# Patient Record
Sex: Male | Born: 1940 | Race: Black or African American | Hispanic: No | Smoking: Never smoker
Health system: Southern US, Community
[De-identification: ages and names within clinical notes are randomized; demographics above are authoritative.]

## PROBLEM LIST (undated history)

## (undated) DIAGNOSIS — M109 Gout, unspecified: Secondary | ICD-10-CM

## (undated) DIAGNOSIS — D649 Anemia, unspecified: Secondary | ICD-10-CM

## (undated) DIAGNOSIS — E785 Hyperlipidemia, unspecified: Secondary | ICD-10-CM

## (undated) DIAGNOSIS — F329 Major depressive disorder, single episode, unspecified: Secondary | ICD-10-CM

## (undated) DIAGNOSIS — K649 Unspecified hemorrhoids: Secondary | ICD-10-CM

## (undated) DIAGNOSIS — R9439 Abnormal result of other cardiovascular function study: Secondary | ICD-10-CM

## (undated) DIAGNOSIS — J069 Acute upper respiratory infection, unspecified: Secondary | ICD-10-CM

## (undated) DIAGNOSIS — M5412 Radiculopathy, cervical region: Secondary | ICD-10-CM

## (undated) DIAGNOSIS — R7302 Impaired glucose tolerance (oral): Secondary | ICD-10-CM

## (undated) DIAGNOSIS — F528 Other sexual dysfunction not due to a substance or known physiological condition: Secondary | ICD-10-CM

## (undated) DIAGNOSIS — Z86010 Personal history of colonic polyps: Secondary | ICD-10-CM

## (undated) DIAGNOSIS — G5600 Carpal tunnel syndrome, unspecified upper limb: Secondary | ICD-10-CM

## (undated) DIAGNOSIS — E538 Deficiency of other specified B group vitamins: Secondary | ICD-10-CM

## (undated) DIAGNOSIS — E876 Hypokalemia: Secondary | ICD-10-CM

## (undated) DIAGNOSIS — K219 Gastro-esophageal reflux disease without esophagitis: Secondary | ICD-10-CM

## (undated) DIAGNOSIS — J309 Allergic rhinitis, unspecified: Secondary | ICD-10-CM

## (undated) DIAGNOSIS — R0989 Other specified symptoms and signs involving the circulatory and respiratory systems: Secondary | ICD-10-CM

## (undated) DIAGNOSIS — R0602 Shortness of breath: Secondary | ICD-10-CM

## (undated) DIAGNOSIS — R079 Chest pain, unspecified: Secondary | ICD-10-CM

## (undated) DIAGNOSIS — M545 Low back pain: Secondary | ICD-10-CM

## (undated) DIAGNOSIS — I2510 Atherosclerotic heart disease of native coronary artery without angina pectoris: Secondary | ICD-10-CM

## (undated) DIAGNOSIS — I10 Essential (primary) hypertension: Secondary | ICD-10-CM

## (undated) DIAGNOSIS — M25519 Pain in unspecified shoulder: Secondary | ICD-10-CM

## (undated) DIAGNOSIS — E739 Lactose intolerance, unspecified: Secondary | ICD-10-CM

## (undated) DIAGNOSIS — R209 Unspecified disturbances of skin sensation: Secondary | ICD-10-CM

## (undated) DIAGNOSIS — N400 Enlarged prostate without lower urinary tract symptoms: Principal | ICD-10-CM

## (undated) HISTORY — DX: Deficiency of other specified B group vitamins: E53.8

## (undated) HISTORY — DX: Atherosclerotic heart disease of native coronary artery without angina pectoris: I25.10

## (undated) HISTORY — DX: Allergic rhinitis, unspecified: J30.9

## (undated) HISTORY — DX: Hyperlipidemia, unspecified: E78.5

## (undated) HISTORY — DX: Radiculopathy, cervical region: M54.12

## (undated) HISTORY — DX: Other sexual dysfunction not due to a substance or known physiological condition: F52.8

## (undated) HISTORY — PX: CORONARY STENT PLACEMENT: SHX1402

## (undated) HISTORY — PX: COLONOSCOPY: SHX174

## (undated) HISTORY — DX: Unspecified disturbances of skin sensation: R20.9

## (undated) HISTORY — DX: Hypokalemia: E87.6

## (undated) HISTORY — DX: Unspecified hemorrhoids: K64.9

## (undated) HISTORY — DX: Personal history of colonic polyps: Z86.010

## (undated) HISTORY — DX: Lactose intolerance, unspecified: E73.9

## (undated) HISTORY — DX: Gastro-esophageal reflux disease without esophagitis: K21.9

## (undated) HISTORY — DX: Pain in unspecified shoulder: M25.519

## (undated) HISTORY — DX: Low back pain: M54.5

## (undated) HISTORY — DX: Essential (primary) hypertension: I10

## (undated) HISTORY — DX: Gout, unspecified: M10.9

## (undated) HISTORY — DX: Acute upper respiratory infection, unspecified: J06.9

## (undated) HISTORY — DX: Shortness of breath: R06.02

## (undated) HISTORY — DX: Chest pain, unspecified: R07.9

## (undated) HISTORY — DX: Benign prostatic hyperplasia without lower urinary tract symptoms: N40.0

## (undated) HISTORY — DX: Major depressive disorder, single episode, unspecified: F32.9

## (undated) HISTORY — DX: Impaired glucose tolerance (oral): R73.02

## (undated) HISTORY — DX: Carpal tunnel syndrome, unspecified upper limb: G56.00

## (undated) HISTORY — DX: Anemia, unspecified: D64.9

## (undated) HISTORY — DX: Other specified symptoms and signs involving the circulatory and respiratory systems: R09.89

## (undated) HISTORY — DX: Abnormal result of other cardiovascular function study: R94.39

---

## 2004-05-12 ENCOUNTER — Other Ambulatory Visit: Payer: Self-pay

## 2005-05-05 ENCOUNTER — Ambulatory Visit: Payer: Self-pay | Admitting: Internal Medicine

## 2005-08-26 ENCOUNTER — Ambulatory Visit: Payer: Self-pay | Admitting: Internal Medicine

## 2005-12-10 ENCOUNTER — Ambulatory Visit: Payer: Self-pay | Admitting: Internal Medicine

## 2006-03-09 ENCOUNTER — Ambulatory Visit: Payer: Self-pay | Admitting: Internal Medicine

## 2006-09-23 ENCOUNTER — Ambulatory Visit: Payer: Self-pay | Admitting: Internal Medicine

## 2006-09-23 LAB — CONVERTED CEMR LAB
ALT: 22 U/L
AST: 24 U/L
Albumin: 3.6 g/dL
Alkaline Phosphatase: 63 U/L
BUN: 12 mg/dL
Basophils Absolute: 0.1 K/uL
Basophils Relative: 0.9 %
Bilirubin Urine: NEGATIVE
CO2: 31 meq/L
Calcium: 9.7 mg/dL
Chloride: 101 meq/L
Chol/HDL Ratio, serum: 6.6
Cholesterol: 248 mg/dL
Creatinine, Ser: 1.1 mg/dL
Crystals: NEGATIVE
Eosinophil percent: 2.3 %
GFR calc non Af Amer: 72 mL/min
Glomerular Filtration Rate, Af Am: 87 mL/min/1.73m2
Glucose, Bld: 90 mg/dL
HCT: 39.3 %
HDL: 37.5 mg/dL — ABNORMAL LOW
Hemoglobin, Urine: NEGATIVE
Hemoglobin: 13 g/dL
Ketones, ur: NEGATIVE mg/dL
LDL DIRECT: 123.4 mg/dL
Lymphocytes Relative: 42.2 %
MCHC: 33.1 g/dL
MCV: 90.1 fL
Monocytes Absolute: 0.5 K/uL
Monocytes Relative: 7.9 %
Mucus, UA: NEGATIVE
Neutro Abs: 2.8 K/uL
Neutrophils Relative %: 46.7 %
Nitrite: NEGATIVE
PSA: 0.35 ng/mL
Platelets: 234 K/uL
Potassium: 3.3 meq/L — ABNORMAL LOW
RBC: 4.36 M/uL
RDW: 12.8 %
Sodium: 139 meq/L
Specific Gravity, Urine: 1.025
TSH: 1.13 u[IU]/mL
Total Bilirubin: 1 mg/dL
Total Protein, Urine: NEGATIVE mg/dL
Total Protein: 7.3 g/dL
Triglyceride fasting, serum: 334 mg/dL
Urine Glucose: NEGATIVE mg/dL
Urobilinogen, UA: 0.2
VLDL: 67 mg/dL — ABNORMAL HIGH
WBC: 6.1 10*3/microliter
pH: 6

## 2006-11-29 ENCOUNTER — Ambulatory Visit: Payer: Self-pay | Admitting: Pulmonary Disease

## 2007-06-09 ENCOUNTER — Encounter: Payer: Self-pay | Admitting: Internal Medicine

## 2007-06-09 DIAGNOSIS — K649 Unspecified hemorrhoids: Secondary | ICD-10-CM | POA: Insufficient documentation

## 2007-06-09 DIAGNOSIS — I10 Essential (primary) hypertension: Secondary | ICD-10-CM

## 2007-06-09 DIAGNOSIS — K219 Gastro-esophageal reflux disease without esophagitis: Secondary | ICD-10-CM

## 2007-06-09 HISTORY — DX: Gastro-esophageal reflux disease without esophagitis: K21.9

## 2007-06-09 HISTORY — DX: Unspecified hemorrhoids: K64.9

## 2007-06-09 HISTORY — DX: Essential (primary) hypertension: I10

## 2007-06-12 DIAGNOSIS — M545 Low back pain, unspecified: Secondary | ICD-10-CM | POA: Insufficient documentation

## 2007-06-12 DIAGNOSIS — F329 Major depressive disorder, single episode, unspecified: Secondary | ICD-10-CM | POA: Insufficient documentation

## 2007-06-12 DIAGNOSIS — F3289 Other specified depressive episodes: Secondary | ICD-10-CM

## 2007-06-12 HISTORY — DX: Major depressive disorder, single episode, unspecified: F32.9

## 2007-06-12 HISTORY — DX: Other specified depressive episodes: F32.89

## 2007-06-12 HISTORY — DX: Low back pain, unspecified: M54.50

## 2008-02-07 ENCOUNTER — Ambulatory Visit: Payer: Self-pay | Admitting: Internal Medicine

## 2008-02-07 DIAGNOSIS — E785 Hyperlipidemia, unspecified: Secondary | ICD-10-CM

## 2008-02-07 HISTORY — DX: Hyperlipidemia, unspecified: E78.5

## 2008-02-07 LAB — CONVERTED CEMR LAB
ALT: 14 units/L (ref 0–53)
AST: 19 units/L (ref 0–37)
Albumin: 3.8 g/dL (ref 3.5–5.2)
Alkaline Phosphatase: 69 units/L (ref 39–117)
BUN: 13 mg/dL (ref 6–23)
CO2: 33 meq/L — ABNORMAL HIGH (ref 19–32)
Chloride: 105 meq/L (ref 96–112)
Eosinophils Absolute: 0.1 10*3/uL (ref 0.0–0.7)
Eosinophils Relative: 2.4 % (ref 0.0–5.0)
GFR calc non Af Amer: 79 mL/min
HDL: 23.7 mg/dL — ABNORMAL LOW (ref >39.0)
Leukocytes, UA: NEGATIVE
Lymphocytes Relative: 40.1 % (ref 12.0–46.0)
MCV: 89.2 fL (ref 78.0–100.0)
Monocytes Relative: 10.2 % (ref 3.0–12.0)
Neutrophils Relative %: 47 % (ref 43.0–77.0)
Nitrite: NEGATIVE
Platelets: 223 10*3/uL (ref 150–400)
Potassium: 3.6 meq/L (ref 3.5–5.1)
RBC: 4.4 M/uL (ref 4.22–5.81)
Specific Gravity, Urine: 1.02 (ref 1.000–1.03)
Total CHOL/HDL Ratio: 10.5
Urobilinogen, UA: 0.2 (ref 0.0–1.0)
VLDL: 36 mg/dL (ref 0–40)
WBC: 6.1 10*3/uL (ref 4.5–10.5)

## 2008-04-25 ENCOUNTER — Ambulatory Visit: Payer: Self-pay | Admitting: Internal Medicine

## 2008-04-25 DIAGNOSIS — R209 Unspecified disturbances of skin sensation: Secondary | ICD-10-CM

## 2008-04-25 DIAGNOSIS — M25519 Pain in unspecified shoulder: Secondary | ICD-10-CM

## 2008-04-25 HISTORY — DX: Unspecified disturbances of skin sensation: R20.9

## 2008-04-25 HISTORY — DX: Pain in unspecified shoulder: M25.519

## 2008-05-08 ENCOUNTER — Encounter: Payer: Self-pay | Admitting: Internal Medicine

## 2008-05-20 ENCOUNTER — Encounter: Payer: Self-pay | Admitting: Internal Medicine

## 2008-05-20 DIAGNOSIS — G5600 Carpal tunnel syndrome, unspecified upper limb: Secondary | ICD-10-CM | POA: Insufficient documentation

## 2008-05-20 HISTORY — DX: Carpal tunnel syndrome, unspecified upper limb: G56.00

## 2008-08-19 ENCOUNTER — Ambulatory Visit: Payer: Self-pay | Admitting: Internal Medicine

## 2008-08-19 DIAGNOSIS — J069 Acute upper respiratory infection, unspecified: Secondary | ICD-10-CM

## 2008-08-19 HISTORY — DX: Acute upper respiratory infection, unspecified: J06.9

## 2008-11-23 ENCOUNTER — Ambulatory Visit: Payer: Self-pay | Admitting: Family Medicine

## 2008-12-04 ENCOUNTER — Telehealth (INDEPENDENT_AMBULATORY_CARE_PROVIDER_SITE_OTHER): Payer: Self-pay | Admitting: *Deleted

## 2009-02-21 ENCOUNTER — Emergency Department: Payer: Self-pay | Admitting: Emergency Medicine

## 2009-02-24 ENCOUNTER — Ambulatory Visit: Payer: Self-pay | Admitting: Internal Medicine

## 2009-02-24 DIAGNOSIS — F528 Other sexual dysfunction not due to a substance or known physiological condition: Secondary | ICD-10-CM

## 2009-02-24 HISTORY — DX: Other sexual dysfunction not due to a substance or known physiological condition: F52.8

## 2009-02-25 LAB — CONVERTED CEMR LAB
ALT: 17 units/L (ref 0–53)
AST: 21 units/L (ref 0–37)
Bilirubin, Direct: 0.1 mg/dL (ref 0.0–0.3)
Direct LDL: 162.5 mg/dL
Total Bilirubin: 0.7 mg/dL (ref 0.3–1.2)

## 2009-02-28 ENCOUNTER — Emergency Department: Payer: Self-pay | Admitting: Emergency Medicine

## 2009-08-28 ENCOUNTER — Ambulatory Visit: Payer: Self-pay | Admitting: Internal Medicine

## 2009-08-29 LAB — CONVERTED CEMR LAB
Albumin: 4 g/dL (ref 3.5–5.2)
BUN: 9 mg/dL (ref 6–23)
Basophils Relative: 1.3 % (ref 0.0–3.0)
Bilirubin, Direct: 0 mg/dL (ref 0.0–0.3)
CO2: 32 meq/L (ref 19–32)
Chloride: 101 meq/L (ref 96–112)
Cholesterol: 255 mg/dL — ABNORMAL HIGH (ref 0–200)
Creatinine, Ser: 0.9 mg/dL (ref 0.4–1.5)
Direct LDL: 175.1 mg/dL
Eosinophils Absolute: 0.2 10*3/uL (ref 0.0–0.7)
HDL: 35.3 mg/dL — ABNORMAL LOW (ref >39.00)
Ketones, ur: NEGATIVE mg/dL
Lymphs Abs: 2.7 10*3/uL (ref 0.7–4.0)
MCHC: 33.5 g/dL (ref 30.0–36.0)
MCV: 92.1 fL (ref 78.0–100.0)
Monocytes Absolute: 0.4 10*3/uL (ref 0.1–1.0)
Neutrophils Relative %: 44 % (ref 43.0–77.0)
PSA: 0.53 ng/mL (ref 0.10–4.00)
RBC: 4.07 M/uL — ABNORMAL LOW (ref 4.22–5.81)
Specific Gravity, Urine: <=1.005 (ref 1.000–1.030)
Total Protein, Urine: NEGATIVE mg/dL
Total Protein: 7.6 g/dL (ref 6.0–8.3)
Triglycerides: 192 mg/dL — ABNORMAL HIGH (ref 0.0–149.0)
Urine Glucose: NEGATIVE mg/dL
pH: 6 (ref 5.0–8.0)

## 2009-09-26 ENCOUNTER — Ambulatory Visit: Payer: Self-pay | Admitting: Internal Medicine

## 2009-09-26 DIAGNOSIS — R079 Chest pain, unspecified: Secondary | ICD-10-CM

## 2009-09-26 HISTORY — DX: Chest pain, unspecified: R07.9

## 2009-10-06 ENCOUNTER — Telehealth (INDEPENDENT_AMBULATORY_CARE_PROVIDER_SITE_OTHER): Payer: Self-pay | Admitting: *Deleted

## 2009-10-07 ENCOUNTER — Ambulatory Visit: Payer: Self-pay

## 2009-10-07 ENCOUNTER — Ambulatory Visit: Payer: Self-pay | Admitting: Cardiovascular Disease

## 2009-10-07 ENCOUNTER — Encounter (HOSPITAL_COMMUNITY): Admission: RE | Admit: 2009-10-07 | Discharge: 2009-12-15 | Payer: Self-pay | Admitting: Internal Medicine

## 2009-10-08 ENCOUNTER — Telehealth: Payer: Self-pay | Admitting: Internal Medicine

## 2009-10-08 ENCOUNTER — Encounter: Payer: Self-pay | Admitting: Internal Medicine

## 2009-10-08 DIAGNOSIS — R9439 Abnormal result of other cardiovascular function study: Secondary | ICD-10-CM

## 2009-10-08 HISTORY — DX: Abnormal result of other cardiovascular function study: R94.39

## 2009-10-13 ENCOUNTER — Encounter (INDEPENDENT_AMBULATORY_CARE_PROVIDER_SITE_OTHER): Payer: Self-pay | Admitting: *Deleted

## 2009-10-23 ENCOUNTER — Encounter: Payer: Self-pay | Admitting: Cardiology

## 2009-10-23 ENCOUNTER — Ambulatory Visit: Payer: Self-pay | Admitting: Cardiovascular Disease

## 2009-10-24 LAB — CONVERTED CEMR LAB
Basophils Absolute: 0 10*3/uL (ref 0.0–0.1)
Basophils Relative: 0.4 % (ref 0.0–3.0)
CO2: 32 meq/L (ref 19–32)
Chloride: 99 meq/L (ref 96–112)
Eosinophils Absolute: 0.1 10*3/uL (ref 0.0–0.7)
Glucose, Bld: 112 mg/dL — ABNORMAL HIGH (ref 70–99)
HCT: 36.5 % — ABNORMAL LOW (ref 39.0–52.0)
Hemoglobin: 11.8 g/dL — ABNORMAL LOW (ref 13.0–17.0)
Lymphs Abs: 2.1 10*3/uL (ref 0.7–4.0)
MCHC: 32.3 g/dL (ref 30.0–36.0)
Monocytes Relative: 7.1 % (ref 3.0–12.0)
Neutro Abs: 2.9 10*3/uL (ref 1.4–7.7)
Potassium: 3.1 meq/L — ABNORMAL LOW (ref 3.5–5.1)
RBC: 3.93 M/uL — ABNORMAL LOW (ref 4.22–5.81)
RDW: 13.4 % (ref 11.5–14.6)
Sodium: 138 meq/L (ref 135–145)

## 2009-10-30 ENCOUNTER — Ambulatory Visit: Payer: Self-pay | Admitting: Cardiovascular Disease

## 2009-10-30 ENCOUNTER — Inpatient Hospital Stay (HOSPITAL_COMMUNITY): Admission: AD | Admit: 2009-10-30 | Discharge: 2009-10-31 | Payer: Self-pay | Admitting: Cardiovascular Disease

## 2009-11-04 ENCOUNTER — Telehealth: Payer: Self-pay | Admitting: Cardiovascular Disease

## 2009-11-18 ENCOUNTER — Ambulatory Visit: Payer: Self-pay | Admitting: Cardiovascular Disease

## 2009-11-18 DIAGNOSIS — R0989 Other specified symptoms and signs involving the circulatory and respiratory systems: Secondary | ICD-10-CM | POA: Insufficient documentation

## 2009-11-18 HISTORY — DX: Other specified symptoms and signs involving the circulatory and respiratory systems: R09.89

## 2009-11-20 ENCOUNTER — Ambulatory Visit: Payer: Self-pay

## 2009-11-20 ENCOUNTER — Encounter: Payer: Self-pay | Admitting: Cardiovascular Disease

## 2009-12-11 ENCOUNTER — Telehealth: Payer: Self-pay | Admitting: Cardiovascular Disease

## 2010-01-28 ENCOUNTER — Ambulatory Visit: Payer: Self-pay | Admitting: Internal Medicine

## 2010-01-28 DIAGNOSIS — D649 Anemia, unspecified: Secondary | ICD-10-CM | POA: Insufficient documentation

## 2010-01-28 DIAGNOSIS — E739 Lactose intolerance, unspecified: Secondary | ICD-10-CM | POA: Insufficient documentation

## 2010-01-28 DIAGNOSIS — E876 Hypokalemia: Secondary | ICD-10-CM | POA: Insufficient documentation

## 2010-01-28 HISTORY — DX: Hypokalemia: E87.6

## 2010-01-28 HISTORY — DX: Anemia, unspecified: D64.9

## 2010-01-28 HISTORY — DX: Lactose intolerance, unspecified: E73.9

## 2010-02-09 ENCOUNTER — Ambulatory Visit: Payer: Self-pay | Admitting: Internal Medicine

## 2010-02-09 DIAGNOSIS — E538 Deficiency of other specified B group vitamins: Secondary | ICD-10-CM

## 2010-02-09 HISTORY — DX: Deficiency of other specified B group vitamins: E53.8

## 2010-02-09 LAB — CONVERTED CEMR LAB
Basophils Absolute: 0 10*3/uL (ref 0.0–0.1)
Basophils Relative: 0.3 % (ref 0.0–3.0)
CO2: 33 meq/L — ABNORMAL HIGH (ref 19–32)
Calcium: 9.2 mg/dL (ref 8.4–10.5)
Chloride: 106 meq/L (ref 96–112)
Eosinophils Absolute: 0.1 10*3/uL (ref 0.0–0.7)
Folate: 10.8 ng/mL
Glucose, Bld: 108 mg/dL — ABNORMAL HIGH (ref 70–99)
HDL: 29.8 mg/dL — ABNORMAL LOW (ref >39.00)
Hemoglobin: 11.6 g/dL — ABNORMAL LOW (ref 13.0–17.0)
Hgb A1c MFr Bld: 5.9 % (ref 4.6–6.5)
Iron: 61 ug/dL (ref 42–165)
Lymphocytes Relative: 39.4 % (ref 12.0–46.0)
Lymphs Abs: 1.8 10*3/uL (ref 0.7–4.0)
MCHC: 34.2 g/dL (ref 30.0–36.0)
MCV: 88.4 fL (ref 78.0–100.0)
Monocytes Absolute: 0.4 10*3/uL (ref 0.1–1.0)
Neutro Abs: 2.3 10*3/uL (ref 1.4–7.7)
RBC: 3.83 M/uL — ABNORMAL LOW (ref 4.22–5.81)
RDW: 14.1 % (ref 11.5–14.6)
Sodium: 143 meq/L (ref 135–145)
Total CHOL/HDL Ratio: 5
Vitamin B-12: 169 pg/mL — ABNORMAL LOW (ref 211–911)

## 2010-02-10 ENCOUNTER — Ambulatory Visit: Payer: Self-pay | Admitting: Internal Medicine

## 2010-02-17 ENCOUNTER — Ambulatory Visit: Payer: Self-pay | Admitting: Cardiovascular Disease

## 2010-02-17 DIAGNOSIS — I2510 Atherosclerotic heart disease of native coronary artery without angina pectoris: Secondary | ICD-10-CM

## 2010-02-17 HISTORY — DX: Atherosclerotic heart disease of native coronary artery without angina pectoris: I25.10

## 2010-02-24 ENCOUNTER — Telehealth: Payer: Self-pay | Admitting: Cardiovascular Disease

## 2010-03-13 ENCOUNTER — Ambulatory Visit: Payer: Self-pay | Admitting: Internal Medicine

## 2010-03-30 ENCOUNTER — Ambulatory Visit: Payer: Self-pay | Admitting: Internal Medicine

## 2010-03-30 DIAGNOSIS — R509 Fever, unspecified: Secondary | ICD-10-CM | POA: Insufficient documentation

## 2010-03-30 LAB — CONVERTED CEMR LAB
Bilirubin Urine: NEGATIVE
Hemoglobin, Urine: NEGATIVE
Ketones, ur: NEGATIVE mg/dL
Leukocytes, UA: NEGATIVE
Nitrite: NEGATIVE
Urobilinogen, UA: 0.2 (ref 0.0–1.0)
pH: 5.5 (ref 5.0–8.0)

## 2010-03-31 ENCOUNTER — Encounter: Payer: Self-pay | Admitting: Internal Medicine

## 2010-05-12 ENCOUNTER — Encounter (INDEPENDENT_AMBULATORY_CARE_PROVIDER_SITE_OTHER): Payer: Self-pay | Admitting: *Deleted

## 2010-05-12 ENCOUNTER — Ambulatory Visit: Payer: Self-pay | Admitting: Cardiovascular Disease

## 2010-06-19 ENCOUNTER — Ambulatory Visit: Payer: Self-pay | Admitting: Internal Medicine

## 2010-07-24 ENCOUNTER — Ambulatory Visit: Payer: Self-pay | Admitting: Internal Medicine

## 2010-07-28 ENCOUNTER — Ambulatory Visit: Payer: Self-pay | Admitting: Internal Medicine

## 2010-07-28 DIAGNOSIS — R0602 Shortness of breath: Secondary | ICD-10-CM

## 2010-07-28 HISTORY — DX: Shortness of breath: R06.02

## 2010-07-28 LAB — CONVERTED CEMR LAB
ALT: 16 units/L (ref 0–53)
AST: 22 units/L (ref 0–37)
Albumin: 3.7 g/dL (ref 3.5–5.2)
BUN: 13 mg/dL (ref 6–23)
Chloride: 109 meq/L (ref 96–112)
Cholesterol: 184 mg/dL (ref 0–200)
Eosinophils Relative: 1.7 % (ref 0.0–5.0)
GFR calc non Af Amer: 91.17 mL/min (ref >60)
Glucose, Bld: 85 mg/dL (ref 70–99)
HCT: 35.4 % — ABNORMAL LOW (ref 39.0–52.0)
Hemoglobin: 12.1 g/dL — ABNORMAL LOW (ref 13.0–17.0)
Lymphs Abs: 2.4 10*3/uL (ref 0.7–4.0)
MCV: 90.6 fL (ref 78.0–100.0)
Monocytes Absolute: 0.4 10*3/uL (ref 0.1–1.0)
Monocytes Relative: 7.8 % (ref 3.0–12.0)
Neutro Abs: 2.5 10*3/uL (ref 1.4–7.7)
Nitrite: NEGATIVE
Potassium: 3.9 meq/L (ref 3.5–5.1)
RDW: 14.2 % (ref 11.5–14.6)
Sodium: 145 meq/L (ref 135–145)
Specific Gravity, Urine: 1.025 (ref 1.000–1.030)
Total Protein, Urine: NEGATIVE mg/dL
Triglycerides: 251 mg/dL — ABNORMAL HIGH (ref 0.0–149.0)
WBC: 5.4 10*3/uL (ref 4.5–10.5)
pH: 6 (ref 5.0–8.0)

## 2010-07-31 ENCOUNTER — Ambulatory Visit: Payer: Self-pay | Admitting: Internal Medicine

## 2010-08-03 ENCOUNTER — Encounter: Payer: Self-pay | Admitting: Internal Medicine

## 2010-11-08 LAB — CONVERTED CEMR LAB
BUN: 12 mg/dL (ref 6–23)
Bilirubin Urine: NEGATIVE
Calcium: 9.4 mg/dL (ref 8.4–10.5)
Cholesterol: 237 mg/dL (ref 0–200)
Creatinine, Ser: 1 mg/dL (ref 0.4–1.5)
GFR calc Af Amer: 96 mL/min
GFR calc non Af Amer: 79 mL/min
Ketones, ur: NEGATIVE mg/dL
Leukocytes, UA: NEGATIVE
Specific Gravity, Urine: >=1.03 (ref 1.000–1.03)
Total CHOL/HDL Ratio: 8.8
Triglycerides: 224 mg/dL (ref 0–149)
Urobilinogen, UA: 0.2 (ref 0.0–1.0)
VLDL: 45 mg/dL — ABNORMAL HIGH (ref 0–40)

## 2010-11-12 NOTE — Assessment & Plan Note (Signed)
Summary: eph/ gd   CC:  sob.  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  He has been compliant with his meds and not having any SSCP.  He has good LV function and no significant disease in his left system.  His cath went well and both the diagnostic and intervention were done from the right radial approach.  He needs some dental work with teeth extraction and I told him it would have to wait atleast 3 months as I dont want his Plavx stopped  Current Problems (verified): 1)  Carotid Bruit  (ICD-785.9) 2)  Hyperlipidemia  (ICD-272.4) 3)  Hypertension  (ICD-401.9) 4)  Myocardial Perfusion Scan, With Stress Test, Abnormal  (ICD-794.39) 5)  Chest Pain  (ICD-786.50) 6)  Erectile Dysfunction  (ICD-302.72) 7)  Hepatotoxicity, Drug-induced, Risk of  (ICD-V58.69) 8)  Uri  (ICD-465.9) 9)  Carpal Tunnel Syndrome, Left  (ICD-354.0) 10)  Paresthesia  (ICD-782.0) 11)  Shoulder Pain, Left  (ICD-719.41) 12)  Preventive Health Care  (ICD-V70.0) 13)  Depression  (ICD-311) 14)  Low Back Pain  (ICD-724.2) 15)  Hemorrhoids  (ICD-455.6) 16)  Gerd  (ICD-530.81)  Current Medications (verified): 1)  Aspirin 325 Mg  Tabs (Aspirin) .Marland Kitchen.. 1 Tab By Mouth Once Daily 2)  Klor-Con 10 10 Meq Cr-Tabs (Potassium Chloride) .Marland Kitchen.. 1 By Mouth Once Daily 3)  Simvastatin 40 Mg Tabs (Simvastatin) .Marland Kitchen.. 1 By Mouth Once Daily 4)  Plavix 75 Mg Tabs (Clopidogrel Bisulfate) .... Take One Tablet By Mouth Daily 5)  Pantoprazole Sodium 40 Mg Tbec (Pantoprazole Sodium) .Marland Kitchen.. 1 Tab By Mouth Once Daily 6)  Carvedilol 12.5 Mg Tabs (Carvedilol) .... Take One Tablet By Mouth Twice A Day  Allergies (verified): 1)  ! Voltaren  Past History:  Past Medical History: Last updated: 10/20/2009 Current Problems:  HYPERLIPIDEMIA (ICD-272.4) HYPERTENSION (ICD-401.9) MYOCARDIAL PERFUSION SCAN, WITH STRESS TEST, ABNORMAL (ICD-794.39) CHEST PAIN (ICD-786.50) ERECTILE  DYSFUNCTION (ICD-302.72) HEPATOTOXICITY, DRUG-INDUCED, RISK OF (ICD-V58.69) URI (ICD-465.9) CARPAL TUNNEL SYNDROME, LEFT (ICD-354.0) PARESTHESIA (ICD-782.0) SHOULDER PAIN, LEFT (ICD-719.41) PREVENTIVE HEALTH CARE (ICD-V70.0) DEPRESSION (ICD-311) LOW BACK PAIN (ICD-724.2) HEMORRHOIDS (ICD-455.6) GERD (ICD-530.81) E.D.   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 All other systems reviewed and negative  Vital Signs:  Patient profile:   70 year old male Height:      68 inches Weight:      186 pounds BMI:     28.38 Pulse rate:   52 / minute Resp:     14 per minute BP sitting:   147 / 84  (left arm)  Vitals Entered By: Kem Parkinson (November 18, 2009 3:22 PM)  Physical Exam  General:  Affect appropriate Healthy:  appears stated age HEENT: normal Neck supple with no adenopathy JVP normal right  bruits no thyromegaly Lungs clear with no wheezing and good diaphragmatic motion Heart:  S1/S2 no murmur,rub, gallop or click PMI normal Abdomen: benighn, BS positve, no tenderness, no AAA no bruit.  No HSM or HJR Distal pulses intact with no bruits No edema Neuro non-focal  Skin warm and dry    Impression & Recommendations:  Problem # 1:  CAROTID BRUIT (ICD-785.9) Duplex to R.O significant disease Orders: Carotid Duplex (Carotid Duplex)  Problem # 2:  HYPERLIPIDEMIA (ICD-272.4) Target LDL 70 or less Re check labs in 3 months His updated medication list for this problem includes:     Simvastatin 40 Mg Tabs (Simvastatin) .Marland Kitchen... 1 by mouth once daily  CHOL: 255 (08/28/2009)   LDL: DEL (08/19/2008)   HDL: 35.30 (08/28/2009)   TG: 192.0 (08/28/2009)  Problem # 3:  HYPERTENSION (ICD-401.9) Well controlled continue current meds The following medications were removed from the medication list:    Amlodipine Besylate 10 Mg Tabs (Amlodipine besylate) .Marland Kitchen... 1po once daily    Lisinopril-hydrochlorothiazide 20-12.5 Mg Tabs (Lisinopril-hydrochlorothiazide) .Marland Kitchen... 1 tab by mouth two times a day His updated medication list for this problem includes:    Aspirin 325 Mg Tabs (Aspirin) .Marland Kitchen... 1 tab by mouth once daily    Carvedilol 12.5 Mg Tabs (Carvedilol) .Marland Kitchen... Take one tablet by mouth twice a day  Problem # 4:  CHEST PAIN (ICD-786.50) Recent stent to the mid RCA for 99//5 lesion.  Plavix until atleast 10/31/2010 The following medications were removed from the medication list:    Amlodipine Besylate 10 Mg Tabs (Amlodipine besylate) .Marland Kitchen... 1po once daily    Lisinopril-hydrochlorothiazide 20-12.5 Mg Tabs (Lisinopril-hydrochlorothiazide) .Marland Kitchen... 1 tab by mouth two times a day His updated medication list for this problem includes:    Aspirin 325 Mg Tabs (Aspirin) .Marland Kitchen... 1 tab by mouth once daily    Plavix 75 Mg Tabs (Clopidogrel bisulfate) .Marland Kitchen... Take one tablet by mouth daily    Carvedilol 12.5 Mg Tabs (Carvedilol) .Marland Kitchen... Take one tablet by mouth twice a day  Patient Instructions: 1)  Your physician recommends that you schedule a follow-up appointment in: 3 MONTHS 2)  Your physician has requested that you have a carotid duplex. This test is an ultrasound of the carotid arteries in your neck. It looks at blood flow through these arteries that supply the brain with blood. Allow one hour for this exam. There are no restrictions or special instructions.

## 2010-11-12 NOTE — Progress Notes (Signed)
Summary: Medical Clearance  Phone Note From Other Clinic   Caller: Appointment Secretary [phone] Fax Summary of Call: Neysa Bonito at [facility] called requesting medical clearance for pt to have 3 teeth extracted. Initial call taken by: Margaret Pyle, CMA,  Feb 24, 2010 11:01 AM  Follow-up for Phone Call        I defer to dr Eden Emms who addressd this at the last visit with dr Eden Emms Follow-up by: Corwin Levins MD,  Feb 24, 2010 12:54 PM  Additional Follow-up for Phone Call Additional follow up Details #1::        dr Fabio Bering last office note faxed to above number Deliah Goody, RN  Feb 24, 2010 4:34 PM

## 2010-11-12 NOTE — Assessment & Plan Note (Signed)
Summary: np3/abn stress test   CC:  pt complains of right arm pain and also when he dosnt eat anything he gets a burning sensation in chest.  History of Present Illness: Travis Carey is seen today at the request of Dr Jonny Ruiz He has been having SSCP with exertion for about a year.  He has a distant history of cath more than 15 years ago and he said it was ok.  His pain sounds anginal with pain radiating to the arm and resolving with rest.  He has a myovue study which I read and reviewed with him.  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  Given his symptoms and positive myovue it is reasonable to proceed with cath.  The risks including hand ischemia, bleeding, stroke and need for emergency surgery were discussed and he is willing to proceed.  Current Problems (verified): 1)  Hyperlipidemia  (ICD-272.4) 2)  Hypertension  (ICD-401.9) 3)  Myocardial Perfusion Scan, With Stress Test, Abnormal  (ICD-794.39) 4)  Chest Pain  (ICD-786.50) 5)  Erectile Dysfunction  (ICD-302.72) 6)  Hepatotoxicity, Drug-induced, Risk of  (ICD-V58.69) 7)  Uri  (ICD-465.9) 8)  Carpal Tunnel Syndrome, Left  (ICD-354.0) 9)  Paresthesia  (ICD-782.0) 10)  Shoulder Pain, Left  (ICD-719.41) 11)  Preventive Health Care  (ICD-V70.0) 12)  Depression  (ICD-311) 13)  Low Back Pain  (ICD-724.2) 14)  Hemorrhoids  (ICD-455.6) 15)  Gerd  (ICD-530.81)  Current Medications (verified): 1)  Amlodipine Besylate 10 Mg Tabs (Amlodipine Besylate) .Marland Kitchen.. 1po Once Daily 2)  Lisinopril-Hydrochlorothiazide 20-12.5 Mg Tabs (Lisinopril-Hydrochlorothiazide) .Marland Kitchen.. 1 Tab By Mouth Two Times A Day 3)  Adult Aspirin Low Strength 81 Mg  Tbdp (Aspirin) .Marland Kitchen.. 1po Qd 4)  Diclofenac Sodium 75 Mg Tbec (Diclofenac Sodium) .Marland Kitchen.. 1 By Mouth Two Times A Day Prn 5)  Omeprazole 20 Mg Cpdr (Omeprazole) .Marland Kitchen.. 1 By Mouth Once Daily 6)  Klor-Con 10 10 Meq Cr-Tabs (Potassium Chloride) .Marland Kitchen.. 1 By Mouth Once Daily 7)  Simvastatin 40 Mg Tabs (Simvastatin)  .Marland Kitchen.. 1 By Mouth Once Daily 8)  Cephalexin 500 Mg Caps (Cephalexin) .Marland Kitchen.. 1po Three Times A Day  Allergies (verified): 1)  ! Voltaren  Past History:  Past Medical History: Last updated: 10/20/2009 Current Problems:  HYPERLIPIDEMIA (ICD-272.4) HYPERTENSION (ICD-401.9) MYOCARDIAL PERFUSION SCAN, WITH STRESS TEST, ABNORMAL (ICD-794.39) CHEST PAIN (ICD-786.50) ERECTILE DYSFUNCTION (ICD-302.72) HEPATOTOXICITY, DRUG-INDUCED, RISK OF (ICD-V58.69) URI (ICD-465.9) CARPAL TUNNEL SYNDROME, LEFT (ICD-354.0) PARESTHESIA (ICD-782.0) SHOULDER PAIN, LEFT (ICD-719.41) PREVENTIVE HEALTH CARE (ICD-V70.0) DEPRESSION (ICD-311) LOW BACK PAIN (ICD-724.2) HEMORRHOIDS (ICD-455.6) GERD (ICD-530.81) E.D.   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   Vital Signs:  Patient profile:   70 year old male Height:      68 inches Weight:      186 pounds BMI:     28.38 Pulse rate:   80 / minute Resp:     14 per minute BP sitting:   141 / 82  (left arm)  Vitals Entered By: Kem Parkinson (October 23, 2009 3:18 PM)  Physical Exam  General:  Affect appropriate Healthy:  appears stated age  HEENT: normal Neck supple with no adenopathy JVP normal no bruits no thyromegaly Lungs clear with no wheezing and good diaphragmatic motion Heart:  S1/S2 no murmur,rub, gallop or click PMI normal Abdomen: benighn, BS positve, no tenderness, no AAA no bruit.  No HSM or HJR Distal pulses intact with no bruits No edema Neuro  non-focal Skin warm and dry    Impression & Recommendations:  Problem # 1:  CHEST PAIN (ICD-786.50) Positive myovue concerning for angina  cath His updated medication list for this problem includes:    Amlodipine Besylate 10 Mg Tabs (Amlodipine besylate) .Marland Kitchen... 1po once daily    Lisinopril-hydrochlorothiazide 20-12.5 Mg Tabs (Lisinopril-hydrochlorothiazide) .Marland Kitchen... 1 tab by mouth two times a day    Adult Aspirin Low Strength 81 Mg Tbdp (Aspirin) .Marland Kitchen... 1po qd  Orders: TLB-BMP (Basic Metabolic Panel-BMET) (80048-METABOL) TLB-CBC Platelet - w/Differential (85025-CBCD) TLB-PTT (85730-PTTL) TLB-PT (Protime) (85610-PTP)  Problem # 2:  HYPERLIPIDEMIA (ICD-272.4) Continue statin inlight of probable CAD His updated medication list for this problem includes:    Simvastatin 40 Mg Tabs (Simvastatin) .Marland Kitchen... 1 by mouth once daily  Orders: TLB-BMP (Basic Metabolic Panel-BMET) (80048-METABOL) TLB-CBC Platelet - w/Differential (85025-CBCD) TLB-PTT (85730-PTTL) TLB-PT (Protime) (85610-PTP)  CHOL: 255 (08/28/2009)   LDL: DEL (08/19/2008)   HDL: 35.30 (08/28/2009)   TG: 192.0 (08/28/2009)  Problem # 3:  HYPERTENSION (ICD-401.9) WEll controlled continue low sodium diet His updated medication list for this problem includes:    Amlodipine Besylate 10 Mg Tabs (Amlodipine besylate) .Marland Kitchen... 1po once daily    Lisinopril-hydrochlorothiazide 20-12.5 Mg Tabs (Lisinopril-hydrochlorothiazide) .Marland Kitchen... 1 tab by mouth two times a day    Adult Aspirin Low Strength 81 Mg Tbdp (Aspirin) .Marland Kitchen... 1po qd  Orders: TLB-BMP (Basic Metabolic Panel-BMET) (80048-METABOL) TLB-CBC Platelet - w/Differential (85025-CBCD) TLB-PTT (85730-PTTL) TLB-PT (Protime) (85610-PTP)  Patient Instructions: 1)  Your physician recommends that you schedule a follow-up appointment after cardiac cath 10/30/2009 with Dr Eden Emms 2)  Your physician recommends that you continue on your current medications as directed. Please refer to the Current  Medication list given to you today. 3)  Your physician has requested that you have a cardiac catheterization.  Cardiac catheterization is used to diagnose and/or treat various heart conditions. Doctors may recommend this procedure for a number of different reasons. The most common reason is to evaluate chest pain. Chest pain can be a symptom of coronary artery disease (CAD), and cardiac catheterization can show whether plaque is narrowing or blocking your heart's arteries. This procedure is also used to evaluate the valves, as well as measure the blood flow and oxygen levels in different parts of your heart.  For further information please visit [email or web address]  Please follow instruction sheet, as given.

## 2010-11-12 NOTE — Miscellaneous (Signed)
  Clinical Lists Changes  Observations: Added new observation of PI CARDIO: Your physician recommends that you schedule a follow-up appointment in: YEAR WITH DR [facility] Your physician recommends that you continue on your current medications as directed. Please refer to the Current Medication list given to you today. (05/12/2010 16:37)      Patient Instructions: 1)  Your physician recommends that you schedule a follow-up appointment in: YEAR WITH DR Eden Emms 2)  Your physician recommends that you continue on your current medications as directed. Please refer to the Current Medication list given to you today.

## 2010-11-12 NOTE — Letter (Signed)
Summary: [facility] Consult Scheduled Letter  [facility]  [address] [place], [place] [address]   Phone: [phone]  Fax: [phone]      10/13/2009 MRN: [id number]  Travis Carey [address] [place], [place]  [address]    Dear Mr. Walle,      We have scheduled an appointment for you.  At the recommendation of Dr.Jones, we have scheduled you a consult with Nishan on January 13,2011 at 3:15 pm. Their phone number is [phone].If this appointment day and time is not convenient for you, please feel free to call the office of the doctor you are being referred to at the number listed above and reschedule the appointment.  Selena Batten- [address]rd Floor  [address] [place], [place] [address]  Thank you,  Patient Care Coordinator [facility]

## 2010-11-12 NOTE — Assessment & Plan Note (Signed)
Summary: b12/john/cd  Nurse Visit   Allergies: 1)  ! 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

## 2010-11-12 NOTE — Miscellaneous (Signed)
  Clinical Lists Changes  Problems: Added new problem of VITAMIN B12 DEFICIENCY (ICD-266.2) Medications: Added new medication of CYANOCOBALAMIN 1000 MCG/ML SOLN (CYANOCOBALAMIN) 1 cc IM q month

## 2010-11-12 NOTE — Progress Notes (Signed)
Summary: b/p high  Phone Note Call from Patient Call back at Home Phone [phone]   Caller: Patient Reason for Call: Talk to Nurse Details for Reason: Per pt calling. c/o b/p issue this am 193/103. x 3 - 4 days. meds was changed.  Initial call taken by: Lorne Skeens,  December 11, 2009 9:17 AM  Follow-up for Phone Call        spoke with pt, he states he is consistantly getting bp readings 193/103. he wanted to know about restarting amlodipine because it worked in the past. will discuss with dr Verdis Prime, RN  December 11, 2009 10:50 AM  discussed with dr Eden Emms, pt to restart lisinopril hctz 20/12.5mg  once daily. he will track his bp and let us know of any problems Deliah Goody, RN  December 11, 2009 12:50 PM     New/Updated Medications: LISINOPRIL-HYDROCHLOROTHIAZIDE 20-12.5 MG TABS (LISINOPRIL-HYDROCHLOROTHIAZIDE) ONE TABLET BY MOUTH ONCE DAILY Prescriptions: LISINOPRIL-HYDROCHLOROTHIAZIDE 20-12.5 MG TABS (LISINOPRIL-HYDROCHLOROTHIAZIDE) ONE TABLET BY MOUTH ONCE DAILY  #30 x 12   Entered by:   Deliah Goody, RN   Authorized by:   Colon Branch, MD, [facility]   Signed by:   Deliah Goody, RN on 12/11/2009   Method used:   Electronically to        [facility] [address] (retail)       [address]       [place], [place]  [address]       Ph: [phone]       Fax: [phone]   RxID:   [id number]

## 2010-11-12 NOTE — Assessment & Plan Note (Signed)
Summary: PAIN/ B-12 /NWS   Vital Signs:  Patient profile:   70 year old male Height:      68 inches Weight:      187.13 pounds BMI:     28.56 O2 Sat:      96 % on Room air Temp:     98.6 degrees F oral Pulse rate:   53 / minute BP sitting:   144 / 70  (left arm) Cuff size:   regular  Vitals Entered ByZella Ball Ewing (March 30, 2010 1:46 PM)  O2 Flow:  Room air CC: Back pain, B-12 shot, BP medication/RE   Primary Care Provider:  Dr. Jonny Ruiz  CC:  Back pain, B-12 shot, and BP medication/RE.  History of Present Illness: here with c/o 2 -3 days mild to mod right lower back and right side pain for , worse at night to lie down, overall improved in the past 2 days except for worsening this am with "feeling hot".  Not sure of fever.  Has been more constipated lately,  has nocturia x 1, but drinks some fluids before bed, and denies freq, dysuria, hematuria, but ? has some mild increasd urgency.  Occasionally urine appears more "gold" to urinate.  No hx of renal stone.  Pain to the back and right side worse to take deep breaths and lean backwards while sitting, as well as turning at the waist. No chills, recent wt loss, night sweats.    also mentions he has dental apt wed - plans to stop the plavix for until then.   Pt denies CP, sob, doe, wheezing, orthopnea, pnd, worsening LE edema, palps, dizziness or syncope Pt denies other new neuro symptoms such as headache, facial or extremity weakness   Problems Prior to Update: 1)  Fever Unspecified  (ICD-780.60) 2)  Cad  (ICD-414.00) 3)  Vitamin B12 Deficiency  (ICD-266.2) 4)  Glucose Intolerance  (ICD-271.3) 5)  Hypokalemia  (ICD-276.8) 6)  Anemia-nos  (ICD-285.9) 7)  Carotid Bruit  (ICD-785.9) 8)  Hyperlipidemia  (ICD-272.4) 9)  Hypertension  (ICD-401.9) 10)  Myocardial Perfusion Scan, With Stress Test, Abnormal  (ICD-794.39) 11)  Chest Pain  (ICD-786.50) 12)  Erectile Dysfunction  (ICD-302.72) 13)  Hepatotoxicity, Drug-induced, Risk of   (ICD-V58.69) 14)  Uri  (ICD-465.9) 15)  Carpal Tunnel Syndrome, Left  (ICD-354.0) 16)  Paresthesia  (ICD-782.0) 17)  Shoulder Pain, Left  (ICD-719.41) 18)  Preventive Health Care  (ICD-V70.0) 19)  Depression  (ICD-311) 20)  Low Back Pain  (ICD-724.2) 21)  Hemorrhoids  (ICD-455.6) 22)  Gerd  (ICD-530.81)  Medications Prior to Update: 1)  Aspirin 325 Mg  Tabs (Aspirin) .Marland Kitchen.. 1 Tab By Mouth Once Daily 2)  Klor-Con 10 10 Meq Cr-Tabs (Potassium Chloride) .Marland Kitchen.. 1 By Mouth Once Daily 3)  Simvastatin 40 Mg Tabs (Simvastatin) .Marland Kitchen.. 1 By Mouth Once Daily 4)  Plavix 75 Mg Tabs (Clopidogrel Bisulfate) .... Take One Tablet By Mouth Daily 5)  Pantoprazole Sodium 40 Mg Tbec (Pantoprazole Sodium) .Marland Kitchen.. 1 Tab By Mouth Once Daily 6)  Carvedilol 12.5 Mg Tabs (Carvedilol) .... Take One Tablet By Mouth Twice A Day 7)  Lisinopril-Hydrochlorothiazide 20-12.5 Mg Tabs (Lisinopril-Hydrochlorothiazide) .... 2 Tablet By Mouth Once Daily 8)  Cyanocobalamin 1000 Mcg/ml Soln (Cyanocobalamin) .Marland Kitchen.. 1 Cc Im Q Month 9)  Amlodipine Besylate 10 Mg Tabs (Amlodipine Besylate) .... Take One Tablet By Mouth Daily  Current Medications (verified): 1)  Aspirin 325 Mg  Tabs (Aspirin) .Marland Kitchen.. 1 Tab By Mouth Once Daily 2)  Klor-Con 10  10 Meq Cr-Tabs (Potassium Chloride) .Marland Kitchen.. 1 By Mouth Once Daily 3)  Simvastatin 40 Mg Tabs (Simvastatin) .Marland Kitchen.. 1 By Mouth Once Daily 4)  Plavix 75 Mg Tabs (Clopidogrel Bisulfate) .... Take One Tablet By Mouth Daily 5)  Pantoprazole Sodium 40 Mg Tbec (Pantoprazole Sodium) .Marland Kitchen.. 1 Tab By Mouth Once Daily 6)  Carvedilol 12.5 Mg Tabs (Carvedilol) .... Take One Tablet By Mouth Twice A Day 7)  Lisinopril-Hydrochlorothiazide 20-12.5 Mg Tabs (Lisinopril-Hydrochlorothiazide) .... 2 Tablet By Mouth Once Daily 8)  Cyanocobalamin 1000 Mcg/ml Soln (Cyanocobalamin) .Marland Kitchen.. 1 Cc Im Q Month 9)  Amlodipine Besylate 10 Mg Tabs (Amlodipine Besylate) .... Take One Tablet By Mouth Daily 10)  Tramadol Hcl 50 Mg Tabs (Tramadol Hcl)  .Marland Kitchen.. 1 By Mouth Q 6 Hrs As Needed Pain 11)  Flexeril 5 Mg Tabs (Cyclobenzaprine Hcl) .Marland Kitchen.. 1po Three Times A Day As Needed  Allergies (verified): 1)  ! Voltaren  Past History:  Past Medical History: Last updated: 01/28/2010 Current Problems:  HYPERLIPIDEMIA (ICD-272.4) HYPERTENSION (ICD-401.9) MYOCARDIAL PERFUSION SCAN, WITH STRESS TEST, ABNORMAL (ICD-794.39) CHEST PAIN (ICD-786.50) ERECTILE DYSFUNCTION (ICD-302.72) HEPATOTOXICITY, DRUG-INDUCED, RISK OF (ICD-V58.69) URI (ICD-465.9) CARPAL TUNNEL SYNDROME, LEFT (ICD-354.0) PARESTHESIA (ICD-782.0) SHOULDER PAIN, LEFT (ICD-719.41) PREVENTIVE HEALTH CARE (ICD-V70.0) DEPRESSION (ICD-311) LOW BACK PAIN (ICD-724.2) HEMORRHOIDS (ICD-455.6) GERD (ICD-530.81) E.D.  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   Head:  normocephalic and atraumatic.   Eyes:  vision grossly intact, pupils equal, and pupils round.   Ears:  R ear normal and L ear normal.   Nose:  no external deformity and no nasal discharge.   Mouth:  no gingival abnormalities and pharynx pink and moist.   Neck:  supple and no masses.   Lungs:  normal respiratory effort and normal breath sounds.   Heart:  normal rate and regular rhythm.   Msk:  no spine tender, has mod right lumbar paravertebral tender and spasm, no flank tender Extremities:  no edema, no erythema  Neurologic:  cranial nerves II-XII intact, strength normal in all lower extremities, and sensation intact to light touch.   Skin:  color normal and no rashes.   Psych:  not depressed appearing and slightly anxious.     Impression & Recommendations:  Problem # 1:  LOW BACK PAIN  (ICD-724.2)  His updated medication list for this problem includes:    Aspirin 325 Mg Tabs (Aspirin) .Marland Kitchen... 1 tab by mouth once daily    Tramadol Hcl 50 Mg Tabs (Tramadol hcl) .Marland Kitchen... 1 by mouth q 6 hrs as needed pain    Flexeril 5 Mg Tabs (Cyclobenzaprine hcl) .Marland Kitchen... 1po three times a day as needed c/w MSK strain most likley, possibly with underlying DJD and /or DDD; treat as above, f/u any worsening signs or symptoms   Problem # 2:  FEVER UNSPECIFIED (ICD-780.60) subjective - for urine studies as well  Orders: T-Culture, Urine ([id number]) TLB-Udip w/ Micro (81001-URINE)  Problem # 3:  HYPERTENSION (ICD-401.9)  His updated medication list for this problem includes:    Carvedilol 12.5 Mg Tabs (Carvedilol) .Marland Kitchen... Take one tablet by mouth twice a day    Lisinopril-hydrochlorothiazide 20-12.5 Mg Tabs (Lisinopril-hydrochlorothiazide) .Marland Kitchen... 2 tablet by mouth once daily  Amlodipine Besylate 10 Mg Tabs (Amlodipine besylate) .Marland Kitchen... Take one tablet by mouth daily  BP today: 144/70 Prior BP: 195/89 (02/17/2010)  Labs Reviewed: K+: 3.6 (02/09/2010) Creat: : 1.1 (02/09/2010)   Chol: 158 (02/09/2010)   HDL: 29.80 (02/09/2010)   LDL: 100 (02/09/2010)   TG: 142.0 (02/09/2010) improved, stable overall by hx and exam, ok to continue meds/tx as is   Complete Medication List: 1)  Aspirin 325 Mg Tabs (Aspirin) .Marland Kitchen.. 1 tab by mouth once daily 2)  Klor-con 10 10 Meq Cr-tabs (Potassium chloride) .Marland Kitchen.. 1 by mouth once daily 3)  Simvastatin 40 Mg Tabs (Simvastatin) .Marland Kitchen.. 1 by mouth once daily 4)  Plavix 75 Mg Tabs (Clopidogrel bisulfate) .... Take one tablet by mouth daily 5)  Pantoprazole Sodium 40 Mg Tbec (Pantoprazole sodium) .Marland Kitchen.. 1 tab by mouth once daily 6)  Carvedilol 12.5 Mg Tabs (Carvedilol) .... Take one tablet by mouth twice a day 7)  Lisinopril-hydrochlorothiazide 20-12.5 Mg Tabs (Lisinopril-hydrochlorothiazide) .... 2 tablet by mouth once daily 8)  Cyanocobalamin 1000 Mcg/ml Soln (Cyanocobalamin)  .Marland Kitchen.. 1 cc im q month 9)  Amlodipine Besylate 10 Mg Tabs (Amlodipine besylate) .... Take one tablet by mouth daily 10)  Tramadol Hcl 50 Mg Tabs (Tramadol hcl) .Marland Kitchen.. 1 by mouth q 6 hrs as needed pain 11)  Flexeril 5 Mg Tabs (Cyclobenzaprine hcl) .Marland Kitchen.. 1po three times a day as needed  Other Orders: Vit B12 1000 mcg ([id number]) Admin of Therapeutic Inj  intramuscular or subcutaneous (16109)  Patient Instructions: 1)  Please take all new medications as prescribed 2)  Continue all previous medications as before this visit  3)  Please go to the Lab in the basement for your blood and/or urine tests today  4)  Please schedule a follow-up appointment in 4 months with CPX labs Prescriptions: FLEXERIL 5 MG TABS (CYCLOBENZAPRINE HCL) 1po three times a day as needed  #60 x 1   Entered and Authorized by:   Corwin Levins MD   Signed by:   Corwin Levins MD on 03/30/2010   Method used:   Print then Give to Patient   RxID:   [id number] TRAMADOL HCL 50 MG TABS (TRAMADOL HCL) 1 by mouth q 6 hrs as needed pain  #60 x 1   Entered and Authorized by:   Corwin Levins MD   Signed by:   Corwin Levins MD on 03/30/2010   Method used:   Print then Give to Patient   RxID:   [id number]    Medication Administration  Injection # 1:    Medication: Vit B12 1000 mcg    Diagnosis: VITAMIN B12 DEFICIENCY (ICD-266.2)    Route: IM    Site: L deltoid    Exp Date: 01/2012    Lot #: [id number]    Mfr: [employer]    Given by: Zella Ball Ewing (March 30, 2010 1:47 PM)  Orders Added: 1)  Vit B12 1000 mcg [J3420] 2)  Admin of Therapeutic Inj  intramuscular or subcutaneous [96372] 3)  T-Culture, Urine [[id number]] 4)  TLB-Udip w/ Micro [81001-URINE] 5)  Est. Patient Level IV [53664]

## 2010-11-12 NOTE — Assessment & Plan Note (Signed)
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 Jonny Ruiz  CC:  check up.  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  He has been compliant with his meds and not having any SSCP.  He has good LV function and no significant disease in his left system.  His cath went well and both the diagnostic and intervention were done from the right radial approach. He has had his dental work done off Plavix with no problems.  Needs a refill on nitro in January.    Current Problems (verified): 1)  Fever Unspecified  (ICD-780.60) 2)  Cad  (ICD-414.00) 3)  Vitamin B12 Deficiency  (ICD-266.2) 4)  Glucose Intolerance  (ICD-271.3) 5)  Hypokalemia  (ICD-276.8) 6)  Anemia-nos  (ICD-285.9) 7)  Carotid Bruit  (ICD-785.9) 8)  Hyperlipidemia  (ICD-272.4) 9)  Hypertension  (ICD-401.9) 10)  Myocardial Perfusion Scan, With Stress Test, Abnormal  (ICD-794.39) 11)  Chest Pain  (ICD-786.50) 12)  Erectile Dysfunction  (ICD-302.72) 13)  Hepatotoxicity, Drug-induced, Risk of  (ICD-V58.69) 14)  Uri  (ICD-465.9) 15)  Carpal Tunnel Syndrome, Left  (ICD-354.0) 16)  Paresthesia  (ICD-782.0) 17)  Shoulder Pain, Left  (ICD-719.41) 18)  Preventive Health Care  (ICD-V70.0) 19)  Depression  (ICD-311) 20)  Low Back Pain  (ICD-724.2) 21)  Hemorrhoids  (ICD-455.6) 22)  Gerd  (ICD-530.81)  Current Medications (verified): 1)  Aspirin 325 Mg  Tabs (Aspirin) .Marland Kitchen.. 1 Tab By Mouth Once Daily 2)  Klor-Con 10 10 Meq Cr-Tabs (Potassium Chloride) .Marland Kitchen.. 1 By Mouth Once Daily 3)  Simvastatin 40 Mg Tabs (Simvastatin) .Marland Kitchen.. 1 By Mouth Once Daily 4)  Plavix 75 Mg Tabs (Clopidogrel Bisulfate) .... Take One Tablet By Mouth Daily 5)  Pantoprazole Sodium 40 Mg Tbec (Pantoprazole Sodium) .Marland Kitchen.. 1 Tab By Mouth Once Daily 6)  Carvedilol 12.5 Mg Tabs (Carvedilol) .... Take One Tablet By Mouth Twice A Day 7)  Lisinopril-Hydrochlorothiazide 20-12.5 Mg Tabs  (Lisinopril-Hydrochlorothiazide) .... As Needed 8)  Cyanocobalamin 1000 Mcg/ml Soln (Cyanocobalamin) .Marland Kitchen.. 1 Cc Im Q Month 9)  Amlodipine Besylate 10 Mg Tabs (Amlodipine Besylate) .... Take One Tablet By Mouth Daily 10)  Tramadol Hcl 50 Mg Tabs (Tramadol Hcl) .Marland Kitchen.. 1 By Mouth Q 6 Hrs As Needed Pain 11)  Flexeril 5 Mg Tabs (Cyclobenzaprine Hcl) .Marland Kitchen.. 1po Three Times A Day As Needed  Allergies (verified): 1)  ! Voltaren  Past History:  Past Medical History: Last updated: 01/28/2010 Current Problems:  HYPERLIPIDEMIA (ICD-272.4) HYPERTENSION (ICD-401.9) MYOCARDIAL PERFUSION SCAN, WITH STRESS TEST, ABNORMAL (ICD-794.39) CHEST PAIN (ICD-786.50) ERECTILE DYSFUNCTION (ICD-302.72) HEPATOTOXICITY, DRUG-INDUCED, RISK OF (ICD-V58.69) URI (ICD-465.9) CARPAL TUNNEL SYNDROME, LEFT (ICD-354.0) PARESTHESIA (ICD-782.0) SHOULDER PAIN, LEFT (ICD-719.41) PREVENTIVE HEALTH CARE (ICD-V70.0) DEPRESSION (ICD-311) LOW BACK PAIN (ICD-724.2) HEMORRHOIDS (ICD-455.6) GERD (ICD-530.81) E.D.  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   Vital Signs:  Patient profile:   70 year old male Height:      68 inches Weight:      186 pounds BMI:     28.38 Pulse rate:  68 / minute Resp:     14 per minute BP sitting:   136 / 86  (left arm)  Vitals Entered By: Kem Parkinson (May 12, 2010 4:20 PM)  Physical Exam  General:  Affect appropriate Healthy:  appears stated age HEENT: normal Neck supple with no  adenopathy JVP normal no bruits no thyromegaly Lungs clear with no wheezing and good diaphragmatic motion Heart:  S1/S2 no murmur,rub, gallop or click PMI normal Abdomen: benighn, BS positve, no tenderness, no AAA no bruit.  No HSM or HJR Distal pulses intact with no bruits No edema Neuro non-focal Skin warm and dry    Impression & Recommendations:  Problem # 1:  CAD (ICD-414.00) 1VD with stent RCA 1/11  no angina Continue ASA/Plavix and BB His updated medication list for this problem includes:    Aspirin 325 Mg Tabs (Aspirin) .Marland Kitchen... 1 tab by mouth once daily    Plavix 75 Mg Tabs (Clopidogrel bisulfate) .Marland Kitchen... Take one tablet by mouth daily    Carvedilol 12.5 Mg Tabs (Carvedilol) .Marland Kitchen... Take one tablet by mouth twice a day    Lisinopril-hydrochlorothiazide 20-12.5 Mg Tabs (Lisinopril-hydrochlorothiazide) .Marland Kitchen... As needed    Amlodipine Besylate 10 Mg Tabs (Amlodipine besylate) .Marland Kitchen... Take one tablet by mouth daily  Problem # 2:  HYPERTENSION (ICD-401.9) Well controlled continue low sodium diet His updated medication list for this problem includes:    Aspirin 325 Mg Tabs (Aspirin) .Marland Kitchen... 1 tab by mouth once daily    Carvedilol 12.5 Mg Tabs (Carvedilol) .Marland Kitchen... Take one tablet by mouth twice a day    Lisinopril-hydrochlorothiazide 20-12.5 Mg Tabs (Lisinopril-hydrochlorothiazide) .Marland Kitchen... As needed    Amlodipine Besylate 10 Mg Tabs (Amlodipine besylate) .Marland Kitchen... Take one tablet by mouth daily  Problem # 3:  HYPERLIPIDEMIA (ICD-272.4) At goal with no side effects His updated medication list for this problem includes:    Simvastatin 40 Mg Tabs (Simvastatin) .Marland Kitchen... 1 by mouth once daily  CHOL: 158 (02/09/2010)   LDL: 100 (02/09/2010)   HDL: 29.80 (02/09/2010)   TG: 142.0 (02/09/2010)

## 2010-11-12 NOTE — Assessment & Plan Note (Signed)
Summary: 6 mos f/u // CD  RS'D PT WANTED TO COME SOONER/NWS   Vital Signs:  Patient profile:   70 year old male Height:      68 inches Weight:      180.13 pounds BMI:     27.49 O2 Sat:      96 % on Room air Temp:     97 degrees F oral Pulse rate:   52 / minute BP sitting:   182 / 90  (left arm) Cuff size:   regular  Vitals Entered ByZella Ball Ewing (January 28, 2010 9:21 AM)  O2 Flow:  Room air CC: 6 Month Followup/RE   CC:  6 Month Followup/RE.  History of Present Illness: overall doing well;  Pt denies CP, sob, doe, wheezing, orthopnea, pnd, worsening LE edema, palps, dizziness or syncope  Pt denies new neuro symptoms such as headache, facial or extremity weakness   Pt denies polydipsia, polyuria, or low sugar symptoms such as shakiness improved with eating.  Overall good compliance with meds, trying to follow low salt and chol diet, wt stable, little excercise however . BP has been  elevated at home  (take twice per day) - often to the 160 to 180 range.    Problems Prior to Update: 1)  Carotid Bruit  (ICD-785.9) 2)  Hyperlipidemia  (ICD-272.4) 3)  Hypertension  (ICD-401.9) 4)  Myocardial Perfusion Scan, With Stress Test, Abnormal  (ICD-794.39) 5)  Chest Pain  (ICD-786.50) 6)  Erectile Dysfunction  (ICD-302.72) 7)  Hepatotoxicity, Drug-induced, Risk of  (ICD-V58.69) 8)  Uri  (ICD-465.9) 9)  Carpal Tunnel Syndrome, Left  (ICD-354.0) 10)  Paresthesia  (ICD-782.0) 11)  Shoulder Pain, Left  (ICD-719.41) 12)  Preventive Health Care  (ICD-V70.0) 13)  Depression  (ICD-311) 14)  Low Back Pain  (ICD-724.2) 15)  Hemorrhoids  (ICD-455.6) 16)  Gerd  (ICD-530.81)  Medications Prior to Update: 1)  Aspirin 325 Mg  Tabs (Aspirin) .Marland Kitchen.. 1 Tab By Mouth Once Daily 2)  Klor-Con 10 10 Meq Cr-Tabs (Potassium Chloride) .Marland Kitchen.. 1 By Mouth Once Daily 3)  Simvastatin 40 Mg Tabs (Simvastatin) .Marland Kitchen.. 1 By Mouth Once Daily 4)  Plavix 75 Mg Tabs (Clopidogrel Bisulfate) .... Take One Tablet By Mouth  Daily 5)  Pantoprazole Sodium 40 Mg Tbec (Pantoprazole Sodium) .Marland Kitchen.. 1 Tab By Mouth Once Daily 6)  Carvedilol 12.5 Mg Tabs (Carvedilol) .... Take One Tablet By Mouth Twice A Day 7)  Lisinopril-Hydrochlorothiazide 20-12.5 Mg Tabs (Lisinopril-Hydrochlorothiazide) .... One Tablet By Mouth Once Daily  Current Medications (verified): 1)  Aspirin 325 Mg  Tabs (Aspirin) .Marland Kitchen.. 1 Tab By Mouth Once Daily 2)  Klor-Con 10 10 Meq Cr-Tabs (Potassium Chloride) .Marland Kitchen.. 1 By Mouth Once Daily 3)  Simvastatin 40 Mg Tabs (Simvastatin) .Marland Kitchen.. 1 By Mouth Once Daily 4)  Plavix 75 Mg Tabs (Clopidogrel Bisulfate) .... Take One Tablet By Mouth Daily 5)  Pantoprazole Sodium 40 Mg Tbec (Pantoprazole Sodium) .Marland Kitchen.. 1 Tab By Mouth Once Daily 6)  Carvedilol 12.5 Mg Tabs (Carvedilol) .... Take One Tablet By Mouth Twice A Day 7)  Lisinopril-Hydrochlorothiazide 20-12.5 Mg Tabs (Lisinopril-Hydrochlorothiazide) .... 2 Tablet By Mouth Once Daily  Allergies (verified): 1)  ! 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  Anemia-NOS  Review of Systems       all otherwise negative per pt -    Physical Exam  General:  alert and well-developed.   Head:  normocephalic and atraumatic.   Eyes:  vision  grossly intact, pupils equal, and pupils round.   Ears:  R ear normal and L ear normal.   Nose:  no external deformity and no nasal discharge.   Mouth:  no gingival abnormalities and pharynx pink and moist.   Neck:  supple and no masses.   Lungs:  normal respiratory effort and normal breath sounds.   Heart:  normal rate and regular rhythm.   Abdomen:  soft and non-tender.  , no bruits Extremities:  no edema, no erythema    Impression & Recommendations:  Problem # 1:  HYPERTENSION (ICD-401.9)  His updated medication list for this problem includes:    Carvedilol 12.5 Mg Tabs (Carvedilol) .Marland Kitchen... Take one tablet by mouth twice a day    Lisinopril-hydrochlorothiazide 20-12.5 Mg Tabs (Lisinopril-hydrochlorothiazide) .Marland Kitchen... 2 tablet by mouth once daily to increase the lis/hct to 2 per day (one copay) - max dosing; cont to monitor BP at home as he does;  to f/u labs in 10 days;  ans incidently has f/u appt with Dr Eden Emms may 2011;  overall f/u here 6 mo  Problem # 2:  HYPOKALEMIA (ICD-276.8) to re-check with next labs; asympt it seems without significant fatigue, cramps  Problem # 3:  ANEMIA-NOS (ICD-285.9) very mild jan 2011 - ? clinical significance  - for f/u with next labs  Problem # 4:  GLUCOSE INTOLERANCE (ICD-271.3) very mild jan 2011 - ?  clincal significance - for a1c f/u with next labs  Complete Medication List: 1)  Aspirin 325 Mg Tabs (Aspirin) .Marland Kitchen.. 1 tab by mouth once daily 2)  Klor-con 10 10 Meq Cr-tabs (Potassium chloride) .Marland Kitchen.. 1 by mouth once daily 3)  Simvastatin 40 Mg Tabs (Simvastatin) .Marland Kitchen.. 1 by mouth once daily 4)  Plavix 75 Mg Tabs (Clopidogrel bisulfate) .... Take one tablet by mouth daily 5)  Pantoprazole Sodium 40 Mg Tbec (Pantoprazole sodium) .Marland Kitchen.. 1 tab by mouth once daily 6)  Carvedilol 12.5 Mg Tabs (Carvedilol) .... Take one tablet by mouth twice a day 7)  Lisinopril-hydrochlorothiazide 20-12.5 Mg Tabs (Lisinopril-hydrochlorothiazide) .... 2 tablet by mouth once  daily  Patient Instructions: 1)  increase the Lisinopril-HCT 20/12.5 - 2 per day 2)  please return on Friday Apr 29 for LAB only: 3)  BMP prior to visit, ICD-9: 401.1 4)  CBC w/ Diff prior to visit, ICD-9: 285.9 5)  HbgA1C prior to visit, ICD-9: 790.2 6)  iron panel  285.9 7)  b12/folate 295.9 8)  Lipid Panel prior to visit, ICD-9: 272.0 9)  please keep your appt with Dr Marjorie Smolder as planned 10)  Please schedule a follow-up appointment in 6 months (or sooner if needed) with CPX labs Prescriptions: LISINOPRIL-HYDROCHLOROTHIAZIDE 20-12.5 MG TABS (LISINOPRIL-HYDROCHLOROTHIAZIDE) 2 TABLET BY MOUTH ONCE DAILY  #180 x 3   Entered and Authorized by:   Corwin Levins MD   Signed by:   Corwin Levins MD on 01/28/2010   Method used:   Print then Give to Patient   RxID:   [id number]

## 2010-11-12 NOTE — Assessment & Plan Note (Signed)
Summary: 3 month rov.sl   Primary Provider:  Dr. Jonny Ruiz  CC:  sob.  History of Present Illness: Travis Carey is seen todya for F/U of CAD.  He had a stent to the RCA in 1/11.  He still needs dental work which can probably be done in June off Plavix.  He is on good BP meds.  His carotid duplex for bruit showed no significant stenosis.  He is not having anhy SSCP, palpitaitons, dsypnea or syncope.  he has been compliant with this meds  Current Problems (verified): 1)  Cad  (ICD-414.00) 2)  Vitamin B12 Deficiency  (ICD-266.2) 3)  Glucose Intolerance  (ICD-271.3) 4)  Hypokalemia  (ICD-276.8) 5)  Anemia-nos  (ICD-285.9) 6)  Carotid Bruit  (ICD-785.9) 7)  Hyperlipidemia  (ICD-272.4) 8)  Hypertension  (ICD-401.9) 9)  Myocardial Perfusion Scan, With Stress Test, Abnormal  (ICD-794.39) 10)  Chest Pain  (ICD-786.50) 11)  Erectile Dysfunction  (ICD-302.72) 12)  Hepatotoxicity, Drug-induced, Risk of  (ICD-V58.69) 13)  Uri  (ICD-465.9) 14)  Carpal Tunnel Syndrome, Left  (ICD-354.0) 15)  Paresthesia  (ICD-782.0) 16)  Shoulder Pain, Left  (ICD-719.41) 17)  Preventive Health Care  (ICD-V70.0) 18)  Depression  (ICD-311) 19)  Low Back Pain  (ICD-724.2) 20)  Hemorrhoids  (ICD-455.6) 21)  Gerd  (ICD-530.81)  Current Medications (verified): 1)  Aspirin 325 Mg  Tabs (Aspirin) .Marland Kitchen.. 1 Tab By Mouth Once Daily 2)  Klor-Con 10 10 Meq Cr-Tabs (Potassium Chloride) .Marland Kitchen.. 1 By Mouth Once Daily 3)  Simvastatin 40 Mg Tabs (Simvastatin) .Marland Kitchen.. 1 By Mouth Once Daily 4)  Plavix 75 Mg Tabs (Clopidogrel Bisulfate) .... Take One Tablet By Mouth Daily 5)  Pantoprazole Sodium 40 Mg Tbec (Pantoprazole Sodium) .Marland Kitchen.. 1 Tab By Mouth Once Daily 6)  Carvedilol 12.5 Mg Tabs (Carvedilol) .... Take One Tablet By Mouth Twice A Day 7)  Lisinopril-Hydrochlorothiazide 20-12.5 Mg Tabs (Lisinopril-Hydrochlorothiazide) .... 2 Tablet By Mouth Once Daily 8)  Cyanocobalamin 1000 Mcg/ml Soln (Cyanocobalamin) .Marland Kitchen.. 1 Cc Im Q Month 9)  Amlodipine  Besylate 10 Mg Tabs (Amlodipine Besylate) .... Take One Tablet By Mouth Daily  Allergies (verified): 1)  ! Voltaren  Past History:  Past Medical History: Last updated: 01/28/2010 Current Problems:  HYPERLIPIDEMIA (ICD-272.4) HYPERTENSION (ICD-401.9) MYOCARDIAL PERFUSION SCAN, WITH STRESS TEST, ABNORMAL (ICD-794.39) CHEST PAIN (ICD-786.50) ERECTILE DYSFUNCTION (ICD-302.72) HEPATOTOXICITY, DRUG-INDUCED, RISK OF (ICD-V58.69) URI (ICD-465.9) CARPAL TUNNEL SYNDROME, LEFT (ICD-354.0) PARESTHESIA (ICD-782.0) SHOULDER PAIN, LEFT (ICD-719.41) PREVENTIVE HEALTH CARE (ICD-V70.0) DEPRESSION (ICD-311) LOW BACK PAIN (ICD-724.2) HEMORRHOIDS (ICD-455.6) GERD (ICD-530.81) E.D.  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   Vital Signs:  Patient profile:   70 year old male Height:      68 inches Weight:      182 pounds BMI:     27.77 Pulse rate:   63 / minute Resp:     14 per minute BP sitting:   195 / 89  (left arm)  Vitals Entered By: Kem Parkinson (Feb 17, 2010 1:36 PM)  Physical Exam  General:  Affect appropriate Healthy:  appears stated age HEENT: normal Neck supple with  no adenopathy JVP normal no bruits no thyromegaly Lungs clear with no wheezing and good diaphragmatic motion Heart:  S1/S2 no murmur,rub, gallop or click PMI normal Abdomen: benighn, BS positve, no tenderness, no AAA no bruit.  No HSM or HJR Distal pulses intact with no bruits No edema Neuro  non-focal Skin warm and dry    Impression & Recommendations:  Problem # 1:  HYPERLIPIDEMIA (ICD-272.4) At target with no side effects His updated medication list for this problem includes:    Simvastatin 40 Mg Tabs (Simvastatin) .Marland Kitchen... 1 by mouth once daily  CHOL: 158 (02/09/2010)   LDL: 100 (02/09/2010)   HDL: 29.80 (02/09/2010)   TG: 142.0 (02/09/2010)  Problem # 2:  HYPERTENSION (ICD-401.9) Add norvasc.  Continue to monitor at home.  F/U me in 3 months His updated medication list for this problem includes:    Aspirin 325 Mg Tabs (Aspirin) .Marland Kitchen... 1 tab by mouth once daily    Carvedilol 12.5 Mg Tabs (Carvedilol) .Marland Kitchen... Take one tablet by mouth twice a day    Lisinopril-hydrochlorothiazide 20-12.5 Mg Tabs (Lisinopril-hydrochlorothiazide) .Marland Kitchen... 2 tablet by mouth once daily    Amlodipine Besylate 10 Mg Tabs (Amlodipine besylate) .Marland Kitchen... Take one tablet by mouth daily  His updated medication list for this problem includes:    Aspirin 325 Mg Tabs (Aspirin) .Marland Kitchen... 1 tab by mouth once daily    Carvedilol 12.5 Mg Tabs (Carvedilol) .Marland Kitchen... Take one tablet by mouth twice a day    Lisinopril-hydrochlorothiazide 20-12.5 Mg Tabs (Lisinopril-hydrochlorothiazide) .Marland Kitchen... 2 tablet by mouth once daily    Amlodipine Besylate 10 Mg Tabs (Amlodipine besylate) .Marland Kitchen... Take one tablet by mouth daily  Problem # 3:  CAROTID BRUIT (ICD-785.9) No significant disease on duplex.  Not heard on todays exam  Problem # 4:  CAD (ICD-414.00) Stent to RCA 1/11.  Continue Plavix.  Ok to have dental work and hold Plavix in June His updated medication list for this problem includes:    Aspirin 325 Mg Tabs (Aspirin) .Marland Kitchen... 1 tab by mouth once daily    Plavix 75 Mg Tabs (Clopidogrel bisulfate) .Marland Kitchen... Take one tablet by mouth daily    Carvedilol 12.5 Mg Tabs (Carvedilol) .Marland Kitchen... Take one tablet by mouth twice a day    Lisinopril-hydrochlorothiazide 20-12.5 Mg Tabs (Lisinopril-hydrochlorothiazide) .Marland Kitchen... 2 tablet by mouth once daily     Amlodipine Besylate 10 Mg Tabs (Amlodipine besylate) .Marland Kitchen... Take one tablet by mouth daily  Patient Instructions: 1)  Your physician recommends that you schedule a follow-up appointment in: 3 MONTHS 2)  Your physician has recommended you make the following change in your medication: START AMLODIPINE 10MG  ONCE DAILY Prescriptions: AMLODIPINE BESYLATE 10 MG TABS (AMLODIPINE BESYLATE) Take one tablet by mouth daily  #30 x 12   Entered by:   Deliah Goody, RN   Authorized by:   Colon Branch, MD, [facility]   Signed by:   Deliah Goody, RN on 02/17/2010   Method used:   Electronically to        [facility] [address] (retail)       [address]       [place], [place]  [address]       Ph: [phone]       Fax: [phone]   RxID:   [id number]

## 2010-11-12 NOTE — Progress Notes (Signed)
Summary: Dentist?  Phone Note From Other Clinic   Caller: [facility] [phone] Jan Summary of Call: Dentist office called stating that pt is going to be scheduled tohave 3 remaining teeth extracted. office is requesting advisement from MD if pt should stop Plavix and Aspirin before procedure and if so, how long. please advise. Initial call taken by: Margaret Pyle, CMA,  November 04, 2009 11:27 AM  Follow-up for Phone Call        I defer this question to dr Mahagony Grieb  my sense is that he should not stop the meds due to the recent coronary procedure Follow-up by: Corwin Levins MD,  November 04, 2009 12:52 PM  Additional Follow-up for Phone Call Additional follow up Details #1::        Dr Jonny Ruiz is right.Sammuel Bailiff just had stent placed and cannot stop ASA or plavix at this time Additional Follow-up by: Norva Karvonen, MD,  November 04, 2009 5:36 PM     Appended Document: Dentist? [facility] - Irva Loser @ dentist office infomed. she states she will have pt contact [facility] directly to get an estimate as to when he may be able to stop medications.

## 2010-11-12 NOTE — Cardiovascular Report (Signed)
Summary: Pre Cath/Percutaneous Orders  Pre Cath/Percutaneous Orders   Imported By: Roderic Ovens 11/11/2009 16:20:05  _____________________________________________________________________  External Attachment:    Type:   Image     Comment:   External Document

## 2010-11-12 NOTE — Miscellaneous (Signed)
Summary: Orders Update pft charges  Clinical Lists Changes  Orders: Added new Service order of Carbon Monoxide diffusing w/capacity (94720) - Signed Added new Service order of Lung Volumes (94240) - Signed Added new Service order of Spirometry (Pre & Post) (94060) - Signed 

## 2010-11-12 NOTE — Assessment & Plan Note (Signed)
Summary: b12---jwj--per robin sched--stc  Nurse Visit  CC: Pt came for B-12 inj, given Right deltoid./kb   Allergies: 1)  ! Voltaren  Medication Administration  Injection # 1:    Medication: Vit B12 1000 mcg    Diagnosis: VITAMIN B12 DEFICIENCY (ICD-266.2)    Route: IM    Site: R deltoid    Exp Date: 10/12/2011    Lot #: [id number]    Mfr: [employer] Regent    Patient tolerated injection without complications    Given by: Lucious Groves (Feb 10, 2010 3:20 PM)  Orders Added: 1)  Admin of Therapeutic Inj  intramuscular or subcutaneous [96372] 2)  Vit B12 1000 mcg [J3420]

## 2010-11-12 NOTE — Assessment & Plan Note (Signed)
Summary: 4 mos f/u #/cd   Vital Signs:  Patient profile:   70 year old male Height:      68 inches Weight:      191.38 pounds BMI:     29.20 O2 Sat:      96 % on Room air Temp:     98.6 degrees F oral Pulse rate:   57 / minute BP sitting:   160 / 80  (left arm) Cuff size:   regular  Vitals Entered By: Zella Ball Ewing CMA Duncan Dull) (July 28, 2010 1:31 PM)  O2 Flow:  Room air  CC: 4 month followup/RE   Primary Care Provider:  Dr. Jonny Ruiz  CC:  4 month followup/RE.  History of Present Illness: here for wellness and f/u  - BP up and down, the lis-hct prn works but often too strong when he takes it resulting in too low BP < 100 and dizziness, weakness;    saw card recently - stable overall;  Pt denies CP, wheezing, orthopnea, pnd, worsening LE edema, palps, dizziness or syncope  Pt denies new neuro symptoms such as headache, facial or extremity weakness  No fever, wt loss, night sweats, loss of appetite or other constitutional symptoms .  Pt denies polydipsia, polyuria.  Overall good compliance with meds, trying to follow low chol  diet, wt stable, little excercise however .  Not taking the PPI with worsening reflux in the AM, withotu dysphagia, n/v, abd pain, bowel change or blood.  Due for b12 today. Has occasional constipation for which he takes stool softner.Has mild sob/doe but no other assoc symptoms as above.  No cough., wheezing, and sob is not funcitonally limiting.    Preventive Screening-Counseling & Management      Drug Use:  no.    Problems Prior to Update: 1)  Dyspnea  (ICD-786.05) 2)  Carpal Tunnel Syndrome, Bilateral  (ICD-354.0) 3)  Fever Unspecified  (ICD-780.60) 4)  Cad  (ICD-414.00) 5)  Vitamin B12 Deficiency  (ICD-266.2) 6)  Glucose Intolerance  (ICD-271.3) 7)  Hypokalemia  (ICD-276.8) 8)  Anemia-nos  (ICD-285.9) 9)  Carotid Bruit  (ICD-785.9) 10)  Hyperlipidemia  (ICD-272.4) 11)  Hypertension  (ICD-401.9) 12)  Myocardial Perfusion Scan, With Stress Test,  Abnormal  (ICD-794.39) 13)  Chest Pain  (ICD-786.50) 14)  Erectile Dysfunction  (ICD-302.72) 15)  Hepatotoxicity, Drug-induced, Risk of  (ICD-V58.69) 16)  Uri  (ICD-465.9) 17)  Carpal Tunnel Syndrome, Left  (ICD-354.0) 18)  Paresthesia  (ICD-782.0) 19)  Shoulder Pain, Left  (ICD-719.41) 20)  Preventive Health Care  (ICD-V70.0) 21)  Depression  (ICD-311) 22)  Low Back Pain  (ICD-724.2) 23)  Hemorrhoids  (ICD-455.6) 24)  Gerd  (ICD-530.81)  Medications Prior to Update: 1)  Aspirin 325 Mg  Tabs (Aspirin) .Marland Kitchen.. 1 Tab By Mouth Once Daily 2)  Klor-Con 10 10 Meq Cr-Tabs (Potassium Chloride) .Marland Kitchen.. 1 By Mouth Once Daily 3)  Simvastatin 40 Mg Tabs (Simvastatin) .Marland Kitchen.. 1 By Mouth Once Daily 4)  Plavix 75 Mg Tabs (Clopidogrel Bisulfate) .... Take One Tablet By Mouth Daily 5)  Pantoprazole Sodium 40 Mg Tbec (Pantoprazole Sodium) .Marland Kitchen.. 1 Tab By Mouth Once Daily 6)  Carvedilol 12.5 Mg Tabs (Carvedilol) .... Take One Tablet By Mouth Twice A Day 7)  Lisinopril-Hydrochlorothiazide 20-12.5 Mg Tabs (Lisinopril-Hydrochlorothiazide) .... As Needed 8)  Cyanocobalamin 1000 Mcg/ml Soln (Cyanocobalamin) .Marland Kitchen.. 1 Cc Im Q Month 9)  Amlodipine Besylate 10 Mg Tabs (Amlodipine Besylate) .... Take One Tablet By Mouth Daily 10)  Tramadol Hcl 50 Mg Tabs (  Tramadol Hcl) .Marland Kitchen.. 1 By Mouth Q 6 Hrs As Needed Pain 11)  Flexeril 5 Mg Tabs (Cyclobenzaprine Hcl) .Marland Kitchen.. 1po Three Times A Day As Needed  Current Medications (verified): 1)  Aspirin 325 Mg  Tabs (Aspirin) .Marland Kitchen.. 1 Tab By Mouth Once Daily 2)  Klor-Con 10 10 Meq Cr-Tabs (Potassium Chloride) .Marland Kitchen.. 1 By Mouth Once Daily 3)  Simvastatin 40 Mg Tabs (Simvastatin) .Marland Kitchen.. 1 By Mouth Once Daily 4)  Plavix 75 Mg Tabs (Clopidogrel Bisulfate) .... Take One Tablet By Mouth Daily 5)  Pantoprazole Sodium 40 Mg Tbec (Pantoprazole Sodium) .Marland Kitchen.. 1 Tab By Mouth Once Daily 6)  Carvedilol 12.5 Mg Tabs (Carvedilol) .... Take One Tablet By Mouth Twice A Day 7)  Lisinopril-Hydrochlorothiazide 10-12.5 Mg  Tabs (Lisinopril-Hydrochlorothiazide) .Marland Kitchen.. 1po Once Daily 8)  Cyanocobalamin 1000 Mcg/ml Soln (Cyanocobalamin) .Marland Kitchen.. 1 Cc Im Q Month 9)  Amlodipine Besylate 10 Mg Tabs (Amlodipine Besylate) .... Take One Tablet By Mouth Daily 10)  Flexeril 5 Mg Tabs (Cyclobenzaprine Hcl) .Marland Kitchen.. 1po Three Times A Day As Needed  Allergies (verified): 1)  ! Voltaren  Past History:  Past Medical History: Last updated: 01/28/2010 Current Problems:  HYPERLIPIDEMIA (ICD-272.4) HYPERTENSION (ICD-401.9) MYOCARDIAL PERFUSION SCAN, WITH STRESS TEST, ABNORMAL (ICD-794.39) CHEST PAIN (ICD-786.50) ERECTILE DYSFUNCTION (ICD-302.72) HEPATOTOXICITY, DRUG-INDUCED, RISK OF (ICD-V58.69) URI (ICD-465.9) CARPAL TUNNEL SYNDROME, LEFT (ICD-354.0) PARESTHESIA (ICD-782.0) SHOULDER PAIN, LEFT (ICD-719.41) PREVENTIVE HEALTH CARE (ICD-V70.0) DEPRESSION (ICD-311) LOW BACK PAIN (ICD-724.2) HEMORRHOIDS (ICD-455.6) GERD (ICD-530.81) E.D.  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         all  otherwise negative per pt -  except for bilat hand numbness when wakes up in the am, and occasional constipation with certain diet, better with OTC meds  Physical Exam  General:  alert and overweight-appearing.   Head:  normocephalic and atraumatic.   Eyes:  vision grossly intact, pupils equal, and pupils round.   Ears:  R ear normal and L ear normal.   Nose:  no external deformity and no nasal discharge.   Mouth:  no gingival abnormalities and pharynx pink and moist.   Neck:  supple and no masses.   Lungs:  normal respiratory effort and normal breath sounds.   Heart:  normal rate and regular rhythm.   Abdomen:  soft and non-tender.  , no bruits Msk:  no joint tenderness and no joint swelling.   Extremities:  no edema, no erythema  Neurologic:  cranial nerves II-XII intact, strength normal in all lower extremities, and sensation intact to light touch.   Skin:  color normal and no rashes.   Psych:  not depressed appearing and slightly anxious.     Impression & Recommendations:  Problem # 1:  Preventive Health Care (ICD-V70.0) Overall doing well, age appropriate education and counseling  updated, referral for preventive services and immunizations addressed, dietary counseling and smoking status adressed , most recent labs reviewed with pt, ecg  declined  Orders: TLB-BMP (Basic Metabolic Panel-BMET) (80048-METABOL) TLB-CBC Platelet - w/Differential (85025-CBCD) TLB-Hepatic/Liver Function Pnl (80076-HEPATIC) TLB-Lipid Panel (80061-LIPID) TLB-PSA (Prostate Specific Antigen) (84153-PSA) TLB-TSH (Thyroid Stimulating Hormone) (84443-TSH) TLB-Udip ONLY (81003-UDIP)  Problem # 2:  HYPERTENSION (ICD-401.9)  His updated medication list for this problem includes:    Carvedilol 12.5 Mg Tabs (Carvedilol) .Marland Kitchen... Take one tablet by mouth twice a day    Lisinopril-hydrochlorothiazide 10-12.5 Mg Tabs (Lisinopril-hydrochlorothiazide) .Marland Kitchen... 1po once daily    Amlodipine Besylate 10 Mg Tabs (Amlodipine  besylate) .Marland Kitchen... Take one tablet by mouth daily meds adjusted as above - oi/w stable overall by hx and exam, ok to continue other meds/tx as is for better overall control and aovid low BP with the higher strength lis-hct  BP today: 160/80 Prior BP: 136/86 (05/12/2010)  Labs Reviewed: K+: 3.6 (02/09/2010) Creat: : 1.1 (02/09/2010)   Chol: 158 (02/09/2010)   HDL: 29.80 (02/09/2010)   LDL: 100 (02/09/2010)   TG: 142.0 (02/09/2010)  Problem # 3:  HYPERLIPIDEMIA (ICD-272.4)  His updated medication list for this problem includes:    Simvastatin 40 Mg Tabs (Simvastatin) .Marland Kitchen... 1 by mouth once daily  Labs Reviewed: SGOT: 23 (08/28/2009)   SGPT: 20 (08/28/2009)   HDL:29.80 (02/09/2010), 35.30 (08/28/2009)  LDL:100 (02/09/2010), DEL (08/19/2008)  Chol:158 (02/09/2010), 255 (08/28/2009)  Trig:142.0 (02/09/2010), 192.0 (08/28/2009) stable overall by hx and exam, ok to continue meds/tx as is   Problem # 4:  CARPAL TUNNEL SYNDROME, BILATERAL (ICD-354.0)  mild, presumed by hx   for wrist splint at night  Orders: Ankle / Wrist Splint (A4570)  Problem # 5:  DYSPNEA (ICD-786.05) ? etiology - no obvious by exam, for cxr, PFT's Orders: Misc. Referral (Misc. Ref) T-2 View CXR, Same Day (71020.5TC)  Problem # 6:  GERD (ICD-530.81)  His updated medication list for this problem includes:    Pantoprazole Sodium 40 Mg Tbec (Pantoprazole sodium) .Marland Kitchen... 1 tab by mouth once daily treat as above, f/u any worsening signs or symptoms   Complete Medication List: 1)  Aspirin 325 Mg Tabs (Aspirin) .Marland Kitchen.. 1 tab by mouth once daily 2)  Klor-con 10 10 Meq Cr-tabs (Potassium chloride) .Marland Kitchen.. 1 by mouth once daily 3)  Simvastatin 40 Mg Tabs (Simvastatin) .Marland Kitchen.. 1 by mouth once daily 4)  Plavix 75 Mg Tabs (Clopidogrel bisulfate) .... Take one tablet by mouth daily 5)  Pantoprazole Sodium 40 Mg Tbec (Pantoprazole sodium) .Marland Kitchen.. 1 tab by mouth once daily 6)  Carvedilol 12.5 Mg Tabs (Carvedilol) .... Take one tablet by mouth  twice a day 7)  Lisinopril-hydrochlorothiazide 10-12.5 Mg Tabs (Lisinopril-hydrochlorothiazide) .Marland Kitchen.. 1po once daily 8)  Cyanocobalamin 1000 Mcg/ml Soln (Cyanocobalamin) .Marland Kitchen.. 1 cc im q month 9)  Amlodipine Besylate 10 Mg Tabs (Amlodipine besylate) .... Take one tablet by mouth daily 10)  Flexeril 5 Mg Tabs (Cyclobenzaprine hcl) .Marland Kitchen.. 1po three times a day as needed  Other Orders: Flu Vaccine 82yrs + MEDICARE PATIENTS (Z6109) Administration Flu vaccine - MCR (G0008) Vit B12 1000 mcg (J3420) Admin of Therapeutic Inj  intramuscular or subcutaneous (96372) TLB-A1C / Hgb A1C (Glycohemoglobin) (83036-A1C)  Patient Instructions: 1)  you had the flu shot today 2)  you are given the wrist splints to wear at night only 3)  you had the b12 shot today 4)  re-start the pantoprazole (acid reflux medication) 5)  Continue all previous medications as before this visit,  except stop the lisinopril-HCT 20/12.5mg  6)  take the lisinopril -HCT 10/12.5 mg instead 7)  Please go to Radiology in the basement level for your X-Ray today  8)  You will be contacted about the referral(s) to: Lung testing 9)  Please schedule a follow-up appointment in 6 months, or sooner if needed Prescriptions: AMLODIPINE BESYLATE 10 MG TABS (AMLODIPINE BESYLATE) Take one tablet by mouth daily  #90 x 3   Entered and Authorized by:   Corwin Levins MD   Signed by:   Corwin Levins MD on 07/28/2010   Method used:   Print then Give to Patient   RxID:   [id number] CARVEDILOL 12.5 MG TABS (CARVEDILOL) Take one tablet by mouth twice a day  #180 x 3   Entered and Authorized by:   Corwin Levins MD   Signed by:   Corwin Levins MD on 07/28/2010   Method used:   Print then Give to Patient   RxID:   [id number] PANTOPRAZOLE SODIUM 40 MG TBEC (PANTOPRAZOLE SODIUM) 1 tab by mouth once daily  #90 x 3   Entered and Authorized by:   Corwin Levins MD   Signed by:   Corwin Levins MD on 07/28/2010   Method used:   Print then Give to Patient    RxID:   [id number] PLAVIX 75 MG TABS (CLOPIDOGREL BISULFATE) Take one tablet by mouth daily  #90 x 3   Entered and Authorized by:   Corwin Levins MD   Signed by:   Corwin Levins MD on 07/28/2010   Method used:   Print then Give to Patient   RxID:   [id number] SIMVASTATIN 40 MG TABS (SIMVASTATIN) 1 by mouth once daily  #90 x 3   Entered and Authorized by:   Corwin Levins MD   Signed by:   Corwin Levins MD on 07/28/2010   Method used:   Print then Give to Patient   RxID:   [id number] KLOR-CON 10 10 MEQ CR-TABS (POTASSIUM CHLORIDE) 1 by mouth once daily  #90 x 3   Entered and Authorized by:   Corwin Levins MD   Signed by:   Corwin Levins MD on 07/28/2010   Method used:   Print then Give to Patient   RxID:   [id number] LISINOPRIL-HYDROCHLOROTHIAZIDE 10-12.5 MG TABS (LISINOPRIL-HYDROCHLOROTHIAZIDE) 1po once daily  #90 x 3   Entered and Authorized by:   Corwin Levins MD   Signed by:   Corwin Levins MD on 07/28/2010   Method used:   Print then Give to Patient   RxID:   [id number]    Medication Administration  Injection # 1:    Medication: Vit B12 1000 mcg    Diagnosis: VITAMIN B12 DEFICIENCY (ICD-266.2)    Route: IM    Site: L deltoid    Exp Date: 04/2012    Lot #: [id number]    Mfr: [employer] Regent    Patient tolerated injection without complications    Given by: Zella Ball Ewing CMA Duncan Dull) (July 28, 2010 1:33 PM)  Orders Added: 1)  Flu Vaccine 35yrs + MEDICARE PATIENTS [Q2039] 2)  Administration Flu vaccine - MCR [G0008] 3)  Vit B12 1000 mcg [J3420] 4)  Admin of Therapeutic Inj  intramuscular or subcutaneous [96372] 5)  Misc. Referral [Misc. Ref] 6)  Ankle / Wrist Splint [A4570] 7)  TLB-A1C / Hgb A1C (Glycohemoglobin) [83036-A1C] 8)  TLB-BMP (Basic Metabolic Panel-BMET) [80048-METABOL] 9)  TLB-CBC Platelet -  w/Differential [85025-CBCD] 10)  TLB-Hepatic/Liver Function Pnl [80076-HEPATIC] 11)  TLB-Lipid Panel [80061-LIPID] 12)  TLB-PSA (Prostate Specific Antigen)  [84153-PSA] 13)  TLB-TSH (Thyroid Stimulating Hormone) [84443-TSH] 14)  TLB-Udip ONLY [81003-UDIP] 15)  T-2 View CXR, Same Day [71020.5TC] 16)  Est. Patient 65& > [95284]  Flu Vaccine Consent Questions     Do you have a history of severe allergic reactions to this vaccine? no    Any prior history of allergic reactions to egg and/or gelatin? no    Do you have a sensitivity to the preservative Thimersol? no    Do you have a past history of Guillan-Barre Syndrome? no    Do you currently have an acute febrile illness? no    Have you ever had a severe reaction to latex? no    Vaccine information given and explained to patient? yes    Are you currently pregnant? no    Lot Number:[id number]   Exp Date:04/10/2011   Site Given Right Deltoid IM       [email or web address]

## 2010-11-12 NOTE — Letter (Signed)
Summary: Cardiac Catheterization Instructions- Main Lab  [employer], Main Office  [address]   [place], [place] [address]   Phone: [phone]  Fax: [phone]     10/23/2009 MRN: [id number]  PRIMUS GRITTON [address] [place], [place]  [address]  Dear Mr. Axon,   You are scheduled for Cardiac Catheterization on 10/30/2009 with Dr. Eden Emms.            .  Please arrive at the [facility] at  9:30     a.m. on the day of your procedure.  1. DIET     __X__ Nothing to eat or drink after midnight except your medications with a sip of water.  2. MAKE SURE YOU TAKE YOUR ASPIRIN.  3. _____ DO NOT TAKE these medications before your procedure:              ____ YOU MAY TAKE ALL of your remaining medications with a small amount of water.      ____ START NEW medications:   4. Plan for one night stay - bring personal belongings (i.e. toothpaste, toothbrush, etc.)  5. Bring a current list of your medications and current insurance cards.  6. Must have a responsible person to drive you home.   7. Someone must be with yu for the first 24 hours after you arrive home.  9. Please wear clothes that are easy to get on and off and wear slip-on shoes.  *Special note: Every effort is made to have your procedure done on time.  Occasionally there are emergencies that present themselves at the hospital that may cause delays.  Please be patient if a delay does occur.  If you have any questions after you get home, please call the office at the number listed above.  Charolotte Capuchin, RN

## 2010-12-22 ENCOUNTER — Telehealth (INDEPENDENT_AMBULATORY_CARE_PROVIDER_SITE_OTHER): Payer: Self-pay | Admitting: *Deleted

## 2010-12-28 LAB — CBC
HCT: 33.7 % — ABNORMAL LOW (ref 39.0–52.0)
Platelets: 206 10*3/uL (ref 150–400)
WBC: 7.2 10*3/uL (ref 4.0–10.5)

## 2010-12-28 LAB — BASIC METABOLIC PANEL
BUN: 12 mg/dL (ref 6–23)
CO2: 29 mEq/L (ref 19–32)
CO2: 30 mEq/L (ref 19–32)
Calcium: 8.9 mg/dL (ref 8.4–10.5)
Chloride: 101 mEq/L (ref 96–112)
Creatinine, Ser: 0.9 mg/dL (ref 0.4–1.5)
GFR calc Af Amer: >60 mL/min (ref >60)
Glucose, Bld: 106 mg/dL — ABNORMAL HIGH (ref 70–99)
Glucose, Bld: 128 mg/dL — ABNORMAL HIGH (ref 70–99)
Potassium: 3.3 mEq/L — ABNORMAL LOW (ref 3.5–5.1)
Sodium: 136 mEq/L (ref 135–145)

## 2010-12-29 NOTE — Progress Notes (Signed)
  Phone Note Other Incoming   Request: Send information Summary of Call: Request for records received from [facility]. Request forwarded to [facility].  5 yrs hx

## 2011-01-23 ENCOUNTER — Encounter: Payer: Self-pay | Admitting: Internal Medicine

## 2011-01-23 DIAGNOSIS — R7302 Impaired glucose tolerance (oral): Secondary | ICD-10-CM

## 2011-01-23 DIAGNOSIS — Z0000 Encounter for general adult medical examination without abnormal findings: Secondary | ICD-10-CM | POA: Insufficient documentation

## 2011-01-23 HISTORY — DX: Impaired glucose tolerance (oral): R73.02

## 2011-01-26 ENCOUNTER — Other Ambulatory Visit (INDEPENDENT_AMBULATORY_CARE_PROVIDER_SITE_OTHER): Payer: No Typology Code available for payment source

## 2011-01-26 ENCOUNTER — Encounter: Payer: Self-pay | Admitting: Internal Medicine

## 2011-01-26 ENCOUNTER — Ambulatory Visit (INDEPENDENT_AMBULATORY_CARE_PROVIDER_SITE_OTHER): Payer: No Typology Code available for payment source | Admitting: Internal Medicine

## 2011-01-26 VITALS — BP 150/70 | HR 62 | Temp 98.5°F | Ht 68.0 in | Wt 191.4 lb

## 2011-01-26 DIAGNOSIS — R202 Paresthesia of skin: Secondary | ICD-10-CM | POA: Insufficient documentation

## 2011-01-26 DIAGNOSIS — J069 Acute upper respiratory infection, unspecified: Secondary | ICD-10-CM

## 2011-01-26 DIAGNOSIS — J449 Chronic obstructive pulmonary disease, unspecified: Secondary | ICD-10-CM | POA: Insufficient documentation

## 2011-01-26 DIAGNOSIS — E538 Deficiency of other specified B group vitamins: Secondary | ICD-10-CM

## 2011-01-26 DIAGNOSIS — R7302 Impaired glucose tolerance (oral): Secondary | ICD-10-CM

## 2011-01-26 DIAGNOSIS — J329 Chronic sinusitis, unspecified: Secondary | ICD-10-CM | POA: Insufficient documentation

## 2011-01-26 DIAGNOSIS — D649 Anemia, unspecified: Secondary | ICD-10-CM

## 2011-01-26 DIAGNOSIS — R7309 Other abnormal glucose: Secondary | ICD-10-CM

## 2011-01-26 DIAGNOSIS — I10 Essential (primary) hypertension: Secondary | ICD-10-CM

## 2011-01-26 DIAGNOSIS — Z0000 Encounter for general adult medical examination without abnormal findings: Secondary | ICD-10-CM

## 2011-01-26 DIAGNOSIS — M25562 Pain in left knee: Secondary | ICD-10-CM | POA: Insufficient documentation

## 2011-01-26 DIAGNOSIS — R209 Unspecified disturbances of skin sensation: Secondary | ICD-10-CM

## 2011-01-26 DIAGNOSIS — I2510 Atherosclerotic heart disease of native coronary artery without angina pectoris: Secondary | ICD-10-CM

## 2011-01-26 DIAGNOSIS — E785 Hyperlipidemia, unspecified: Secondary | ICD-10-CM

## 2011-01-26 DIAGNOSIS — M25569 Pain in unspecified knee: Secondary | ICD-10-CM

## 2011-01-26 LAB — CBC WITH DIFFERENTIAL/PLATELET
Basophils Absolute: 0 10*3/uL (ref 0.0–0.1)
Basophils Relative: 0.5 % (ref 0.0–3.0)
Eosinophils Absolute: 0.1 10*3/uL (ref 0.0–0.7)
Eosinophils Relative: 1.5 % (ref 0.0–5.0)
HCT: 37.7 % — ABNORMAL LOW (ref 39.0–52.0)
Hemoglobin: 12.9 g/dL — ABNORMAL LOW (ref 13.0–17.0)
Lymphocytes Relative: 45.5 % (ref 12.0–46.0)
Lymphs Abs: 2.5 10*3/uL (ref 0.7–4.0)
MCHC: 34.2 g/dL (ref 30.0–36.0)
MCV: 88.5 fl (ref 78.0–100.0)
Monocytes Absolute: 0.4 10*3/uL (ref 0.1–1.0)
Monocytes Relative: 7.9 % (ref 3.0–12.0)
Neutro Abs: 2.4 10*3/uL (ref 1.4–7.7)
Neutrophils Relative %: 44.6 % (ref 43.0–77.0)
Platelets: 216 10*3/uL (ref 150.0–400.0)
RBC: 4.26 Mil/uL (ref 4.22–5.81)
RDW: 15.2 % — ABNORMAL HIGH (ref 11.5–14.6)
WBC: 5.5 10*3/uL (ref 4.5–10.5)

## 2011-01-26 LAB — HEPATIC FUNCTION PANEL
ALT: 16 U/L (ref 0–53)
AST: 23 U/L (ref 0–37)
Albumin: 3.9 g/dL (ref 3.5–5.2)
Alkaline Phosphatase: 74 U/L (ref 39–117)
Bilirubin, Direct: 0 mg/dL (ref 0.0–0.3)
Total Bilirubin: 0.3 mg/dL (ref 0.3–1.2)
Total Protein: 7.5 g/dL (ref 6.0–8.3)

## 2011-01-26 LAB — BASIC METABOLIC PANEL
BUN: 17 mg/dL (ref 6–23)
CO2: 33 mEq/L — ABNORMAL HIGH (ref 19–32)
Calcium: 9.9 mg/dL (ref 8.4–10.5)
Chloride: 103 mEq/L (ref 96–112)
Creatinine, Ser: 1.1 mg/dL (ref 0.4–1.5)
GFR: 86.24 mL/min (ref >60.00)
Glucose, Bld: 86 mg/dL (ref 70–99)
Potassium: 3.8 mEq/L (ref 3.5–5.1)
Sodium: 143 mEq/L (ref 135–145)

## 2011-01-26 MED ORDER — CYANOCOBALAMIN 1000 MCG/ML IJ SOLN
1000.0000 ug | Freq: Once | INTRAMUSCULAR | Status: AC
  Administered 2011-01-26: 1000 ug via INTRAMUSCULAR

## 2011-01-26 MED ORDER — AZITHROMYCIN 250 MG PO TABS: ORAL_TABLET | ORAL | Status: AC

## 2011-01-26 MED ORDER — TIOTROPIUM BROMIDE MONOHYDRATE 18 MCG IN CAPS: 18.0000 ug | ORAL_CAPSULE | Freq: Every day | RESPIRATORY_TRACT | Status: DC

## 2011-01-26 NOTE — Assessment & Plan Note (Signed)
stable overall by hx and exam, most recent lab reviewed with pt, and pt to continue medical treatment as before  BP Readings from Last 3 Encounters:  01/26/11 150/70  07/28/10 160/80  05/12/10 136/86

## 2011-01-26 NOTE — Assessment & Plan Note (Signed)
stable overall by hx and exam, most recent lab reviewed with pt, and pt to continue medical treatment as before, s/p stent, still on plavix > 1 yr, to f/u Dr Eden Emms august 2012 as he is due

## 2011-01-26 NOTE — Assessment & Plan Note (Signed)
Chronic recurrent at night, c/w prob positional ulnar irritation vs CTS, EMG/NCS neg approx one yr ago, ok to follow

## 2011-01-26 NOTE — Assessment & Plan Note (Signed)
stable overall by hx and exam, most recent lab reviewed with pt, and pt to continue medical treatment as before   Lab Results  Component Value Date   LDLCALC 100* 02/09/2010

## 2011-01-26 NOTE — Patient Instructions (Addendum)
Take all new medications as prescribed - the spiriva inhaler daily, and the antibiotic Continue all other medications as before Remember you have a cardiology followup appt due in August Please go to LAB in the Basement for the blood and/or urine tests to be done today Please call the number on the [facility] Card (the PhoneTree System) for results of testing in 2-3 days Please return in 6 mo with Lab testing done 3-5 days before

## 2011-01-26 NOTE — Progress Notes (Signed)
Subjective:    Patient ID: Travis Carey, male    DOB: 17-Jun-1941, 70 y.o.   MRN: [id number]  HPI  Here to f/u;  States BP in the afternoons often about 120 sbp but tends feel weak, lightheaded during that time, feels better with higher pressures; checks BP at home BID;  Only happened  1-2 times so far in the psat 2 wks.  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  CXR  Neg oct 2011.  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  Also has numbness persistent to the hands at night only that is positional left more than right, without neck pain, radicular pain, weakness, or rash.  Has some bilat hand stiffness as well, off and on during the day. No overt bleeding or bruising.  Pt denies chest pain, increased sob or doe, wheezing, orthopnea, PND, increased LE swelling, palpitations, dizziness or syncope, but has baseline dyspnea with exertion no change.  Pt denies new neurological symptoms such as new headache, or facial or extremity weakness or numbness except for the arms above.   Past Medical History  Diagnosis Date  . VITAMIN B12 DEFICIENCY 02/09/2010  . GLUCOSE INTOLERANCE 01/28/2010  . HYPERLIPIDEMIA 02/07/2008  . HYPOKALEMIA 01/28/2010  . ANEMIA-NOS 01/28/2010  . ERECTILE DYSFUNCTION 02/24/2009  . DEPRESSION 06/12/2007  . Carpal tunnel syndrome 05/20/2008  . HYPERTENSION 06/09/2007  . CAD 02/17/2010  . HEMORRHOIDS 06/09/2007  . URI 08/19/2008  . GERD 06/09/2007  . SHOULDER PAIN, LEFT 04/25/2008  . LOW BACK PAIN 06/12/2007  . PARESTHESIA 04/25/2008  . CAROTID BRUIT 11/18/2009  . DYSPNEA 07/28/2010  . CHEST PAIN 09/26/2009  . MYOCARDIAL PERFUSION SCAN, WITH STRESS TEST, ABNORMAL 10/08/2009  . Impaired glucose tolerance 01/23/2011   No past surgical history  on file.  reports that he has never smoked. He does not have any smokeless tobacco history on file. He reports that he does not drink alcohol or use illicit drugs. family history includes Alcohol abuse in his brother and sister; Coronary artery disease in his other; Heart attack in his other; Hypertension in his other; and Stroke in his other. Allergies  Allergen Reactions  . Diclofenac Sodium     REACTION: maks pt drowsey   Current Outpatient Prescriptions on File Prior to Visit  Medication Sig Dispense Refill  . amLODipine (NORVASC) 10 MG tablet Take 10 mg by mouth daily.        Marland Kitchen aspirin 325 MG tablet Take 325 mg by mouth daily.        . carvedilol (COREG) 12.5 MG tablet Take 12.5 mg by mouth 2 (two) times daily.        . clopidogrel (PLAVIX) 75 MG tablet Take 75 mg by mouth daily.        . cyanocobalamin (,VITAMIN B-12,) 1000 MCG/ML injection Inject 1,000 mcg into the muscle. 1 cc IM every month       . lisinopril-hydrochlorothiazide (PRINZIDE,ZESTORETIC) 10-12.5 MG per tablet Take 1 tablet by mouth daily.        . pantoprazole (PROTONIX) 40 MG tablet Take 40 mg by mouth daily.        . potassium chloride (KLOR-CON 10) 10 MEQ CR tablet Take 10 mEq by mouth daily.        Marland Kitchen  simvastatin (ZOCOR) 40 MG tablet Take 40 mg by mouth daily.         Review of Systems Review of Systems  Constitutional: Negative for diaphoresis and unexpected weight change.  HENT: Negative for drooling and tinnitus.   Eyes: Negative for photophobia and visual disturbance.  Respiratory: Negative for choking and stridor.   Gastrointestinal: Negative for vomiting and blood in stool.  Genitourinary: Negative for hematuria and decreased urine volume.  Musculoskeletal: Negative for gait problem.  Skin: Negative for color change and wound.  Neurological: Negative for tremors and numbness.  Psychiatric/Behavioral: Negative for decreased concentration. The patient is not hyperactive.       Objective:   Physical  Exam BP 150/70  Pulse 62  Temp(Src) 98.5 F (36.9 C) (Oral)  Ht 5\' 8"  (1.727 m)  Wt 191 lb 6 oz (86.807 kg)  BMI 29.10 kg/m2  SpO2 96% Physical Exam  VS noted, mild ill  Constitutional: Pt appears well-developed and well-nourished.  HENT: Head: Normocephalic.  Right Ear: External ear normal.  Left Ear: External ear normal.  Eyes: Conjunctivae and EOM are normal. Pupils are equal, round, and reactive to light.  Neck: Normal range of motion. Neck supple.  Cardiovascular: Normal rate and regular rhythm.   Pulmonary/Chest: Effort normal and breath sounds decreased bilat  Abd:  Soft, NT, non-distended, + BS Neurological: Pt is alert. No cranial nerve deficit.  Skin: Skin is warm. No erythema.  Psychiatric: Pt behavior is normal. Thought content normal.  Left knee with mild crepitus, small effusion, with FROM, NT       Assessment & Plan:

## 2011-01-26 NOTE — Assessment & Plan Note (Signed)
With effusion, liekly DJD flare, ok to follow as symtpoms overall improving, to ortho if pt decides later

## 2011-01-26 NOTE — Progress Notes (Signed)
Quick Note:  Voice message left on PhoneTree system - lab is negative, normal or otherwise stable, pt to continue same tx ______ 

## 2011-01-26 NOTE — Assessment & Plan Note (Signed)
asympt - to check a1c today,  to f/u any worsening symptoms or concerns  

## 2011-01-27 ENCOUNTER — Encounter: Payer: Self-pay | Admitting: Internal Medicine

## 2011-01-27 DIAGNOSIS — J069 Acute upper respiratory infection, unspecified: Secondary | ICD-10-CM | POA: Insufficient documentation

## 2011-01-27 NOTE — Assessment & Plan Note (Signed)
Mild, fro zpack x 1;  to f/u any worsening symptoms or concerns

## 2011-01-27 NOTE — Assessment & Plan Note (Signed)
Mild symptoms overall by hx and exam, most recent lab reviewed with pt, and pt to continue medical treatment as before except add spiriva trial

## 2011-02-26 NOTE — Assessment & Plan Note (Signed)
[facility]                             PULMONARY OFFICE NOTE   NAME:Travis Carey, Carey                         MRN:          [id number]  DATE:11/29/2006                            DOB:          Jun 15, 1941    HISTORY OF PRESENT ILLNESS:  The patient is 70 year old African American  patient of Dr. Melvyn Novas who presents for an acute office visit. The patient  complains of a 3-day history of right jaw pain, right ear pain. The  patient denies any fever, chest pain, shortness of breath. The patient  does complain that he has had some tooth pain as well and has been  evaluated by a dentist and needs to have a couple of teeth removed. He  is planning to make an appointment in the next few weeks.   PAST MEDICAL HISTORY:  Reviewed.   CURRENT MEDICATIONS:  Reviewed.   PHYSICAL EXAMINATION:  The patient is black male in no acute distress.  He is afebrile with stable vital signs. O2 saturation is 96% on room  air.  HEENT: Nasal mucosa is pink and moist. Conjunctivae were not injected.  Tympanic membranes are normal. EACs are clear. Posterior pharynx is  clear. The patient has multiple dental carries and several missing  teeth. The patient only has five bottom teeth and three top teeth and  several of them are broken. Along the right lower molar is noted to be  loose with noted cavity.  NECK: Supple without adenopathy. No JVD.  LUNGS: Lung sounds are clear.  CARDIAC: Regular rate.  ABDOMEN: Soft and nontender.  EXTREMITIES: Warm without edema.   IMPRESSION/PLAN:  Right dental pain felt secondary to probable multiple  dental cavities, possible underlying infection. The patient was started  on amoxicillin 500 mg twice daily. He is to use some over-the-counter  topical products for temporary pain relief. The patient was given  Darvocet as needed for pain. The patient is to contact his dentist  immediately and set up for an evaluation for possible removal. The  patient assures me that he will set this up immediately.     Rubye Oaks, NP  Electronically Signed      Lonzo Cloud. Kriste Basque, MD  Electronically Signed   TP/MedQ  DD: 11/29/2006  DT: 11/29/2006  Job #: [id number]

## 2011-07-09 ENCOUNTER — Ambulatory Visit (INDEPENDENT_AMBULATORY_CARE_PROVIDER_SITE_OTHER): Payer: No Typology Code available for payment source | Admitting: Internal Medicine

## 2011-07-09 ENCOUNTER — Encounter: Payer: Self-pay | Admitting: Internal Medicine

## 2011-07-09 VITALS — BP 142/70 | HR 74 | Temp 99.6°F | Ht 68.0 in | Wt 198.0 lb

## 2011-07-09 DIAGNOSIS — J449 Chronic obstructive pulmonary disease, unspecified: Secondary | ICD-10-CM

## 2011-07-09 DIAGNOSIS — E538 Deficiency of other specified B group vitamins: Secondary | ICD-10-CM

## 2011-07-09 DIAGNOSIS — J019 Acute sinusitis, unspecified: Secondary | ICD-10-CM | POA: Insufficient documentation

## 2011-07-09 DIAGNOSIS — I10 Essential (primary) hypertension: Secondary | ICD-10-CM

## 2011-07-09 DIAGNOSIS — I2510 Atherosclerotic heart disease of native coronary artery without angina pectoris: Secondary | ICD-10-CM

## 2011-07-09 MED ORDER — POTASSIUM CHLORIDE 10 MEQ PO TBCR: 10.0000 meq | EXTENDED_RELEASE_TABLET | Freq: Every day | ORAL | Status: DC

## 2011-07-09 MED ORDER — TIOTROPIUM BROMIDE MONOHYDRATE 18 MCG IN CAPS: 18.0000 ug | ORAL_CAPSULE | Freq: Every day | RESPIRATORY_TRACT | Status: DC

## 2011-07-09 MED ORDER — LEVOFLOXACIN 250 MG PO TABS: 250.0000 mg | ORAL_TABLET | Freq: Every day | ORAL | Status: AC

## 2011-07-09 MED ORDER — CLOPIDOGREL BISULFATE 75 MG PO TABS: 75.0000 mg | ORAL_TABLET | Freq: Every day | ORAL | Status: DC

## 2011-07-09 MED ORDER — AMLODIPINE BESYLATE 5 MG PO TABS: 5.0000 mg | ORAL_TABLET | Freq: Every day | ORAL | Status: DC

## 2011-07-09 MED ORDER — CARVEDILOL 12.5 MG PO TABS: 12.5000 mg | ORAL_TABLET | Freq: Two times a day (BID) | ORAL | Status: DC

## 2011-07-09 MED ORDER — HYDROCODONE-HOMATROPINE 5-1.5 MG/5ML PO SYRP: 5.0000 mL | ORAL_SOLUTION | Freq: Four times a day (QID) | ORAL | Status: AC | PRN

## 2011-07-09 MED ORDER — CYANOCOBALAMIN 1000 MCG/ML IJ SOLN
1000.0000 ug | Freq: Once | INTRAMUSCULAR | Status: AC
  Administered 2011-07-09: 1000 ug via INTRAMUSCULAR

## 2011-07-09 MED ORDER — PANTOPRAZOLE SODIUM 40 MG PO TBEC: 40.0000 mg | DELAYED_RELEASE_TABLET | Freq: Every day | ORAL | Status: DC

## 2011-07-09 NOTE — Assessment & Plan Note (Addendum)
O/w stable, except for cough - for cough med prn ,  to f/u any worsening symptoms or concerns, ok to re-start the spiriva

## 2011-07-09 NOTE — Patient Instructions (Addendum)
Take all new medications as prescribed - the antibiotic, cough med OK to decrease the amlodipine to 5 mg per day Please take all other meds every day - including the the plavix and the zestoretic Continue all other medications as before

## 2011-07-09 NOTE — Assessment & Plan Note (Signed)
overcontrolled or intolerant of lower sbp about 120 is seems;  For now to reduce the norvasc to 5 qd, and encouraged to take the zestoretic daily, refills done today

## 2011-07-09 NOTE — Assessment & Plan Note (Signed)
For routine replacement today 

## 2011-07-09 NOTE — Progress Notes (Signed)
Subjective:    Patient ID: Travis Carey, male    DOB: [birth year], 70 y.o.   MRN: [id number]  HPI   Here with 3 days acute onset fever, facial pain, pressure, general weakness and malaise, and greenish d/c, with slight ST, with mild nonprod cough and Pt denies chest pain, increased sob or doe, wheezing, orthopnea, PND, increased LE swelling, palpitations, or syncope.  Pt denies new neurological symptoms such as new headache, or facial or extremity weakness or numbness  Does Have recurrent dizziness when takes norvasc and zestoretic, so only taking the zestoretic every few days, BP tends to run lower at home, with sbp < 120 most often.  Has not been taking plavix recently, not clear why, nor taking the zocor or spiriva, as he was hoping to get by with less cost.  Was unaware plavix is generic now.   Past Medical History  Diagnosis Date  . VITAMIN B12 DEFICIENCY 02/09/2010  . GLUCOSE INTOLERANCE 01/28/2010  . HYPERLIPIDEMIA 02/07/2008  . HYPOKALEMIA 01/28/2010  . ANEMIA-NOS 01/28/2010  . ERECTILE DYSFUNCTION 02/24/2009  . DEPRESSION 06/12/2007  . Carpal tunnel syndrome 05/20/2008  . HYPERTENSION 06/09/2007  . CAD 02/17/2010  . HEMORRHOIDS 06/09/2007  . URI 08/19/2008  . GERD 06/09/2007  . SHOULDER PAIN, LEFT 04/25/2008  . LOW BACK PAIN 06/12/2007  . PARESTHESIA 04/25/2008  . CAROTID BRUIT 11/18/2009  . DYSPNEA 07/28/2010  . CHEST PAIN 09/26/2009  . MYOCARDIAL PERFUSION SCAN, WITH STRESS TEST, ABNORMAL 10/08/2009  . Impaired glucose tolerance 01/23/2011   No past surgical history on file.  reports that he has never smoked. He does not have any smokeless tobacco history on file. He reports that he does not drink alcohol or use illicit drugs. family history includes Alcohol abuse in his brother and sister; Coronary artery disease in his other; Heart attack in his other; Hypertension in his other; and Stroke in his other. Allergies  Allergen Reactions  . Diclofenac Sodium     REACTION: maks pt drowsey    Current Outpatient Prescriptions on File Prior to Visit  Medication Sig Dispense Refill  . aspirin 325 MG tablet Take 325 mg by mouth daily.        . carvedilol (COREG) 12.5 MG tablet Take 12.5 mg by mouth 2 (two) times daily.        . cyanocobalamin (,VITAMIN B-12,) 1000 MCG/ML injection Inject 1,000 mcg into the muscle. 1 cc IM every month       . lisinopril-hydrochlorothiazide (PRINZIDE,ZESTORETIC) 10-12.5 MG per tablet Take 1 tablet by mouth daily.        . pantoprazole (PROTONIX) 40 MG tablet Take 40 mg by mouth daily.        . potassium chloride (KLOR-CON 10) 10 MEQ CR tablet Take 10 mEq by mouth daily.        . clopidogrel (PLAVIX) 75 MG tablet Take 75 mg by mouth daily.        . simvastatin (ZOCOR) 40 MG tablet Take 40 mg by mouth daily.        Marland Kitchen tiotropium (SPIRIVA HANDIHALER) 18 MCG inhalation capsule Place 1 capsule (18 mcg total) into inhaler and inhale daily.  30 capsule  12   No current facility-administered medications on file prior to visit.   Review of Systems Review of Systems  Constitutional: Negative for diaphoresis and unexpected weight change.  HENT: Negative for drooling and tinnitus.   Eyes: Negative for photophobia and visual disturbance.  Respiratory: Negative for choking and stridor.  Gastrointestinal: Negative for vomiting and blood in stool.  Genitourinary: Negative for hematuria and decreased urine volume.     Objective:   Physical Exam BP 142/70  Pulse 74  Temp(Src) 99.6 F (37.6 C) (Oral)  Ht 5\' 8"  (1.727 m)  Wt 198 lb (89.812 kg)  BMI 30.11 kg/m2  SpO2 95% Physical Exam  VS noted, mild ill  Constitutional: Pt appears well-developed and well-nourished.  HENT: Head: Normocephalic.  Right Ear: External ear normal.  Left Ear: External ear normal.  Bilat tm's mild erythema.  Sinus tender bilat.  Pharynx mild erythema Eyes: Conjunctivae and EOM are normal. Pupils are equal, round, and reactive to light.  Neck: Normal range of motion. Neck  supple.  Cardiovascular: Normal rate and regular rhythm.   Pulmonary/Chest: Effort normal and breath sounds normal.  Neurological: Pt is alert. No cranial nerve deficit.  Skin: Skin is warm. No erythema. No bruising or bleeding Psychiatric: Pt behavior is normal. Thought content normal.         Assessment & Plan:

## 2011-07-09 NOTE — Assessment & Plan Note (Signed)
Mild to mod, for antibx course,  to f/u any worsening symptoms or concerns 

## 2011-07-09 NOTE — Assessment & Plan Note (Signed)
stable overall by hx and exam,  and pt to continue medical treatment as before - encouraged to cont the plavix s/p stent though has been > 1 yr as tolerated well, clincially stable and plavix less expensive now

## 2011-07-27 ENCOUNTER — Ambulatory Visit: Payer: No Typology Code available for payment source | Admitting: Internal Medicine

## 2011-07-27 ENCOUNTER — Ambulatory Visit (INDEPENDENT_AMBULATORY_CARE_PROVIDER_SITE_OTHER): Payer: No Typology Code available for payment source | Admitting: Internal Medicine

## 2011-07-27 ENCOUNTER — Other Ambulatory Visit: Payer: Self-pay | Admitting: Internal Medicine

## 2011-07-27 ENCOUNTER — Encounter: Payer: Self-pay | Admitting: Internal Medicine

## 2011-07-27 ENCOUNTER — Other Ambulatory Visit (INDEPENDENT_AMBULATORY_CARE_PROVIDER_SITE_OTHER): Payer: No Typology Code available for payment source

## 2011-07-27 VITALS — BP 152/78 | HR 66 | Temp 97.6°F | Wt 199.1 lb

## 2011-07-27 DIAGNOSIS — R7309 Other abnormal glucose: Secondary | ICD-10-CM

## 2011-07-27 DIAGNOSIS — Z0000 Encounter for general adult medical examination without abnormal findings: Secondary | ICD-10-CM

## 2011-07-27 DIAGNOSIS — R5383 Other fatigue: Secondary | ICD-10-CM

## 2011-07-27 DIAGNOSIS — Z23 Encounter for immunization: Secondary | ICD-10-CM

## 2011-07-27 DIAGNOSIS — R7302 Impaired glucose tolerance (oral): Secondary | ICD-10-CM

## 2011-07-27 DIAGNOSIS — R5381 Other malaise: Secondary | ICD-10-CM

## 2011-07-27 DIAGNOSIS — J019 Acute sinusitis, unspecified: Secondary | ICD-10-CM

## 2011-07-27 DIAGNOSIS — I10 Essential (primary) hypertension: Secondary | ICD-10-CM

## 2011-07-27 LAB — LIPID PANEL
Cholesterol: 244 mg/dL — ABNORMAL HIGH (ref 0–200)
Triglycerides: 308 mg/dL — ABNORMAL HIGH (ref 0.0–149.0)

## 2011-07-27 LAB — CBC WITH DIFFERENTIAL/PLATELET
Basophils Absolute: 0.1 10*3/uL (ref 0.0–0.1)
Hemoglobin: 12.9 g/dL — ABNORMAL LOW (ref 13.0–17.0)
Lymphocytes Relative: 44.8 % (ref 12.0–46.0)
Monocytes Relative: 8.1 % (ref 3.0–12.0)
Neutro Abs: 2.3 10*3/uL (ref 1.4–7.7)
Neutrophils Relative %: 42.5 % — ABNORMAL LOW (ref 43.0–77.0)
RBC: 4.23 Mil/uL (ref 4.22–5.81)
RDW: 14.3 % (ref 11.5–14.6)

## 2011-07-27 LAB — BASIC METABOLIC PANEL
CO2: 31 mEq/L (ref 19–32)
Calcium: 9.6 mg/dL (ref 8.4–10.5)
Creatinine, Ser: 1.1 mg/dL (ref 0.4–1.5)
Glucose, Bld: 93 mg/dL (ref 70–99)

## 2011-07-27 LAB — URINALYSIS, ROUTINE W REFLEX MICROSCOPIC
Hgb urine dipstick: NEGATIVE
Nitrite: NEGATIVE
Specific Gravity, Urine: 1.02 (ref 1.000–1.030)
Urine Glucose: NEGATIVE
Urobilinogen, UA: 0.2 (ref 0.0–1.0)

## 2011-07-27 LAB — HEMOGLOBIN A1C: Hgb A1c MFr Bld: 6.1 % (ref 4.6–6.5)

## 2011-07-27 MED ORDER — AMLODIPINE BESYLATE 5 MG PO TABS: 5.0000 mg | ORAL_TABLET | Freq: Every day | ORAL | Status: DC

## 2011-07-27 NOTE — Assessment & Plan Note (Signed)
Resolved, no further tx needded at this time

## 2011-07-27 NOTE — Assessment & Plan Note (Signed)
Mild uncontrolled, but not taking the norvasc for some reasosn, ok to re-start

## 2011-07-27 NOTE — Assessment & Plan Note (Addendum)
Etiology unclear, Exam otherwise benign, to check labs as documented, follow with expectant management; I suspect is related to post sinus infection and should improve in 1-2 wks

## 2011-07-27 NOTE — Patient Instructions (Addendum)
You had the flu shot today Your sinus infection appears resolved today Please re-start the norvasc today (was sent to your pharmacy - [facility]) Continue all other medications as before; please have the pharmacy call if you need further refills Please go to LAB in the Basement for the blood and/or urine tests to be done today Please call the phone number [phone] (the PhoneTree System) for results of testing in 2-3 days;  When calling, simply dial the number, and when prompted enter the MRN number above (the Medical Record Number) and the # key, then the message should start. Please call with the information we discussed about having the Colonscopy followup done at the [facility] in [place] Please return in 6 mo with Lab testing done 3-5 days before

## 2011-07-27 NOTE — Assessment & Plan Note (Addendum)
Overall doing well, age appropriate education and counseling updated, referrals for preventative services and immunizations addressed, dietary and smoking counseling addressed, most recent labs and ECG reviewed.  I have personally reviewed and have noted: 1) the patient's medical and social history 2) The pt's use of alcohol, tobacco, and illicit drugs 3) The patient's current medications and supplements 4) Functional ability including ADL's, fall risk, home safety risk, hearing and visual impairment 5) Diet and physical activities 6) Evidence for depression or mood disorder 7) The patient's height, weight, and BMI have been recorded in the chart I have made referrals, and provided counseling and education based on review of the above For colonoscopy, but would like done in [place], will get me name of GI to refer to there as his wife wants him to see the same; also for flu shot today, and routine labs

## 2011-07-27 NOTE — Progress Notes (Signed)
Subjective:    Patient ID: Travis Carey, male    DOB: 07-10-1941, 70 y.o.   MRN: [id number]  HPI  Here for wellness and f/u;  Overall doing ok;  Pt denies CP, worsening SOB, DOE, wheezing, orthopnea, PND, worsening LE edema, palpitations, dizziness or syncope.  Pt denies neurological change such as new Headache, facial or extremity weakness.  Pt denies polydipsia, polyuria, or low sugar symptoms. Pt states overall good compliance with treatment and medications, good tolerability, and trying to follow lower cholesterol diet.  Pt denies worsening depressive symptoms, suicidal ideation or panic. No fever, wt loss, night sweats, loss of appetite, or other constitutional symptoms.  Pt states good ability with ADL's, low fall risk, home safety reviewed and adequate, no significant changes in hearing or vision, and occasionally active with exercise.  Also - Here with c/o "being lazy" in the AM, more difficult just get OOB, seems onset since the recent infection.  No other acute complaints.  Sinus symptoms have resolved. Mentions has not been taking the norvasc - ran out it seems.  Past Medical History  Diagnosis Date  . VITAMIN B12 DEFICIENCY 02/09/2010  . GLUCOSE INTOLERANCE 01/28/2010  . HYPERLIPIDEMIA 02/07/2008  . HYPOKALEMIA 01/28/2010  . ANEMIA-NOS 01/28/2010  . ERECTILE DYSFUNCTION 02/24/2009  . DEPRESSION 06/12/2007  . Carpal tunnel syndrome 05/20/2008  . HYPERTENSION 06/09/2007  . CAD 02/17/2010  . HEMORRHOIDS 06/09/2007  . URI 08/19/2008  . GERD 06/09/2007  . SHOULDER PAIN, LEFT 04/25/2008  . LOW BACK PAIN 06/12/2007  . PARESTHESIA 04/25/2008  . CAROTID BRUIT 11/18/2009  . DYSPNEA 07/28/2010  . CHEST PAIN 09/26/2009  . MYOCARDIAL PERFUSION SCAN, WITH STRESS TEST, ABNORMAL 10/08/2009  . Impaired glucose tolerance 01/23/2011   No past surgical history on file.  reports that he has never smoked. He does not have any smokeless tobacco history on file. He reports that he does not drink alcohol or use illicit  drugs. family history includes Alcohol abuse in his brother and sister; Coronary artery disease in his other; Heart attack in his other; Hypertension in his other; and Stroke in his other. Allergies  Allergen Reactions  . Diclofenac Sodium     REACTION: maks pt drowsey   Current Outpatient Prescriptions on File Prior to Visit  Medication Sig Dispense Refill  . aspirin 325 MG tablet Take 325 mg by mouth daily.        . carvedilol (COREG) 12.5 MG tablet Take 1 tablet (12.5 mg total) by mouth 2 (two) times daily.  180 tablet  3  . clopidogrel (PLAVIX) 75 MG tablet Take 1 tablet (75 mg total) by mouth daily.  90 tablet  3  . cyanocobalamin (,VITAMIN B-12,) 1000 MCG/ML injection Inject 1,000 mcg into the muscle. 1 cc IM every month       . lisinopril-hydrochlorothiazide (PRINZIDE,ZESTORETIC) 10-12.5 MG per tablet Take 1 tablet by mouth daily.        . pantoprazole (PROTONIX) 40 MG tablet Take 1 tablet (40 mg total) by mouth daily.  90 tablet  3  . potassium chloride (KLOR-CON 10) 10 MEQ CR tablet Take 1 tablet (10 mEq total) by mouth daily.  90 tablet  3  . tiotropium (SPIRIVA HANDIHALER) 18 MCG inhalation capsule Place 1 capsule (18 mcg total) into inhaler and inhale daily.  30 capsule  12   oReview of Systems Review of Systems  Constitutional: Negative for diaphoresis, activity change, appetite change and unexpected weight change.  HENT: Negative for hearing loss, ear pain,  facial swelling, mouth sores and neck stiffness.   Eyes: Negative for pain, redness and visual disturbance.  Respiratory: Negative for shortness of breath and wheezing.   Cardiovascular: Negative for chest pain and palpitations.  Gastrointestinal: Negative for diarrhea, blood in stool, abdominal distention and rectal pain.  Genitourinary: Negative for hematuria, flank pain and decreased urine volume.  Musculoskeletal: Negative for myalgias and joint swelling.  Skin: Negative for color change and wound.  Neurological:  Negative for syncope and numbness.  Hematological: Negative for adenopathy.  Psychiatric/Behavioral: Negative for hallucinations, self-injury, decreased concentration and agitation.       Objective:   Physical Exam BP 152/78  Pulse 66  Temp(Src) 97.6 F (36.4 C) (Oral)  Wt 199 lb 1.9 oz (90.32 kg)  SpO2 96% Physical Exam  VS noted, not ill appearing but fatigued Constitutional: Pt is oriented to person, place, and time. Appears well-developed and well-nourished.  HENT:  Head: Normocephalic and atraumatic.  Right Ear: External ear normal.  Left Ear: External ear normal.  Nose: Nose normal.  Mouth/Throat: Oropharynx is clear and moist.  Eyes: Conjunctivae and EOM are normal. Pupils are equal, round, and reactive to light.  Neck: Normal range of motion. Neck supple. No JVD present. No tracheal deviation present.  Cardiovascular: Normal rate, regular rhythm, normal heart sounds and intact distal pulses.   Pulmonary/Chest: Effort normal and breath sounds normal.  Abdominal: Soft. Bowel sounds are normal. There is no tenderness.  Musculoskeletal: Normal range of motion. Exhibits no edema.  Lymphadenopathy:  Has no cervical adenopathy.  Neurological: Pt is alert and oriented to person, place, and time. Pt has normal reflexes. No cranial nerve deficit.  Skin: Skin is warm and dry. No rash noted.  Psychiatric:  Has  normal mood and affect. Behavior is normal.     Assessment & Plan:

## 2011-07-27 NOTE — Assessment & Plan Note (Signed)
asympt - for lab today,  to f/u any worsening symptoms or concerns

## 2011-07-28 ENCOUNTER — Telehealth: Payer: Self-pay | Admitting: *Deleted

## 2011-07-28 DIAGNOSIS — K219 Gastro-esophageal reflux disease without esophagitis: Secondary | ICD-10-CM

## 2011-07-28 DIAGNOSIS — I2510 Atherosclerotic heart disease of native coronary artery without angina pectoris: Secondary | ICD-10-CM

## 2011-07-28 LAB — HEPATIC FUNCTION PANEL
ALT: 16 U/L (ref 0–53)
Albumin: 3.8 g/dL (ref 3.5–5.2)
Alkaline Phosphatase: 90 U/L (ref 39–117)
Total Protein: 7.8 g/dL (ref 6.0–8.3)

## 2011-07-28 NOTE — Telephone Encounter (Signed)
Pt's wife requesting callback regarding colonoscopy information-left message to callback office

## 2011-07-28 NOTE — Telephone Encounter (Signed)
Pt wants colonoscopy scheduled in [place] with Dr. Mechele Collin or Dr. Ruthann Cancer (?) preferably on a Monday morning and also wants referral for pt to see cardiologist in [place] to F/U with heart blockage that he had last year (okay to leave message on # listed above)

## 2011-07-29 NOTE — Telephone Encounter (Signed)
Does the pt know the name of the cardiologist or which group he is in?

## 2011-07-29 NOTE — Telephone Encounter (Signed)
Done per emr 

## 2011-07-29 NOTE — Telephone Encounter (Signed)
Pt will request his wife to call back with the name of cardiologist.

## 2011-07-29 NOTE — Telephone Encounter (Signed)
Pt does not know the name of the cardiologist-she wants him to be seen with a cardiologist in [place]-it looks like he previously saw Dr. Otelia Sergeant in the system?

## 2011-07-30 NOTE — Telephone Encounter (Signed)
Pt's spouse advise

## 2011-08-10 ENCOUNTER — Encounter: Payer: Self-pay | Admitting: Cardiovascular Disease

## 2011-08-12 ENCOUNTER — Encounter: Payer: Self-pay | Admitting: Cardiovascular Disease

## 2011-08-12 ENCOUNTER — Ambulatory Visit (INDEPENDENT_AMBULATORY_CARE_PROVIDER_SITE_OTHER): Payer: No Typology Code available for payment source | Admitting: Cardiovascular Disease

## 2011-08-12 DIAGNOSIS — I2510 Atherosclerotic heart disease of native coronary artery without angina pectoris: Secondary | ICD-10-CM

## 2011-08-12 DIAGNOSIS — E785 Hyperlipidemia, unspecified: Secondary | ICD-10-CM

## 2011-08-12 DIAGNOSIS — I10 Essential (primary) hypertension: Secondary | ICD-10-CM

## 2011-08-12 DIAGNOSIS — G5600 Carpal tunnel syndrome, unspecified upper limb: Secondary | ICD-10-CM

## 2011-08-12 MED ORDER — ASPIRIN 81 MG PO TABS: 81.0000 mg | ORAL_TABLET | Freq: Every day | ORAL | Status: DC

## 2011-08-12 MED ORDER — LISINOPRIL-HYDROCHLOROTHIAZIDE 10-12.5 MG PO TABS: 2.0000 | ORAL_TABLET | Freq: Every day | ORAL | Status: DC

## 2011-08-12 MED ORDER — SIMVASTATIN 40 MG PO TABS: 40.0000 mg | ORAL_TABLET | Freq: Every day | ORAL | Status: DC

## 2011-08-12 NOTE — Assessment & Plan Note (Signed)
We have renewed his simvastatin. Recent cholesterol 2 weeks ago was elevated well above his goal. We have stressed to him the importance of staying on his simvastatin 40 mg daily.

## 2011-08-12 NOTE — Progress Notes (Signed)
Patient ID: Travis Carey, male    DOB: Jul 19, 1941, 70 y.o.   MRN: [id number]  HPI Comments: Travis Carey is a 70 year old gentleman with coronary artery disease, stent to the RCA, hyperlipidemia who presents for routine followup.  H/o unstable angina with stenting of the RCA on 10/31/2009.  He has been compliant with his meds and not having any SSCP.  He has good LV function and no significant disease in his left system.    He does report having some shortness of breath that comes on after he eats, periodically at rest particularly in the morning. He denies any shortness of breath with exertion or activity. Otherwise his energy is good.  He does have significant discomfort in his wrists worse on the left than the right, often at nighttime. It is positional. 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 aspirin 81 MG tablet Take 1 tablet (81 mg total) by mouth daily.  90 tablet  4  . carvedilol (COREG) 12.5 MG tablet Take 1 tablet (12.5 mg total) by mouth 2 (two) times daily.  180 tablet  3  . clopidogrel (PLAVIX) 75 MG tablet Take 1 tablet (75 mg total) by mouth daily.  90 tablet  3  . cyanocobalamin (,VITAMIN B-12,) 1000 MCG/ML injection Inject 1,000 mcg into the muscle. 1 cc IM every month       . lisinopril-hydrochlorothiazide (PRINZIDE,ZESTORETIC) 10-12.5 MG per tablet Take 2 tablets by mouth daily.  60 tablet  11  . pantoprazole (PROTONIX) 40 MG tablet Take 1 tablet (40 mg total) by mouth daily.  90 tablet  3  . potassium chloride (KLOR-CON 10) 10 MEQ CR tablet Take 1 tablet (10 mEq total) by mouth daily.  90 tablet  3  . simvastatin (ZOCOR) 40 MG tablet Take 1 tablet (40 mg total) by mouth at bedtime.  90 tablet  4  . tiotropium  (SPIRIVA HANDIHALER) 18 MCG inhalation capsule Place 1 capsule (18 mcg total) into inhaler and inhale daily.  30 capsule  12    Review of Systems  Constitutional: Negative.   HENT: Negative.   Eyes: Negative.   Respiratory: Positive for shortness of breath.   Cardiovascular: Negative.   Gastrointestinal: Negative.   Musculoskeletal: Positive for arthralgias.       Wrist pain  Skin: Negative.   Neurological: Negative.   Hematological: Negative.   Psychiatric/Behavioral: Negative.   All other systems reviewed and are negative.    BP 150/78  Pulse 66  Ht 5\' 9"  (1.753 m)  Wt 210 lb 1.9 oz (95.31 kg)  BMI 31.03 kg/m2  Physical Exam  Nursing note and vitals reviewed. Constitutional: He is oriented to person, place, and time. He appears well-developed and well-nourished.  HENT:  Head: Normocephalic.  Nose: Nose normal.  Mouth/Throat: Oropharynx is clear and moist.  Eyes: Conjunctivae are normal. Pupils are equal, round, and reactive to light.  Neck: Normal range of motion. Neck supple. No JVD present.  Cardiovascular: Normal rate, regular rhythm, S1 normal, S2 normal, normal heart sounds and intact distal pulses.  Exam reveals no gallop and no friction rub.   No murmur heard. Pulmonary/Chest: Effort normal and breath sounds normal. No respiratory distress. He has no wheezes. He has no  rales. He exhibits no tenderness.  Abdominal: Soft. Bowel sounds are normal. He exhibits no distension. There is no tenderness.  Musculoskeletal: Normal range of motion. He exhibits no edema and no tenderness.  Lymphadenopathy:    He has no cervical adenopathy.  Neurological: He is alert and oriented to person, place, and time. Coordination normal.  Skin: Skin is warm and dry. No rash noted. No erythema.  Psychiatric: He has a normal mood and affect. His behavior is normal. Judgment and thought content normal.           Assessment and Plan

## 2011-08-12 NOTE — Progress Notes (Signed)
Addended by: Erin Hearing on: 08/12/2011 03:22 PM   Modules accepted: Orders

## 2011-08-12 NOTE — Assessment & Plan Note (Signed)
Currently with no symptoms of angina. No further workup at this time. Continue current medication regimen. 

## 2011-08-12 NOTE — Assessment & Plan Note (Signed)
He does comment on having bilateral wrist discomfort consistent with carpal tunnel. I have asked him to discuss this with Dr. Jonny Ruiz. He may benefit from wrist splints at nighttime.

## 2011-08-12 NOTE — Patient Instructions (Addendum)
You are doing well.  Please restart simvastatin one a day Please increase the lisinopril HCT to twice a day  Please call us if you have new issues that need to be addressed before your next appt.  The office will contact you for a follow up Appt. In 6 months   The number for the primary care in [place] on [address] is :  [phone] Dr. Dan Humphreys and Dr. Darrick Huntsman

## 2011-08-12 NOTE — Assessment & Plan Note (Signed)
Blood pressure is elevated and he reports systolic pressures typically in the 150 range. He does report dizziness it is systolic pressure goes to be 120s. We have suggested he increase his lisinopril HCT 10/12.5 mg daily to b.i.d..

## 2011-08-13 ENCOUNTER — Encounter: Payer: Self-pay | Admitting: Cardiovascular Disease

## 2011-09-29 ENCOUNTER — Other Ambulatory Visit: Payer: Self-pay | Admitting: Internal Medicine

## 2011-10-01 ENCOUNTER — Ambulatory Visit: Payer: No Typology Code available for payment source | Admitting: Internal Medicine

## 2011-11-04 ENCOUNTER — Encounter: Payer: Self-pay | Admitting: Internal Medicine

## 2011-11-04 ENCOUNTER — Ambulatory Visit (INDEPENDENT_AMBULATORY_CARE_PROVIDER_SITE_OTHER): Payer: No Typology Code available for payment source | Admitting: Internal Medicine

## 2011-11-04 DIAGNOSIS — E785 Hyperlipidemia, unspecified: Secondary | ICD-10-CM

## 2011-11-04 DIAGNOSIS — M791 Myalgia, unspecified site: Secondary | ICD-10-CM

## 2011-11-04 DIAGNOSIS — E538 Deficiency of other specified B group vitamins: Secondary | ICD-10-CM

## 2011-11-04 DIAGNOSIS — IMO0001 Reserved for inherently not codable concepts without codable children: Secondary | ICD-10-CM

## 2011-11-04 DIAGNOSIS — IMO0002 Reserved for concepts with insufficient information to code with codable children: Secondary | ICD-10-CM

## 2011-11-04 DIAGNOSIS — M792 Neuralgia and neuritis, unspecified: Secondary | ICD-10-CM

## 2011-11-04 DIAGNOSIS — I10 Essential (primary) hypertension: Secondary | ICD-10-CM

## 2011-11-04 MED ORDER — CYANOCOBALAMIN 1000 MCG/ML IJ SOLN
1000.0000 ug | Freq: Once | INTRAMUSCULAR | Status: AC
  Administered 2011-11-04: 1000 ug via INTRAMUSCULAR

## 2011-11-04 NOTE — Patient Instructions (Signed)
Please stop SIMVASTATIN until labwork is completed.  We will call with results.  Follow up 1 month.

## 2011-11-05 ENCOUNTER — Encounter: Payer: Self-pay | Admitting: Internal Medicine

## 2011-11-05 DIAGNOSIS — M791 Myalgia, unspecified site: Secondary | ICD-10-CM | POA: Insufficient documentation

## 2011-11-05 DIAGNOSIS — E538 Deficiency of other specified B group vitamins: Secondary | ICD-10-CM | POA: Insufficient documentation

## 2011-11-05 DIAGNOSIS — M792 Neuralgia and neuritis, unspecified: Secondary | ICD-10-CM | POA: Insufficient documentation

## 2011-11-05 DIAGNOSIS — E785 Hyperlipidemia, unspecified: Secondary | ICD-10-CM | POA: Insufficient documentation

## 2011-11-05 LAB — LIPID PANEL
Cholesterol: 199 mg/dL (ref 0–200)
Total CHOL/HDL Ratio: 7
Triglycerides: 461 mg/dL — ABNORMAL HIGH (ref 0.0–149.0)
VLDL: 92.2 mg/dL — ABNORMAL HIGH (ref 0.0–40.0)

## 2011-11-05 LAB — LDL CHOLESTEROL, DIRECT: Direct LDL: 83.8 mg/dL

## 2011-11-05 LAB — CK: Total CK: 178 U/L (ref 7–232)

## 2011-11-05 LAB — COMPREHENSIVE METABOLIC PANEL
Albumin: 3.9 g/dL (ref 3.5–5.2)
Alkaline Phosphatase: 78 U/L (ref 39–117)
BUN: 13 mg/dL (ref 6–23)
Glucose, Bld: 87 mg/dL (ref 70–99)
Total Bilirubin: 0.6 mg/dL (ref 0.3–1.2)

## 2011-11-05 NOTE — Assessment & Plan Note (Signed)
Blood pressure is still slightly elevated above goal. Patient reports full compliance with his medications. He recently increased his dose of lisinopril hydrochlorothiazide to twice daily. We'll check renal function with labs today. Will have him followup in one month. Given that we will be stopping his simvastatin and changing to Crestor, we'll consider increasing his dose of amlodipine to 10 mg daily to help with blood pressure control.

## 2011-11-05 NOTE — Assessment & Plan Note (Signed)
Goal LDL less than 70. Patient is currently taking simvastatin. I am concerned about myalgia, especially given that he is also taking amlodipine.. Simvastatin. We'll check CMP, lipid profile, and total CK today. If labs are normal, will give him a trial of Crestor. Followup in one month.

## 2011-11-05 NOTE — Assessment & Plan Note (Signed)
Patient with diffuse myalgia. Concerned about potential side effect of simvastatin especially given that he is taking amlodipine. Will check CMP and CK with labs today. We'll have him stop simvastatin. After labs are back, if normal, we'll plan to transition him to Crestor. Followup one month.

## 2011-11-05 NOTE — Assessment & Plan Note (Signed)
Patient with chronic B12 deficiency. He has missed several monthly injections. Will give B12 shot today. Plan for monthly B12 shots.

## 2011-11-05 NOTE — Assessment & Plan Note (Signed)
Patient with left arm pain which occurs at night when lying flat. His description of symptoms seem most consistent with radicular pain. We discussed potentially getting plain x-rays of his cervical spine for initial evaluation. Given other ongoing issues, will hold off until next visit order these studies. He will followup in one month.

## 2011-11-05 NOTE — Progress Notes (Signed)
Subjective:    Patient ID: Travis Carey, male    DOB: [birth year], 71 y.o.   MRN: [id number]  HPI 71 year old male with history of hypertension, hyperlipidemia, coronary artery disease presents to establish care. He was previously seen by Dr. Melvyn Novas in her [facility] office but wanted to move closer to home. He reports that generally he has been doing well. However, he is concerned today about diffuse muscle pain. He has noticed this over the last several weeks. He reports that the pain is located in his thighs and upper arms. He denies any known injuries. Of note, he is taking simvastatin and amlodipine. He has been on these medications for several years. He denies any new medications. He denies any fever or chills. He denies any weakness. He denies any numbness.  He is also concerned today about a chronic history of left arm and hand pain. He notes this at night. He reports that when he lays flat in the bed he has pain extending down his left arm and into his hand. He occasionally has some numbness in his left hand. If he hangs his arm over the side of the bed the pain is relieved. 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 This has not been evaluated in the past.  In regards to his chronic hypertension, he reports good control of his blood pressure. He did not bring a record of blood pressures today. He reports full compliance with this medication. He denies any recent chest pain, palpitations, or headache.  Outpatient Encounter Prescriptions as of 11/04/2011  Medication Sig Dispense Refill  . amLODipine (NORVASC) 5 MG tablet Take 5 mg by mouth daily.      Marland Kitchen aspirin 81 MG tablet Take 1 tablet (81 mg total) by mouth daily.  90 tablet  4  . carvedilol (COREG) 12.5 MG tablet Take 1 tablet (12.5 mg total) by mouth 2 (two) times daily.  180 tablet  3  . clopidogrel (PLAVIX) 75 MG tablet Take 1 tablet (75 mg total) by mouth daily.  90 tablet  3   . cyanocobalamin (,VITAMIN B-12,) 1000 MCG/ML injection Inject 1,000 mcg into the muscle. 1 cc IM every month       . lisinopril-hydrochlorothiazide (PRINZIDE,ZESTORETIC) 10-12.5 MG per tablet Take 2 tablets by mouth daily.  60 tablet  11  . pantoprazole (PROTONIX) 40 MG tablet Take 1 tablet (40 mg total) by mouth daily.  90 tablet  3  . potassium chloride (KLOR-CON 10) 10 MEQ CR tablet Take 1 tablet (10 mEq total) by mouth daily.  90 tablet  3  . tiotropium (SPIRIVA HANDIHALER) 18 MCG inhalation capsule Place 1 capsule (18 mcg total) into inhaler and inhale daily.  30 capsule  12  . DISCONTD: simvastatin (ZOCOR) 40 MG tablet Take 1 tablet (40 mg total) by mouth at bedtime.  90 tablet  4   Facility-Administered Encounter Medications as of 11/04/2011  Medication Dose Route Frequency Provider Last Rate Last Dose  . cyanocobalamin ((VITAMIN B-12)) injection 1,000 mcg  1,000 mcg Intramuscular Once Shelia Media, MD   1,000 mcg at 11/04/11 1440    Review of Systems  Constitutional: Negative for fever, chills, activity change, appetite change, fatigue and unexpected weight change.  Eyes: Negative for visual disturbance.  Respiratory: Negative for cough and shortness of breath.   Cardiovascular: Negative for chest pain, palpitations and leg swelling.  Gastrointestinal: Negative for abdominal pain and abdominal  distention.  Genitourinary: Negative for dysuria, urgency and difficulty urinating.  Musculoskeletal: Positive for myalgias. Negative for arthralgias and gait problem.  Skin: Negative for color change and rash.  Hematological: Negative for adenopathy.  Psychiatric/Behavioral: Negative for sleep disturbance and dysphoric mood. The patient is not nervous/anxious.    BP 152/80  Pulse 72  Temp(Src) 98.1 F (36.7 C) (Oral)  Ht 5\' 8"  (1.727 m)  Wt 197 lb (89.359 kg)  BMI 29.95 kg/m2     Objective:   Physical Exam  Constitutional: He is oriented to person, place, and time. He appears  well-developed and well-nourished. No distress.  HENT:  Head: Normocephalic and atraumatic.  Right Ear: External ear normal.  Left Ear: External ear normal.  Nose: Nose normal.  Mouth/Throat: Oropharynx is clear and moist. No oropharyngeal exudate.  Eyes: Conjunctivae and EOM are normal. Pupils are equal, round, and reactive to light. Right eye exhibits no discharge. Left eye exhibits no discharge. No scleral icterus.  Neck: Normal range of motion. Neck supple. No tracheal deviation present. No thyromegaly present.  Cardiovascular: Normal rate, regular rhythm and normal heart sounds.  Exam reveals no gallop and no friction rub.   No murmur heard. Pulmonary/Chest: Effort normal and breath sounds normal. No respiratory distress. He has no wheezes. He has no rales. He exhibits no tenderness.  Musculoskeletal: Normal range of motion. He exhibits no edema.  Lymphadenopathy:    He has no cervical adenopathy.  Neurological: He is alert and oriented to person, place, and time. No cranial nerve deficit. Coordination normal.  Skin: Skin is warm and dry. No rash noted. He is not diaphoretic. No erythema. No pallor.  Psychiatric: He has a normal mood and affect. His behavior is normal. Judgment and thought content normal.          Assessment & Plan:

## 2011-11-10 ENCOUNTER — Other Ambulatory Visit: Payer: Self-pay | Admitting: *Deleted

## 2011-11-10 MED ORDER — ROSUVASTATIN CALCIUM 5 MG PO TABS: 5.0000 mg | ORAL_TABLET | Freq: Every day | ORAL | Status: DC

## 2011-11-10 NOTE — Telephone Encounter (Signed)
Rx sent to pharmacy. Patient notified. 

## 2011-11-22 ENCOUNTER — Ambulatory Visit: Payer: No Typology Code available for payment source | Admitting: Internal Medicine

## 2011-11-22 ENCOUNTER — Telehealth: Payer: Self-pay | Admitting: *Deleted

## 2011-11-22 NOTE — Telephone Encounter (Signed)
Agree with plan. If I have an opening in the morning, we can see him, ie cancellation.

## 2011-11-22 NOTE — Telephone Encounter (Signed)
Triage Record Num: [id number] Operator: Revonda Humphrey Patient Name: Travis Carey Call Date & Time: 11/22/2011 3:31:40PM Patient Phone: [phone] PCP: Ronna Polio Patient Gender: Male PCP Fax : [phone] Patient DOB: 11/13/1940 Practice Name: [facility] Reason for Call: Caller: Carolyn/Spouse; PCP: Ronna Polio; CB#: [phone]; ; ; Call regarding Head Cold, Can Antibiotic Be Called in To [facility]; Patient calling about runny nose w/brown mucus, cough occassional, headache sinus around eyes nose starting Sat 11/20/2011. Runny nose dripping if bending over. Thinks maybe fever low grade last night but no thermometer to check temp. Up some times duing the night "spitting and carrying on", sneezing, Drainage down back of throat. Coughing and sneezing out brownish mucus. Some burning in chest - concern about possible bronchitis in the future if not given something for it now.... usually given Antibiotic and "wants something called in and call back ASAP" -- "has been trying to get this all day" WANTING PRESCRIPTION CALLED IN. Uses [facility] [address] [phone]. Can reach caller, patient at [phone]. Protocol(s) Used: Upper Respiratory Infection (URI) Recommended Outcome per Protocol: See Provider within 24 hours Reason for Outcome: Productive cough with colored sputum (other than clear or white sputum) Care Advice: ~ 11/22/2011 4:21:33PM Page 1 of 1 [id number]

## 2011-11-22 NOTE — Telephone Encounter (Signed)
Reason for Call: Has spoken to a nurse and does not want to speak to another. Wants antibiotic called for head congestion. Told wife that nurse would send info that was gathered previously.

## 2011-11-22 NOTE — Telephone Encounter (Signed)
I called the patient - His wife answered and refused to allow me to speak with pt directly. She was extremely rude and disrespectful. She stated that the patient had been "calling all day". After asking multiple times she asked the patient who said his first call was around 12 today. I explained that to call in an antibiotic the patient would need to see the MD and I could offer an apt. She said "he didn't ask for an apt" and then said our office told him the next open apt was not for 2 weeks. She continued being very rude and complaining that I was the 4 th nurse they had talked to. I tried to again offer an apt, she said she was "tired of talking, not sure if they had made the right choice by switching" and hung up on me.    I called back and tried to speak with the patient. I explained to the wife that Travis Carey did not put her on the HIPPA form, he only put his daughter's name so I was unable to speak w/her regarding his health. She put Travis Luca on the phone. I explained to the patient that we would need to see him for an apt to determine the need for antibiotics. He was calm and understanding and I scheduled apt w/Dr Darrick Huntsman tomorrow AM in open apt.

## 2011-11-23 ENCOUNTER — Ambulatory Visit (INDEPENDENT_AMBULATORY_CARE_PROVIDER_SITE_OTHER): Payer: No Typology Code available for payment source | Admitting: Internal Medicine

## 2011-11-23 ENCOUNTER — Encounter: Payer: Self-pay | Admitting: Internal Medicine

## 2011-11-23 VITALS — BP 160/78 | HR 94 | Temp 98.2°F | Wt 202.0 lb

## 2011-11-23 DIAGNOSIS — H6690 Otitis media, unspecified, unspecified ear: Secondary | ICD-10-CM

## 2011-11-23 DIAGNOSIS — I10 Essential (primary) hypertension: Secondary | ICD-10-CM

## 2011-11-23 DIAGNOSIS — J329 Chronic sinusitis, unspecified: Secondary | ICD-10-CM

## 2011-11-23 DIAGNOSIS — J069 Acute upper respiratory infection, unspecified: Secondary | ICD-10-CM

## 2011-11-23 MED ORDER — PREDNISONE (PAK) 10 MG PO TABS: ORAL_TABLET | ORAL | Status: AC

## 2011-11-23 MED ORDER — BENZONATATE 200 MG PO CAPS: 200.0000 mg | ORAL_CAPSULE | Freq: Three times a day (TID) | ORAL | Status: AC | PRN

## 2011-11-23 MED ORDER — AMOXICILLIN-POT CLAVULANATE 875-125 MG PO TABS: 1.0000 | ORAL_TABLET | Freq: Two times a day (BID) | ORAL | Status: AC

## 2011-11-23 NOTE — Patient Instructions (Signed)
You havre an early ear infection.  Take the augmentin twice daily with food for 7 days.  Take the prednisone taper :  6 pills all at once on Day 1,  5 pills the next day,  4 the next,  And so on  Use Afrin nasal spray (OTC) twice daily for 5 days,  Then stop  The cough pill can be taken every 8 hours for cough.

## 2011-11-23 NOTE — Progress Notes (Signed)
Subjective:    Patient ID: Travis Carey, male    DOB: 12/04/1940, 71 y.o.   MRN: [id number]  HPI 71 yr old male presents with symptoms of URI which started 4 days ago he used several doses of Robitussin TE but stopped because his blood pressure was elevated at 179/80 he reports cough productive of yellow-green sputum and and a burning in his chest. He has noted some wheezing; he has a history of of bronchitis.   he denies any hospital or nursing home contacts..  heReceived the influenza vaccine this season he does not have a thermometer but denies any feeling of high fevers or intense myalgias.  Past Medical History  Diagnosis Date  . VITAMIN B12 DEFICIENCY 02/09/2010  . GLUCOSE INTOLERANCE 01/28/2010  . HYPERLIPIDEMIA 02/07/2008  . HYPOKALEMIA 01/28/2010  . ANEMIA-NOS 01/28/2010  . ERECTILE DYSFUNCTION 02/24/2009  . DEPRESSION 06/12/2007  . Carpal tunnel syndrome 05/20/2008  . HYPERTENSION 06/09/2007  . CAD 02/17/2010  . HEMORRHOIDS 06/09/2007  . URI 08/19/2008  . GERD 06/09/2007  . SHOULDER PAIN, LEFT 04/25/2008  . LOW BACK PAIN 06/12/2007  . PARESTHESIA 04/25/2008  . CAROTID BRUIT 11/18/2009  . DYSPNEA 07/28/2010  . CHEST PAIN 09/26/2009  . MYOCARDIAL PERFUSION SCAN, WITH STRESS TEST, ABNORMAL 10/08/2009  . Impaired glucose tolerance 01/23/2011   Current Outpatient Prescriptions on File Prior to Visit  Medication Sig Dispense Refill  . amLODipine (NORVASC) 5 MG tablet Take 5 mg by mouth daily.      Marland Kitchen aspirin 81 MG tablet Take 1 tablet (81 mg total) by mouth daily.  90 tablet  4  . carvedilol (COREG) 12.5 MG tablet Take 1 tablet (12.5 mg total) by mouth 2 (two) times daily.  180 tablet  3  . clopidogrel (PLAVIX) 75 MG tablet Take 1 tablet (75 mg total) by mouth daily.  90 tablet  3  . cyanocobalamin (,VITAMIN B-12,) 1000 MCG/ML injection Inject 1,000 mcg into the muscle. 1 cc IM every month       . lisinopril-hydrochlorothiazide (PRINZIDE,ZESTORETIC) 10-12.5 MG per tablet Take 2 tablets by mouth  daily.  60 tablet  11  . pantoprazole (PROTONIX) 40 MG tablet Take 1 tablet (40 mg total) by mouth daily.  90 tablet  3  . potassium chloride (KLOR-CON 10) 10 MEQ CR tablet Take 1 tablet (10 mEq total) by mouth daily.  90 tablet  3  . rosuvastatin (CRESTOR) 5 MG tablet Take 1 tablet (5 mg total) by mouth daily.  30 tablet  2  . tiotropium (SPIRIVA HANDIHALER) 18 MCG inhalation capsule Place 1 capsule (18 mcg total) into inhaler and inhale daily.  30 capsule  12   Review of Systems  Constitutional: Positive for fever, chills and fatigue.  HENT: Positive for congestion, rhinorrhea, sneezing, postnasal drip and sinus pressure.   Eyes: Positive for redness.  Respiratory: Positive for cough and wheezing.   Cardiovascular: Negative.   Gastrointestinal: Negative.   Genitourinary: Negative.   Musculoskeletal: Negative.   Skin: Negative.   Neurological: Negative.   Hematological: Negative.   Psychiatric/Behavioral: Negative.    BP 160/78  Pulse 94  Temp(Src) 98.2 F (36.8 C) (Oral)  Wt 202 lb (91.627 kg)  SpO2 96%      Objective:   Physical Exam  Constitutional: He is oriented to person, place, and time.  HENT:  Head: Normocephalic and atraumatic.  Right Ear: Ear canal normal. Tympanic membrane is injected. A middle ear effusion is present.  Left Ear: Tympanic membrane and ear  canal normal.  Mouth/Throat: Oropharynx is clear and moist.  Eyes: Conjunctivae and EOM are normal.  Neck: Normal range of motion. Neck supple. No JVD present. No thyromegaly present.  Cardiovascular: Normal rate, regular rhythm and normal heart sounds.   Pulmonary/Chest: Effort normal. He has wheezes. He has no rales.  Abdominal: Soft. Bowel sounds are normal. He exhibits no mass. There is no tenderness. There is no rebound.  Musculoskeletal: Normal range of motion. He exhibits no edema.  Neurological: He is alert and oriented to person, place, and time.  Skin: Skin is warm and dry.  Psychiatric: He has a  normal mood and affect.      Assessment & Plan:  Sinusitis with cough and otitis on Right:  Occasional wheezing.  Steroids, abx, albuterol.

## 2011-11-24 DIAGNOSIS — J069 Acute upper respiratory infection, unspecified: Secondary | ICD-10-CM | POA: Insufficient documentation

## 2011-11-24 NOTE — Assessment & Plan Note (Signed)
Not controlled currently due to acute infection.  He will return in two weeks for bp check.

## 2011-11-24 NOTE — Assessment & Plan Note (Signed)
With sinusitis, will treat with empiric antibiotics steroids since he is hypertensive on decongestants and albuterol MDI.

## 2011-12-08 ENCOUNTER — Ambulatory Visit (INDEPENDENT_AMBULATORY_CARE_PROVIDER_SITE_OTHER): Payer: No Typology Code available for payment source | Admitting: Internal Medicine

## 2011-12-08 ENCOUNTER — Encounter: Payer: Self-pay | Admitting: Internal Medicine

## 2011-12-08 ENCOUNTER — Ambulatory Visit (INDEPENDENT_AMBULATORY_CARE_PROVIDER_SITE_OTHER)
Admission: RE | Admit: 2011-12-08 | Discharge: 2011-12-08 | Disposition: A | Discharge status: Home / Self Care | Payer: No Typology Code available for payment source | Source: Ambulatory Visit | Attending: Internal Medicine | Admitting: Internal Medicine

## 2011-12-08 DIAGNOSIS — R05 Cough: Secondary | ICD-10-CM

## 2011-12-08 DIAGNOSIS — R202 Paresthesia of skin: Secondary | ICD-10-CM | POA: Insufficient documentation

## 2011-12-08 DIAGNOSIS — M791 Myalgia, unspecified site: Secondary | ICD-10-CM

## 2011-12-08 DIAGNOSIS — I10 Essential (primary) hypertension: Secondary | ICD-10-CM

## 2011-12-08 DIAGNOSIS — R059 Cough, unspecified: Secondary | ICD-10-CM

## 2011-12-08 DIAGNOSIS — R209 Unspecified disturbances of skin sensation: Secondary | ICD-10-CM

## 2011-12-08 DIAGNOSIS — K219 Gastro-esophageal reflux disease without esophagitis: Secondary | ICD-10-CM

## 2011-12-08 DIAGNOSIS — IMO0001 Reserved for inherently not codable concepts without codable children: Secondary | ICD-10-CM

## 2011-12-08 MED ORDER — DEXLANSOPRAZOLE 60 MG PO CPDR: 60.0000 mg | DELAYED_RELEASE_CAPSULE | Freq: Every day | ORAL | Status: DC

## 2011-12-08 MED ORDER — AMLODIPINE BESYLATE 5 MG PO TABS: 5.0000 mg | ORAL_TABLET | Freq: Every day | ORAL | Status: DC

## 2011-12-08 NOTE — Assessment & Plan Note (Signed)
Symptoms of myalgia have resolved with stopping simvastatin. Patient will continue on Crestor. He will call or return to clinic if symptoms recur.

## 2011-12-08 NOTE — Assessment & Plan Note (Signed)
Patient with long-standing numbness extending down both arms. Symptoms worsened with raising arms above his head. Suspect radiculopathy. Will get plain x-ray of cervical spine today. Will likely need to proceed with MRI for further evaluation.

## 2011-12-08 NOTE — Assessment & Plan Note (Signed)
Patient reports some persistent symptoms of GERD with use of Protonix. He occasionally misses a dose, which may be contributing. Will try changing to Dexilant. If no improvement, consider referral for EGD.

## 2011-12-08 NOTE — Assessment & Plan Note (Signed)
Blood pressure is elevated today but patient reports she has not been taking his amlodipine. Will refill his amlodipine today. Followup in one month.

## 2011-12-08 NOTE — Progress Notes (Signed)
Subjective:    Patient ID: Travis Carey, male    DOB: 09/24/1941, 71 y.o.   MRN: [id number]  HPI 71 year old male with history of hyperlipidemia, hypertension presents for followup. He was recently seen in our office with otitis media. He reports that his symptoms have improved after antibiotic treatment, however he does have some residual nonproductive cough. He denies shortness of breath. He denies chest pain. He denies fever or chills.  In regards to his hypertension, he reports that he has not been taking his amlodipine because he ran out of this medication. He denies any chest pain, palpitations, or headache.  In regards to his hyperlipidemia, he reports that his myalgia resolved with changing from simvastatin to Crestor.  He continues to be concerned about bilateral arm numbness. This is described as numbness which radiates down both arms and is worsened with lifting his arms. He notes that he has had EMG testing in the past which she reports was normal. He has never had imaging of his cervical spine. He reports some weakness in his grip. He occasionally has pain radiating down his arms.  Outpatient Encounter Prescriptions as of 12/08/2011  Medication Sig Dispense Refill  . aspirin 81 MG tablet Take 1 tablet (81 mg total) by mouth daily.  90 tablet  4  . carvedilol (COREG) 12.5 MG tablet Take 1 tablet (12.5 mg total) by mouth 2 (two) times daily.  180 tablet  3  . clopidogrel (PLAVIX) 75 MG tablet Take 1 tablet (75 mg total) by mouth daily.  90 tablet  3  . cyanocobalamin (,VITAMIN B-12,) 1000 MCG/ML injection Inject 1,000 mcg into the muscle. 1 cc IM every month       . lisinopril-hydrochlorothiazide (PRINZIDE,ZESTORETIC) 10-12.5 MG per tablet Take 1 tablet by mouth 2 (two) times daily.      . potassium chloride (KLOR-CON 10) 10 MEQ CR tablet Take 1 tablet (10 mEq total) by mouth daily.  90 tablet  3  . rosuvastatin (CRESTOR) 5 MG tablet Take 1 tablet (5 mg total) by mouth daily.  30  tablet  2  . tiotropium (SPIRIVA HANDIHALER) 18 MCG inhalation capsule Place 1 capsule (18 mcg total) into inhaler and inhale daily.  30 capsule  12  . DISCONTD: lisinopril-hydrochlorothiazide (PRINZIDE,ZESTORETIC) 10-12.5 MG per tablet Take 2 tablets by mouth daily.  60 tablet  11  . DISCONTD: pantoprazole (PROTONIX) 40 MG tablet Take 1 tablet (40 mg total) by mouth daily.  90 tablet  3  . amLODipine (NORVASC) 5 MG tablet Take 1 tablet (5 mg total) by mouth daily.  30 tablet  3  . dexlansoprazole (DEXILANT) 60 MG capsule Take 1 capsule (60 mg total) by mouth daily.  30 capsule  3  . DISCONTD: amLODipine (NORVASC) 5 MG tablet Take 5 mg by mouth daily.        Review of Systems  Constitutional: Negative for fever, chills, activity change, appetite change, fatigue and unexpected weight change.  HENT: Negative for neck pain and neck stiffness.   Eyes: Negative for visual disturbance.  Respiratory: Positive for cough. Negative for shortness of breath.   Cardiovascular: Negative for chest pain, palpitations and leg swelling.  Gastrointestinal: Negative for abdominal pain and abdominal distention.  Genitourinary: Negative for dysuria, urgency and difficulty urinating.  Musculoskeletal: Positive for myalgias and arthralgias. Negative for gait problem.  Skin: Negative for color change and rash.  Neurological: Positive for numbness.  Hematological: Negative for adenopathy.  Psychiatric/Behavioral: Negative for sleep disturbance and dysphoric mood.  The patient is not nervous/anxious.    BP 152/72  Pulse 67  Temp(Src) 98.1 F (36.7 C) (Oral)  Ht 5\' 8"  (1.727 m)  Wt 197 lb (89.359 kg)  BMI 29.95 kg/m2  SpO2 96%     Objective:   Physical Exam  Constitutional: He is oriented to person, place, and time. He appears well-developed and well-nourished. No distress.  HENT:  Head: Normocephalic and atraumatic.  Right Ear: External ear normal.  Left Ear: External ear normal.  Nose: Nose normal.    Mouth/Throat: Oropharynx is clear and moist. No oropharyngeal exudate.  Eyes: Conjunctivae and EOM are normal. Pupils are equal, round, and reactive to light. Right eye exhibits no discharge. Left eye exhibits no discharge. No scleral icterus.  Neck: Normal range of motion. Neck supple. No tracheal deviation present. No thyromegaly present.  Cardiovascular: Normal rate, regular rhythm and normal heart sounds.  Exam reveals no gallop and no friction rub.   No murmur heard. Pulmonary/Chest: Effort normal and breath sounds normal. No respiratory distress. He has no wheezes. He has no rales. He exhibits no tenderness.  Musculoskeletal: Normal range of motion. He exhibits no edema.  Lymphadenopathy:    He has no cervical adenopathy.  Neurological: He is alert and oriented to person, place, and time. No cranial nerve deficit. Coordination normal.  Skin: Skin is warm and dry. No rash noted. He is not diaphoretic. No erythema. No pallor.  Psychiatric: He has a normal mood and affect. His behavior is normal. Judgment and thought content normal.          Assessment & Plan:  and

## 2011-12-08 NOTE — Patient Instructions (Signed)
Take Dexilant 60mg  daily in place of Protonix. Please call to let us know if any improvement in reflux.  Xrays today. We will call you with results.  Follow up 1 month.

## 2011-12-08 NOTE — Assessment & Plan Note (Signed)
Patient reports residual cough after his recent episode of otitis media. Exam is normal today. Suspect this is related to ongoing postnasal drip from viral infection. However, will get chest x-ray today.

## 2011-12-10 ENCOUNTER — Telehealth: Payer: Self-pay | Admitting: *Deleted

## 2011-12-10 DIAGNOSIS — M47812 Spondylosis without myelopathy or radiculopathy, cervical region: Secondary | ICD-10-CM

## 2011-12-10 DIAGNOSIS — R202 Paresthesia of skin: Secondary | ICD-10-CM

## 2011-12-10 NOTE — Telephone Encounter (Signed)
Message copied by Vernie Murders on Fri Dec 10, 2011  5:05 PM ------      Message from: Ronna Polio A      Created: Wed Dec 08, 2011  3:56 PM       Xray showed degenerative changes, given symptoms, needs to have MRI cervical spine for further evaluation.

## 2011-12-10 NOTE — Telephone Encounter (Signed)
Patient informed, Referral entered  

## 2011-12-24 ENCOUNTER — Telehealth: Payer: Self-pay | Admitting: Internal Medicine

## 2011-12-24 NOTE — Telephone Encounter (Signed)
Office Message [address] [place], [place] [address] p. [phone] f. [phone] To: [facility] Fax: [phone] From: [facility] Date/ Time: 12/23/2011 5:22 PM Taken By: Eduardo Osier, CSR Caller: Eber Jones Facility: not collected Patient: Travis Carey, Travis Carey DOB: 1941/08/02 Phone: [phone] Reason for Call: Pt's wife called said pt is scheduled for a MRI in the morning at 9:45. Someone left a msg for him regarding this appt around 4:15 today and wife said that is too late of a notice and he cannot make that appt. She asked that someone call back to reschedule this and to please give a 24 hr notice of appt so he can have time to make arrangements. Regarding Appointment: Yes Appt Date: Appt Time: Unknown Provider: Reason: Details: Outcome: confidential

## 2011-12-24 NOTE — Telephone Encounter (Signed)
I have called [facility] and rescheduled the appt.  I spoke with Talbert Forest at [facility] 12/29/11 at 11:45 arrive at 11:00 at the hospital.  Patient is aware of the appointment.

## 2011-12-29 ENCOUNTER — Ambulatory Visit: Payer: Self-pay | Admitting: Internal Medicine

## 2011-12-30 ENCOUNTER — Telehealth: Payer: Self-pay | Admitting: Internal Medicine

## 2011-12-30 DIAGNOSIS — M47812 Spondylosis without myelopathy or radiculopathy, cervical region: Secondary | ICD-10-CM

## 2011-12-30 NOTE — Telephone Encounter (Signed)
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 I would like to set him up with neurosurgery.

## 2011-12-31 NOTE — Telephone Encounter (Signed)
Left message asking patient to call the office.  

## 2011-12-31 NOTE — Telephone Encounter (Signed)
Patient informed, referral entered 

## 2012-01-03 ENCOUNTER — Telehealth: Payer: Self-pay | Admitting: Internal Medicine

## 2012-01-03 NOTE — Telephone Encounter (Signed)
I have faxed a request to Travis Carey at [facility] to get a copy of MRI.

## 2012-01-04 ENCOUNTER — Encounter: Payer: Self-pay | Admitting: Internal Medicine

## 2012-01-04 ENCOUNTER — Telehealth: Payer: Self-pay | Admitting: Internal Medicine

## 2012-01-04 DIAGNOSIS — M4802 Spinal stenosis, cervical region: Secondary | ICD-10-CM

## 2012-01-04 NOTE — Telephone Encounter (Signed)
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 I would like to set up with neurosurgery for evaluation.

## 2012-01-04 NOTE — Telephone Encounter (Signed)
Called to notify patient, a male answered the phone and said that he is not available and that he has an appt with Dr. Dan Humphreys tomorrow. She says we obviously do not keep up with what is going on in our office and hung up. I tried calling back  X 2 but got no answer. I did not realized patient had an appt for tomorrow when I called. Patient can discuss at visit tomorrow.

## 2012-01-05 ENCOUNTER — Ambulatory Visit (INDEPENDENT_AMBULATORY_CARE_PROVIDER_SITE_OTHER): Payer: No Typology Code available for payment source | Admitting: Internal Medicine

## 2012-01-05 ENCOUNTER — Encounter: Payer: Self-pay | Admitting: Internal Medicine

## 2012-01-05 VITALS — BP 160/82 | HR 96 | Temp 98.0°F | Ht 68.0 in | Wt 199.0 lb

## 2012-01-05 DIAGNOSIS — I10 Essential (primary) hypertension: Secondary | ICD-10-CM

## 2012-01-05 DIAGNOSIS — IMO0002 Reserved for concepts with insufficient information to code with codable children: Secondary | ICD-10-CM

## 2012-01-05 DIAGNOSIS — R209 Unspecified disturbances of skin sensation: Secondary | ICD-10-CM

## 2012-01-05 DIAGNOSIS — M5410 Radiculopathy, site unspecified: Secondary | ICD-10-CM

## 2012-01-05 DIAGNOSIS — E785 Hyperlipidemia, unspecified: Secondary | ICD-10-CM

## 2012-01-05 DIAGNOSIS — K219 Gastro-esophageal reflux disease without esophagitis: Secondary | ICD-10-CM

## 2012-01-05 DIAGNOSIS — R202 Paresthesia of skin: Secondary | ICD-10-CM

## 2012-01-05 NOTE — Assessment & Plan Note (Signed)
Will recheck his cholesterol today. Goal LDL less than 70.

## 2012-01-05 NOTE — Telephone Encounter (Signed)
This has been sent to them already per Marj.

## 2012-01-05 NOTE — Assessment & Plan Note (Signed)
No improvement with the use of Dexilant. Will try using Nexium. If persisting, will set up with GI for endoscopy.

## 2012-01-05 NOTE — Progress Notes (Signed)
Subjective:    Patient ID: Travis Carey, male    DOB: 1940-12-16, 71 y.o.   MRN: [id number]  HPI   71 year old male with history of hypertension, HL, CAD, pernicious anemia, and paresthesias of his left arm presents for followup. He recently underwent MRI of his cervical spine which showed degenerative joint disease and compression of the thecal sac at C5-6 and C6-7. He reports that the paresthesias are unchanged. As noted in the past, he occasionally notes weakness of his left hand. We discussed that he will need referral to neurosurgery for evaluation.  In regards to his history of GERD, he reports no improvement with the use of Dexilant. He reports better improvement in his symptoms of epigastric pain with the use of omeprazole. He denies any nausea, vomiting, change in his stools.  Outpatient Encounter Prescriptions as of 01/05/2012  Medication Sig Dispense Refill  . amLODipine (NORVASC) 5 MG tablet Take 1 tablet (5 mg total) by mouth daily.  30 tablet  3  . aspirin 81 MG tablet Take 1 tablet (81 mg total) by mouth daily.  90 tablet  4  . carvedilol (COREG) 12.5 MG tablet Take 1 tablet (12.5 mg total) by mouth 2 (two) times daily.  180 tablet  3  . clopidogrel (PLAVIX) 75 MG tablet Take 1 tablet (75 mg total) by mouth daily.  90 tablet  3  . cyanocobalamin (,VITAMIN B-12,) 1000 MCG/ML injection Inject 1,000 mcg into the muscle. 1 cc IM every month       . dexlansoprazole (DEXILANT) 60 MG capsule Take 1 capsule (60 mg total) by mouth daily.  30 capsule  3  . lisinopril-hydrochlorothiazide (PRINZIDE,ZESTORETIC) 10-12.5 MG per tablet Take 1 tablet by mouth 2 (two) times daily.      . potassium chloride (KLOR-CON 10) 10 MEQ CR tablet Take 1 tablet (10 mEq total) by mouth daily.  90 tablet  3  . rosuvastatin (CRESTOR) 5 MG tablet Take 1 tablet (5 mg total) by mouth daily.  30 tablet  2  . tiotropium (SPIRIVA HANDIHALER) 18 MCG inhalation capsule Place 1 capsule (18 mcg total) into inhaler and  inhale daily.  30 capsule  12    Review of Systems  Constitutional: Negative for fever, chills, activity change, appetite change, fatigue and unexpected weight change.  Eyes: Negative for visual disturbance.  Respiratory: Negative for cough and shortness of breath.   Cardiovascular: Negative for chest pain, palpitations and leg swelling.  Gastrointestinal: Positive for abdominal pain. Negative for abdominal distention.  Genitourinary: Negative for dysuria, urgency and difficulty urinating.  Musculoskeletal: Positive for myalgias. Negative for arthralgias and gait problem.  Skin: Negative for color change and rash.  Neurological: Positive for weakness.  Hematological: Negative for adenopathy.  Psychiatric/Behavioral: Negative for sleep disturbance and dysphoric mood. The patient is not nervous/anxious.        Objective:   Physical Exam  Constitutional: He is oriented to person, place, and time. He appears well-developed and well-nourished. No distress.  HENT:  Head: Normocephalic and atraumatic.  Right Ear: External ear normal.  Left Ear: External ear normal.  Nose: Nose normal.  Mouth/Throat: Oropharynx is clear and moist. No oropharyngeal exudate.  Eyes: Conjunctivae and EOM are normal. Pupils are equal, round, and reactive to light. Right eye exhibits no discharge. Left eye exhibits no discharge. No scleral icterus.  Neck: Normal range of motion. Neck supple. No tracheal deviation present. No thyromegaly present.  Cardiovascular: Normal rate, regular rhythm and normal heart sounds.  Exam  reveals no gallop and no friction rub.   No murmur heard. Pulmonary/Chest: Effort normal and breath sounds normal. No respiratory distress. He has no wheezes. He has no rales. He exhibits no tenderness.  Musculoskeletal: Normal range of motion. He exhibits no edema.  Lymphadenopathy:    He has no cervical adenopathy.  Neurological: He is alert and oriented to person, place, and time. No cranial  nerve deficit. Coordination normal.  Skin: Skin is warm and dry. No rash noted. He is not diaphoretic. No erythema. No pallor.  Psychiatric: He has a normal mood and affect. His behavior is normal. Judgment and thought content normal.          Assessment & Plan:

## 2012-01-05 NOTE — Assessment & Plan Note (Signed)
Blood pressure continues to be elevated. Will have him increase dose of amlodipine to 10 mg daily.

## 2012-01-05 NOTE — Assessment & Plan Note (Signed)
MRI cervical spine showed DJD with thecal sac compression at C5-6 and C6-7.  Recommended NSU evaluation. Will set this up for him.

## 2012-01-06 ENCOUNTER — Telehealth: Payer: Self-pay | Admitting: *Deleted

## 2012-01-06 LAB — COMPREHENSIVE METABOLIC PANEL
AST: 23 U/L (ref 0–37)
Alkaline Phosphatase: 71 U/L (ref 39–117)
BUN: 13 mg/dL (ref 6–23)
Creatinine, Ser: 1.1 mg/dL (ref 0.4–1.5)
Glucose, Bld: 78 mg/dL (ref 70–99)

## 2012-01-06 LAB — LIPID PANEL
Cholesterol: 157 mg/dL (ref 0–200)
HDL: 37 mg/dL — ABNORMAL LOW (ref >39.00)
Total CHOL/HDL Ratio: 4
Triglycerides: 210 mg/dL — ABNORMAL HIGH (ref 0.0–149.0)

## 2012-01-06 LAB — LDL CHOLESTEROL, DIRECT: Direct LDL: 91.8 mg/dL

## 2012-01-06 MED ORDER — AMLODIPINE BESYLATE 10 MG PO TABS: 10.0000 mg | ORAL_TABLET | Freq: Every day | ORAL | Status: DC

## 2012-01-06 NOTE — Telephone Encounter (Signed)
Message copied by Vernie Murders on Thu Jan 06, 2012  8:15 AM ------      Message from: Ronna Polio A      Created: Wed Jan 05, 2012  7:53 PM       Forgot to tell pt to increase Amlodipine to 10mg  daily.  BP was high.  Also, needs follow up 1 month to recheck BP.

## 2012-01-06 NOTE — Telephone Encounter (Signed)
Patient informed, new RX sent in

## 2012-02-08 ENCOUNTER — Telehealth: Payer: Self-pay | Admitting: *Deleted

## 2012-02-08 NOTE — Telephone Encounter (Signed)
Pt inquiring about B12 injections, established care 01.24.13 [OV note states Pt has missed several months & was given injection], was suppose to f/u in [1] month. Informed patient of this and scheduled nurse visit for injection Tues, 05.01.13 at 11:00am/SLS

## 2012-02-09 ENCOUNTER — Ambulatory Visit (INDEPENDENT_AMBULATORY_CARE_PROVIDER_SITE_OTHER): Payer: No Typology Code available for payment source | Admitting: *Deleted

## 2012-02-09 DIAGNOSIS — E538 Deficiency of other specified B group vitamins: Secondary | ICD-10-CM

## 2012-02-09 MED ORDER — CYANOCOBALAMIN 1000 MCG/ML IJ SOLN
1000.0000 ug | Freq: Once | INTRAMUSCULAR | Status: AC
  Administered 2012-02-09: 1000 ug via INTRAMUSCULAR

## 2012-03-02 ENCOUNTER — Telehealth: Payer: Self-pay | Admitting: Internal Medicine

## 2012-03-02 NOTE — Telephone Encounter (Signed)
Message copied by Etheleen Sia on Thu Mar 02, 2012 12:27 PM ------      Message from: Etheleen Sia      Created: Thu Mar 02, 2012 12:25 PM      Regarding: FW: SWITCH FROM DR Dan Humphreys       Pt is aware and made an appt for July 8.      ----- Message -----         From: Corwin Levins, MD         Sent: 03/02/2012  11:58 AM           To: Etheleen Sia      Subject: RE: SWITCH FROM DR Marjean Donna with me      ----- Message -----         From: Etheleen Sia         Sent: 03/01/2012   2:19 PM           To: Corwin Levins, MD      Subject: [facility] FROM DR Cathlean [place] has been seeing Dr. Dan Humphreys.  He wants to switch back to Dr. Jonny Ruiz.

## 2012-03-19 ENCOUNTER — Other Ambulatory Visit: Payer: Self-pay | Admitting: Internal Medicine

## 2012-04-10 ENCOUNTER — Ambulatory Visit: Payer: No Typology Code available for payment source | Admitting: Internal Medicine

## 2012-04-17 ENCOUNTER — Other Ambulatory Visit (INDEPENDENT_AMBULATORY_CARE_PROVIDER_SITE_OTHER): Payer: No Typology Code available for payment source

## 2012-04-17 ENCOUNTER — Encounter: Payer: Self-pay | Admitting: Internal Medicine

## 2012-04-17 ENCOUNTER — Ambulatory Visit (INDEPENDENT_AMBULATORY_CARE_PROVIDER_SITE_OTHER): Payer: No Typology Code available for payment source | Admitting: Internal Medicine

## 2012-04-17 VITALS — BP 132/70 | HR 64 | Temp 97.2°F | Ht 68.0 in | Wt 202.5 lb

## 2012-04-17 DIAGNOSIS — E538 Deficiency of other specified B group vitamins: Secondary | ICD-10-CM

## 2012-04-17 DIAGNOSIS — R7302 Impaired glucose tolerance (oral): Secondary | ICD-10-CM

## 2012-04-17 DIAGNOSIS — Z0000 Encounter for general adult medical examination without abnormal findings: Secondary | ICD-10-CM

## 2012-04-17 DIAGNOSIS — M25569 Pain in unspecified knee: Secondary | ICD-10-CM

## 2012-04-17 DIAGNOSIS — I10 Essential (primary) hypertension: Secondary | ICD-10-CM

## 2012-04-17 DIAGNOSIS — R7309 Other abnormal glucose: Secondary | ICD-10-CM

## 2012-04-17 DIAGNOSIS — M5090 Cervical disc disorder, unspecified, unspecified cervical region: Secondary | ICD-10-CM

## 2012-04-17 DIAGNOSIS — M25562 Pain in left knee: Secondary | ICD-10-CM

## 2012-04-17 DIAGNOSIS — M5412 Radiculopathy, cervical region: Secondary | ICD-10-CM

## 2012-04-17 HISTORY — DX: Radiculopathy, cervical region: M54.12

## 2012-04-17 LAB — CBC WITH DIFFERENTIAL/PLATELET
Basophils Relative: 0.7 % (ref 0.0–3.0)
HCT: 38.6 % — ABNORMAL LOW (ref 39.0–52.0)
Hemoglobin: 13 g/dL (ref 13.0–17.0)
Lymphocytes Relative: 39.8 % (ref 12.0–46.0)
MCHC: 33.6 g/dL (ref 30.0–36.0)
Monocytes Relative: 9.5 % (ref 3.0–12.0)
Neutro Abs: 2.4 10*3/uL (ref 1.4–7.7)
RBC: 4.28 Mil/uL (ref 4.22–5.81)

## 2012-04-17 LAB — BASIC METABOLIC PANEL
CO2: 30 mEq/L (ref 19–32)
Calcium: 9.4 mg/dL (ref 8.4–10.5)
Potassium: 3.3 mEq/L — ABNORMAL LOW (ref 3.5–5.1)
Sodium: 140 mEq/L (ref 135–145)

## 2012-04-17 LAB — LDL CHOLESTEROL, DIRECT: Direct LDL: 83.9 mg/dL

## 2012-04-17 LAB — HEPATIC FUNCTION PANEL
AST: 23 U/L (ref 0–37)
Albumin: 3.8 g/dL (ref 3.5–5.2)

## 2012-04-17 LAB — LIPID PANEL
Total CHOL/HDL Ratio: 5
VLDL: 57.8 mg/dL — ABNORMAL HIGH (ref 0.0–40.0)

## 2012-04-17 MED ORDER — CYANOCOBALAMIN 1000 MCG/ML IJ SOLN
1000.0000 ug | Freq: Once | INTRAMUSCULAR | Status: AC
  Administered 2012-04-17: 1000 ug via INTRAMUSCULAR

## 2012-04-17 NOTE — Patient Instructions (Addendum)
You had the B12 shot today You can hold on further monthly B12 shots for now; we can re-check your B12 level in 6 months at your next visit We will call for your MRI result from earlier this year You will be contacted regarding the referral for: Orthopedic for your left knee, and Neurosurgury for your neck/arm pain Continue all other medications as before , as I dont think the type of pain you have in the AM with your knee and legs are due to the cholesterol medication I suspect we may need to increase your potassium and cholesterol medication, but we will need your lab results first Please go to LAB in the Basement for the blood and/or urine tests to be done today You will be contacted by phone if any changes need to be made immediately.  Otherwise, you will receive a letter about your results with an explanation. Please return in 6 mo with Lab testing done 3-5 days before

## 2012-04-18 ENCOUNTER — Encounter: Payer: Self-pay | Admitting: Internal Medicine

## 2012-04-18 NOTE — Assessment & Plan Note (Signed)
stable overall by hx and exam, most recent data reviewed with pt, and pt to continue medical treatment as before Lab Results  Component Value Date   HGBA1C 6.3 04/17/2012

## 2012-04-18 NOTE — Progress Notes (Signed)
Subjective:    Patient ID: Travis Carey, male    DOB: [birth year], 71 y.o.   MRN: [id number]  HPI  Here to f/u; overall doing ok,  Pt denies chest pain, increased sob or doe, wheezing, orthopnea, PND, increased LE swelling, palpitations, dizziness or syncope.  Pt denies new neurological symptoms such as new headache, or facial or extremity weakness or numbness   Pt denies polydipsia, polyuria, or low sugar symptoms such as weakness or confusion improved with po intake.  Pt states overall good compliance with meds, trying to follow lower cholesterol, diabetic diet, wt overall stable but little exercise however.  Does have worsening persistetn mild to mod bilat left > right neck pain with numbness and pain, but no UE weakness, and no bowel or bladder change, fever, wt loss,  worsening LE pain/numbness/weakness, gait change or falls.  Also has worsening 3-4 months left pain, intermittent sweling, tendency to giveaway but no falls, fever, trauma; nothing makes better or worse.   Pt denies fever, wt loss, night sweats, loss of appetite, or other constitutional symptoms Denies worsening depressive symptoms, suicidal ideation, or panic. Past Medical History  Diagnosis Date  . VITAMIN B12 DEFICIENCY 02/09/2010  . GLUCOSE INTOLERANCE 01/28/2010  . HYPERLIPIDEMIA 02/07/2008  . HYPOKALEMIA 01/28/2010  . ANEMIA-NOS 01/28/2010  . ERECTILE DYSFUNCTION 02/24/2009  . DEPRESSION 06/12/2007  . Carpal tunnel syndrome 05/20/2008  . HYPERTENSION 06/09/2007  . CAD 02/17/2010  . HEMORRHOIDS 06/09/2007  . URI 08/19/2008  . GERD 06/09/2007  . SHOULDER PAIN, LEFT 04/25/2008  . LOW BACK PAIN 06/12/2007  . PARESTHESIA 04/25/2008  . CAROTID BRUIT 11/18/2009  . DYSPNEA 07/28/2010  . CHEST PAIN 09/26/2009  . MYOCARDIAL PERFUSION SCAN, WITH STRESS TEST, ABNORMAL 10/08/2009  . Impaired glucose tolerance 01/23/2011  . Cervical radiculitis 04/17/2012   No past surgical history on file.  reports that he has never smoked. He does not have any  smokeless tobacco history on file. He reports that he does not drink alcohol or use illicit drugs. family history includes Alcohol abuse in his brother and sister; Coronary artery disease in his other; Heart attack in his other; Heart disease in his father and mother; Hyperlipidemia in his father and mother; Hypertension in his father, mother, and other; and Stroke in his other. Allergies  Allergen Reactions  . Diclofenac Sodium     REACTION: maks pt drowsey   Current Outpatient Prescriptions on File Prior to Visit  Medication Sig Dispense Refill  . amLODipine (NORVASC) 10 MG tablet Take 1 tablet (10 mg total) by mouth daily.  90 tablet  1  . aspirin 81 MG tablet Take 1 tablet (81 mg total) by mouth daily.  90 tablet  4  . carvedilol (COREG) 12.5 MG tablet Take 1 tablet (12.5 mg total) by mouth 2 (two) times daily.  180 tablet  3  . clopidogrel (PLAVIX) 75 MG tablet Take 1 tablet (75 mg total) by mouth daily.  90 tablet  3  . CRESTOR 5 MG tablet TAKE ONE TABLET BY MOUTH EVERY DAY  30 each  6  . cyanocobalamin (,VITAMIN B-12,) 1000 MCG/ML injection Inject 1,000 mcg into the muscle. 1 cc IM every month       . lisinopril-hydrochlorothiazide (PRINZIDE,ZESTORETIC) 10-12.5 MG per tablet Take 1 tablet by mouth 2 (two) times daily.      . potassium chloride (KLOR-CON 10) 10 MEQ CR tablet Take 1 tablet (10 mEq total) by mouth daily.  90 tablet  3  . tiotropium (SPIRIVA  HANDIHALER) 18 MCG inhalation capsule Place 1 capsule (18 mcg total) into inhaler and inhale daily.  30 capsule  12  . dexlansoprazole (DEXILANT) 60 MG capsule Take 1 capsule (60 mg total) by mouth daily.  30 capsule  3   Review of Systems Review of Systems  Constitutional: Negative for diaphoresis and unexpected weight change.  HENT: Negative for tinnitus.   Eyes: Negative for photophobia and visual disturbance.  Respiratory: Negative for choking and stridor.   Gastrointestinal: Negative for vomiting and blood in stool.    Genitourinary: Negative for hematuria and decreased urine volume.  Musculoskeletal: Negative for gait problem. except for the above Skin: Negative for color change and wound.  Neurological: Negative for tremors Psychiatric/Behavioral: Negative for decreased concentration    Objective:   Physical Exam BP 132/70  Pulse 64  Temp 97.2 F (36.2 C) (Oral)  Ht 5\' 8"  (1.727 m)  Wt 202 lb 8 oz (91.853 kg)  BMI 30.79 kg/m2  SpO2 96% Physical Exam  VS noted Constitutional: Pt appears well-developed and well-nourished.  HENT: Head: Normocephalic.  Right Ear: External ear normal.  Left Ear: External ear normal.  Eyes: Conjunctivae and EOM are normal. Pupils are equal, round, and reactive to light.  Neck: Normal range of motion. Neck supple.  Cardiovascular: Normal rate and regular rhythm.   Pulmonary/Chest: Effort normal and breath sounds normal.  Left knee with bony degen change, with large crepitus, NT, no effusion, and mild decreased ROM Neurological: Pt is alert. No cranial nerve deficit. motor/dtr/sens to UE intact Skin: Skin is warm. No erythema. No rash Psychiatric: Pt behavior is normal. Thought content normal.     Assessment & Plan:

## 2012-04-18 NOTE — Assessment & Plan Note (Signed)
stable overall by hx and exam, most recent data reviewed with pt, and pt to continue medical treatment as before BP Readings from Last 3 Encounters:  04/17/12 132/70  01/05/12 160/82  12/08/11 152/72

## 2012-04-18 NOTE — Assessment & Plan Note (Signed)
Mild to mod, for ortho referral, declines pain med,  to f/u any worsening symptoms or concerns

## 2012-04-18 NOTE — Assessment & Plan Note (Signed)
Mild to mod, for neurosurg referral, cont current pain control,  to f/u any worsening symptoms or concerns

## 2012-07-22 ENCOUNTER — Other Ambulatory Visit: Payer: Self-pay | Admitting: Internal Medicine

## 2012-08-24 ENCOUNTER — Ambulatory Visit (INDEPENDENT_AMBULATORY_CARE_PROVIDER_SITE_OTHER): Payer: No Typology Code available for payment source | Admitting: Cardiovascular Disease

## 2012-08-24 ENCOUNTER — Encounter: Payer: Self-pay | Admitting: Cardiovascular Disease

## 2012-08-24 VITALS — BP 140/80 | HR 69 | Ht 68.0 in | Wt 206.5 lb

## 2012-08-24 DIAGNOSIS — R000 Tachycardia, unspecified: Secondary | ICD-10-CM

## 2012-08-24 DIAGNOSIS — R209 Unspecified disturbances of skin sensation: Secondary | ICD-10-CM

## 2012-08-24 DIAGNOSIS — I10 Essential (primary) hypertension: Secondary | ICD-10-CM

## 2012-08-24 DIAGNOSIS — E785 Hyperlipidemia, unspecified: Secondary | ICD-10-CM

## 2012-08-24 DIAGNOSIS — I2510 Atherosclerotic heart disease of native coronary artery without angina pectoris: Secondary | ICD-10-CM

## 2012-08-24 DIAGNOSIS — R202 Paresthesia of skin: Secondary | ICD-10-CM

## 2012-08-24 MED ORDER — ROSUVASTATIN CALCIUM 10 MG PO TABS: 10.0000 mg | ORAL_TABLET | Freq: Every day | ORAL | Status: DC

## 2012-08-24 NOTE — Patient Instructions (Addendum)
You are doing well Increase the crestor to 10 mg daily  The phone number for [facility] is   Try naproxen/aleve  for wrist pain  Please call us if you have new issues that need to be addressed before your next appt.  Your physician wants you to follow-up in: 12 months.  You will receive a reminder letter in the mail two months in advance. If you don't receive a letter, please call our office to schedule the follow-up appointment.

## 2012-08-24 NOTE — Assessment & Plan Note (Addendum)
Significant wrist pain right worse than the left. Uncertain if this is carpal tunnel syndrome. We have given him the phone number for [facility]. perhaps they could help with his symptoms. This was his biggest concern last year. Also biggest concern one year later.

## 2012-08-24 NOTE — Progress Notes (Signed)
Patient ID: Travis Carey, male    DOB: [birth year], 71 y.o.   MRN: [id number]  HPI Comments: Travis Carey is a 71 year old gentleman with coronary artery disease, stent to the RCA, hyperlipidemia who presents for routine followup.  H/o unstable angina with stenting of the RCA on 10/31/2009.    He denies any shortness of breath with exertion or activity. Otherwise his energy is good. No significant chest pain.   He does have significant discomfort in his wrists  often at nighttime. It is positional. 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 No prior workup for carpal tunnel.   EKG shows normal sinus rhythm with rate 69 beats per minute with right bundle branch block, no significant ST or T wave changes   Outpatient Encounter Prescriptions as of 08/24/2012  Medication Sig Dispense Refill  . amLODipine (NORVASC) 5 MG tablet Take 5 mg by mouth daily.      Marland Kitchen aspirin 81 MG tablet Take 1 tablet (81 mg total) by mouth daily.  90 tablet  4  . carvedilol (COREG) 12.5 MG tablet Take 1 tablet (12.5 mg total) by mouth 2 (two) times daily.  180 tablet  3  . clopidogrel (PLAVIX) 75 MG tablet Take 1 tablet (75 mg total) by mouth daily.  90 tablet  3  . dexlansoprazole (DEXILANT) 60 MG capsule Take 60 mg by mouth as needed.      Marland Kitchen lisinopril-hydrochlorothiazide (PRINZIDE,ZESTORETIC) 10-12.5 MG per tablet Take 1 tablet by mouth 2 (two) times daily.      . potassium chloride (K-DUR) 10 MEQ tablet TAKE ONE TABLET BY MOUTH EVERY DAY  90 tablet  2  . rosuvastatin (CRESTOR) 10 MG tablet Take 1 tablet (10 mg total) by mouth daily.  30 tablet  11  . tiotropium (SPIRIVA) 18 MCG inhalation capsule Place 18 mcg into inhaler and inhale as needed.        Review of Systems  Constitutional: Negative.   HENT: Negative.   Eyes: Negative.   Cardiovascular: Negative.   Gastrointestinal: Negative.   Musculoskeletal: Positive for arthralgias.       Wrist pain  Skin: Negative.   Neurological:  Negative.   Hematological: Negative.   Psychiatric/Behavioral: Negative.   All other systems reviewed and are negative.    BP 140/80  Pulse 69  Ht 5\' 8"  (1.727 m)  Wt 206 lb 8 oz (93.668 kg)  BMI 31.40 kg/m2  Physical Exam  Nursing note and vitals reviewed. Constitutional: He is oriented to person, place, and time. He appears well-developed and well-nourished.  HENT:  Head: Normocephalic.  Nose: Nose normal.  Mouth/Throat: Oropharynx is clear and moist.  Eyes: Conjunctivae normal are normal. Pupils are equal, round, and reactive to light.  Neck: Normal range of motion. Neck supple. No JVD present.  Cardiovascular: Normal rate, regular rhythm, S1 normal, S2 normal, normal heart sounds and intact distal pulses.  Exam reveals no gallop and no friction rub.   No murmur heard. Pulmonary/Chest: Effort normal and breath sounds normal. No respiratory distress. He has no wheezes. He has no rales. He exhibits no tenderness.  Abdominal: Soft. Bowel sounds are normal. He exhibits no distension. There is no tenderness.  Musculoskeletal: Normal range of motion. He exhibits no edema and no tenderness.  Lymphadenopathy:    He has no cervical adenopathy.  Neurological: He is alert and oriented to person, place, and time. Coordination normal.  Skin: Skin is warm and dry. No rash  noted. No erythema.  Psychiatric: He has a normal mood and affect. His behavior is normal. Judgment and thought content normal.           Assessment and Plan        6

## 2012-08-24 NOTE — Assessment & Plan Note (Signed)
We recommended that he increase his Crestor back to 10 mg daily. Goal total cholesterol less than 150.

## 2012-08-24 NOTE — Assessment & Plan Note (Signed)
Blood pressure is adequate on today's visit. No changes made to the medications. He reports blood pressure is even better at home.

## 2012-08-24 NOTE — Assessment & Plan Note (Signed)
Currently with no symptoms of angina. No further workup at this time. Continue current medication regimen. 

## 2012-08-26 ENCOUNTER — Other Ambulatory Visit: Payer: Self-pay | Admitting: Internal Medicine

## 2012-10-24 ENCOUNTER — Encounter: Payer: Self-pay | Admitting: Internal Medicine

## 2012-10-24 ENCOUNTER — Other Ambulatory Visit (INDEPENDENT_AMBULATORY_CARE_PROVIDER_SITE_OTHER): Payer: Medicare Other

## 2012-10-24 ENCOUNTER — Ambulatory Visit (INDEPENDENT_AMBULATORY_CARE_PROVIDER_SITE_OTHER): Payer: Medicare Other | Admitting: Internal Medicine

## 2012-10-24 VITALS — BP 140/62 | HR 67 | Temp 98.1°F | Ht 68.0 in | Wt 206.5 lb

## 2012-10-24 DIAGNOSIS — Z13220 Encounter for screening for lipoid disorders: Secondary | ICD-10-CM

## 2012-10-24 DIAGNOSIS — Z0000 Encounter for general adult medical examination without abnormal findings: Secondary | ICD-10-CM

## 2012-10-24 DIAGNOSIS — R7309 Other abnormal glucose: Secondary | ICD-10-CM

## 2012-10-24 DIAGNOSIS — E785 Hyperlipidemia, unspecified: Secondary | ICD-10-CM

## 2012-10-24 DIAGNOSIS — E538 Deficiency of other specified B group vitamins: Secondary | ICD-10-CM

## 2012-10-24 DIAGNOSIS — R7302 Impaired glucose tolerance (oral): Secondary | ICD-10-CM

## 2012-10-24 DIAGNOSIS — K219 Gastro-esophageal reflux disease without esophagitis: Secondary | ICD-10-CM

## 2012-10-24 LAB — URINALYSIS, ROUTINE W REFLEX MICROSCOPIC
Bilirubin Urine: NEGATIVE
Ketones, ur: NEGATIVE
Specific Gravity, Urine: >=1.03 (ref 1.000–1.030)
Urine Glucose: NEGATIVE
Urobilinogen, UA: 1 (ref 0.0–1.0)

## 2012-10-24 LAB — HEPATIC FUNCTION PANEL
AST: 24 U/L (ref 0–37)
Alkaline Phosphatase: 80 U/L (ref 39–117)
Bilirubin, Direct: 0.1 mg/dL (ref 0.0–0.3)
Total Bilirubin: 0.6 mg/dL (ref 0.3–1.2)

## 2012-10-24 LAB — BASIC METABOLIC PANEL
Calcium: 9.5 mg/dL (ref 8.4–10.5)
GFR: 72.59 mL/min (ref >60.00)
Potassium: 3.4 mEq/L — ABNORMAL LOW (ref 3.5–5.1)
Sodium: 142 mEq/L (ref 135–145)

## 2012-10-24 LAB — LIPID PANEL
HDL: 30.5 mg/dL — ABNORMAL LOW (ref >39.00)
Total CHOL/HDL Ratio: 5
Triglycerides: 319 mg/dL — ABNORMAL HIGH (ref 0.0–149.0)

## 2012-10-24 LAB — CBC WITH DIFFERENTIAL/PLATELET
Basophils Absolute: 0 10*3/uL (ref 0.0–0.1)
Eosinophils Absolute: 0.1 10*3/uL (ref 0.0–0.7)
HCT: 37.7 % — ABNORMAL LOW (ref 39.0–52.0)
Hemoglobin: 13 g/dL (ref 13.0–17.0)
Lymphs Abs: 2.5 10*3/uL (ref 0.7–4.0)
MCHC: 34.5 g/dL (ref 30.0–36.0)
MCV: 88.3 fl (ref 78.0–100.0)
Monocytes Absolute: 0.6 10*3/uL (ref 0.1–1.0)
Monocytes Relative: 10.5 % (ref 3.0–12.0)
Neutro Abs: 2.4 10*3/uL (ref 1.4–7.7)
RDW: 14.2 % (ref 11.5–14.6)

## 2012-10-24 LAB — HEMOGLOBIN A1C: Hgb A1c MFr Bld: 6.4 % (ref 4.6–6.5)

## 2012-10-24 LAB — VITAMIN B12: Vitamin B-12: 318 pg/mL (ref 211–911)

## 2012-10-24 MED ORDER — PANTOPRAZOLE SODIUM 40 MG PO TBEC: 40.0000 mg | DELAYED_RELEASE_TABLET | Freq: Every day | ORAL | Status: DC

## 2012-10-24 NOTE — Patient Instructions (Addendum)
Please take all new medication as prescribed - the generic protonix 40 mg per day Please continue all other medications as before, and refills have been done if requested. Please have the pharmacy call with any other refills you may need Please go to the LAB in the Basement (turn left off the elevator) for the tests to be done today You will be contacted by phone if any changes need to be made immediately.  Otherwise, you will receive a letter about your results with an explanation Please remember to sign up for My Chart if you have not done so, as this will be important to you in the future with finding out test results, communicating by private email, and scheduling acute appointments online when needed. You will be contacted regarding the referral for: colonoscopy Please keep your appointments with your specialists as you have planned - cardiology Please return in 6 months, or sooner if needed

## 2012-10-24 NOTE — Progress Notes (Signed)
Subjective:    Patient ID: Travis Carey, male    DOB: August 03, 1941, 72 y.o.   MRN: [id number]  HPI  Here for wellness and f/u;  Overall doing ok;  Pt denies CP, worsening SOB, DOE, wheezing, orthopnea, PND, worsening LE edema, palpitations, dizziness or syncope.  Pt denies neurological change such as new headache, facial or extremity weakness.  Pt denies polydipsia, polyuria, or low sugar symptoms. Pt states overall good compliance with treatment and medications, good tolerability, and has been trying to follow lower cholesterol diet.  Pt denies worsening depressive symptoms, suicidal ideation or panic. No fever, night sweats, wt loss, loss of appetite, or other constitutional symptoms.  Pt states good ability with ADL's, has low fall risk, home safety reviewed and adequate, no other significant changes in hearing or vision, and only occasionally active with exercise.  Has seen NS for neck pain, rec'd surgury but not planed yet/pt deferring for now. On increased statin to 10 mg now per cardiology. Has had worsening mild reflux, but no abd pain, dysphagia, n/v, bowel change or blood. Past Medical History  Diagnosis Date  . VITAMIN B12 DEFICIENCY 02/09/2010  . GLUCOSE INTOLERANCE 01/28/2010  . HYPERLIPIDEMIA 02/07/2008  . HYPOKALEMIA 01/28/2010  . ANEMIA-NOS 01/28/2010  . ERECTILE DYSFUNCTION 02/24/2009  . DEPRESSION 06/12/2007  . Carpal tunnel syndrome 05/20/2008  . HYPERTENSION 06/09/2007  . CAD 02/17/2010  . HEMORRHOIDS 06/09/2007  . URI 08/19/2008  . GERD 06/09/2007  . SHOULDER PAIN, LEFT 04/25/2008  . LOW BACK PAIN 06/12/2007  . PARESTHESIA 04/25/2008  . CAROTID BRUIT 11/18/2009  . DYSPNEA 07/28/2010  . CHEST PAIN 09/26/2009  . MYOCARDIAL PERFUSION SCAN, WITH STRESS TEST, ABNORMAL 10/08/2009  . Impaired glucose tolerance 01/23/2011  . Cervical radiculitis 04/17/2012   No past surgical history on file.  reports that he has never smoked. He does not have any smokeless tobacco history on file. He reports that  he does not drink alcohol or use illicit drugs. family history includes Alcohol abuse in his brother and sister; Coronary artery disease in his other; Heart attack in his other; Heart disease in his father and mother; Hyperlipidemia in his father and mother; Hypertension in his father, mother, and other; and Stroke in his other. Allergies  Allergen Reactions  . Diclofenac Sodium     REACTION: maks pt drowsey   Current Outpatient Prescriptions on File Prior to Visit  Medication Sig Dispense Refill  . amLODipine (NORVASC) 5 MG tablet Take 5 mg by mouth daily.      Marland Kitchen aspirin 81 MG tablet Take 1 tablet (81 mg total) by mouth daily.  90 tablet  4  . carvedilol (COREG) 12.5 MG tablet TAKE ONE TABLET BY MOUTH TWICE DAILY  180 tablet  1  . clopidogrel (PLAVIX) 75 MG tablet TAKE ONE TABLET BY MOUTH EVERY DAY  90 tablet  1  . lisinopril-hydrochlorothiazide (PRINZIDE,ZESTORETIC) 10-12.5 MG per tablet Take 1 tablet by mouth 2 (two) times daily.      . potassium chloride (K-DUR) 10 MEQ tablet TAKE ONE TABLET BY MOUTH EVERY DAY  90 tablet  2  . rosuvastatin (CRESTOR) 10 MG tablet Take 1 tablet (10 mg total) by mouth daily.  30 tablet  11  . dexlansoprazole (DEXILANT) 60 MG capsule Take 60 mg by mouth as needed.      . pantoprazole (PROTONIX) 40 MG tablet Take 1 tablet (40 mg total) by mouth daily.  90 tablet  3  . tiotropium (SPIRIVA) 18 MCG inhalation capsule Place  18 mcg into inhaler and inhale as needed.        Review of Systems Constitutional: Negative for diaphoresis, activity change, appetite change or unexpected weight change.  HENT: Negative for hearing loss, ear pain, facial swelling, mouth sores and neck stiffness.   Eyes: Negative for pain, redness and visual disturbance.  Respiratory: Negative for shortness of breath and wheezing.   Cardiovascular: Negative for chest pain and palpitations.  Gastrointestinal: Negative for diarrhea, blood in stool, abdominal distention or other  pain Genitourinary: Negative for hematuria, flank pain or change in urine volume.  Musculoskeletal: Negative for myalgias and joint swelling.  Skin: Negative for color change and wound.  Neurological: Negative for syncope and numbness. other than noted Hematological: Negative for adenopathy.  Psychiatric/Behavioral: Negative for hallucinations, self-injury, decreased concentration and agitation.      Objective:   Physical Exam BP 140/62  Pulse 67  Temp 98.1 F (36.7 C) (Oral)  Ht 5\' 8"  (1.727 m)  Wt 206 lb 8 oz (93.668 kg)  BMI 31.40 kg/m2  SpO2 95% VS noted,  Constitutional: Pt is oriented to person, place, and time. Appears well-developed and well-nourished.  Head: Normocephalic and atraumatic.  Right Ear: External ear normal.  Left Ear: External ear normal.  Nose: Nose normal.  Mouth/Throat: Oropharynx is clear and moist.  Eyes: Conjunctivae and EOM are normal. Pupils are equal, round, and reactive to light.  Neck: Normal range of motion. Neck supple. No JVD present. No tracheal deviation present.  Cardiovascular: Normal rate, regular rhythm, normal heart sounds and intact distal pulses.   Pulmonary/Chest: Effort normal and breath sounds normal.  Abdominal: Soft. Bowel sounds are normal. There is no tenderness. No HSM  Musculoskeletal: Normal range of motion. Exhibits no edema.  Lymphadenopathy:  Has no cervical adenopathy.  Neurological: Pt is alert and oriented to person, place, and time. Pt has normal reflexes. No cranial nerve deficit.  Skin: Skin is warm and dry. No rash noted.  Psychiatric:  Has  normal mood and affect. Behavior is normal.     Assessment & Plan:

## 2012-10-25 ENCOUNTER — Other Ambulatory Visit: Payer: Self-pay | Admitting: Internal Medicine

## 2012-10-25 ENCOUNTER — Encounter: Payer: Self-pay | Admitting: Internal Medicine

## 2012-10-25 MED ORDER — CEPHALEXIN 500 MG PO CAPS: 500.0000 mg | ORAL_CAPSULE | Freq: Four times a day (QID) | ORAL | Status: DC

## 2012-10-25 NOTE — Assessment & Plan Note (Signed)
For protonix asd 

## 2012-10-25 NOTE — Assessment & Plan Note (Signed)

## 2012-10-31 ENCOUNTER — Encounter: Payer: Self-pay | Admitting: Internal Medicine

## 2012-11-28 ENCOUNTER — Telehealth: Payer: Self-pay

## 2012-11-28 ENCOUNTER — Ambulatory Visit (INDEPENDENT_AMBULATORY_CARE_PROVIDER_SITE_OTHER): Payer: Medicare Other | Admitting: Internal Medicine

## 2012-11-28 ENCOUNTER — Encounter: Payer: Self-pay | Admitting: Internal Medicine

## 2012-11-28 VITALS — BP 154/80 | HR 68 | Ht 67.72 in | Wt 209.4 lb

## 2012-11-28 DIAGNOSIS — Z9861 Coronary angioplasty status: Secondary | ICD-10-CM

## 2012-11-28 DIAGNOSIS — I2510 Atherosclerotic heart disease of native coronary artery without angina pectoris: Secondary | ICD-10-CM

## 2012-11-28 DIAGNOSIS — Z1211 Encounter for screening for malignant neoplasm of colon: Secondary | ICD-10-CM

## 2012-11-28 DIAGNOSIS — Z01818 Encounter for other preprocedural examination: Secondary | ICD-10-CM

## 2012-11-28 MED ORDER — NA SULFATE-K SULFATE-MG SULF 17.5-3.13-1.6 GM/177ML PO SOLN: ORAL | Status: DC

## 2012-11-28 NOTE — Telephone Encounter (Signed)
[facility] [address] [place] [address]    RE: Travis Carey DOB: March 24, 1941 MRN: [id number]   Dear Dr. Julien Nordmann,    We have scheduled the above patient for an endoscopic procedure. Our records show that he is on anticoagulation therapy.   Please advise as to how long the patient may come off his therapy of Plavix prior to the colonoscopy procedure, which is scheduled for April 9th 2014 at 11:00am.  Please fax back/ or route the completed form to Ranelle Auker [place], CMA at [phone].   Sincerely,  [place], Clayborne Dana   Please respond by April 1st 2014 and route back to Jeymi Hepp [place], CMA (AAMA)

## 2012-11-28 NOTE — Progress Notes (Signed)
  Subjective:    Patient ID: Travis Carey, male    DOB: 02/23/1941, 72 y.o.   MRN: [id number]  HPI The patient presents for evaluation prior to screening colonoscopy. He has coronary artery disease and RCA stents and is on clopidogrel and aspirin. He is not having any active angina though he does get some dyspnea on exertion. He last saw his cardiologist in November and was stable. He has never had a screening colonoscopy. He has no active GI symptoms.  Medications, allergies, past medical history, past surgical history, family history and social history are reviewed and updated in the EMR.   Review of Systems As above. He also has mild pedal edema, fatigue, back pain.    Objective:   Physical Exam General:  NAD Eyes:   anicteric Lungs:  clear Heart:  S1S2 no rubs, murmurs or gallops Abdomen:  soft and nontender, BS+ Ext:   no edema  Data Reviewed:  November 2013 cardiology note    Assessment & Plan:   1. Special screening for malignant neoplasms, colon   2. Preoperative examination, unspecified   3. CAD S/P percutaneous coronary intervention (stent) on clopidogrel    1. We'll schedule screening colonoscopy. He would like to do this in April. I've explained the need to hold clopidogrel and the increased risk of potential cardiac complication. He will remain on aspirin. The risks and benefits as well as alternatives of endoscopic procedure(s) have been discussed and reviewed. All questions answered. The patient agrees to proceed.

## 2012-11-28 NOTE — Patient Instructions (Addendum)
You have been scheduled for a colonoscopy with propofol. Please follow written instructions given to you at your visit today. Please use the suprep kit given to you today. If you use inhalers (even only as needed) or a CPAP machine, please bring them with you on the day of your procedure.  You will be contaced by our office prior to your procedure for directions on holding your Plavix.  If you do not hear from our office 1 week prior to your scheduled procedure, please call [phone] to discuss.  You may stay on your Asprin for the colonoscopy.   Thank you for choosing me and [facility].  Iva Boop, M.D., [facility]

## 2012-11-29 NOTE — Telephone Encounter (Signed)
Okay to hold Plavix for 5 days prior to procedure 

## 2012-11-30 NOTE — Telephone Encounter (Signed)
Spoke to patient and informed him to hold Plavix for 5 days prior to his colonoscopy 01/17/13, he verbalized understanding.

## 2012-11-30 NOTE — Telephone Encounter (Signed)
See below

## 2012-12-05 ENCOUNTER — Other Ambulatory Visit: Payer: Self-pay | Admitting: Internal Medicine

## 2013-01-12 ENCOUNTER — Other Ambulatory Visit: Payer: Self-pay | Admitting: Cardiovascular Disease

## 2013-01-15 ENCOUNTER — Other Ambulatory Visit: Payer: Self-pay | Admitting: Internal Medicine

## 2013-01-15 ENCOUNTER — Other Ambulatory Visit: Payer: Self-pay | Admitting: *Deleted

## 2013-01-15 MED ORDER — AMLODIPINE BESYLATE 5 MG PO TABS: 5.0000 mg | ORAL_TABLET | Freq: Every day | ORAL | Status: DC

## 2013-01-17 ENCOUNTER — Encounter: Payer: Medicare Other | Admitting: Internal Medicine

## 2013-01-31 ENCOUNTER — Ambulatory Visit (INDEPENDENT_AMBULATORY_CARE_PROVIDER_SITE_OTHER)
Admission: RE | Admit: 2013-01-31 | Discharge: 2013-01-31 | Disposition: A | Discharge status: Home / Self Care | Payer: Medicare Other | Source: Ambulatory Visit | Attending: Internal Medicine | Admitting: Internal Medicine

## 2013-01-31 ENCOUNTER — Ambulatory Visit (INDEPENDENT_AMBULATORY_CARE_PROVIDER_SITE_OTHER): Payer: Medicare Other | Admitting: Internal Medicine

## 2013-01-31 ENCOUNTER — Encounter: Payer: Self-pay | Admitting: Internal Medicine

## 2013-01-31 VITALS — BP 118/70 | HR 64 | Temp 97.6°F | Ht 68.0 in | Wt 208.4 lb

## 2013-01-31 DIAGNOSIS — R059 Cough, unspecified: Secondary | ICD-10-CM | POA: Insufficient documentation

## 2013-01-31 DIAGNOSIS — H6980 Other specified disorders of Eustachian tube, unspecified ear: Secondary | ICD-10-CM | POA: Insufficient documentation

## 2013-01-31 DIAGNOSIS — Z23 Encounter for immunization: Secondary | ICD-10-CM

## 2013-01-31 DIAGNOSIS — R079 Chest pain, unspecified: Secondary | ICD-10-CM

## 2013-01-31 DIAGNOSIS — R05 Cough: Secondary | ICD-10-CM | POA: Insufficient documentation

## 2013-01-31 DIAGNOSIS — H6982 Other specified disorders of Eustachian tube, left ear: Secondary | ICD-10-CM

## 2013-01-31 DIAGNOSIS — J309 Allergic rhinitis, unspecified: Secondary | ICD-10-CM

## 2013-01-31 DIAGNOSIS — K219 Gastro-esophageal reflux disease without esophagitis: Secondary | ICD-10-CM

## 2013-01-31 HISTORY — DX: Allergic rhinitis, unspecified: J30.9

## 2013-01-31 MED ORDER — FLUTICASONE PROPIONATE 50 MCG/ACT NA SUSP: 2.0000 | Freq: Every day | NASAL | Status: DC

## 2013-01-31 MED ORDER — DEXLANSOPRAZOLE 60 MG PO CPDR: 60.0000 mg | DELAYED_RELEASE_CAPSULE | Freq: Every day | ORAL | Status: DC

## 2013-01-31 MED ORDER — FEXOFENADINE HCL 180 MG PO TABS: 180.0000 mg | ORAL_TABLET | Freq: Every day | ORAL | Status: DC

## 2013-01-31 MED ORDER — METHYLPREDNISOLONE ACETATE 80 MG/ML IJ SUSP
80.0000 mg | Freq: Once | INTRAMUSCULAR | Status: AC
  Administered 2013-01-31: 80 mg via INTRAMUSCULAR

## 2013-01-31 NOTE — Patient Instructions (Addendum)
You had the steroid shot today Your EKG was OK today OK to stop the pantoprazole (generic protonix) Please take all new medication as prescribed - the dexilant 60 mg per day Please take all new medication as prescribed - the generic allegra and flonase for allergies You can also take Mucinex (or it's generic off brand) for congestion and ear fullness, and tylenol as needed for pain. Please go to the XRAY Department in the Basement (go straight as you get off the elevator) for the x-ray testing You will be contacted by phone if any changes need to be made immediately.  Otherwise, you will receive a letter about your results with an explanation Please remember to sign up for My Chart if you have not done so, as this will be important to you in the future with finding out test results, communicating by private email, and scheduling acute appointments online when needed.

## 2013-01-31 NOTE — Progress Notes (Signed)
Subjective:    Patient ID: Travis Carey, male    DOB: 03/26/1941, 72 y.o.   MRN: [id number]  HPI  Here to f/u, Does have 1-2 wks ongoing nasal allergy symptoms with clearish congestion, itch and sneezing, without fever, pain, ST, swelling or wheezing, but has post nasal gtt markedly, hard to sleep at night due to coughing.  Also with multiple types of CP - one is more typical reflux with SSCP with radiation to neck, burning, mild, intermittent, worse to lie down, not as well controlled on current PPI, nonexertional, nonpleuritic.  Has other CP intermittent mild to right lateral chest since has been coughing more at night, without radiation or assoc symptoms or diaphoresis, n/v sob, palps, dizziness (except for some off balance he thinks related to left ear fullness and popping/crackling since onset of allergy symtpoms last wk).  Pt denies  increased sob or doe except to first get up in the AM and walk to end of driveway for the paper but none the rest of the day, and no wheezing, orthopnea, PND, increased LE swelling, syncope.  Pt denies fever, wt loss, night sweats, loss of appetite, or other constitutional symptoms.  Never smoked Past Medical History  Diagnosis Date  . VITAMIN B12 DEFICIENCY 02/09/2010  . GLUCOSE INTOLERANCE 01/28/2010  . HYPERLIPIDEMIA 02/07/2008  . HYPOKALEMIA 01/28/2010  . ANEMIA-NOS 01/28/2010  . ERECTILE DYSFUNCTION 02/24/2009  . DEPRESSION 06/12/2007  . Carpal tunnel syndrome 05/20/2008  . HYPERTENSION 06/09/2007  . CAD 02/17/2010  . HEMORRHOIDS 06/09/2007  . URI 08/19/2008  . GERD 06/09/2007  . SHOULDER PAIN, LEFT 04/25/2008  . LOW BACK PAIN 06/12/2007  . PARESTHESIA 04/25/2008  . CAROTID BRUIT 11/18/2009  . DYSPNEA 07/28/2010  . CHEST PAIN 09/26/2009  . MYOCARDIAL PERFUSION SCAN, WITH STRESS TEST, ABNORMAL 10/08/2009  . Impaired glucose tolerance 01/23/2011  . Cervical radiculitis 04/17/2012   Past Surgical History  Procedure Laterality Date  . Coronary stent placement      from  right radial    reports that he has never smoked. He has never used smokeless tobacco. He reports that he does not drink alcohol or use illicit drugs. family history includes Alcohol abuse in his brother and sister; Coronary artery disease in his other; Heart attack in his other; Heart disease in his father and mother; Hyperlipidemia in his father and mother; Hypertension in his father, mother, and other; and Stroke in his other. Allergies  Allergen Reactions  . Diclofenac Sodium     REACTION: maks pt drowsey   Current Outpatient Prescriptions on File Prior to Visit  Medication Sig Dispense Refill  . amLODipine (NORVASC) 5 MG tablet Take 5 mg by mouth daily.      Marland Kitchen amLODipine (NORVASC) 5 MG tablet Take 1 tablet (5 mg total) by mouth daily.  30 tablet  3  . aspirin 81 MG tablet Take 1 tablet (81 mg total) by mouth daily.  90 tablet  4  . carvedilol (COREG) 12.5 MG tablet TAKE ONE TABLET BY MOUTH TWICE DAILY  180 tablet  1  . clopidogrel (PLAVIX) 75 MG tablet TAKE ONE TABLET BY MOUTH EVERY DAY  90 tablet  1  . lisinopril-hydrochlorothiazide (PRINZIDE,ZESTORETIC) 10-12.5 MG per tablet Take 1 tablet by mouth 2 (two) times daily.      Marland Kitchen lisinopril-hydrochlorothiazide (PRINZIDE,ZESTORETIC) 10-12.5 MG per tablet TAKE TWO TABLETS BY MOUTH EVERY DAY  60 tablet  7  . Na Sulfate-K Sulfate-Mg Sulf (SUPREP BOWEL PREP) SOLN Use as directed  2 Bottle  0  . pantoprazole (PROTONIX) 40 MG tablet Take 1 tablet (40 mg total) by mouth daily.  90 tablet  3  . potassium chloride (K-DUR) 10 MEQ tablet TAKE ONE TABLET BY MOUTH EVERY DAY  90 tablet  2  . rosuvastatin (CRESTOR) 10 MG tablet Take 1 tablet (10 mg total) by mouth daily.  30 tablet  11  . tiotropium (SPIRIVA) 18 MCG inhalation capsule Place 18 mcg into inhaler and inhale as needed.       No current facility-administered medications on file prior to visit.   Review of Systems  Constitutional: Negative for unexpected weight change, or unusual diaphoresis   HENT: Negative for tinnitus.   Eyes: Negative for photophobia and visual disturbance.  Respiratory: Negative for choking and stridor.   Gastrointestinal: Negative for vomiting and blood in stool.  Genitourinary: Negative for hematuria and decreased urine volume.  Musculoskeletal: Negative for acute joint swelling Skin: Negative for color change and wound.  Neurological: Negative for tremors and numbness other than noted  Psychiatric/Behavioral: Negative for decreased concentration or  hyperactivity.       Objective:   Physical Exam BP 118/70  Pulse 64  Temp(Src) 97.6 F (36.4 C) (Oral)  Ht 5\' 8"  (1.727 m)  Wt 208 lb 6 oz (94.518 kg)  BMI 31.69 kg/m2  SpO2 95% VS noted,  Constitutional: Pt appears well-developed and well-nourished.  HENT: Head: NCAT.  Right Ear: External ear normal.  Left Ear: External ear normal.  Bilat tm's with mild erythema.  Max sinus areas minimal tender.  Pharynx with mild erythema, no exudate, no neck LA Eyes: Conjunctivae and EOM are normal. Pupils are equal, round, and reactive to light.  Neck: Normal range of motion. Neck supple.  Cardiovascular: Normal rate and regular rhythm.   Pulmonary/Chest: Effort normal and breath sounds normal.  Abd:  Soft, NT, non-distended, + BS Neurological: Pt is alert. Not confused , motor/sens/dtr intact, tandem not tested Skin: Skin is warm. No erythema. Chest wall not tender to palpation today Psychiatric: Pt behavior is normal. Thought content normal.     Assessment & Plan:

## 2013-01-31 NOTE — Assessment & Plan Note (Signed)
Ok for change to [employer] as this worked bette for him in the past

## 2013-01-31 NOTE — Addendum Note (Signed)
Addended by: Scharlene Gloss B on: 01/31/2013 11:17 AM   Modules accepted: Orders

## 2013-01-31 NOTE — Assessment & Plan Note (Signed)
Ok for mucinex otc prn 

## 2013-01-31 NOTE — Assessment & Plan Note (Signed)
Possible right lateral MSK pain related to cough vs other, as well as reflux, for cxr today, ECG reviewed as per emr

## 2013-01-31 NOTE — Assessment & Plan Note (Signed)
Suspect related to post nasal gtt, for cxr today as above

## 2013-01-31 NOTE — Assessment & Plan Note (Signed)
With seasonal flare, for depomedrol IM, and generic allegra/flonase asd,  to f/u any worsening symptoms or concerns

## 2013-02-16 ENCOUNTER — Encounter: Payer: Medicare Other | Admitting: Internal Medicine

## 2013-03-01 ENCOUNTER — Other Ambulatory Visit: Payer: Self-pay | Admitting: Internal Medicine

## 2013-04-25 ENCOUNTER — Ambulatory Visit (INDEPENDENT_AMBULATORY_CARE_PROVIDER_SITE_OTHER): Payer: Medicare Other

## 2013-04-25 ENCOUNTER — Ambulatory Visit (INDEPENDENT_AMBULATORY_CARE_PROVIDER_SITE_OTHER): Payer: Medicare Other | Admitting: Internal Medicine

## 2013-04-25 ENCOUNTER — Encounter: Payer: Self-pay | Admitting: Internal Medicine

## 2013-04-25 VITALS — BP 142/80 | HR 69 | Temp 98.4°F | Wt 205.2 lb

## 2013-04-25 DIAGNOSIS — N400 Enlarged prostate without lower urinary tract symptoms: Secondary | ICD-10-CM

## 2013-04-25 DIAGNOSIS — J309 Allergic rhinitis, unspecified: Secondary | ICD-10-CM

## 2013-04-25 DIAGNOSIS — M25562 Pain in left knee: Secondary | ICD-10-CM

## 2013-04-25 DIAGNOSIS — H6983 Other specified disorders of Eustachian tube, bilateral: Secondary | ICD-10-CM

## 2013-04-25 DIAGNOSIS — M25561 Pain in right knee: Secondary | ICD-10-CM | POA: Insufficient documentation

## 2013-04-25 DIAGNOSIS — H6980 Other specified disorders of Eustachian tube, unspecified ear: Secondary | ICD-10-CM

## 2013-04-25 DIAGNOSIS — R7989 Other specified abnormal findings of blood chemistry: Secondary | ICD-10-CM

## 2013-04-25 DIAGNOSIS — M25569 Pain in unspecified knee: Secondary | ICD-10-CM

## 2013-04-25 DIAGNOSIS — E785 Hyperlipidemia, unspecified: Secondary | ICD-10-CM

## 2013-04-25 HISTORY — DX: Benign prostatic hyperplasia without lower urinary tract symptoms: N40.0

## 2013-04-25 LAB — BASIC METABOLIC PANEL
BUN: 10 mg/dL (ref 6–23)
Chloride: 104 mEq/L (ref 96–112)
GFR: 102.91 mL/min (ref >60.00)
Glucose, Bld: 92 mg/dL (ref 70–99)
Potassium: 3.4 mEq/L — ABNORMAL LOW (ref 3.5–5.1)
Sodium: 141 mEq/L (ref 135–145)

## 2013-04-25 LAB — HEPATIC FUNCTION PANEL
ALT: 21 U/L (ref 0–53)
AST: 21 U/L (ref 0–37)
Alkaline Phosphatase: 71 U/L (ref 39–117)
Bilirubin, Direct: 0.1 mg/dL (ref 0.0–0.3)
Total Bilirubin: 0.7 mg/dL (ref 0.3–1.2)

## 2013-04-25 LAB — LIPID PANEL
Total CHOL/HDL Ratio: 5
Triglycerides: 262 mg/dL — ABNORMAL HIGH (ref 0.0–149.0)

## 2013-04-25 MED ORDER — FEXOFENADINE HCL 180 MG PO TABS: 180.0000 mg | ORAL_TABLET | Freq: Every day | ORAL | Status: DC

## 2013-04-25 MED ORDER — MELOXICAM 7.5 MG PO TABS: ORAL_TABLET | ORAL | Status: DC

## 2013-04-25 MED ORDER — FLUTICASONE PROPIONATE 50 MCG/ACT NA SUSP: 2.0000 | Freq: Every day | NASAL | Status: DC

## 2013-04-25 MED ORDER — TAMSULOSIN HCL 0.4 MG PO CAPS: 0.4000 mg | ORAL_CAPSULE | Freq: Every day | ORAL | Status: DC

## 2013-04-25 NOTE — Progress Notes (Signed)
Subjective:    Patient ID: Travis Carey, male    DOB: [birth year], 72 y.o.   MRN: [id number]  HPI  Here to f/u, has increased urinary slower flow and post void dribbling, also recent UTI jan 2014.  Denies urinary symptoms such as dysuria, frequency, urgency, flank pain, hematuria or n/v, fever, chills.  Does have several wks ongoing nasal allergy symptoms with clearish congestion, itch and sneezing, without fever, pain, ST, cough, swelling or wheezing, also has some right ear fullness and popping.  Also c/o bilat knee arthritic pains intermittent mild worse to longer walking, asks for nsaid prn.  Also due for colonoscopy  - had to cancel previous due to lack of family support to help.   Pt denies fever, wt loss, night sweats, loss of appetite, or other constitutional symptoms Past Medical History  Diagnosis Date  . VITAMIN B12 DEFICIENCY 02/09/2010  . GLUCOSE INTOLERANCE 01/28/2010  . HYPERLIPIDEMIA 02/07/2008  . HYPOKALEMIA 01/28/2010  . ANEMIA-NOS 01/28/2010  . ERECTILE DYSFUNCTION 02/24/2009  . DEPRESSION 06/12/2007  . Carpal tunnel syndrome 05/20/2008  . HYPERTENSION 06/09/2007  . CAD 02/17/2010  . HEMORRHOIDS 06/09/2007  . URI 08/19/2008  . GERD 06/09/2007  . SHOULDER PAIN, LEFT 04/25/2008  . LOW BACK PAIN 06/12/2007  . PARESTHESIA 04/25/2008  . CAROTID BRUIT 11/18/2009  . DYSPNEA 07/28/2010  . CHEST PAIN 09/26/2009  . MYOCARDIAL PERFUSION SCAN, WITH STRESS TEST, ABNORMAL 10/08/2009  . Impaired glucose tolerance 01/23/2011  . Cervical radiculitis 04/17/2012  . Allergic rhinitis, cause unspecified 01/31/2013  . BPH (benign prostatic hypertrophy) 04/25/2013   Past Surgical History  Procedure Laterality Date  . Coronary stent placement      from right radial    reports that he has never smoked. He has never used smokeless tobacco. He reports that he does not drink alcohol or use illicit drugs. family history includes Alcohol abuse in his brother and sister; Coronary artery disease in his other; Heart  attack in his other; Heart disease in his father and mother; Hyperlipidemia in his father and mother; Hypertension in his father, mother, and other; and Stroke in his other. Allergies  Allergen Reactions  . Diclofenac Sodium     REACTION: maks pt drowsey   Current Outpatient Prescriptions on File Prior to Visit  Medication Sig Dispense Refill  . amLODipine (NORVASC) 5 MG tablet Take 1 tablet (5 mg total) by mouth daily.  30 tablet  3  . aspirin 81 MG tablet Take 1 tablet (81 mg total) by mouth daily.  90 tablet  4  . carvedilol (COREG) 12.5 MG tablet TAKE ONE TABLET BY MOUTH TWICE DAILY  180 tablet  3  . clopidogrel (PLAVIX) 75 MG tablet TAKE ONE TABLET BY MOUTH EVERY DAY  90 tablet  3  . dexlansoprazole (DEXILANT) 60 MG capsule Take 1 capsule (60 mg total) by mouth daily.  90 capsule  3  . lisinopril-hydrochlorothiazide (PRINZIDE,ZESTORETIC) 10-12.5 MG per tablet Take 1 tablet by mouth 2 (two) times daily.      . Na Sulfate-K Sulfate-Mg Sulf (SUPREP BOWEL PREP) SOLN Use as directed  2 Bottle  0  . potassium chloride (K-DUR) 10 MEQ tablet TAKE ONE TABLET BY MOUTH EVERY DAY  90 tablet  2  . rosuvastatin (CRESTOR) 10 MG tablet Take 1 tablet (10 mg total) by mouth daily.  30 tablet  11  . tiotropium (SPIRIVA) 18 MCG inhalation capsule Place 18 mcg into inhaler and inhale as needed.       No  current facility-administered medications on file prior to visit.   Review of Systems  Constitutional: Negative for unexpected weight change, or unusual diaphoresis  HENT: Negative for tinnitus.   Eyes: Negative for photophobia and visual disturbance.  Respiratory: Negative for choking and stridor.   Gastrointestinal: Negative for vomiting and blood in stool.  Genitourinary: Negative for hematuria and decreased urine volume.  Musculoskeletal: Negative for acute joint swelling Skin: Negative for color change and wound.  Neurological: Negative for tremors and numbness other than noted   Psychiatric/Behavioral: Negative for decreased concentration or  hyperactivity.       Objective:   Physical Exam BP 142/80  Pulse 69  Temp(Src) 98.4 F (36.9 C) (Oral)  Wt 205 lb 3.2 oz (93.078 kg)  BMI 31.21 kg/m2  SpO2 95% VS noted,  Constitutional: Pt appears well-developed and well-nourished.  HENT: Head: NCAT.  Right Ear: External ear normal.  Left Ear: External ear normal.  Bilat tm's with mild erythema.  Max sinus areas non tender.  Pharynx with mild erythema, no exudate Eyes: Conjunctivae and EOM are normal. Pupils are equal, round, and reactive to light.  Neck: Normal range of motion. Neck supple.  Cardiovascular: Normal rate and regular rhythm.   Pulmonary/Chest: Effort normal and breath sounds normal.  Abd:  Soft, NT, non-distended, + BS, no flank tender Bilat knees with mild crepitus, NT, no effusion, FROM Neurological: Pt is alert. Not confused  Skin: Skin is warm. No erythema.  Psychiatric: Pt behavior is normal. Thought content normal.     Assessment & Plan:

## 2013-04-25 NOTE — Assessment & Plan Note (Signed)
For mucinex otc prn 

## 2013-04-25 NOTE — Assessment & Plan Note (Signed)
Symptomatically worsening, for flomax trial, consider urology

## 2013-04-25 NOTE — Patient Instructions (Addendum)
Please take all new medication as prescribed - the flomax (generic) for the prostate, flonase for the allergies, meloxicam for the leg pains Please continue all other medications as before, and refills have been done if requested. Please have the pharmacy call with any other refills you may need. Your right ear was irrigated of wax today Please call to re-schedule your colonoscopy Please go to the LAB in the Basement (turn left off the elevator) for the tests to be done today You will be contacted by phone if any changes need to be made immediately.  Otherwise, you will receive a letter about your results with an explanation, but please check with MyChart first.  Please remember to sign up for My Chart if you have not done so, as this will be important to you in the future with finding out test results, communicating by private email, and scheduling acute appointments online when needed.  Please return in 6 months, or sooner if needed, with Lab testing done 3-5 days before

## 2013-04-25 NOTE — Assessment & Plan Note (Signed)
Ok for allegra/flonase asd,  to f/u any worsening symptoms or concerns  

## 2013-04-25 NOTE — Assessment & Plan Note (Signed)
Ok for mobic prn,  to f/u any worsening symptoms or concerns 

## 2013-04-26 ENCOUNTER — Other Ambulatory Visit: Payer: Self-pay | Admitting: Internal Medicine

## 2013-04-26 ENCOUNTER — Encounter: Payer: Self-pay | Admitting: Internal Medicine

## 2013-04-26 LAB — LDL CHOLESTEROL, DIRECT: Direct LDL: 76 mg/dL

## 2013-04-26 MED ORDER — POTASSIUM CHLORIDE ER 10 MEQ PO TBCR: EXTENDED_RELEASE_TABLET | ORAL | Status: DC

## 2013-06-04 ENCOUNTER — Other Ambulatory Visit: Payer: Self-pay | Admitting: Adult Health

## 2013-06-04 ENCOUNTER — Telehealth: Payer: Self-pay

## 2013-06-04 DIAGNOSIS — Z0000 Encounter for general adult medical examination without abnormal findings: Secondary | ICD-10-CM

## 2013-06-04 NOTE — Telephone Encounter (Signed)
I dont know how to do this, but I will forward to our PCC's to see if can help

## 2013-06-04 NOTE — Telephone Encounter (Signed)
The patients wife called to request to schedule a colonoscopy for the patient at [facility]. The first of September.  Call back number [phone]

## 2013-06-05 NOTE — Telephone Encounter (Signed)
Patient informed and will call back with the name of GI in [place].

## 2013-06-05 NOTE — Telephone Encounter (Signed)
Robin to let pt know, we dont normally arrange colonoscopy appointments for the procedure itself;    Does he mean he would need a referral to a GI who works at [employer]?

## 2013-06-05 NOTE — Telephone Encounter (Signed)
There is no referral for this pt

## 2013-06-07 NOTE — Telephone Encounter (Signed)
Referral done per emr 

## 2013-06-07 NOTE — Telephone Encounter (Signed)
The patients wife is requesting his referral to be with Dr. Archie Endo at [facility] for his colonoscopy

## 2013-06-07 NOTE — Addendum Note (Signed)
Addended by: Corwin Levins on: 06/07/2013 12:37 PM   Modules accepted: Orders

## 2013-06-19 ENCOUNTER — Other Ambulatory Visit: Payer: Self-pay | Admitting: Adult Health

## 2013-06-19 ENCOUNTER — Other Ambulatory Visit: Payer: Self-pay

## 2013-06-19 MED ORDER — AMLODIPINE BESYLATE 5 MG PO TABS: 5.0000 mg | ORAL_TABLET | Freq: Every day | ORAL | Status: DC

## 2013-06-27 ENCOUNTER — Telehealth: Payer: Self-pay

## 2013-06-27 NOTE — Telephone Encounter (Signed)
Faxed Clearance for Colonoscopy (PCP signed off ok for Procedure) to [facility] at [phone]. Put form in scan basket for patients chart.

## 2013-07-12 ENCOUNTER — Telehealth: Payer: Self-pay | Admitting: *Deleted

## 2013-07-12 NOTE — Telephone Encounter (Signed)
Yes, this is true, and I agree.  Pt should be of plavix from today until the colonoscopy.  He should re-start the day after the colonoscopy providing he is not advised otherwise per Gastroenterology

## 2013-07-12 NOTE — Telephone Encounter (Signed)
Pt called states he is to have a Colonoscopy on Tuesday, 10.7.14.  Was advised by surgeon to d/c Plavix 5 days prior to procedure.

## 2013-07-12 NOTE — Telephone Encounter (Signed)
Spoke with pt, advised of MDs message 

## 2013-07-17 ENCOUNTER — Ambulatory Visit: Payer: Self-pay | Admitting: Gastroenterology

## 2013-07-18 LAB — PATHOLOGY REPORT

## 2013-08-02 ENCOUNTER — Telehealth: Payer: Self-pay

## 2013-08-02 NOTE — Telephone Encounter (Signed)
Informed the patient of MD instructions. 

## 2013-08-02 NOTE — Telephone Encounter (Signed)
The patient has a colonoscopy 3 weeks ago and still has not heard his results.

## 2013-08-02 NOTE — Telephone Encounter (Signed)
Please refer pt to approp person on the third floor/GI

## 2013-08-28 ENCOUNTER — Other Ambulatory Visit: Payer: Self-pay | Admitting: *Deleted

## 2013-08-28 ENCOUNTER — Other Ambulatory Visit: Payer: Self-pay | Admitting: Cardiovascular Disease

## 2013-08-28 MED ORDER — ROSUVASTATIN CALCIUM 10 MG PO TABS: 10.0000 mg | ORAL_TABLET | Freq: Every day | ORAL | Status: DC

## 2013-08-28 NOTE — Telephone Encounter (Signed)
Requested Prescriptions   Signed Prescriptions Disp Refills  . rosuvastatin (CRESTOR) 10 MG tablet 30 tablet 1    Sig: Take 1 tablet (10 mg total) by mouth daily.    Authorizing Provider: Antonieta Iba    Ordering User: Kendrick Fries

## 2013-09-12 ENCOUNTER — Encounter: Payer: Self-pay | Admitting: Cardiovascular Disease

## 2013-09-12 ENCOUNTER — Ambulatory Visit (INDEPENDENT_AMBULATORY_CARE_PROVIDER_SITE_OTHER): Payer: Medicare Other | Admitting: Cardiovascular Disease

## 2013-09-12 VITALS — BP 160/80 | HR 59 | Ht 68.0 in | Wt 206.8 lb

## 2013-09-12 DIAGNOSIS — I10 Essential (primary) hypertension: Secondary | ICD-10-CM

## 2013-09-12 DIAGNOSIS — I2510 Atherosclerotic heart disease of native coronary artery without angina pectoris: Secondary | ICD-10-CM

## 2013-09-12 DIAGNOSIS — E785 Hyperlipidemia, unspecified: Secondary | ICD-10-CM

## 2013-09-12 DIAGNOSIS — R079 Chest pain, unspecified: Secondary | ICD-10-CM

## 2013-09-12 DIAGNOSIS — E669 Obesity, unspecified: Secondary | ICD-10-CM

## 2013-09-12 MED ORDER — AMLODIPINE BESYLATE 10 MG PO TABS: 10.0000 mg | ORAL_TABLET | Freq: Every day | ORAL | Status: DC

## 2013-09-12 NOTE — Progress Notes (Signed)
Patient ID: Travis Carey, male    DOB: 1940-12-13, 72 y.o.   MRN: [id number]  HPI Comments: Travis Carey is a 72 year old gentleman with coronary artery disease, stent to the RCA, hyperlipidemia who presents for routine followup.  H/o unstable angina with stenting of the RCA on 10/31/2009.   In followup today, he denies any significant chest pain. He does have shortness of breath which she reports is a chronic issue. He does not do any regular exercise. He works as a Curator. Blood pressure has been running high at home, typically 147 systolic.  He does have significant discomfort in his wrists  often at nighttime. It is positional. 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    EKG shows normal sinus rhythm with rate 59 beats per minute with right bundle branch block, no significant ST or T wave changes, left anterior fascicular block  Outpatient Encounter Prescriptions as of 09/12/2013  Medication Sig  . amLODipine (NORVASC) 5 MG tablet Take 1 tablet (5 mg total) by mouth daily.  Marland Kitchen aspirin 81 MG tablet Take 1 tablet (81 mg total) by mouth daily.  . carvedilol (COREG) 12.5 MG tablet TAKE ONE TABLET BY MOUTH TWICE DAILY  . clopidogrel (PLAVIX) 75 MG tablet TAKE ONE TABLET BY MOUTH EVERY DAY  . dexlansoprazole (DEXILANT) 60 MG capsule Take 1 capsule (60 mg total) by mouth daily.  . fexofenadine (ALLEGRA) 180 MG tablet Take 1 tablet (180 mg total) by mouth daily. As needed  . fluticasone (FLONASE) 50 MCG/ACT nasal spray Place 2 sprays into the nose daily.  Marland Kitchen lisinopril-hydrochlorothiazide (PRINZIDE,ZESTORETIC) 10-12.5 MG per tablet Take 1 tablet by mouth 2 (two) times daily.  . meloxicam (MOBIC) 7.5 MG tablet 1 -2 tabs by mouth per day as needed for pain  . Na Sulfate-K Sulfate-Mg Sulf (SUPREP BOWEL PREP) SOLN Use as directed  . potassium chloride (K-DUR) 10 MEQ tablet TAKE 2 TABLET BY MOUTH EVERY DAY  . rosuvastatin (CRESTOR) 10 MG tablet Take 1 tablet (10 mg total) by  mouth daily.  . tamsulosin (FLOMAX) 0.4 MG CAPS Take 1 capsule (0.4 mg total) by mouth daily.  Marland Kitchen tiotropium (SPIRIVA) 18 MCG inhalation capsule Place 18 mcg into inhaler and inhale as needed.  . [DISCONTINUED] CRESTOR 10 MG tablet TAKE ONE TABLET BY MOUTH EVERY DAY     Review of Systems  Constitutional: Negative.   HENT: Negative.   Eyes: Negative.   Respiratory: Negative.   Cardiovascular: Negative.   Gastrointestinal: Negative.   Endocrine: Negative.   Musculoskeletal: Positive for arthralgias.       Wrist pain  Skin: Negative.   Allergic/Immunologic: Negative.   Neurological: Negative.   Hematological: Negative.   Psychiatric/Behavioral: Negative.   All other systems reviewed and are negative.    BP 160/80  Pulse 59  Ht 5\' 8"  (1.727 m)  Wt 206 lb 12 oz (93.781 kg)  BMI 31.44 kg/m2  Physical Exam  Nursing note and vitals reviewed. Constitutional: He is oriented to person, place, and time. He appears well-developed and well-nourished.  HENT:  Head: Normocephalic.  Nose: Nose normal.  Mouth/Throat: Oropharynx is clear and moist.  Eyes: Conjunctivae are normal. Pupils are equal, round, and reactive to light.  Neck: Normal range of motion. Neck supple. No JVD present.  Cardiovascular: Normal rate, regular rhythm, S1 normal, S2 normal, normal heart sounds and intact distal pulses.  Exam reveals no gallop and no friction rub.   No murmur heard.  Pulmonary/Chest: Effort normal and breath sounds normal. No respiratory distress. He has no wheezes. He has no rales. He exhibits no tenderness.  Abdominal: Soft. Bowel sounds are normal. He exhibits no distension. There is no tenderness.  Musculoskeletal: Normal range of motion. He exhibits no edema and no tenderness.  Lymphadenopathy:    He has no cervical adenopathy.  Neurological: He is alert and oriented to person, place, and time. Coordination normal.  Skin: Skin is warm and dry. No rash noted. No erythema.  Psychiatric: He  has a normal mood and affect. His behavior is normal. Judgment and thought content normal.      Assessment and Plan

## 2013-09-12 NOTE — Assessment & Plan Note (Signed)
We have suggested he increase his amlodipine to 10 mg daily

## 2013-09-12 NOTE — Patient Instructions (Addendum)
You are doing well. Take pepcid or zantac (generic) 2 pills twice a day as needed for heartburn, acid  Please increase the amlodipine up to 10 mg a day  Please call us if you have new issues that need to be addressed before your next appt.  Your physician wants you to follow-up in: 6 months.  You will receive a reminder letter in the mail two months in advance. If you don't receive a letter, please call our office to schedule the follow-up appointment.

## 2013-09-12 NOTE — Assessment & Plan Note (Signed)
We have encouraged continued exercise, careful diet management in an effort to lose weight. 

## 2013-09-12 NOTE — Assessment & Plan Note (Signed)
Currently with no symptoms of angina. No further workup at this time. Continue current medication regimen. 

## 2013-09-12 NOTE — Assessment & Plan Note (Signed)
Cholesterol is at goal on the current lipid regimen. No changes to the medications were made.  

## 2013-11-01 ENCOUNTER — Ambulatory Visit (INDEPENDENT_AMBULATORY_CARE_PROVIDER_SITE_OTHER): Payer: Medicare HMO | Admitting: Internal Medicine

## 2013-11-01 ENCOUNTER — Encounter: Payer: Self-pay | Admitting: Internal Medicine

## 2013-11-01 ENCOUNTER — Ambulatory Visit (INDEPENDENT_AMBULATORY_CARE_PROVIDER_SITE_OTHER)
Admission: RE | Admit: 2013-11-01 | Discharge: 2013-11-01 | Disposition: A | Discharge status: Home / Self Care | Payer: Medicare HMO | Source: Ambulatory Visit | Attending: Internal Medicine | Admitting: Internal Medicine

## 2013-11-01 ENCOUNTER — Ambulatory Visit (INDEPENDENT_AMBULATORY_CARE_PROVIDER_SITE_OTHER): Payer: Medicare HMO

## 2013-11-01 VITALS — BP 142/70 | HR 61 | Temp 99.1°F | Ht 68.0 in | Wt 204.0 lb

## 2013-11-01 DIAGNOSIS — M25539 Pain in unspecified wrist: Secondary | ICD-10-CM

## 2013-11-01 DIAGNOSIS — M25532 Pain in left wrist: Secondary | ICD-10-CM

## 2013-11-01 DIAGNOSIS — I509 Heart failure, unspecified: Secondary | ICD-10-CM

## 2013-11-01 DIAGNOSIS — R7302 Impaired glucose tolerance (oral): Secondary | ICD-10-CM

## 2013-11-01 DIAGNOSIS — Z23 Encounter for immunization: Secondary | ICD-10-CM

## 2013-11-01 DIAGNOSIS — M25531 Pain in right wrist: Secondary | ICD-10-CM

## 2013-11-01 DIAGNOSIS — R7309 Other abnormal glucose: Secondary | ICD-10-CM

## 2013-11-01 DIAGNOSIS — Z0000 Encounter for general adult medical examination without abnormal findings: Secondary | ICD-10-CM

## 2013-11-01 DIAGNOSIS — Z0001 Encounter for general adult medical examination with abnormal findings: Secondary | ICD-10-CM | POA: Insufficient documentation

## 2013-11-01 DIAGNOSIS — R351 Nocturia: Secondary | ICD-10-CM

## 2013-11-01 LAB — CBC WITH DIFFERENTIAL/PLATELET
Basophils Absolute: 0 10*3/uL (ref 0.0–0.1)
Basophils Relative: 0.3 % (ref 0.0–3.0)
EOS ABS: 0.1 10*3/uL (ref 0.0–0.7)
Eosinophils Relative: 2.5 % (ref 0.0–5.0)
HEMATOCRIT: 38 % — AB (ref 39.0–52.0)
HEMOGLOBIN: 13 g/dL (ref 13.0–17.0)
LYMPHS ABS: 2.6 10*3/uL (ref 0.7–4.0)
Lymphocytes Relative: 44.4 % (ref 12.0–46.0)
MCHC: 34.3 g/dL (ref 30.0–36.0)
MCV: 87.8 fl (ref 78.0–100.0)
Monocytes Absolute: 0.5 10*3/uL (ref 0.1–1.0)
Monocytes Relative: 7.7 % (ref 3.0–12.0)
NEUTROS ABS: 2.6 10*3/uL (ref 1.4–7.7)
Neutrophils Relative %: 45.1 % (ref 43.0–77.0)
Platelets: 221 10*3/uL (ref 150.0–400.0)
RBC: 4.33 Mil/uL (ref 4.22–5.81)
RDW: 14.1 % (ref 11.5–14.6)
WBC: 5.9 10*3/uL (ref 4.5–10.5)

## 2013-11-01 LAB — URINALYSIS, ROUTINE W REFLEX MICROSCOPIC
Bilirubin Urine: NEGATIVE
HGB URINE DIPSTICK: NEGATIVE
KETONES UR: NEGATIVE
Leukocytes, UA: NEGATIVE
Nitrite: NEGATIVE
Specific Gravity, Urine: 1.025 (ref 1.000–1.030)
Total Protein, Urine: NEGATIVE
URINE GLUCOSE: NEGATIVE
Urobilinogen, UA: 0.2 (ref 0.0–1.0)
pH: 6 (ref 5.0–8.0)

## 2013-11-01 LAB — PSA: PSA: 0.71 ng/mL (ref 0.10–4.00)

## 2013-11-01 LAB — TSH: TSH: 1.64 u[IU]/mL (ref 0.35–5.50)

## 2013-11-01 LAB — HEMOGLOBIN A1C: Hgb A1c MFr Bld: 6.3 % (ref 4.6–6.5)

## 2013-11-01 MED ORDER — COLCHICINE 0.6 MG PO TABS: 0.6000 mg | ORAL_TABLET | Freq: Every day | ORAL | Status: DC

## 2013-11-01 MED ORDER — METHYLPREDNISOLONE ACETATE 80 MG/ML IJ SUSP
80.0000 mg | Freq: Once | INTRAMUSCULAR | Status: AC
  Administered 2013-11-01: 80 mg via INTRAMUSCULAR

## 2013-11-01 MED ORDER — PREDNISONE 10 MG PO TABS: ORAL_TABLET | ORAL | Status: DC

## 2013-11-01 NOTE — Assessment & Plan Note (Signed)

## 2013-11-01 NOTE — Patient Instructions (Addendum)
You had the steroid shot today You had the new prevnar pneumonia shot today Please take all new medication as prescribed  - the prednisone (for short term), and the colchicine (for long term) for probable gout Please continue all other medications as before, and refills have been done if requested. Please have the pharmacy call with any other refills you may need. Please continue your efforts at being more active, low cholesterol diet, and weight control. You are otherwise up to date with prevention measures today. Please keep your appointments with your specialists as you may have planned  Please go to the XRAY Department in the Basement (go straight as you get off the elevator) for the x-ray testing - wrist xrays to see about gout vs pseudogout  Please go to the LAB in the Basement (turn left off the elevator) for the tests to be done today You will be contacted by phone if any changes need to be made immediately.  Otherwise, you will receive a letter about your results with an explanation, but please check with MyChart first.  Please remember to sign up for My Chart if you have not done so, as this will be important to you in the future with finding out test results, communicating by private email, and scheduling acute appointments online when needed.  You will be contacted regarding the referral for: cardiology for may 2015 with Dr Rockey Situ, as well as the new Urology referral  Please return in 6 months, or sooner if needed

## 2013-11-01 NOTE — Assessment & Plan Note (Signed)
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   

## 2013-11-01 NOTE — Progress Notes (Signed)
Subjective:    Patient ID: Travis Carey, male    DOB: [birth year], 73 y.o.   MRN: [id number]  HPI  Here for wellness and f/u;  Overall doing ok;  Pt denies CP, worsening SOB, DOE, wheezing, orthopnea, PND, worsening LE edema, palpitations, dizziness or syncope.  Pt denies neurological change such as new headache, facial or extremity weakness.  Pt denies polydipsia, polyuria, or low sugar symptoms. Pt states overall good compliance with treatment and medications, good tolerability, and has been trying to follow lower cholesterol diet.  Pt denies worsening depressive symptoms, suicidal ideation or panic. No fever, night sweats, wt loss, loss of appetite, or other constitutional symptoms.  Pt states good ability with ADL's, has low fall risk, home safety reviewed and adequate, no other significant changes in hearing or vision, and only occasionally active with exercise.  Needs repeat referral to card due to insurance. Has nocturia worsening now 3 times nightly despite taking the flomax.  Due for psa, does not see urology. Also most pressing to him is 1 wk onset mod to severe pain to left > right wrists, worse at night, hard to keep under covers b/c makes it worse, no truama, no fever, no hx of gout or similar.  Wonders if this is part of his recurring carpal tunnel. Due for prevnar Past Medical History  Diagnosis Date  . VITAMIN B12 DEFICIENCY 02/09/2010  . GLUCOSE INTOLERANCE 01/28/2010  . HYPERLIPIDEMIA 02/07/2008  . HYPOKALEMIA 01/28/2010  . ANEMIA-NOS 01/28/2010  . ERECTILE DYSFUNCTION 02/24/2009  . DEPRESSION 06/12/2007  . Carpal tunnel syndrome 05/20/2008  . HYPERTENSION 06/09/2007  . CAD 02/17/2010  . HEMORRHOIDS 06/09/2007  . URI 08/19/2008  . GERD 06/09/2007  . SHOULDER PAIN, LEFT 04/25/2008  . LOW BACK PAIN 06/12/2007  . PARESTHESIA 04/25/2008  . CAROTID BRUIT 11/18/2009  . DYSPNEA 07/28/2010  . CHEST PAIN 09/26/2009  . MYOCARDIAL PERFUSION SCAN, WITH STRESS TEST, ABNORMAL 10/08/2009  . Impaired  glucose tolerance 01/23/2011  . Cervical radiculitis 04/17/2012  . Allergic rhinitis, cause unspecified 01/31/2013  . BPH (benign prostatic hypertrophy) 04/25/2013  . GERD (gastroesophageal reflux disease)    Past Surgical History  Procedure Laterality Date  . Coronary stent placement      from right radial  . Colonoscopy      reports that he has never smoked. He has never used smokeless tobacco. He reports that he does not drink alcohol or use illicit drugs. family history includes Alcohol abuse in his brother and sister; Coronary artery disease in his other; Heart attack in his other; Heart disease in his father and mother; Hyperlipidemia in his father and mother; Hypertension in his father, mother, and other; Stroke in his other. Allergies  Allergen Reactions  . Diclofenac Sodium     REACTION: maks pt drowsey   Current Outpatient Prescriptions on File Prior to Visit  Medication Sig Dispense Refill  . amLODipine (NORVASC) 10 MG tablet Take 1 tablet (10 mg total) by mouth daily.  30 tablet  11  . aspirin 81 MG tablet Take 1 tablet (81 mg total) by mouth daily.  90 tablet  4  . carvedilol (COREG) 12.5 MG tablet TAKE ONE TABLET BY MOUTH TWICE DAILY  180 tablet  3  . clopidogrel (PLAVIX) 75 MG tablet TAKE ONE TABLET BY MOUTH EVERY DAY  90 tablet  3  . dexlansoprazole (DEXILANT) 60 MG capsule Take 1 capsule (60 mg total) by mouth daily.  90 capsule  3  . fexofenadine (ALLEGRA) 180  MG tablet Take 1 tablet (180 mg total) by mouth daily. As needed  90 tablet  3  . fluticasone (FLONASE) 50 MCG/ACT nasal spray Place 2 sprays into the nose daily.  16 g  2  . lisinopril-hydrochlorothiazide (PRINZIDE,ZESTORETIC) 10-12.5 MG per tablet Take 1 tablet by mouth 2 (two) times daily.      . meloxicam (MOBIC) 7.5 MG tablet 1 -2 tabs by mouth per day as needed for pain  60 tablet  3  . Na Sulfate-K Sulfate-Mg Sulf (SUPREP BOWEL PREP) SOLN Use as directed  2 Bottle  0  . potassium chloride (K-DUR) 10 MEQ tablet  TAKE 2 TABLET BY MOUTH EVERY DAY  180 tablet  2  . rosuvastatin (CRESTOR) 10 MG tablet Take 1 tablet (10 mg total) by mouth daily.  30 tablet  1  . tamsulosin (FLOMAX) 0.4 MG CAPS Take 1 capsule (0.4 mg total) by mouth daily.  90 capsule  3  . tiotropium (SPIRIVA) 18 MCG inhalation capsule Place 18 mcg into inhaler and inhale as needed.       No current facility-administered medications on file prior to visit.    Review of Systems Constitutional: Negative for diaphoresis, activity change, appetite change or unexpected weight change.  HENT: Negative for hearing loss, ear pain, facial swelling, mouth sores and neck stiffness.   Eyes: Negative for pain, redness and visual disturbance.  Respiratory: Negative for shortness of breath and wheezing.   Cardiovascular: Negative for chest pain and palpitations.  Gastrointestinal: Negative for diarrhea, blood in stool, abdominal distention or other pain Genitourinary: Negative for hematuria, flank pain or change in urine volume.  Musculoskeletal: Negative for myalgias and joint swelling.  Skin: Negative for color change and wound.  Neurological: Negative for syncope and numbness. other than noted Hematological: Negative for adenopathy.  Psychiatric/Behavioral: Negative for hallucinations, self-injury, decreased concentration and agitation.      Objective:   Physical Exam BP 142/70  Pulse 61  Temp(Src) 99.1 F (37.3 C) (Oral)  Ht 5\' 8"  (1.727 m)  Wt 204 lb (92.534 kg)  BMI 31.03 kg/m2  SpO2 96% VS noted, not ill appaering Constitutional: Pt is oriented to person, place, and time. Appears well-developed and well-nourished.  Head: Normocephalic and atraumatic.  Right Ear: External ear normal.  Left Ear: External ear normal.  Nose: Nose normal.  Mouth/Throat: Oropharynx is clear and moist.  Eyes: Conjunctivae and EOM are normal. Pupils are equal, round, and reactive to light.  Neck: Normal range of motion. Neck supple. No JVD present. No  tracheal deviation present.  Cardiovascular: Normal rate, regular rhythm, normal heart sounds and intact distal pulses.   Pulmonary/Chest: Effort normal and breath sounds normal.  Abdominal: Soft. Bowel sounds are normal. There is no tenderness. No HSM  Musculoskeletal: Normal range of motion. Exhibits no edema.  Lymphadenopathy:  Has no cervical adenopathy.  Neurological: Pt is alert and oriented to person, place, and time. Pt has normal reflexes. No cranial nerve deficit.  Skin: Skin is warm and dry. No rash noted. no LE edema Left > right wrist with mod warmth, swelling, decreased ROM Psychiatric:  Has  normal mood and affect. Behavior is normal.     Assessment & Plan:

## 2013-11-01 NOTE — Assessment & Plan Note (Signed)
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

## 2013-11-01 NOTE — Progress Notes (Signed)
Pre-visit discussion using our clinic review tool. No additional management support is needed unless otherwise documented below in the visit note.  

## 2013-11-01 NOTE — Assessment & Plan Note (Signed)
stable overall by history and exam, recent data reviewed with pt, and pt to continue medical treatment as before,  to f/u any worsening symptoms or concerns Lab Results  Component Value Date   HGBA1C 6.4 10/24/2012

## 2013-11-01 NOTE — Assessment & Plan Note (Signed)
On flomax, will need urology referral,cont all tx

## 2013-11-02 ENCOUNTER — Encounter: Payer: Self-pay | Admitting: Internal Medicine

## 2013-11-02 LAB — BASIC METABOLIC PANEL
BUN: 15 mg/dL (ref 6–23)
CO2: 27 meq/L (ref 19–32)
Calcium: 9.4 mg/dL (ref 8.4–10.5)
Chloride: 107 mEq/L (ref 96–112)
Creatinine, Ser: 1 mg/dL (ref 0.4–1.5)
GFR: 91.33 mL/min (ref >60.00)
GLUCOSE: 86 mg/dL (ref 70–99)
POTASSIUM: 3.5 meq/L (ref 3.5–5.1)
Sodium: 142 mEq/L (ref 135–145)

## 2013-11-02 LAB — LIPID PANEL
CHOLESTEROL: 153 mg/dL (ref 0–200)
HDL: 34.5 mg/dL — ABNORMAL LOW (ref >39.00)
Total CHOL/HDL Ratio: 4
Triglycerides: 219 mg/dL — ABNORMAL HIGH (ref 0.0–149.0)
VLDL: 43.8 mg/dL — ABNORMAL HIGH (ref 0.0–40.0)

## 2013-11-02 LAB — URIC ACID: URIC ACID, SERUM: 5.5 mg/dL (ref 4.0–7.8)

## 2013-11-02 LAB — LDL CHOLESTEROL, DIRECT: Direct LDL: 89.5 mg/dL

## 2013-11-02 LAB — HEPATIC FUNCTION PANEL
ALBUMIN: 4 g/dL (ref 3.5–5.2)
ALT: 20 U/L (ref 0–53)
AST: 23 U/L (ref 0–37)
Alkaline Phosphatase: 82 U/L (ref 39–117)
Bilirubin, Direct: 0.1 mg/dL (ref 0.0–0.3)
Total Bilirubin: 0.4 mg/dL (ref 0.3–1.2)
Total Protein: 7.6 g/dL (ref 6.0–8.3)

## 2013-12-04 ENCOUNTER — Other Ambulatory Visit: Payer: Self-pay | Admitting: Cardiovascular Disease

## 2014-01-06 ENCOUNTER — Other Ambulatory Visit: Payer: Self-pay | Admitting: Cardiovascular Disease

## 2014-01-28 ENCOUNTER — Other Ambulatory Visit: Payer: Self-pay | Admitting: Internal Medicine

## 2014-01-29 NOTE — Telephone Encounter (Signed)
Refill done.  

## 2014-02-05 ENCOUNTER — Encounter: Payer: Self-pay | Admitting: Internal Medicine

## 2014-02-05 ENCOUNTER — Ambulatory Visit (INDEPENDENT_AMBULATORY_CARE_PROVIDER_SITE_OTHER): Payer: Commercial Managed Care - HMO | Admitting: Internal Medicine

## 2014-02-05 VITALS — BP 152/82 | HR 64 | Temp 97.6°F | Wt 206.0 lb

## 2014-02-05 DIAGNOSIS — J069 Acute upper respiratory infection, unspecified: Secondary | ICD-10-CM

## 2014-02-05 DIAGNOSIS — J310 Chronic rhinitis: Secondary | ICD-10-CM

## 2014-02-05 DIAGNOSIS — M5030 Other cervical disc degeneration, unspecified cervical region: Secondary | ICD-10-CM

## 2014-02-05 MED ORDER — AMOXICILLIN 500 MG PO CAPS: 500.0000 mg | ORAL_CAPSULE | Freq: Three times a day (TID) | ORAL | Status: DC

## 2014-02-05 MED ORDER — PREDNISONE 10 MG PO TABS: 10.0000 mg | ORAL_TABLET | Freq: Two times a day (BID) | ORAL | Status: DC

## 2014-02-05 NOTE — Progress Notes (Signed)
   Subjective:    Patient ID: Travis Carey, male    DOB: 13-Mar-1941, 73 y.o.   MRN: [id number]  HPI  He describes ear pain since Saturday, no ear discharge. He had some relief with Vicks vabor rub behind the ear.  He experienced fever and sweats last week, along with several occipital headaches. His nasal secretions have been green, although now he states they are clearing up.  No cough or chest sputum.   His problem list reveals he has eustachian tube dysfunction.   He also asks about a cortisone shot for bulging disc in cervical spine. He had relief from cortisone shot several months ago and is leaving for vacation soon.   Review of Systems Denies dyspnea, wheezing, otorrhea, sore throat, cough, chest sputum, recent dental work near affected area.     Objective:   Physical Exam General appearance:good health ;well nourished; no acute distress or increased work of breathing is present.  No  lymphadenopathy about the head, neck, or axilla noted.   Eyes: No conjunctival inflammation or lid edema is present. There is no scleral icterus.  Ears:  External ear exam shows no significant lesions or deformities.  Otoscopic examination reveals clear canals, dull tympanic membranes are intact bilaterally without bulging, retraction, inflammation or discharge. No hemaotalgia.   Nose:  External nasal examination shows R nasal polyp; no inflammation. Nasal mucosa are pink and moist without lesions or exudates. No septal dislocation or deviation.No obstruction to airflow.   Oral exam: upper partial; lips and gums are healthy appearing.There is no oropharyngeal erythema or exudate noted.   Neck:  No deformities, thyromegaly, masses, or tenderness noted.   Supple with full range of motion without pain.   Heart:  Normal rate and regular rhythm. S1 and S2 normal without gallop, murmur, click, rub or other extra sounds.   Lungs:Chest clear to auscultation; no wheezes, rhonchi,rales ,or rubs present.No  increased work of breathing.    Extremities:  No cyanosis, edema, or clubbing  noted   Skin: Warm & dry w/o jaundice or tenting.     Assessment & Plan:  #1 sinusitis - Amox (watchful waiting > 72 hrs) + nasal hygiene program  #2 cervical disc disease - cortisone shot today?

## 2014-02-05 NOTE — Patient Instructions (Signed)

## 2014-02-05 NOTE — Progress Notes (Signed)
Pre visit review using our clinic review tool, if applicable. No additional management support is needed unless otherwise documented below in the visit note. 

## 2014-02-05 NOTE — Progress Notes (Signed)
   Subjective:    Patient ID: Travis Carey, male    DOB: Jul 08, 1941, 73 y.o.   MRN: [id number]  HPI  He describes ear pain since Saturday, no ear discharge. He had some relief with Vicks vabor rub behind the ear.  He experienced fever and sweats last week, along with several occipital headaches. His nasal secretions have been green, although now he states they are clearing up.  No cough or chest sputum.  His problem list reveals he has eustachian tube dysfunction.  He also asks about a cortisone shot for bulging disc in cervical spine. He had relief from cortisone shot several months ago and is leaving for vacation soon.    Review of Systems Denies dyspnea, wheezing, otorrhea, sore throat, cough, chest sputum, recent dental work near affected area.      Objective:   Physical Exam General appearance:good health ;well nourished; no acute distress or increased work of breathing is present. No lymphadenopathy about the head, neck, or axilla noted.  Eyes: No conjunctival inflammation or lid edema is present. There is no scleral icterus.  Ears: External ear exam shows no significant lesions or deformities. Otoscopic examination reveals clear canals, dull tympanic membranes are intact bilaterally without bulging, retraction, inflammation or discharge. No hemotypanum Nose: External nasal examination shows R nasal polyp; no inflammation. Nasal mucosa are pink and moist without lesions or exudates. No septal dislocation or deviation.No obstruction to airflow.  Oral exam: dentures; lips and gums are healthy appearing.There is no oropharyngeal erythema or exudate noted.  Neck: No deformities, thyromegaly, masses, or tenderness noted. Supple with full range of motion without pain.  Heart: Normal rate and regular rhythm. S1 and S2 normal without gallop, murmur, click, rub or other extra sounds.  Lungs:Chest clear to auscultation; no wheezes, rhonchi,rales ,or rubs present.No increased work of breathing.    Extremities: No cyanosis, edema, or clubbing noted  Skin: Warm & dry w/o jaundice or tenting.       Assessment & Plan:  #1 rhinitis , doubt sinusitis - Amox (watchful waiting > 72 hrs) + nasal hygiene program  #2 cervical disc disease - burst oral steroid for this & rhinitis  #3 Eustachian tube dysfunction

## 2014-02-25 ENCOUNTER — Other Ambulatory Visit: Payer: Self-pay | Admitting: Internal Medicine

## 2014-03-12 ENCOUNTER — Ambulatory Visit (INDEPENDENT_AMBULATORY_CARE_PROVIDER_SITE_OTHER): Payer: Medicare HMO | Admitting: Cardiovascular Disease

## 2014-03-12 ENCOUNTER — Encounter: Payer: Self-pay | Admitting: Cardiovascular Disease

## 2014-03-12 VITALS — BP 140/78 | HR 66 | Ht 68.0 in | Wt 207.0 lb

## 2014-03-12 DIAGNOSIS — I10 Essential (primary) hypertension: Secondary | ICD-10-CM

## 2014-03-12 DIAGNOSIS — R0602 Shortness of breath: Secondary | ICD-10-CM

## 2014-03-12 DIAGNOSIS — E785 Hyperlipidemia, unspecified: Secondary | ICD-10-CM

## 2014-03-12 DIAGNOSIS — I2510 Atherosclerotic heart disease of native coronary artery without angina pectoris: Secondary | ICD-10-CM

## 2014-03-12 NOTE — Progress Notes (Signed)
Patient ID: Travis Carey, male    DOB: 29-Jun-1941, 73 y.o.   MRN: [id number]  HPI Comments: Travis Carey is a 73 year old gentleman with coronary artery disease, stent to the RCA, hyperlipidemia who presents for routine followup. H/o unstable angina with stenting of the RCA on 10/31/2009.    In followup today, he denies any significant chest pain.  shortness of breath in the morning which she reports is a chronic issue. He does not do any regular exercise. He works as a Dealer. He continues to have hand swelling, possible carpal tunnel issues.  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  Has several dogs at home   Prior lab work showing total cholesterol 153, LDL 89 which is higher from previous lab work Hemoglobin A1c 6.3  EKG shows normal sinus  rhythm with rate 66 beats per minute with right bundle branch block, no significant ST or T wave changes, left anterior fascicular block  Outpatient Encounter Prescriptions as of 03/12/2014  Medication Sig  . amLODipine (NORVASC) 10 MG tablet Take 1 tablet (10 mg total) by mouth daily.  Marland Kitchen aspirin 81 MG tablet Take 1 tablet (81 mg total) by mouth daily.  . carvedilol (COREG) 12.5 MG tablet TAKE ONE TABLET BY MOUTH TWICE DAILY  . clopidogrel (PLAVIX) 75 MG tablet TAKE ONE TABLET BY MOUTH EVERY DAY  . colchicine 0.6 MG tablet Take 1 tablet (0.6 mg total) by mouth daily.  . CRESTOR 10 MG tablet TAKE ONE TABLET BY MOUTH ONCE DAILY  . dexlansoprazole (DEXILANT) 60 MG capsule Take 1 capsule (60 mg total) by mouth daily.  . fexofenadine (ALLEGRA) 180 MG tablet Take 1 tablet (180 mg total) by mouth daily. As needed  . fluticasone (FLONASE) 50 MCG/ACT nasal spray Place 2 sprays into the nose daily.  Marland Kitchen lisinopril-hydrochlorothiazide (PRINZIDE,ZESTORETIC) 10-12.5 MG per tablet Take 1 tablet by mouth 2 (two) times daily.  . meloxicam (MOBIC) 7.5 MG tablet TAKE ONE TO TWO TABLETS BY MOUTH ONCE DAILY AS NEEDED FOR PAIN  . Na Sulfate-K  Sulfate-Mg Sulf (SUPREP BOWEL PREP) SOLN Use as directed  . potassium chloride (K-DUR) 10 MEQ tablet TAKE 2 TABLET BY MOUTH EVERY DAY  . predniSONE (DELTASONE) 10 MG tablet Take 1 tablet (10 mg total) by mouth 2 (two) times daily with a meal.  . tamsulosin (FLOMAX) 0.4 MG CAPS Take 1 capsule (0.4 mg total) by mouth daily.  Marland Kitchen tiotropium (SPIRIVA) 18 MCG inhalation capsule Place 18 mcg into inhaler and inhale as needed.    Review of Systems  Constitutional: Negative.   HENT: Negative.   Eyes: Negative.   Respiratory: Negative.   Cardiovascular: Negative.   Gastrointestinal: Negative.   Endocrine: Negative.   Musculoskeletal: Positive for arthralgias.       Wrist pain  Skin: Negative.   Allergic/Immunologic: Negative.   Neurological: Negative.   Hematological: Negative.   Psychiatric/Behavioral: Negative.   All other systems reviewed and are negative.   BP 140/78  Pulse 66  Ht 5\' 8"  (1.727 m)  Wt 207 lb (93.895 kg)  BMI 31.48 kg/m2  Physical Exam  Nursing note and vitals reviewed. Constitutional: He is oriented to person, place, and time. He appears well-developed and well-nourished.  HENT:  Head: Normocephalic.  Nose: Nose normal.  Mouth/Throat: Oropharynx is clear and moist.  Eyes: Conjunctivae are normal. Pupils are equal, round, and reactive to light.  Neck: Normal range of motion. Neck supple. No JVD present.  Cardiovascular:  Normal rate, regular rhythm, S1 normal, S2 normal, normal heart sounds and intact distal pulses.  Exam reveals no gallop and no friction rub.   No murmur heard. Pulmonary/Chest: Effort normal and breath sounds normal. No respiratory distress. He has no wheezes. He has no rales. He exhibits no tenderness.  Abdominal: Soft. Bowel sounds are normal. He exhibits no distension. There is no tenderness.  Musculoskeletal: Normal range of motion. He exhibits no edema and no tenderness.  Lymphadenopathy:    He has no cervical adenopathy.  Neurological: He  is alert and oriented to person, place, and time. Coordination normal.  Skin: Skin is warm and dry. No rash noted. No erythema.  Psychiatric: He has a normal mood and affect. His behavior is normal. Judgment and thought content normal.      Assessment and Plan

## 2014-03-12 NOTE — Assessment & Plan Note (Signed)
Currently with no symptoms of angina. No further workup at this time. Continue current medication regimen. 

## 2014-03-12 NOTE — Patient Instructions (Signed)
You are doing well. No medication changes were made.  Please call us if you have new issues that need to be addressed before your next appt.  Your physician wants you to follow-up in: 12 months.  You will receive a reminder letter in the mail two months in advance. If you don't receive a letter, please call our office to schedule the follow-up appointment. 

## 2014-03-12 NOTE — Assessment & Plan Note (Signed)
Cholesterol is above goal. Recommended walking and weight loss, continue Crestor

## 2014-03-12 NOTE — Assessment & Plan Note (Signed)
Blood pressure is well controlled on today's visit. No changes made to the medications. 

## 2014-03-24 ENCOUNTER — Other Ambulatory Visit: Payer: Self-pay | Admitting: Internal Medicine

## 2014-04-04 ENCOUNTER — Encounter: Payer: Self-pay | Admitting: Internal Medicine

## 2014-04-04 ENCOUNTER — Ambulatory Visit (INDEPENDENT_AMBULATORY_CARE_PROVIDER_SITE_OTHER): Payer: Commercial Managed Care - HMO | Admitting: Internal Medicine

## 2014-04-04 VITALS — BP 132/80 | HR 78 | Temp 98.7°F | Wt 207.4 lb

## 2014-04-04 DIAGNOSIS — M545 Low back pain, unspecified: Secondary | ICD-10-CM

## 2014-04-04 DIAGNOSIS — R7302 Impaired glucose tolerance (oral): Secondary | ICD-10-CM

## 2014-04-04 DIAGNOSIS — I10 Essential (primary) hypertension: Secondary | ICD-10-CM

## 2014-04-04 DIAGNOSIS — R7309 Other abnormal glucose: Secondary | ICD-10-CM

## 2014-04-04 MED ORDER — TIZANIDINE HCL 4 MG PO TABS: 4.0000 mg | ORAL_TABLET | Freq: Four times a day (QID) | ORAL | Status: DC | PRN

## 2014-04-04 MED ORDER — PREDNISONE 10 MG PO TABS: ORAL_TABLET | ORAL | Status: DC

## 2014-04-04 MED ORDER — KETOROLAC TROMETHAMINE 30 MG/ML IJ SOLN
30.0000 mg | Freq: Once | INTRAMUSCULAR | Status: AC
  Administered 2014-04-04: 30 mg via INTRAMUSCULAR

## 2014-04-04 MED ORDER — HYDROCODONE-ACETAMINOPHEN 5-325 MG PO TABS: 1.0000 | ORAL_TABLET | Freq: Four times a day (QID) | ORAL | Status: DC | PRN

## 2014-04-04 NOTE — Progress Notes (Signed)
Subjective:    Patient ID: Travis Carey, male    DOB: [birth year], 73 y.o.   MRN: [id number]  HPI  Here to f/u  - Pt c/o 1 wk onset mod to severe mid and bilat LBP with radiation to both legs below the knees, but no bowel or bladder change, fever, wt loss,  worsening LE numbness/weakness, gait change or falls.  Worse to sit too long or walk.  Ibuprofen helps somewhat. Started after spent some time leaning into a car to work on it.  Similar pain as previous that resolved spontaneously, this time more severe.  No prior MRI, surgury, recent films or fall. Pt denies chest pain, increased sob or doe, wheezing, orthopnea, PND, increased LE swelling, palpitations, dizziness or syncope.   Pt denies polydipsia, polyuria,   Past Medical History  Diagnosis Date  . VITAMIN B12 DEFICIENCY 02/09/2010  . GLUCOSE INTOLERANCE 01/28/2010  . HYPERLIPIDEMIA 02/07/2008  . HYPOKALEMIA 01/28/2010  . ANEMIA-NOS 01/28/2010  . ERECTILE DYSFUNCTION 02/24/2009  . DEPRESSION 06/12/2007  . Carpal tunnel syndrome 05/20/2008  . HYPERTENSION 06/09/2007  . CAD 02/17/2010  . HEMORRHOIDS 06/09/2007  . URI 08/19/2008  . GERD 06/09/2007  . SHOULDER PAIN, LEFT 04/25/2008  . LOW BACK PAIN 06/12/2007  . PARESTHESIA 04/25/2008  . CAROTID BRUIT 11/18/2009  . DYSPNEA 07/28/2010  . CHEST PAIN 09/26/2009  . MYOCARDIAL PERFUSION SCAN, WITH STRESS TEST, ABNORMAL 10/08/2009  . Impaired glucose tolerance 01/23/2011  . Cervical radiculitis 04/17/2012  . Allergic rhinitis, cause unspecified 01/31/2013  . BPH (benign prostatic hypertrophy) 04/25/2013  . GERD (gastroesophageal reflux disease)    Past Surgical History  Procedure Laterality Date  . Coronary stent placement      from right radial  . Colonoscopy      reports that he has never smoked. He has never used smokeless tobacco. He reports that he does not drink alcohol or use illicit drugs. family history includes Alcohol abuse in his brother and sister; Coronary artery disease in his other; Heart  attack in his other; Heart disease in his father and mother; Hyperlipidemia in his father and mother; Hypertension in his father, mother, and other; Stroke in his other. Allergies  Allergen Reactions  . Diclofenac Sodium     REACTION: maks pt drowsey   Current Outpatient Prescriptions on File Prior to Visit  Medication Sig Dispense Refill  . amLODipine (NORVASC) 10 MG tablet Take 1 tablet (10 mg total) by mouth daily.  30 tablet  11  . aspirin 81 MG tablet Take 1 tablet (81 mg total) by mouth daily.  90 tablet  4  . carvedilol (COREG) 12.5 MG tablet TAKE ONE TABLET BY MOUTH TWICE DAILY  180 tablet  3  . clopidogrel (PLAVIX) 75 MG tablet TAKE ONE TABLET BY MOUTH ONCE DAILY  90 tablet  3  . colchicine 0.6 MG tablet Take 1 tablet (0.6 mg total) by mouth daily.  90 tablet  3  . CRESTOR 10 MG tablet TAKE ONE TABLET BY MOUTH ONCE DAILY  30 tablet  5  . dexlansoprazole (DEXILANT) 60 MG capsule Take 1 capsule (60 mg total) by mouth daily.  90 capsule  3  . fexofenadine (ALLEGRA) 180 MG tablet Take 1 tablet (180 mg total) by mouth daily. As needed  90 tablet  3  . fluticasone (FLONASE) 50 MCG/ACT nasal spray Place 2 sprays into the nose daily.  16 g  2  . lisinopril-hydrochlorothiazide (PRINZIDE,ZESTORETIC) 10-12.5 MG per tablet Take 1 tablet by mouth  2 (two) times daily.      . meloxicam (MOBIC) 7.5 MG tablet TAKE ONE TO TWO TABLETS BY MOUTH ONCE DAILY AS NEEDED FOR PAIN  60 tablet  3  . Na Sulfate-K Sulfate-Mg Sulf (SUPREP BOWEL PREP) SOLN Use as directed  2 Bottle  0  . potassium chloride (K-DUR) 10 MEQ tablet TAKE 2 TABLET BY MOUTH EVERY DAY  180 tablet  2  . tamsulosin (FLOMAX) 0.4 MG CAPS Take 1 capsule (0.4 mg total) by mouth daily.  90 capsule  3  . tiotropium (SPIRIVA) 18 MCG inhalation capsule Place 18 mcg into inhaler and inhale as needed.       No current facility-administered medications on file prior to visit.    Review of Systems  Constitutional: Negative for unusual diaphoresis or  other sweats  HENT: Negative for ringing in ear Eyes: Negative for double vision or worsening visual disturbance.  Respiratory: Negative for choking and stridor.   Gastrointestinal: Negative for vomiting or other signifcant bowel change Genitourinary: Negative for hematuria or decreased urine volume.  Musculoskeletal: Negative for other MSK pain or swelling Skin: Negative for color change and worsening wound.  Neurological: Negative for tremors and numbness other than noted  Psychiatric/Behavioral: Negative for decreased concentration or agitation other than above       Objective:   Physical Exam BP 132/80  Pulse 78  Temp(Src) 98.7 F (37.1 C) (Oral)  Wt 207 lb 6 oz (94.065 kg)  SpO2 95% VS noted,  Constitutional: Pt appears well-developed, well-nourished.  HENT: Head: NCAT.  Right Ear: External ear normal.  Left Ear: External ear normal.  Eyes: . Pupils are equal, round, and reactive to light. Conjunctivae and EOM are normal Neck: Normal range of motion. Neck supple.  Cardiovascular: Normal rate and regular rhythm.   Pulmonary/Chest: Effort normal and breath sounds normal.  Abd:  Soft, NT, ND, + BS Neurological: Pt is alert. Not confused , motor grossly intact, sens/gait intact Skin: Skin is warm. No rash Psychiatric: Pt behavior is normal. No agitation.  Spine: nontender; has bilat lumbar paravertebral tender    Assessment & Plan:

## 2014-04-04 NOTE — Assessment & Plan Note (Signed)
stable overall by history and exam, recent data reviewed with pt, and pt to continue medical treatment as before,  to f/u any worsening symptoms or concerns Lab Results  Component Value Date   HGBA1C 6.3 11/01/2013   To call for cbg > 200 on steroid or onset polys

## 2014-04-04 NOTE — Patient Instructions (Signed)
You had the pain shot today (toradol 30 mg)  Please take all new medication as prescribed - the pain medication (hydrocodone), muscle relaxer (tizanidine), and prednisone (anti-inflammatory)  Please continue all other medications as before, and refills have been done if requested.  Please have the pharmacy call with any other refills you may need.  Please return for any worsening pain, numbness or weakness or other unusual symptoms

## 2014-04-04 NOTE — Progress Notes (Signed)
Pre visit review using our clinic review tool, if applicable. No additional management support is needed unless otherwise documented below in the visit note. 

## 2014-04-04 NOTE — Assessment & Plan Note (Signed)
stable overall by history and exam, recent data reviewed with pt, and pt to continue medical treatment as before,  to f/u any worsening symptoms or concerns BP Readings from Last 3 Encounters:  04/04/14 132/80  03/12/14 140/78  02/05/14 152/82

## 2014-04-04 NOTE — Assessment & Plan Note (Signed)
C/w prob strain, with possible underlying djd/ddd - for toradol IM, for vicodin prn, muscle relaxer prn, predpac asd,  to f/u any worsening symptoms or concerns, consider imaging for persitent or worsening pain, numb/weakness

## 2014-05-01 ENCOUNTER — Ambulatory Visit: Payer: Commercial Managed Care - HMO | Admitting: Internal Medicine

## 2014-05-01 DIAGNOSIS — Z0289 Encounter for other administrative examinations: Secondary | ICD-10-CM

## 2014-05-01 NOTE — Progress Notes (Signed)
   Subjective:    Patient ID: Travis Carey, male    DOB: 04/02/1941, 73 y.o.   MRN: [id number]  HPI    Review of Systems     Objective:   Physical Exam        Assessment & Plan:

## 2014-05-10 ENCOUNTER — Other Ambulatory Visit: Payer: Self-pay | Admitting: Internal Medicine

## 2014-06-12 ENCOUNTER — Encounter: Payer: Self-pay | Admitting: Internal Medicine

## 2014-06-12 ENCOUNTER — Ambulatory Visit (INDEPENDENT_AMBULATORY_CARE_PROVIDER_SITE_OTHER): Payer: Commercial Managed Care - HMO | Admitting: Internal Medicine

## 2014-06-12 VITALS — BP 148/78 | HR 68 | Temp 98.4°F | Ht 68.0 in | Wt 204.0 lb

## 2014-06-12 DIAGNOSIS — J309 Allergic rhinitis, unspecified: Secondary | ICD-10-CM

## 2014-06-12 DIAGNOSIS — R7302 Impaired glucose tolerance (oral): Secondary | ICD-10-CM

## 2014-06-12 DIAGNOSIS — N508 Other specified disorders of male genital organs: Secondary | ICD-10-CM

## 2014-06-12 DIAGNOSIS — J438 Other emphysema: Secondary | ICD-10-CM

## 2014-06-12 DIAGNOSIS — Z0000 Encounter for general adult medical examination without abnormal findings: Secondary | ICD-10-CM

## 2014-06-12 DIAGNOSIS — M545 Low back pain, unspecified: Secondary | ICD-10-CM

## 2014-06-12 DIAGNOSIS — R7309 Other abnormal glucose: Secondary | ICD-10-CM

## 2014-06-12 DIAGNOSIS — N503 Cyst of epididymis: Secondary | ICD-10-CM

## 2014-06-12 DIAGNOSIS — Z23 Encounter for immunization: Secondary | ICD-10-CM

## 2014-06-12 MED ORDER — FEXOFENADINE HCL 180 MG PO TABS: 180.0000 mg | ORAL_TABLET | Freq: Every day | ORAL | Status: AC

## 2014-06-12 MED ORDER — FLUTICASONE PROPIONATE 50 MCG/ACT NA SUSP: 2.0000 | Freq: Every day | NASAL | Status: DC

## 2014-06-12 MED ORDER — METHYLPREDNISOLONE ACETATE 80 MG/ML IJ SUSP
80.0000 mg | Freq: Once | INTRAMUSCULAR | Status: AC
  Administered 2014-06-12: 80 mg via INTRAMUSCULAR

## 2014-06-12 NOTE — Assessment & Plan Note (Signed)
Chronic stable, for tylenol prn,  to f/u any worsening symptoms or concerns

## 2014-06-12 NOTE — Progress Notes (Signed)
Pre visit review using our clinic review tool, if applicable. No additional management support is needed unless otherwise documented below in the visit note. 

## 2014-06-12 NOTE — Assessment & Plan Note (Signed)
stable overall by history and exam, recent data reviewed with pt, and pt to continue medical treatment as before,  to f/u any worsening symptoms or concerns SpO2 Readings from Last 3 Encounters:  06/12/14 96%  04/04/14 95%  02/05/14 96%

## 2014-06-12 NOTE — Patient Instructions (Addendum)
You had the flu shot today, and the steroid shot for allergies  Please take all new medication as prescribed  - the allegra and flonase  Please continue all other medications as before, and refills have been done if requested.  Please have the pharmacy call with any other refills you may need.  Please keep your appointments with your specialists as you may have planned  It is felt that you do not need further labs today  Please return in 6 months, or sooner if needed, with Lab testing done 3-5 days before

## 2014-06-12 NOTE — Progress Notes (Signed)
Subjective:    Patient ID: Travis Carey, male    DOB: September 18, 1941, 73 y.o.   MRN: [id number]  HPI   Here to fu, c/o left testicle lump, no change for years in size, but has some intermittent left groin pain, wonders if related. Pt continues to have recurring LBP without change in severity, bowel or bladder change, fever, wt loss,  worsening LE pain/numbness/weakness, gait change or falls. C/o lower back stiffness primarily in the AM Denies urinary symptoms such as dysuria, frequency, urgency, flank pain, hematuria or n/v, fever, chills.  Still has 1 time per night nocturia, only more if he drinks pm fluids.  Pt denies chest pain, increased sob or doe, wheezing, orthopnea, PND, increased LE swelling, palpitations, dizziness or syncope.  Forgot to take Bp med this am. Pt denies new neurological symptoms such as new headache, or facial or extremity weakness or numbness.  BP at home has been < 140/90, usually 130's/79. Feels bad at lower than that.  Does have several wks ongoing nasal allergy symptoms with clearish congestion, itch and sneezing, without fever, pain, ST, cough, swelling or wheezing.  Ran out of allergy meds, needs re-start Past Medical History  Diagnosis Date  . VITAMIN B12 DEFICIENCY 02/09/2010  . GLUCOSE INTOLERANCE 01/28/2010  . HYPERLIPIDEMIA 02/07/2008  . HYPOKALEMIA 01/28/2010  . ANEMIA-NOS 01/28/2010  . ERECTILE DYSFUNCTION 02/24/2009  . DEPRESSION 06/12/2007  . Carpal tunnel syndrome 05/20/2008  . HYPERTENSION 06/09/2007  . CAD 02/17/2010  . HEMORRHOIDS 06/09/2007  . URI 08/19/2008  . GERD 06/09/2007  . SHOULDER PAIN, LEFT 04/25/2008  . LOW BACK PAIN 06/12/2007  . PARESTHESIA 04/25/2008  . CAROTID BRUIT 11/18/2009  . DYSPNEA 07/28/2010  . CHEST PAIN 09/26/2009  . MYOCARDIAL PERFUSION SCAN, WITH STRESS TEST, ABNORMAL 10/08/2009  . Impaired glucose tolerance 01/23/2011  . Cervical radiculitis 04/17/2012  . Allergic rhinitis, cause unspecified 01/31/2013  . BPH (benign prostatic hypertrophy)  04/25/2013  . GERD (gastroesophageal reflux disease)    Past Surgical History  Procedure Laterality Date  . Coronary stent placement      from right radial  . Colonoscopy      reports that he has never smoked. He has never used smokeless tobacco. He reports that he does not drink alcohol or use illicit drugs. family history includes Alcohol abuse in his brother and sister; Coronary artery disease in his other; Heart attack in his other; Heart disease in his father and mother; Hyperlipidemia in his father and mother; Hypertension in his father, mother, and other; Stroke in his other. Allergies  Allergen Reactions  . Diclofenac Sodium     REACTION: maks pt drowsey   Current Outpatient Prescriptions on File Prior to Visit  Medication Sig Dispense Refill  . amLODipine (NORVASC) 10 MG tablet Take 1 tablet (10 mg total) by mouth daily.  30 tablet  11  . aspirin 81 MG tablet Take 1 tablet (81 mg total) by mouth daily.  90 tablet  4  . carvedilol (COREG) 12.5 MG tablet TAKE ONE TABLET BY MOUTH TWICE DAILY  180 tablet  3  . clopidogrel (PLAVIX) 75 MG tablet TAKE ONE TABLET BY MOUTH ONCE DAILY  90 tablet  3  . colchicine 0.6 MG tablet Take 1 tablet (0.6 mg total) by mouth daily.  90 tablet  3  . CRESTOR 10 MG tablet TAKE ONE TABLET BY MOUTH ONCE DAILY  30 tablet  5  . dexlansoprazole (DEXILANT) 60 MG capsule Take 1 capsule (60 mg total) by  mouth daily.  90 capsule  3  . fexofenadine (ALLEGRA) 180 MG tablet Take 1 tablet (180 mg total) by mouth daily. As needed  90 tablet  3  . fluticasone (FLONASE) 50 MCG/ACT nasal spray Place 2 sprays into the nose daily.  16 g  2  . HYDROcodone-acetaminophen (NORCO/VICODIN) 5-325 MG per tablet Take 1 tablet by mouth every 6 (six) hours as needed for moderate pain.  60 tablet  0  . lisinopril-hydrochlorothiazide (PRINZIDE,ZESTORETIC) 10-12.5 MG per tablet Take 1 tablet by mouth 2 (two) times daily.      . meloxicam (MOBIC) 7.5 MG tablet TAKE ONE TO TWO TABLETS BY  MOUTH ONCE DAILY AS NEEDED FOR PAIN  60 tablet  3  . Na Sulfate-K Sulfate-Mg Sulf (SUPREP BOWEL PREP) SOLN Use as directed  2 Bottle  0  . potassium chloride (K-DUR) 10 MEQ tablet TAKE TWO TABLETS BY MOUTH ONCE DAILY  180 tablet  0  . predniSONE (DELTASONE) 10 MG tablet 3 tabs by mouth per day for 3 days,2tabs per day for 3 days,1tab per day for 3 days  18 tablet  0  . tamsulosin (FLOMAX) 0.4 MG CAPS Take 1 capsule (0.4 mg total) by mouth daily.  90 capsule  3  . tiZANidine (ZANAFLEX) 4 MG tablet Take 1 tablet (4 mg total) by mouth every 6 (six) hours as needed for muscle spasms.  40 tablet  1  . tiotropium (SPIRIVA) 18 MCG inhalation capsule Place 18 mcg into inhaler and inhale as needed.       No current facility-administered medications on file prior to visit.   Review of Systems  Constitutional: Negative for unusual diaphoresis or other sweats  HENT: Negative for ringing in ear Eyes: Negative for double vision or worsening visual disturbance.  Respiratory: Negative for choking and stridor.   Gastrointestinal: Negative for vomiting or other signifcant bowel change Genitourinary: Negative for hematuria or decreased urine volume.  Musculoskeletal: Negative for other MSK pain or swelling Skin: Negative for color change and worsening wound.  Neurological: Negative for tremors and numbness other than noted  Psychiatric/Behavioral: Negative for decreased concentration or agitation other than above       Objective:   Physical Exam BP 148/78  Pulse 68  Temp(Src) 98.4 F (36.9 C) (Oral)  Ht 5\' 8"  (1.727 m)  Wt 204 lb (92.534 kg)  BMI 31.03 kg/m2  SpO2 96% VS noted,  Constitutional: Pt appears well-developed, well-nourished.  HENT: Head: NCAT.  Right Ear: External ear normal.  Left Ear: External ear normal.  Bilat tm's with mild erythema.  Max sinus areas non tender.  Pharynx with mild erythema, no exudate  Eyes: . Pupils are equal, round, and reactive to light. Conjunctivae and EOM  are normal Neck: Normal range of motion. Neck supple.  Cardiovascular: Normal rate and regular rhythm.   Pulmonary/Chest: Effort normal and breath sounds normal.  Spine nontender Neurological: Pt is alert. Not confused , motor grossly intact, gait intact GU: normal male ext genitalia with mild tender left epididymal cyst Skin: Skin is warm. No rash Psychiatric: Pt behavior is normal. No agitation.     Assessment & Plan:

## 2014-06-12 NOTE — Assessment & Plan Note (Signed)
D/w pt, benign most likely, ok to follow, tylenol prn

## 2014-06-12 NOTE — Addendum Note (Signed)
Addended by: Sharon Seller B on: 06/12/2014 10:25 AM   Modules accepted: Orders

## 2014-07-18 ENCOUNTER — Other Ambulatory Visit: Payer: Self-pay | Admitting: Cardiovascular Disease

## 2014-08-27 ENCOUNTER — Other Ambulatory Visit: Payer: Self-pay | Admitting: Internal Medicine

## 2014-10-02 ENCOUNTER — Other Ambulatory Visit: Payer: Self-pay | Admitting: Internal Medicine

## 2014-10-10 ENCOUNTER — Other Ambulatory Visit: Payer: Self-pay | Admitting: Internal Medicine

## 2014-10-10 ENCOUNTER — Other Ambulatory Visit: Payer: Self-pay | Admitting: Cardiovascular Disease

## 2014-11-06 ENCOUNTER — Other Ambulatory Visit: Payer: Self-pay | Admitting: Internal Medicine

## 2014-11-17 ENCOUNTER — Other Ambulatory Visit: Payer: Self-pay | Admitting: Internal Medicine

## 2014-11-27 ENCOUNTER — Ambulatory Visit (INDEPENDENT_AMBULATORY_CARE_PROVIDER_SITE_OTHER): Payer: Commercial Managed Care - HMO | Admitting: Internal Medicine

## 2014-11-27 ENCOUNTER — Encounter: Payer: Self-pay | Admitting: Internal Medicine

## 2014-11-27 ENCOUNTER — Other Ambulatory Visit: Payer: Self-pay | Admitting: Internal Medicine

## 2014-11-27 ENCOUNTER — Other Ambulatory Visit (INDEPENDENT_AMBULATORY_CARE_PROVIDER_SITE_OTHER): Payer: Commercial Managed Care - HMO

## 2014-11-27 VITALS — BP 168/90 | HR 64 | Temp 98.5°F | Wt 206.0 lb

## 2014-11-27 DIAGNOSIS — R7302 Impaired glucose tolerance (oral): Secondary | ICD-10-CM

## 2014-11-27 DIAGNOSIS — J309 Allergic rhinitis, unspecified: Secondary | ICD-10-CM

## 2014-11-27 DIAGNOSIS — I10 Essential (primary) hypertension: Secondary | ICD-10-CM

## 2014-11-27 DIAGNOSIS — R351 Nocturia: Secondary | ICD-10-CM | POA: Diagnosis not present

## 2014-11-27 DIAGNOSIS — M79601 Pain in right arm: Secondary | ICD-10-CM

## 2014-11-27 DIAGNOSIS — Z0000 Encounter for general adult medical examination without abnormal findings: Secondary | ICD-10-CM | POA: Diagnosis not present

## 2014-11-27 LAB — URINALYSIS, ROUTINE W REFLEX MICROSCOPIC
Bilirubin Urine: NEGATIVE
HGB URINE DIPSTICK: NEGATIVE
KETONES UR: NEGATIVE
Nitrite: NEGATIVE
Specific Gravity, Urine: 1.015 (ref 1.000–1.030)
UROBILINOGEN UA: 0.2 (ref 0.0–1.0)
Urine Glucose: NEGATIVE
pH: 6.5 (ref 5.0–8.0)

## 2014-11-27 LAB — HEPATIC FUNCTION PANEL
ALBUMIN: 4 g/dL (ref 3.5–5.2)
ALT: 15 U/L (ref 0–53)
AST: 17 U/L (ref 0–37)
Alkaline Phosphatase: 73 U/L (ref 39–117)
Bilirubin, Direct: 0.1 mg/dL (ref 0.0–0.3)
Total Bilirubin: 0.4 mg/dL (ref 0.2–1.2)
Total Protein: 7.1 g/dL (ref 6.0–8.3)

## 2014-11-27 LAB — BASIC METABOLIC PANEL
BUN: 13 mg/dL (ref 6–23)
CHLORIDE: 104 meq/L (ref 96–112)
CO2: 29 mEq/L (ref 19–32)
CREATININE: 0.99 mg/dL (ref 0.40–1.50)
Calcium: 9.5 mg/dL (ref 8.4–10.5)
GFR: 95.32 mL/min (ref >60.00)
Glucose, Bld: 103 mg/dL — ABNORMAL HIGH (ref 70–99)
POTASSIUM: 3.4 meq/L — AB (ref 3.5–5.1)
Sodium: 140 mEq/L (ref 135–145)

## 2014-11-27 LAB — TSH: TSH: 2.36 u[IU]/mL (ref 0.35–4.50)

## 2014-11-27 LAB — LIPID PANEL
CHOL/HDL RATIO: 5
Cholesterol: 157 mg/dL (ref 0–200)
HDL: 32.1 mg/dL — ABNORMAL LOW (ref >39.00)
NonHDL: 124.9
TRIGLYCERIDES: 253 mg/dL — AB (ref 0.0–149.0)
VLDL: 50.6 mg/dL — AB (ref 0.0–40.0)

## 2014-11-27 LAB — PSA: PSA: 0.62 ng/mL (ref 0.10–4.00)

## 2014-11-27 LAB — CBC WITH DIFFERENTIAL/PLATELET
BASOS PCT: 0.3 % (ref 0.0–3.0)
Basophils Absolute: 0 10*3/uL (ref 0.0–0.1)
EOS PCT: 2 % (ref 0.0–5.0)
Eosinophils Absolute: 0.1 10*3/uL (ref 0.0–0.7)
HCT: 38.9 % — ABNORMAL LOW (ref 39.0–52.0)
Hemoglobin: 13.3 g/dL (ref 13.0–17.0)
Lymphocytes Relative: 41.2 % (ref 12.0–46.0)
Lymphs Abs: 2.6 10*3/uL (ref 0.7–4.0)
MCHC: 34.1 g/dL (ref 30.0–36.0)
MCV: 88.1 fl (ref 78.0–100.0)
MONOS PCT: 9 % (ref 3.0–12.0)
Monocytes Absolute: 0.6 10*3/uL (ref 0.1–1.0)
NEUTROS PCT: 47.5 % (ref 43.0–77.0)
Neutro Abs: 3 10*3/uL (ref 1.4–7.7)
Platelets: 216 10*3/uL (ref 150.0–400.0)
RBC: 4.42 Mil/uL (ref 4.22–5.81)
RDW: 13.9 % (ref 11.5–15.5)
WBC: 6.2 10*3/uL (ref 4.0–10.5)

## 2014-11-27 LAB — LDL CHOLESTEROL, DIRECT: Direct LDL: 80 mg/dL

## 2014-11-27 LAB — HEMOGLOBIN A1C: Hgb A1c MFr Bld: 6.5 % (ref 4.6–6.5)

## 2014-11-27 MED ORDER — HYDROCODONE-ACETAMINOPHEN 5-325 MG PO TABS: 1.0000 | ORAL_TABLET | Freq: Four times a day (QID) | ORAL | Status: DC | PRN

## 2014-11-27 MED ORDER — CIPROFLOXACIN HCL 500 MG PO TABS: 500.0000 mg | ORAL_TABLET | Freq: Two times a day (BID) | ORAL | Status: DC

## 2014-11-27 MED ORDER — PREDNISONE 10 MG PO TABS: ORAL_TABLET | ORAL | Status: DC

## 2014-11-27 MED ORDER — METHYLPREDNISOLONE ACETATE 80 MG/ML IJ SUSP
80.0000 mg | Freq: Once | INTRAMUSCULAR | Status: AC
  Administered 2014-11-27: 80 mg via INTRAMUSCULAR

## 2014-11-27 NOTE — Progress Notes (Signed)
Pre visit review using our clinic review tool, if applicable. No additional management support is needed unless otherwise documented below in the visit note. 

## 2014-11-27 NOTE — Patient Instructions (Signed)
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  Please continue to monitor your Blood Pressure at home as you do  Please have the pharmacy call with any other refills you may need.  Please keep your appointments with your specialists as you may have planned  You will be contacted regarding the referral for: urology

## 2014-11-27 NOTE — Progress Notes (Signed)
Subjective:    Patient ID: Travis Carey, male    DOB: 1940-12-27, 74 y.o.   MRN: [id number]  HPI  Here with 1 mo gradually worsening distal RUe pain and swelling, ? Assoc with working more on his car lately with right hand, with diffuse swelling and tenderness primarily to hand and wrist with overt puffiness and tender, though no erythema, and swelling tapering to non (and no pain) at about right mid arm, though occas some pain will radiate sharp fleeting to the right elbow area.  Color good, no hx of DVT or arter insuff, and no neck or other neuritic pain. Has occurred once in the past (seen here) and resolved with rest and predpack trial.  Pt denies new neurological symptoms such as new headache, or facial or extremity weakness or numbness.  No fever, redness or trauma. Does have several wks ongoing nasal allergy symptoms with clearish congestion, itch and sneezing, without fever, pain, ST, cough, swelling or wheezing. Pt also continues to have recurring right LBP without change in severity, bowel or bladder change, fever, wt loss,  worsening LE pain/numbness/weakness, gait change or falls. Also has mild worsening nocturia, though does not think slower flow or hesitancy or retention, and Denies urinary symptoms such as dysuria, frequency, urgency, flank pain, hematuria or n/v, fever, chills. Does tend to drink some fluids in evening more than lately.  Past Medical History  Diagnosis Date  . VITAMIN B12 DEFICIENCY 02/09/2010  . GLUCOSE INTOLERANCE 01/28/2010  . HYPERLIPIDEMIA 02/07/2008  . HYPOKALEMIA 01/28/2010  . ANEMIA-NOS 01/28/2010  . ERECTILE DYSFUNCTION 02/24/2009  . DEPRESSION 06/12/2007  . Carpal tunnel syndrome 05/20/2008  . HYPERTENSION 06/09/2007  . CAD 02/17/2010  . HEMORRHOIDS 06/09/2007  . URI 08/19/2008  . GERD 06/09/2007  . SHOULDER PAIN, LEFT 04/25/2008  . LOW BACK PAIN 06/12/2007  . PARESTHESIA 04/25/2008  . CAROTID BRUIT 11/18/2009  . DYSPNEA 07/28/2010  . CHEST PAIN 09/26/2009  .  MYOCARDIAL PERFUSION SCAN, WITH STRESS TEST, ABNORMAL 10/08/2009  . Impaired glucose tolerance 01/23/2011  . Cervical radiculitis 04/17/2012  . Allergic rhinitis, cause unspecified 01/31/2013  . BPH (benign prostatic hypertrophy) 04/25/2013  . GERD (gastroesophageal reflux disease)    Past Surgical History  Procedure Laterality Date  . Coronary stent placement      from right radial  . Colonoscopy      reports that he has never smoked. He has never used smokeless tobacco. He reports that he does not drink alcohol or use illicit drugs. family history includes Alcohol abuse in his brother and sister; Coronary artery disease in his other; Heart attack in his other; Heart disease in his father and mother; Hyperlipidemia in his father and mother; Hypertension in his father, mother, and other; Stroke in his other. Allergies  Allergen Reactions  . Diclofenac Sodium     REACTION: maks pt drowsey   Current Outpatient Prescriptions on File Prior to Visit  Medication Sig Dispense Refill  . amLODipine (NORVASC) 10 MG tablet TAKE ONE TABLET BY MOUTH ONCE DAILY 30 tablet 3  . aspirin 81 MG tablet Take 1 tablet (81 mg total) by mouth daily. 90 tablet 4  . carvedilol (COREG) 12.5 MG tablet TAKE ONE TABLET BY MOUTH TWICE DAILY 180 tablet 3  . clopidogrel (PLAVIX) 75 MG tablet TAKE ONE TABLET BY MOUTH ONCE DAILY 90 tablet 3  . colchicine 0.6 MG tablet Take 1 tablet (0.6 mg total) by mouth daily. 90 tablet 3  . CRESTOR 10 MG tablet TAKE  ONE TABLET BY MOUTH ONCE DAILY 30 tablet 3  . dexlansoprazole (DEXILANT) 60 MG capsule Take 1 capsule (60 mg total) by mouth daily. 90 capsule 3  . fexofenadine (ALLEGRA) 180 MG tablet Take 1 tablet (180 mg total) by mouth daily. As needed 90 tablet 3  . fluticasone (FLONASE) 50 MCG/ACT nasal spray Place 2 sprays into both nostrils daily. 16 g 5  . lisinopril-hydrochlorothiazide (PRINZIDE,ZESTORETIC) 10-12.5 MG per tablet Take 1 tablet by mouth 2 (two) times daily.    .  meloxicam (MOBIC) 7.5 MG tablet TAKE ONE TO TWO TABLETS BY MOUTH ONCE DAILY AS NEEDED FOR PAIN 60 tablet 0  . Na Sulfate-K Sulfate-Mg Sulf (SUPREP BOWEL PREP) SOLN Use as directed 2 Bottle 0  . potassium chloride (K-DUR) 10 MEQ tablet TAKE TWO TABLETS BY MOUTH ONCE DAILY 180 tablet 3  . tamsulosin (FLOMAX) 0.4 MG CAPS capsule TAKE ONE CAPSULE BY MOUTH ONCE DAILY 90 capsule 1  . tiZANidine (ZANAFLEX) 4 MG tablet Take 1 tablet (4 mg total) by mouth every 6 (six) hours as needed for muscle spasms. 40 tablet 1  . tiotropium (SPIRIVA) 18 MCG inhalation capsule Place 18 mcg into inhaler and inhale as needed.     No current facility-administered medications on file prior to visit.     Review of Systems  Constitutional: Negative for unusual diaphoresis or other sweats  HENT: Negative for ringing in ear Eyes: Negative for double vision or worsening visual disturbance.  Respiratory: Negative for choking and stridor.   Gastrointestinal: Negative for vomiting or other signifcant bowel change Genitourinary: Negative for hematuria or decreased urine volume.  Musculoskeletal: Negative for other MSK pain or swelling Skin: Negative for color change and worsening wound.  Neurological: Negative for tremors and numbness other than noted  Psychiatric/Behavioral: Negative for decreased concentration or agitation other than above       Objective:   Physical Exam BP 168/90 mmHg  Pulse 64  Temp(Src) 98.5 F (36.9 C) (Oral)  Wt 206 lb (93.441 kg) VS noted,  Constitutional: Pt appears well-developed, well-nourished.  HENT: Head: NCAT.  Right Ear: External ear normal.  Left Ear: External ear normal.  Eyes: . Pupils are equal, round, and reactive to light. Conjunctivae and EOM are normal Neck: Normal range of motion. Neck supple.  Cardiovascular: Normal rate and regular rhythm.   Pulmonary/Chest: Effort normal and breath sounds without rales or wheezing.  Abd:  Soft, NT, ND, + BS Right hand and mid to  distal arm with diffuse non discrete 1-2 swelling and tender without erythema, other skin change, o/w neurovasc intact, capillary refill normal Neurological: Pt is alert. Not confused , motor 5/5 intact, no reduced sens to UE's, DTR symmetric, gait normal Skin: Skin is warm. No rash Psychiatric: Pt behavior is normal. No agitation.     Assessment & Plan:

## 2014-11-27 NOTE — Assessment & Plan Note (Signed)
elev today liekly reactive, ow/ stable overall by history and exam, recent data reviewed with pt, and pt to continue medical treatment as before - declines med change,  to f/u any worsening symptoms or concerns, and f/u BP at home and next visit BP Readings from Last 3 Encounters:  11/27/14 168/90  06/12/14 148/78  04/04/14 132/80

## 2014-11-27 NOTE — Assessment & Plan Note (Signed)
Appears to be difffuse MSK strain/tendonitis of distal RUE and hand related to overuse; to avoid working on cars, repeat predpac as before, and pain control med,  to f/u any worsening symptoms or concerns

## 2014-11-27 NOTE — Assessment & Plan Note (Signed)
?   Prostate vs other - for urology referral per pt request

## 2014-11-27 NOTE — Assessment & Plan Note (Signed)
Also for depomedrol IM today for arm as well as allergic symtpoms, re-start Otc allergy controll meds such asa allegra,  to f/u any worsening symptoms or concerns

## 2014-11-30 ENCOUNTER — Other Ambulatory Visit: Payer: Self-pay | Admitting: Cardiovascular Disease

## 2014-12-11 ENCOUNTER — Ambulatory Visit: Payer: Commercial Managed Care - HMO | Admitting: Internal Medicine

## 2014-12-12 ENCOUNTER — Ambulatory Visit: Payer: Commercial Managed Care - HMO | Admitting: Internal Medicine

## 2014-12-26 ENCOUNTER — Telehealth: Payer: Self-pay | Admitting: Internal Medicine

## 2014-12-26 NOTE — Telephone Encounter (Signed)
Pt had to reschedule his urology appt today because we didn't send anything about his insurance info to them. Pt is very un happy about this and need this to be taken care of ASAP.

## 2014-12-26 NOTE — Telephone Encounter (Signed)
Spoke w/pt. He states his appointment has been rescheduled for Monday. I called [facility] and they confirmed that they do have the [facility] referral.

## 2014-12-30 DIAGNOSIS — R3912 Poor urinary stream: Secondary | ICD-10-CM | POA: Diagnosis not present

## 2015-01-02 ENCOUNTER — Ambulatory Visit: Payer: Commercial Managed Care - HMO | Admitting: Internal Medicine

## 2015-01-05 ENCOUNTER — Other Ambulatory Visit: Payer: Self-pay | Admitting: Internal Medicine

## 2015-02-07 ENCOUNTER — Other Ambulatory Visit: Payer: Self-pay | Admitting: *Deleted

## 2015-02-07 ENCOUNTER — Other Ambulatory Visit: Payer: Self-pay | Admitting: Internal Medicine

## 2015-02-07 MED ORDER — AMLODIPINE BESYLATE 10 MG PO TABS: 10.0000 mg | ORAL_TABLET | Freq: Every day | ORAL | Status: DC

## 2015-02-24 ENCOUNTER — Other Ambulatory Visit: Payer: Self-pay | Admitting: Internal Medicine

## 2015-03-26 ENCOUNTER — Telehealth: Payer: Self-pay | Admitting: Internal Medicine

## 2015-03-26 NOTE — Telephone Encounter (Signed)
Patient called to schedule for Sat. Clinic and pt is awaiting call from [place] on Friday afternoon to schedule.

## 2015-03-28 NOTE — Telephone Encounter (Signed)
Have patient scheduled for 9am on Sat 6/18. Left message on vm at number [phone], (number that I was told to call) 6/17 at 1:30.

## 2015-03-29 ENCOUNTER — Ambulatory Visit (INDEPENDENT_AMBULATORY_CARE_PROVIDER_SITE_OTHER): Payer: Commercial Managed Care - HMO | Admitting: Family Medicine

## 2015-03-29 ENCOUNTER — Encounter: Payer: Self-pay | Admitting: Family Medicine

## 2015-03-29 VITALS — BP 154/80 | HR 88 | Temp 98.1°F | Ht 68.0 in | Wt 213.5 lb

## 2015-03-29 DIAGNOSIS — J309 Allergic rhinitis, unspecified: Secondary | ICD-10-CM | POA: Diagnosis not present

## 2015-03-29 DIAGNOSIS — E1165 Type 2 diabetes mellitus with hyperglycemia: Secondary | ICD-10-CM | POA: Diagnosis not present

## 2015-03-29 DIAGNOSIS — E119 Type 2 diabetes mellitus without complications: Secondary | ICD-10-CM | POA: Insufficient documentation

## 2015-03-29 DIAGNOSIS — R350 Frequency of micturition: Secondary | ICD-10-CM | POA: Insufficient documentation

## 2015-03-29 DIAGNOSIS — IMO0002 Reserved for concepts with insufficient information to code with codable children: Secondary | ICD-10-CM | POA: Insufficient documentation

## 2015-03-29 LAB — POCT URINALYSIS DIPSTICK
Bilirubin, UA: NEGATIVE
KETONES UA: NEGATIVE
LEUKOCYTES UA: NEGATIVE
NITRITE UA: NEGATIVE
PROTEIN UA: NEGATIVE
RBC UA: NEGATIVE
SPEC GRAV UA: 1.01
UROBILINOGEN UA: 0.2
pH, UA: 5

## 2015-03-29 LAB — POCT GLUCOSE (DEVICE FOR HOME USE): POC Glucose: 260 mg/dl — AB (ref 70–99)

## 2015-03-29 NOTE — Assessment & Plan Note (Signed)
No sign of infection

## 2015-03-29 NOTE — Progress Notes (Signed)
Pre visit review using our clinic review tool, if applicable. No additional management support is needed unless otherwise documented below in the visit note. 

## 2015-03-29 NOTE — Progress Notes (Signed)
   Subjective:    Patient ID: Travis Carey, male    DOB: 10/06/1941, 74 y.o.   MRN: [id number]  Dysuria  This is a new problem. The current episode started in the past 7 days. The problem has been gradually worsening. The quality of the pain is described as burning (mild). The pain is mild. There has been no fever. He is not sexually active. There is no history of pyelonephritis. Associated symptoms include frequency. Pertinent negatives include no hematuria, nausea or urgency. Associated symptoms comments:  Dark urine. He has tried increased fluids (on flomax) for the symptoms. The treatment provided moderate relief. His past medical history is significant for recurrent UTIs. There is no history of catheterization, kidney stones, a single kidney, urinary stasis or a urological procedure.  Cough This is a new problem. The current episode started 1 to 4 weeks ago (Gets better off and on, better in last few days). The problem has been waxing and waning. The cough is productive of sputum. Associated symptoms include nasal congestion and postnasal drip. Pertinent negatives include no chest pain, ear congestion, ear pain, fever, headaches, sore throat or shortness of breath. The symptoms are aggravated by lying down. Risk factors: nonsmoker. Treatments tried: vicks vapor rub. The treatment provided moderate relief. His past medical history is significant for environmental allergies. There is no history of asthma, bronchitis, COPD, emphysema or pneumonia.     74 year old male with history of CAD, HTN and BPH presents with urinary symptoms and new onset cough.   On allegra, flonase 2 sprays for allergies.  Recent new pt OV with [facility].. Treated with flomax for years, recent increase given at that time noting nocturia and weak stream. Note reviewed in detail.   Social History /Family History/Past Medical History reviewed and updated if needed.  Glucose in urine .  Nonfasting ( 1 hours ago  jelly biscuit and coffee with cream and sugar), CBG today: 260. Lab Results  Component Value Date   HGBA1C 6.5 11/27/2014  Restart cortisone shot 3 months ago.    Review of Systems  Constitutional: Negative for fever.  HENT: Positive for postnasal drip. Negative for ear pain and sore throat.   Respiratory: Positive for cough. Negative for shortness of breath.   Cardiovascular: Negative for chest pain.  Gastrointestinal: Negative for nausea.  Genitourinary: Positive for dysuria and frequency. Negative for urgency and hematuria.  Allergic/Immunologic: Positive for environmental allergies.  Neurological: Negative for headaches.       Objective:   Physical Exam  Constitutional: Vital signs are normal. He appears well-developed and well-nourished.  obese  HENT:  Head: Normocephalic.  Right Ear: Hearing normal.  Left Ear: Hearing normal.  Nose: Nose normal.  Mouth/Throat: Oropharynx is clear and moist and mucous membranes are normal.  Neck: Trachea normal. Carotid bruit is not present. No thyroid mass and no thyromegaly present.  Cardiovascular: Normal rate, regular rhythm and normal pulses.  Exam reveals no gallop, no distant heart sounds and no friction rub.   No murmur heard. No peripheral edema  Pulmonary/Chest: Effort normal and breath sounds normal. No respiratory distress.  Abdominal: Soft. Normal appearance. There is no hepatosplenomegaly. There is no tenderness. There is no CVA tenderness.  Skin: Skin is warm, dry and intact. No rash noted.  Psychiatric: He has a normal mood and affect. His speech is normal and behavior is normal. Thought content normal.          Assessment & Plan:

## 2015-03-29 NOTE — Assessment & Plan Note (Addendum)
New diagnosis.. Likely causing increased frequency of urine.  Push water.  Work on low carb diet over weekend and follow up early next week with PCP for further eval and treatment.  Aso may be worse due to recent steroid use.

## 2015-03-29 NOTE — Assessment & Plan Note (Signed)
Treat with antihistamine, nasal steroid and irrigation and add mucolytic.

## 2015-03-29 NOTE — Addendum Note (Signed)
Addended by: Lowella Dandy on: 03/29/2015 10:31 AM   Modules accepted: Miquel Dunn

## 2015-03-29 NOTE — Patient Instructions (Addendum)
Restart flonase 2 spray per nostril, allegra, consider adding mucinex to break up mucus. Star nasal slaine spray or irrigation. Sugar in urine.. Follow up with primary MD on Monday for further evaluation  new diagnosis diabetes.  no sign of urine infection.

## 2015-04-01 ENCOUNTER — Other Ambulatory Visit: Payer: Self-pay | Admitting: Internal Medicine

## 2015-04-03 ENCOUNTER — Encounter: Payer: Self-pay | Admitting: Internal Medicine

## 2015-04-03 ENCOUNTER — Ambulatory Visit (INDEPENDENT_AMBULATORY_CARE_PROVIDER_SITE_OTHER): Payer: Commercial Managed Care - HMO | Admitting: Internal Medicine

## 2015-04-03 VITALS — BP 132/78 | HR 77 | Temp 98.8°F | Ht 68.0 in | Wt 210.8 lb

## 2015-04-03 DIAGNOSIS — M545 Low back pain, unspecified: Secondary | ICD-10-CM

## 2015-04-03 DIAGNOSIS — I10 Essential (primary) hypertension: Secondary | ICD-10-CM | POA: Diagnosis not present

## 2015-04-03 DIAGNOSIS — R7302 Impaired glucose tolerance (oral): Secondary | ICD-10-CM | POA: Diagnosis not present

## 2015-04-03 MED ORDER — PREDNISONE 10 MG PO TABS: ORAL_TABLET | ORAL | Status: DC

## 2015-04-03 MED ORDER — METHYLPREDNISOLONE ACETATE 80 MG/ML IJ SUSP
80.0000 mg | Freq: Once | INTRAMUSCULAR | Status: AC
  Administered 2015-04-03: 80 mg via INTRAMUSCULAR

## 2015-04-03 MED ORDER — HYDROCODONE-ACETAMINOPHEN 5-325 MG PO TABS: 1.0000 | ORAL_TABLET | Freq: Four times a day (QID) | ORAL | Status: DC | PRN

## 2015-04-03 NOTE — Progress Notes (Signed)
Subjective:    Patient ID: Travis Carey, male    DOB: 05-29-1941, 74 y.o.   MRN: [id number]  HPI  Here to f/u; overall doing ok,  Pt denies chest pain, increasing sob or doe, wheezing, orthopnea, PND, increased LE swelling, palpitations, dizziness or syncope.  Pt denies new neurological symptoms such as new headache, or facial or extremity weakness or numbness.  Pt denies polydipsia, polyuria, or low sugar episode.   Pt denies new neurological symptoms such as new headache, or facial or extremity weakness or numbness.   Pt states overall good compliance with meds, mostly trying to follow appropriate diet, with wt overall stable,  but little exercise however.  Had recent glc 260 after a meal. Pt continues to have recurring LBP with intermittent LLE radiating pain, but no bowel or bladder change, fever, wt loss, other worsening LE pain/numbness/weakness, gait change or falls. Past Medical History  Diagnosis Date  . VITAMIN B12 DEFICIENCY 02/09/2010  . GLUCOSE INTOLERANCE 01/28/2010  . HYPERLIPIDEMIA 02/07/2008  . HYPOKALEMIA 01/28/2010  . ANEMIA-NOS 01/28/2010  . ERECTILE DYSFUNCTION 02/24/2009  . DEPRESSION 06/12/2007  . Carpal tunnel syndrome 05/20/2008  . HYPERTENSION 06/09/2007  . CAD 02/17/2010  . HEMORRHOIDS 06/09/2007  . URI 08/19/2008  . GERD 06/09/2007  . SHOULDER PAIN, LEFT 04/25/2008  . LOW BACK PAIN 06/12/2007  . PARESTHESIA 04/25/2008  . CAROTID BRUIT 11/18/2009  . DYSPNEA 07/28/2010  . CHEST PAIN 09/26/2009  . MYOCARDIAL PERFUSION SCAN, WITH STRESS TEST, ABNORMAL 10/08/2009  . Impaired glucose tolerance 01/23/2011  . Cervical radiculitis 04/17/2012  . Allergic rhinitis, cause unspecified 01/31/2013  . BPH (benign prostatic hypertrophy) 04/25/2013  . GERD (gastroesophageal reflux disease)    Past Surgical History  Procedure Laterality Date  . Coronary stent placement      from right radial  . Colonoscopy      reports that he has never smoked. He has never used smokeless tobacco. He reports  that he does not drink alcohol or use illicit drugs. family history includes Alcohol abuse in his brother and sister; Coronary artery disease in his other; Heart attack in his other; Heart disease in his father and mother; Hyperlipidemia in his father and mother; Hypertension in his father, mother, and other; Stroke in his other. Allergies  Allergen Reactions  . Diclofenac Sodium     REACTION: maks pt drowsey   Current Outpatient Prescriptions on File Prior to Visit  Medication Sig Dispense Refill  . amLODipine (NORVASC) 10 MG tablet Take 1 tablet (10 mg total) by mouth daily. 30 tablet 3  . aspirin 81 MG tablet Take 1 tablet (81 mg total) by mouth daily. 90 tablet 4  . carvedilol (COREG) 12.5 MG tablet Take 1 tablet (12.5 mg total) by mouth 2 (two) times daily. 180 tablet 3  . clopidogrel (PLAVIX) 75 MG tablet TAKE ONE TABLET BY MOUTH ONCE DAILY 90 tablet 1  . colchicine 0.6 MG tablet Take 1 tablet (0.6 mg total) by mouth daily. 90 tablet 3  . CRESTOR 10 MG tablet TAKE ONE TABLET BY MOUTH ONCE DAILY 30 tablet 3  . dexlansoprazole (DEXILANT) 60 MG capsule Take 1 capsule (60 mg total) by mouth daily. 90 capsule 3  . fexofenadine (ALLEGRA) 180 MG tablet Take 1 tablet (180 mg total) by mouth daily. As needed 90 tablet 3  . fluticasone (FLONASE) 50 MCG/ACT nasal spray Place 2 sprays into both nostrils daily. 16 g 5  . lisinopril-hydrochlorothiazide (PRINZIDE,ZESTORETIC) 10-12.5 MG per tablet Take 1 tablet  by mouth 2 (two) times daily.    . meloxicam (MOBIC) 7.5 MG tablet TAKE ONE TO TWO TABLETS BY MOUTH ONCE DAILY AS NEEDED FOR PAIN 60 tablet 0  . potassium chloride (K-DUR) 10 MEQ tablet TAKE TWO TABLETS BY MOUTH ONCE DAILY 180 tablet 3  . tamsulosin (FLOMAX) 0.4 MG CAPS capsule TAKE ONE CAPSULE BY MOUTH ONCE DAILY 90 capsule 1  . tiotropium (SPIRIVA) 18 MCG inhalation capsule Place 18 mcg into inhaler and inhale as needed.     No current facility-administered medications on file prior to visit.     Review of Systems  Constitutional: Negative for unusual diaphoresis or night sweats HENT: Negative for ringing in ear or discharge Eyes: Negative for double vision or worsening visual disturbance.  Respiratory: Negative for choking and stridor.   Gastrointestinal: Negative for vomiting or other signifcant bowel change Genitourinary: Negative for hematuria or change in urine volume.  Musculoskeletal: Negative for other MSK pain or swelling Skin: Negative for color change and worsening wound.  Neurological: Negative for tremors and numbness other than noted  Psychiatric/Behavioral: Negative for decreased concentration or agitation other than above       Objective:   Physical Exam BP 132/78 mmHg  Pulse 77  Temp(Src) 98.8 F (37.1 C) (Oral)  Ht 5\' 8"  (1.727 m)  Wt 210 lb 12 oz (95.596 kg)  BMI 32.05 kg/m2  SpO2 95% VS noted,  Constitutional: Pt appears in no significant distress HENT: Head: NCAT.  Right Ear: External ear normal.  Left Ear: External ear normal.  Eyes: . Pupils are equal, round, and reactive to light. Conjunctivae and EOM are normal Neck: Normal range of motion. Neck supple.  Cardiovascular: Normal rate and regular rhythm.   Pulmonary/Chest: Effort normal and breath sounds without rales or wheezing.  Abd:  Soft, NT, ND, + BS Spine nontender Neurological: Pt is alert. Not confused , motor intact 5/5 throughout, gait intact Skin: Skin is warm. No rash, no LE edema Psychiatric: Pt behavior is normal. No agitation.     Assessment & Plan:

## 2015-04-03 NOTE — Patient Instructions (Addendum)
You had the steroid shot today (depomedrol 80 mg)  Please take all new medication as prescribed - the low dose prednisone  Please continue all other medications as before, and refills have been done if requested. - the pain medication  Please have the pharmacy call with any other refills you may need.  Please continue your efforts at being more active, low cholesterol diet, and weight control.  Please keep your appointments with your specialists as you may have planned  Please go to the LAB in the Basement (turn left off the elevator) for the tests to be done tonmorrow   You will be contacted by phone if any changes need to be made immediately.  Otherwise, you will receive a letter about your results with an explanation, but please check with MyChart first.  Please remember to sign up for MyChart if you have not done so, as this will be important to you in the future with finding out test results, communicating by private email, and scheduling acute appointments online when needed.  Please return in 6 months, or sooner if needed, with Lab testing done 3-5 days before

## 2015-04-03 NOTE — Progress Notes (Signed)
Pre visit review using our clinic review tool, if applicable. No additional management support is needed unless otherwise documented below in the visit note. 

## 2015-04-04 ENCOUNTER — Encounter: Payer: Self-pay | Admitting: Internal Medicine

## 2015-04-04 ENCOUNTER — Other Ambulatory Visit (INDEPENDENT_AMBULATORY_CARE_PROVIDER_SITE_OTHER): Payer: Commercial Managed Care - HMO

## 2015-04-04 DIAGNOSIS — R7302 Impaired glucose tolerance (oral): Secondary | ICD-10-CM

## 2015-04-04 LAB — BASIC METABOLIC PANEL
BUN: 18 mg/dL (ref 6–23)
CALCIUM: 9.6 mg/dL (ref 8.4–10.5)
CO2: 25 mEq/L (ref 19–32)
CREATININE: 1.03 mg/dL (ref 0.40–1.50)
Chloride: 109 mEq/L (ref 96–112)
GFR: 90.97 mL/min (ref >60.00)
Glucose, Bld: 137 mg/dL — ABNORMAL HIGH (ref 70–99)
Potassium: 3.7 mEq/L (ref 3.5–5.1)
Sodium: 142 mEq/L (ref 135–145)

## 2015-04-04 LAB — HEMOGLOBIN A1C: Hgb A1c MFr Bld: 6.2 % (ref 4.6–6.5)

## 2015-04-05 NOTE — Assessment & Plan Note (Signed)
stable overall by history and exam, recent data reviewed with pt, and pt to continue medical treatment as before,  to f/u any worsening symptoms or concerns BP Readings from Last 3 Encounters:  04/03/15 132/78  03/29/15 154/80  11/27/14 168/90

## 2015-04-05 NOTE — Assessment & Plan Note (Signed)
stable overall by history and exam, recent data reviewed with pt, and pt to continue medical treatment as before,  to f/u any worsening symptoms or concerns Lab Results  Component Value Date   HGBA1C 6.2 04/04/2015

## 2015-04-05 NOTE — Assessment & Plan Note (Signed)
With left sciatica flare, for pain control, predpac asd,  to f/u any worsening symptoms or concerns\

## 2015-04-06 ENCOUNTER — Other Ambulatory Visit: Payer: Self-pay | Admitting: Cardiovascular Disease

## 2015-04-07 ENCOUNTER — Telehealth: Payer: Self-pay

## 2015-04-07 ENCOUNTER — Emergency Department: Payer: Commercial Managed Care - HMO

## 2015-04-07 ENCOUNTER — Observation Stay: Payer: Commercial Managed Care - HMO

## 2015-04-07 ENCOUNTER — Observation Stay
Admission: EM | Admit: 2015-04-07 | Discharge: 2015-04-08 | Disposition: A | Discharge status: Home / Self Care | Payer: Commercial Managed Care - HMO | Attending: Internal Medicine | Admitting: Internal Medicine

## 2015-04-07 ENCOUNTER — Encounter: Payer: Self-pay | Admitting: Emergency Medicine

## 2015-04-07 ENCOUNTER — Observation Stay (HOSPITAL_BASED_OUTPATIENT_CLINIC_OR_DEPARTMENT_OTHER)
Admit: 2015-04-07 | Discharge: 2015-04-07 | Disposition: A | Discharge status: Home / Self Care | Payer: Commercial Managed Care - HMO | Attending: Internal Medicine | Admitting: Internal Medicine

## 2015-04-07 DIAGNOSIS — Z791 Long term (current) use of non-steroidal anti-inflammatories (NSAID): Secondary | ICD-10-CM | POA: Diagnosis not present

## 2015-04-07 DIAGNOSIS — I639 Cerebral infarction, unspecified: Secondary | ICD-10-CM | POA: Diagnosis not present

## 2015-04-07 DIAGNOSIS — Z6832 Body mass index (BMI) 32.0-32.9, adult: Secondary | ICD-10-CM | POA: Insufficient documentation

## 2015-04-07 DIAGNOSIS — R350 Frequency of micturition: Secondary | ICD-10-CM | POA: Diagnosis not present

## 2015-04-07 DIAGNOSIS — Z7982 Long term (current) use of aspirin: Secondary | ICD-10-CM | POA: Insufficient documentation

## 2015-04-07 DIAGNOSIS — E669 Obesity, unspecified: Secondary | ICD-10-CM | POA: Diagnosis not present

## 2015-04-07 DIAGNOSIS — I10 Essential (primary) hypertension: Secondary | ICD-10-CM | POA: Diagnosis not present

## 2015-04-07 DIAGNOSIS — E119 Type 2 diabetes mellitus without complications: Secondary | ICD-10-CM | POA: Diagnosis not present

## 2015-04-07 DIAGNOSIS — I638 Other cerebral infarction: Principal | ICD-10-CM | POA: Insufficient documentation

## 2015-04-07 DIAGNOSIS — R4781 Slurred speech: Secondary | ICD-10-CM | POA: Insufficient documentation

## 2015-04-07 DIAGNOSIS — E785 Hyperlipidemia, unspecified: Secondary | ICD-10-CM | POA: Diagnosis not present

## 2015-04-07 DIAGNOSIS — Z7902 Long term (current) use of antithrombotics/antiplatelets: Secondary | ICD-10-CM | POA: Diagnosis not present

## 2015-04-07 DIAGNOSIS — R1310 Dysphagia, unspecified: Secondary | ICD-10-CM | POA: Diagnosis not present

## 2015-04-07 DIAGNOSIS — N400 Enlarged prostate without lower urinary tract symptoms: Secondary | ICD-10-CM | POA: Diagnosis not present

## 2015-04-07 DIAGNOSIS — J449 Chronic obstructive pulmonary disease, unspecified: Secondary | ICD-10-CM | POA: Diagnosis not present

## 2015-04-07 DIAGNOSIS — R531 Weakness: Secondary | ICD-10-CM | POA: Diagnosis not present

## 2015-04-07 DIAGNOSIS — R29810 Facial weakness: Secondary | ICD-10-CM | POA: Diagnosis not present

## 2015-04-07 DIAGNOSIS — Z8679 Personal history of other diseases of the circulatory system: Secondary | ICD-10-CM | POA: Diagnosis not present

## 2015-04-07 DIAGNOSIS — I2510 Atherosclerotic heart disease of native coronary artery without angina pectoris: Secondary | ICD-10-CM | POA: Insufficient documentation

## 2015-04-07 DIAGNOSIS — E876 Hypokalemia: Secondary | ICD-10-CM | POA: Insufficient documentation

## 2015-04-07 LAB — CBC
HCT: 37.8 % — ABNORMAL LOW (ref 40.0–52.0)
Hemoglobin: 13 g/dL (ref 13.0–18.0)
MCH: 30.9 pg (ref 26.0–34.0)
MCHC: 34.4 g/dL (ref 32.0–36.0)
MCV: 89.9 fL (ref 80.0–100.0)
PLATELETS: 216 10*3/uL (ref 150–440)
RBC: 4.21 MIL/uL — AB (ref 4.40–5.90)
RDW: 14.4 % (ref 11.5–14.5)
WBC: 7.3 10*3/uL (ref 3.8–10.6)

## 2015-04-07 LAB — URINALYSIS COMPLETE WITH MICROSCOPIC (ARMC ONLY)
Bacteria, UA: NONE SEEN
Bilirubin Urine: NEGATIVE
Glucose, UA: NEGATIVE mg/dL
HGB URINE DIPSTICK: NEGATIVE
KETONES UR: NEGATIVE mg/dL
Leukocytes, UA: NEGATIVE
Nitrite: NEGATIVE
PROTEIN: NEGATIVE mg/dL
SPECIFIC GRAVITY, URINE: 1.012 (ref 1.005–1.030)
pH: 6 (ref 5.0–8.0)

## 2015-04-07 LAB — GLUCOSE, CAPILLARY: Glucose-Capillary: 102 mg/dL — ABNORMAL HIGH (ref 65–99)

## 2015-04-07 LAB — BASIC METABOLIC PANEL
Anion gap: 9 (ref 5–15)
BUN: 17 mg/dL (ref 6–20)
CO2: 29 mmol/L (ref 22–32)
Calcium: 9.3 mg/dL (ref 8.9–10.3)
Chloride: 103 mmol/L (ref 101–111)
Creatinine, Ser: 1.07 mg/dL (ref 0.61–1.24)
GFR calc Af Amer: >60 mL/min (ref >60)
Glucose, Bld: 112 mg/dL — ABNORMAL HIGH (ref 65–99)
Potassium: 3.3 mmol/L — ABNORMAL LOW (ref 3.5–5.1)
Sodium: 141 mmol/L (ref 135–145)

## 2015-04-07 LAB — TROPONIN I: Troponin I: <0.03 ng/mL (ref <0.031)

## 2015-04-07 MED ORDER — AMLODIPINE BESYLATE 10 MG PO TABS
10.0000 mg | ORAL_TABLET | Freq: Every day | ORAL | Status: DC
  Administered 2015-04-08: 10 mg via ORAL
  Filled 2015-04-07: qty 1

## 2015-04-07 MED ORDER — SODIUM CHLORIDE 0.9 % IJ SOLN
3.0000 mL | Freq: Two times a day (BID) | INTRAMUSCULAR | Status: DC
  Administered 2015-04-07 (×2): 3 mL via INTRAVENOUS

## 2015-04-07 MED ORDER — LISINOPRIL-HYDROCHLOROTHIAZIDE 10-12.5 MG PO TABS: 1.0000 | ORAL_TABLET | Freq: Two times a day (BID) | ORAL | Status: DC

## 2015-04-07 MED ORDER — ASPIRIN EC 81 MG PO TBEC
81.0000 mg | DELAYED_RELEASE_TABLET | Freq: Every day | ORAL | Status: DC
  Administered 2015-04-08: 81 mg via ORAL
  Filled 2015-04-07: qty 1

## 2015-04-07 MED ORDER — POTASSIUM CHLORIDE CRYS ER 20 MEQ PO TBCR
20.0000 meq | EXTENDED_RELEASE_TABLET | Freq: Every day | ORAL | Status: DC
  Administered 2015-04-08: 20 meq via ORAL
  Filled 2015-04-07: qty 1

## 2015-04-07 MED ORDER — HYDROCODONE-ACETAMINOPHEN 5-325 MG PO TABS: 1.0000 | ORAL_TABLET | Freq: Four times a day (QID) | ORAL | Status: DC | PRN

## 2015-04-07 MED ORDER — HYDROCHLOROTHIAZIDE 12.5 MG PO CAPS
12.5000 mg | ORAL_CAPSULE | Freq: Two times a day (BID) | ORAL | Status: DC
  Administered 2015-04-08: 12.5 mg via ORAL
  Filled 2015-04-07: qty 1

## 2015-04-07 MED ORDER — COLCHICINE 0.6 MG PO TABS
0.6000 mg | ORAL_TABLET | Freq: Every day | ORAL | Status: DC
  Administered 2015-04-08: 0.6 mg via ORAL
  Filled 2015-04-07: qty 1

## 2015-04-07 MED ORDER — ACETAMINOPHEN 325 MG PO TABS: 650.0000 mg | ORAL_TABLET | Freq: Four times a day (QID) | ORAL | Status: DC | PRN

## 2015-04-07 MED ORDER — ENOXAPARIN SODIUM 40 MG/0.4ML ~~LOC~~ SOLN
40.0000 mg | SUBCUTANEOUS | Status: DC
  Administered 2015-04-07: 40 mg via SUBCUTANEOUS
  Filled 2015-04-07: qty 0.4

## 2015-04-07 MED ORDER — LORATADINE 10 MG PO TABS
10.0000 mg | ORAL_TABLET | Freq: Every day | ORAL | Status: DC
  Administered 2015-04-08: 10 mg via ORAL
  Filled 2015-04-07: qty 1

## 2015-04-07 MED ORDER — CLOPIDOGREL BISULFATE 75 MG PO TABS
75.0000 mg | ORAL_TABLET | Freq: Every day | ORAL | Status: DC
  Administered 2015-04-08: 75 mg via ORAL
  Filled 2015-04-07: qty 1

## 2015-04-07 MED ORDER — TIOTROPIUM BROMIDE MONOHYDRATE 18 MCG IN CAPS
18.0000 ug | ORAL_CAPSULE | Freq: Every day | RESPIRATORY_TRACT | Status: DC
  Administered 2015-04-08: 18 ug via RESPIRATORY_TRACT
  Filled 2015-04-07: qty 5

## 2015-04-07 MED ORDER — ACETAMINOPHEN 650 MG RE SUPP: 650.0000 mg | Freq: Four times a day (QID) | RECTAL | Status: DC | PRN

## 2015-04-07 MED ORDER — ROSUVASTATIN CALCIUM 10 MG PO TABS
10.0000 mg | ORAL_TABLET | Freq: Every day | ORAL | Status: DC
  Administered 2015-04-07: 10 mg via ORAL
  Filled 2015-04-07 (×2): qty 1

## 2015-04-07 MED ORDER — FLUTICASONE PROPIONATE 50 MCG/ACT NA SUSP
2.0000 | Freq: Every day | NASAL | Status: DC
  Administered 2015-04-08: 2 via NASAL
  Filled 2015-04-07: qty 16

## 2015-04-07 MED ORDER — CARVEDILOL 12.5 MG PO TABS
12.5000 mg | ORAL_TABLET | Freq: Two times a day (BID) | ORAL | Status: DC
  Administered 2015-04-07 – 2015-04-08 (×2): 12.5 mg via ORAL
  Filled 2015-04-07 (×2): qty 1

## 2015-04-07 MED ORDER — LISINOPRIL 10 MG PO TABS
10.0000 mg | ORAL_TABLET | Freq: Two times a day (BID) | ORAL | Status: DC
  Administered 2015-04-08: 10 mg via ORAL
  Filled 2015-04-07: qty 1

## 2015-04-07 MED ORDER — PANTOPRAZOLE SODIUM 40 MG PO TBEC
40.0000 mg | DELAYED_RELEASE_TABLET | Freq: Every day | ORAL | Status: DC
  Administered 2015-04-08: 40 mg via ORAL
  Filled 2015-04-07: qty 1

## 2015-04-07 NOTE — H&P (Signed)
[facility] NAME: Travis Carey    MR#:  [id number]  DATE OF BIRTH:  Mar 29, 1941  DATE OF ADMISSION:  04/07/2015  PRIMARY CARE PHYSICIAN: Cathlean Cower, MD   REQUESTING/REFERRING PHYSICIAN: Lisa Roca, M.D.  CHIEF COMPLAINT:   Chief Complaint  Patient presents with  . Aphasia    HISTORY OF PRESENT ILLNESS:  Travis Carey  is a 74 y.o. male with a known history of hypertension and coronary artery disease with sinus issues. He saw his PMD on Thursday and was given a tapering dose of prednisone for some sinus issues as per the patient. On Friday he started having slurred speech and is swallowing a slower. He feels that his strength has been okay. He feels tired. His symptoms have been coming and going since Friday area and he knows what he wants to say but can't get it out right. He's had some tingling on the right hand and wrist but that's been going on for a while. In the ER, the ER physician was concerned about possible stroke and hospitalist services were contacted for further evaluation.  PAST MEDICAL HISTORY:   Past Medical History  Diagnosis Date  . VITAMIN B12 DEFICIENCY 02/09/2010  . GLUCOSE INTOLERANCE 01/28/2010  . HYPERLIPIDEMIA 02/07/2008  . HYPOKALEMIA 01/28/2010  . ANEMIA-NOS 01/28/2010  . ERECTILE DYSFUNCTION 02/24/2009  . DEPRESSION 06/12/2007  . Carpal tunnel syndrome 05/20/2008  . HYPERTENSION 06/09/2007  . CAD 02/17/2010  . HEMORRHOIDS 06/09/2007  . URI 08/19/2008  . GERD 06/09/2007  . SHOULDER PAIN, LEFT 04/25/2008  . LOW BACK PAIN 06/12/2007  . PARESTHESIA 04/25/2008  . CAROTID BRUIT 11/18/2009  . DYSPNEA 07/28/2010  . CHEST PAIN 09/26/2009  . MYOCARDIAL PERFUSION SCAN, WITH STRESS TEST, ABNORMAL 10/08/2009  . Impaired glucose tolerance 01/23/2011  . Cervical radiculitis 04/17/2012  . Allergic rhinitis, cause unspecified 01/31/2013  . BPH (benign prostatic hypertrophy) 04/25/2013  . GERD (gastroesophageal reflux disease)      PAST SURGICAL HISTORY:   Past Surgical History  Procedure Laterality Date  . Coronary stent placement      from right radial  . Colonoscopy      SOCIAL HISTORY:   History  Substance Use Topics  . Smoking status: Never Smoker   . Smokeless tobacco: Never Used  . Alcohol Use: No    FAMILY HISTORY:   Family History  Problem Relation Age of Onset  . Alcohol abuse Sister     ETOH dependence  . Alcohol abuse Brother     ETOH  dependence  . Heart attack Other   . Stroke Other   . Hypertension Other   . Coronary artery disease Other   . Hyperlipidemia Mother   . Heart disease Mother   . Hypertension Mother   . Hyperlipidemia Father   . Heart disease Father   . Hypertension Father     DRUG ALLERGIES:   Allergies  Allergen Reactions  . Diclofenac Sodium     REACTION: maks pt drowsey    REVIEW OF SYSTEMS:  CONSTITUTIONAL: No fever, positive for fatigue  EYES: No blurred or double vision.  EARS, NOSE, AND THROAT: No tinnitus or ear pain. No sore throat. Slow with his swallowing. RESPIRATORY: No cough, shortness of breath, wheezing or hemoptysis.  CARDIOVASCULAR: No chest pain, orthopnea, edema.  GASTROINTESTINAL: No nausea, vomiting, diarrhea or abdominal pain. No blood in bowel movements. Occasional constipation GENITOURINARY: No dysuria, hematuria.  ENDOCRINE: No polyuria, nocturia,  HEMATOLOGY: No anemia,  easy bruising or bleeding SKIN: No rash or lesion. MUSCULOSKELETAL: Occasional joint pain  NEUROLOGIC: Some tingling and numbness right hand, slurred speech and slowing of his swallowing.  PSYCHIATRY: No anxiety or depression.   MEDICATIONS AT HOME:   Prior to Admission medications   Medication Sig Start Date End Date Taking? Authorizing Provider  amLODipine (NORVASC) 10 MG tablet Take 1 tablet (10 mg total) by mouth daily. 02/07/15  Yes Minna Merritts, MD  aspirin EC 81 MG tablet Take 81 mg by mouth daily.   Yes Historical Provider, MD  carvedilol  (COREG) 12.5 MG tablet Take 1 tablet (12.5 mg total) by mouth 2 (two) times daily. 02/24/15  Yes Biagio Borg, MD  clopidogrel (PLAVIX) 75 MG tablet TAKE ONE TABLET BY MOUTH ONCE DAILY 04/01/15  Yes Biagio Borg, MD  colchicine 0.6 MG tablet Take 1 tablet (0.6 mg total) by mouth daily. 11/01/13  Yes Biagio Borg, MD  dexlansoprazole (DEXILANT) 60 MG capsule Take 1 capsule (60 mg total) by mouth daily. 01/31/13  Yes Biagio Borg, MD  fexofenadine (ALLEGRA) 180 MG tablet Take 1 tablet (180 mg total) by mouth daily. As needed Patient taking differently: Take 180 mg by mouth daily as needed for allergies.  06/12/14  Yes Biagio Borg, MD  fluticasone (FLONASE) 50 MCG/ACT nasal spray Place 2 sprays into both nostrils daily. 06/12/14  Yes Biagio Borg, MD  HYDROcodone-acetaminophen (NORCO/VICODIN) 5-325 MG per tablet Take 1 tablet by mouth every 6 (six) hours as needed for moderate pain. 04/03/15  Yes Biagio Borg, MD  lisinopril-hydrochlorothiazide (PRINZIDE,ZESTORETIC) 10-12.5 MG per tablet Take 1 tablet by mouth 2 (two) times daily.   Yes Historical Provider, MD  meloxicam (MOBIC) 7.5 MG tablet TAKE ONE TO TWO TABLETS BY MOUTH ONCE DAILY AS NEEDED FOR PAIN 11/07/14  Yes Biagio Borg, MD  potassium chloride (K-DUR) 10 MEQ tablet TAKE TWO TABLETS BY MOUTH ONCE DAILY 10/03/14  Yes Biagio Borg, MD  CRESTOR 10 MG tablet TAKE ONE TABLET BY MOUTH ONCE DAILY 12/02/14   Minna Merritts, MD      VITAL SIGNS:  Blood pressure 169/78, pulse 65, temperature 98.3 F (36.8 C), temperature source Oral, resp. rate 20, height 5\' 8"  (1.727 m), weight 96.616 kg (213 lb), SpO2 100 %.  PHYSICAL EXAMINATION:  GENERAL:  74 y.o.-year-old patient lying in the bed with no acute distress.  EYES: Pupils equal, round, reactive to light and accommodation. No scleral icterus. Extraocular muscles intact.  HEENT: Head atraumatic, normocephalic. Oropharynx and nasopharynx clear. No vesicles seen in the ear or around the ear. NECK:  Supple,  no jugular venous distention. No thyroid enlargement, no tenderness.  LUNGS: Normal breath sounds bilaterally, no wheezing, rales,rhonchi or crepitation. No use of accessory muscles of respiration.  CARDIOVASCULAR: S1, S2 normal. No murmurs, rubs, or gallops.  ABDOMEN: Soft, nontender, nondistended. Bowel sounds present. No organomegaly or mass.  EXTREMITIES: Trace edema, no cyanosis, or clubbing.  NEUROLOGIC: Babinski upgoing right foot. Babinski negative left foot. Power 5 out of 5 on bilateral upper and lower extremities. Patient does have slurred speech. Tongue deviation to the right. Right facial droop with sparing of the  forehead. Patient is able to close his eyes without a problem. PSYCHIATRIC: The patient is alert and oriented x 3.  SKIN: No rash, lesion, or ulcer.   LABORATORY PANEL:   CBC  Recent Labs Lab 04/07/15 1026  WBC 7.3  HGB 13.0  HCT 37.8*  PLT  [facility] 04/07/15 1026  NA 141  K 3.3*  CL 103  CO2 29  GLUCOSE 112*  BUN 17  CREATININE 1.07  CALCIUM 9.3   Cardiac Enzymes  Recent Labs Lab 04/07/15 1026  TROPONINI <0.03    RADIOLOGY:  Ct Head Wo Contrast  04/07/2015   CLINICAL DATA:  Development of slurred speech and difficulty swallowing 2 days ago. Some right-sided weakness.  EXAM: CT HEAD WITHOUT CONTRAST  TECHNIQUE: Contiguous axial images were obtained from the base of the skull through the vertex without intravenous contrast.  COMPARISON:  None.  FINDINGS: The brain does not show accelerated atrophy. There chronic appearing small vessel ischemic changes affecting the cerebral hemispheric white matter bilaterally. There are areas of low density in the left basal ganglia and radiating white matter tracts consistent with small vessel infarctions. These are most likely chronic based on the CT, but some part of this could represent a more recent ischemic insult. No large vessel territory or cortical stroke is seen. No mass lesion,  hemorrhage, hydrocephalus or extra-axial collection. The calvarium is unremarkable. Sinuses do not show inflammatory change. There is atherosclerotic calcification of the major vessels at the base of the brain.  IMPRESSION: Chronic small-vessel ischemic changes of the cerebral hemispheric white matter. 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 There could be a more recent small vessel infarction in that region that could be hidden amongst the likely more chronic insults.   Electronically Signed   By: Nelson Chimes M.D.   On: 04/07/2015 10:44    EKG:   Normal sinus rhythm, right bundle branch block, left anterior fascicular block, left ventricular hypertrophy, septal Q waves.  IMPRESSION AND PLAN:   1. Suspected stroke versus atypical Bell's palsy. Patient has right facial droop, tongue deviation to the right and slurred speech and slowed swallowing. I will get the stroke workup including an MRI of the brain, carotid ultrasound, echocardiogram. I will get speech therapy consultation. I will send off a Lyme titer. Can consider high-dose prednisone and anti-viral treatment if stroke workup is negative. Patient is already on aspirin, Plavix and Crestor. 2. Essential hypertension- blood pressure is slightly high today I will allow that with possible stroke. Continue usual medications. 3. Hyperlipidemia unspecified continue Crestor check a lipid profile in the a.m. 4. Hypokalemia- we'll replace potassium today. 5. Gastroesophageal reflux disease without esophagitis continue PPI. 6. Numbness right hand likely secondary to carpal tunnel history.  All the records are reviewed and case discussed with ED provider. Management plans discussed with the patient, family and they are in agreement.  CODE STATUS: Full code  TOTAL TIME TAKING CARE OF THIS PATIENT: 55 minutes.    Loletha Grayer M.D on 04/07/2015 at 3:29 PM  Between 7am to 6pm - Pager -  [phone]  After 6pm call admission pager [facility]  Office  [phone]  CC: Primary care physician; Cathlean Cower, MD

## 2015-04-07 NOTE — Progress Notes (Signed)
Skin vertified by Alfonse Flavors RN. MD weiting was made aware of pt positive MRI results.

## 2015-04-07 NOTE — ED Provider Notes (Signed)
[facility] Emergency Department Provider Note   ____________________________________________  Time seen: On arrival to ER room I have reviewed the triage vital signs and the triage nursing note.  HISTORY  Chief Complaint Aphasia   Historian Patient  HPI Travis Carey is a 74 y.o. male who states that either Thursday or Friday night he started having trouble with speech. He continued having slurred speech throughout the weekend and his wife told him he should come get checked out this morning. He also has had trouble swallowing. He's not had a stroke that he knows of in the past. He denies any chest pain or shortness of breath. He does have a history of a stent and is on Plavix and aspirin. No headache. No fever.Symptoms are moderate    Past Medical History  Diagnosis Date  . VITAMIN B12 DEFICIENCY 02/09/2010  . GLUCOSE INTOLERANCE 01/28/2010  . HYPERLIPIDEMIA 02/07/2008  . HYPOKALEMIA 01/28/2010  . ANEMIA-NOS 01/28/2010  . ERECTILE DYSFUNCTION 02/24/2009  . DEPRESSION 06/12/2007  . Carpal tunnel syndrome 05/20/2008  . HYPERTENSION 06/09/2007  . CAD 02/17/2010  . HEMORRHOIDS 06/09/2007  . URI 08/19/2008  . GERD 06/09/2007  . SHOULDER PAIN, LEFT 04/25/2008  . LOW BACK PAIN 06/12/2007  . PARESTHESIA 04/25/2008  . CAROTID BRUIT 11/18/2009  . DYSPNEA 07/28/2010  . CHEST PAIN 09/26/2009  . MYOCARDIAL PERFUSION SCAN, WITH STRESS TEST, ABNORMAL 10/08/2009  . Impaired glucose tolerance 01/23/2011  . Cervical radiculitis 04/17/2012  . Allergic rhinitis, cause unspecified 01/31/2013  . BPH (benign prostatic hypertrophy) 04/25/2013  . GERD (gastroesophageal reflux disease)     Patient Active Problem List   Diagnosis Date Noted  . Rhinitis, allergic 03/29/2015  . Urinary frequency 03/29/2015  . Diabetes type 2, uncontrolled 03/29/2015  . Right arm pain 11/27/2014  . Epididymal cyst 06/12/2014  . Left wrist pain 11/01/2013  . Right wrist pain 11/01/2013  . Nocturia  11/01/2013  . Routine general medical examination at a health care facility 11/01/2013  . Obese 09/12/2013  . BPH (benign prostatic hypertrophy) 04/25/2013  . Knee pain, bilateral 04/25/2013  . Chest pain 01/31/2013  . Eustachian tube dysfunction 01/31/2013  . Cough 01/31/2013  . Allergic rhinitis 01/31/2013  . Cervical radiculitis 04/17/2012  . Cervical disc disease 04/17/2012  . Paresthesia of arm 12/08/2011  . Hypertension 12/08/2011  . Myalgia 11/05/2011  . B12 deficiency 11/05/2011  . Fatigue 07/27/2011  . COPD (chronic obstructive pulmonary disease) 01/26/2011  . Left knee pain 01/26/2011  . Impaired glucose tolerance 01/23/2011  . CAD 02/17/2010  . VITAMIN B12 DEFICIENCY 02/09/2010  . ANEMIA-NOS 01/28/2010  . CAROTID BRUIT 11/18/2009  . ERECTILE DYSFUNCTION 02/24/2009  . Carpal tunnel syndrome 05/20/2008  . PARESTHESIA 04/25/2008  . HYPERLIPIDEMIA 02/07/2008  . DEPRESSION 06/12/2007  . LOW BACK PAIN 06/12/2007  . GERD 06/09/2007    Past Surgical History  Procedure Laterality Date  . Coronary stent placement      from right radial  . Colonoscopy      Current Outpatient Rx  Name  Route  Sig  Dispense  Refill  . amLODipine (NORVASC) 10 MG tablet   Oral   Take 1 tablet (10 mg total) by mouth daily.   30 tablet   3   . aspirin EC 81 MG tablet   Oral   Take 81 mg by mouth daily.         . carvedilol (COREG) 12.5 MG tablet   Oral   Take 1 tablet (12.5 mg total)  by mouth 2 (two) times daily.   180 tablet   3   . clopidogrel (PLAVIX) 75 MG tablet      TAKE ONE TABLET BY MOUTH ONCE DAILY   90 tablet   1   . colchicine 0.6 MG tablet   Oral   Take 1 tablet (0.6 mg total) by mouth daily.   90 tablet   3   . dexlansoprazole (DEXILANT) 60 MG capsule   Oral   Take 1 capsule (60 mg total) by mouth daily.   90 capsule   3   . fexofenadine (ALLEGRA) 180 MG tablet   Oral   Take 1 tablet (180 mg total) by mouth daily. As needed Patient taking  differently: Take 180 mg by mouth daily as needed for allergies.    90 tablet   3   . fluticasone (FLONASE) 50 MCG/ACT nasal spray   Each Nare   Place 2 sprays into both nostrils daily.   16 g   5   . HYDROcodone-acetaminophen (NORCO/VICODIN) 5-325 MG per tablet   Oral   Take 1 tablet by mouth every 6 (six) hours as needed for moderate pain.   60 tablet   0   . lisinopril-hydrochlorothiazide (PRINZIDE,ZESTORETIC) 10-12.5 MG per tablet   Oral   Take 1 tablet by mouth 2 (two) times daily.         . meloxicam (MOBIC) 7.5 MG tablet      TAKE ONE TO TWO TABLETS BY MOUTH ONCE DAILY AS NEEDED FOR PAIN   60 tablet   0   . potassium chloride (K-DUR) 10 MEQ tablet      TAKE TWO TABLETS BY MOUTH ONCE DAILY   180 tablet   3   . CRESTOR 10 MG tablet      TAKE ONE TABLET BY MOUTH ONCE DAILY   30 tablet   3     Allergies Diclofenac sodium  Family History  Problem Relation Age of Onset  . Alcohol abuse Sister     ETOH dependence  . Alcohol abuse Brother     ETOH  dependence  . Heart attack Other   . Stroke Other   . Hypertension Other   . Coronary artery disease Other   . Hyperlipidemia Mother   . Heart disease Mother   . Hypertension Mother   . Hyperlipidemia Father   . Heart disease Father   . Hypertension Father     Social History History  Substance Use Topics  . Smoking status: Never Smoker   . Smokeless tobacco: Never Used  . Alcohol Use: No    Review of Systems  Constitutional: Negative for fever. Eyes: Negative for visual changes. ENT: Negative for sore throat. Cardiovascular: Negative for chest pain. Respiratory: Negative for shortness of breath. Gastrointestinal: Negative for abdominal pain, vomiting and diarrhea. Genitourinary: Negative for dysuria. Musculoskeletal: Negative for back pain. Skin: Negative for rash. Neurological: Negative for headache. 10 point Review of Systems otherwise  negative ____________________________________________   PHYSICAL EXAM:  VITAL SIGNS: ED Triage Vitals  Enc Vitals Group     BP 04/07/15 1016 149/69 mmHg     Pulse Rate 04/07/15 1016 65     Resp 04/07/15 1016 18     Temp 04/07/15 1016 98.3 F (36.8 C)     Temp Source 04/07/15 1016 Oral     SpO2 04/07/15 1016 100 %     Weight 04/07/15 1016 213 lb (96.616 kg)     Height 04/07/15 1016 5\' 8"  (  1.727 m)     Head Cir --      Peak Flow --      Pain Score --      Pain Loc --      Pain Edu? --      Excl. in [place]? --      Constitutional: Alert and oriented. Well appearing and in no distress. Eyes: Conjunctivae are normal. PERRL. Normal extraocular movements. ENT   Head: Normocephalic and atraumatic. Right-sided facial droop   Nose: No congestion/rhinnorhea.   Mouth/Throat: Mucous membranes are moist.   Neck: No stridor. Cardiovascular/Chest: Normal rate, regular rhythm.  No murmurs, rubs, or gallops. Respiratory: Normal respiratory effort without tachypnea nor retractions. Breath sounds are clear and equal bilaterally. No wheezes/rales/rhonchi. Gastrointestinal: Soft. No distention, no guarding, no rebound. Nontender  Genitourinary/rectal:Deferred Musculoskeletal: Nontender with normal range of motion in all extremities. No joint effusions.  No lower extremity tenderness nor edema. Neurologic:  Mild slurring of speech. Right-sided facial droop. No sensory changes to the face, or extremities. 5 out of 5 strength in his 4 extremities. Tongue deviation to the right. Skin:  Skin is warm, dry and intact. No rash noted. Psychiatric: Mood and affect are normal. Speech and behavior are normal. Patient exhibits appropriate insight and judgment.  ____________________________________________   EKG I, Lisa Roca, MD, the attending physician have personally viewed and interpreted all ECGs.  62 bpm. Normal sinus rhythm. Left axis deviation. Bifascicular block. Q waves septally.  Nonspecific ST and T-wave. ____________________________________________  LABS (pertinent positives/negatives)  White blood count 7.3, hemoglobin 13.0   ____________________________________________  RADIOLOGY All Xrays were viewed by me. Imaging interpreted by Radiologist.  CT head noncontrast: IMPRESSION: Chronic small-vessel ischemic changes of the cerebral hemispheric white matter. 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 There could be a more recent small vessel infarction in that region that could be hidden amongst the likely more chronic insults. __________________________________________  PROCEDURES  Procedure(s) performed: None Critical Care performed: None  ____________________________________________   ED COURSE / ASSESSMENT AND PLAN  CONSULTATIONS: Hospitalist face-to-face for admission  Pertinent labs & imaging results that were available during my care of the patient were reviewed by me and considered in my medical decision making (see chart for details).   Patient's symptoms consistent with a stroke. Onset may have been Friday. CT is equivocal for acute versus chronic findings, however patient's symptoms are clinically significant for acute stroke.   Patient / Family / Caregiver informed of clinical course, medical decision-making process, and agree with plan.   I discussed return precautions, follow-up instructions, and discharged instructions with patient and/or family.  ___________________________________________   FINAL CLINICAL IMPRESSION(S) / ED DIAGNOSES   Final diagnoses:  Facial muscle weakness       Lisa Roca, MD 04/07/15 1444

## 2015-04-07 NOTE — Progress Notes (Signed)
*  PRELIMINARY RESULTS* Echocardiogram 2D Echocardiogram has been performed.  Travis Carey 04/07/2015, 8:00 PM

## 2015-04-07 NOTE — Progress Notes (Signed)
No neuro defeceits. Pt has not complained of any pain. Family at the bedside. A & O. Passed swallow eval. Pt has no further concerns.

## 2015-04-07 NOTE — ED Notes (Signed)
Pt reports that he developed slurred speech and difficulty swallowing two days ago. He reports that he was weak on one side but it is no longer that way. Pt was able to ambulate without difficulty.

## 2015-04-07 NOTE — Telephone Encounter (Signed)
l mom to schedule f/u with Dr./ Lissa Hoard. Letter sent

## 2015-04-07 NOTE — ED Notes (Signed)
Patient transported to MRI 

## 2015-04-08 ENCOUNTER — Observation Stay: Payer: Commercial Managed Care - HMO

## 2015-04-08 DIAGNOSIS — I639 Cerebral infarction, unspecified: Secondary | ICD-10-CM | POA: Diagnosis not present

## 2015-04-08 DIAGNOSIS — R1310 Dysphagia, unspecified: Secondary | ICD-10-CM | POA: Diagnosis not present

## 2015-04-08 DIAGNOSIS — E876 Hypokalemia: Secondary | ICD-10-CM | POA: Diagnosis not present

## 2015-04-08 DIAGNOSIS — I2510 Atherosclerotic heart disease of native coronary artery without angina pectoris: Secondary | ICD-10-CM | POA: Diagnosis not present

## 2015-04-08 DIAGNOSIS — Z8679 Personal history of other diseases of the circulatory system: Secondary | ICD-10-CM | POA: Diagnosis not present

## 2015-04-08 DIAGNOSIS — R29810 Facial weakness: Secondary | ICD-10-CM | POA: Diagnosis not present

## 2015-04-08 DIAGNOSIS — I10 Essential (primary) hypertension: Secondary | ICD-10-CM | POA: Diagnosis not present

## 2015-04-08 DIAGNOSIS — R4781 Slurred speech: Secondary | ICD-10-CM | POA: Diagnosis not present

## 2015-04-08 DIAGNOSIS — E119 Type 2 diabetes mellitus without complications: Secondary | ICD-10-CM | POA: Diagnosis not present

## 2015-04-08 DIAGNOSIS — E785 Hyperlipidemia, unspecified: Secondary | ICD-10-CM | POA: Diagnosis not present

## 2015-04-08 DIAGNOSIS — R531 Weakness: Secondary | ICD-10-CM | POA: Diagnosis not present

## 2015-04-08 DIAGNOSIS — I638 Other cerebral infarction: Secondary | ICD-10-CM | POA: Diagnosis not present

## 2015-04-08 LAB — B. BURGDORFI ANTIBODIES

## 2015-04-08 LAB — CBC
HEMATOCRIT: 38.2 % — AB (ref 40.0–52.0)
Hemoglobin: 12.9 g/dL — ABNORMAL LOW (ref 13.0–18.0)
MCH: 30.3 pg (ref 26.0–34.0)
MCHC: 33.7 g/dL (ref 32.0–36.0)
MCV: 90 fL (ref 80.0–100.0)
PLATELETS: 203 10*3/uL (ref 150–440)
RBC: 4.25 MIL/uL — ABNORMAL LOW (ref 4.40–5.90)
RDW: 14.2 % (ref 11.5–14.5)
WBC: 6 10*3/uL (ref 3.8–10.6)

## 2015-04-08 LAB — BASIC METABOLIC PANEL
Anion gap: 8 (ref 5–15)
BUN: 18 mg/dL (ref 6–20)
CALCIUM: 8.9 mg/dL (ref 8.9–10.3)
CO2: 28 mmol/L (ref 22–32)
CREATININE: 1.01 mg/dL (ref 0.61–1.24)
Chloride: 107 mmol/L (ref 101–111)
GFR calc Af Amer: >60 mL/min (ref >60)
Glucose, Bld: 106 mg/dL — ABNORMAL HIGH (ref 65–99)
Potassium: 3 mmol/L — ABNORMAL LOW (ref 3.5–5.1)
Sodium: 143 mmol/L (ref 135–145)

## 2015-04-08 LAB — LIPID PANEL
Cholesterol: 144 mg/dL (ref 0–200)
HDL: 30 mg/dL — ABNORMAL LOW (ref >40)
LDL Cholesterol: 64 mg/dL (ref 0–99)
Total CHOL/HDL Ratio: 4.8 RATIO
Triglycerides: 252 mg/dL — ABNORMAL HIGH (ref <150)
VLDL: 50 mg/dL — AB (ref 0–40)

## 2015-04-08 MED ORDER — POTASSIUM CHLORIDE 20 MEQ/15ML (10%) PO SOLN
40.0000 meq | Freq: Once | ORAL | Status: AC
  Administered 2015-04-08: 40 meq via ORAL
  Filled 2015-04-08: qty 30

## 2015-04-08 NOTE — Progress Notes (Signed)
PT Cancellation Note  Patient Details Name: JERRIN RECORE MRN: [id number] DOB: 1940/12/10   Cancelled Treatment:    Reason Eval/Treat Not Completed: Other (comment) (PT order discontinued/cancelled 04/08/15 at 0840.)  Please re-consult PT if any therapy needs are identified.   Raquel Sarna Abie Killian 04/08/2015, 9:54 AM  Leitha Bleak, [place]

## 2015-04-08 NOTE — Discharge Summary (Signed)
[facility] NAME: Travis Carey    MR#:  [id number]  DATE OF BIRTH:  26-Aug-1941  DATE OF ADMISSION:  04/07/2015 ADMITTING PHYSICIAN: Loletha Grayer, MD  DATE OF DISCHARGE: 03/31/2015 PRIMARY CARE PHYSICIAN: Cathlean Cower, MD    ADMISSION DIAGNOSIS:  Facial muscle weakness [R29.810] Stroke [I63.9]  DISCHARGE DIAGNOSIS:  Active Problems:   CVA (cerebral infarction)   SECONDARY DIAGNOSIS:   Past Medical History  Diagnosis Date  . VITAMIN B12 DEFICIENCY 02/09/2010  . GLUCOSE INTOLERANCE 01/28/2010  . HYPERLIPIDEMIA 02/07/2008  . HYPOKALEMIA 01/28/2010  . ANEMIA-NOS 01/28/2010  . ERECTILE DYSFUNCTION 02/24/2009  . DEPRESSION 06/12/2007  . Carpal tunnel syndrome 05/20/2008  . HYPERTENSION 06/09/2007  . CAD 02/17/2010  . HEMORRHOIDS 06/09/2007  . URI 08/19/2008  . GERD 06/09/2007  . SHOULDER PAIN, LEFT 04/25/2008  . LOW BACK PAIN 06/12/2007  . PARESTHESIA 04/25/2008  . CAROTID BRUIT 11/18/2009  . DYSPNEA 07/28/2010  . CHEST PAIN 09/26/2009  . MYOCARDIAL PERFUSION SCAN, WITH STRESS TEST, ABNORMAL 10/08/2009  . Impaired glucose tolerance 01/23/2011  . Cervical radiculitis 04/17/2012  . Allergic rhinitis, cause unspecified 01/31/2013  . BPH (benign prostatic hypertrophy) 04/25/2013  . GERD (gastroesophageal reflux disease)     HOSPITAL COURSE:  This is a very pleasant 74 year old male with a known history of hypertension and CAD who presented with a facial. For further details further H&P.  1. Acute nonhemorrhagic infarct involving the posterior limb of the left internal capsule: Patient was admitted for a stroke. He underwent MRI, carotid Doppler and 2-D echo exam. He does have a stroke as stated above. Patient's are reviewed and aspirin and Plavix which she will continue. Patient did not require physical therapy or speech consultation as all his symptoms have subsided. Patient will continue on his statin medication as well. LDL is at goal.  Carotid Doppler showed no evidence of hemodynamically significant stenosis. 2-D echocardiogram showed mild diastolic dysfunction with a normal ejection fraction. Patient will need close follow-up with his primary care physician.  2. Hypertension essential: Patient was started on his hypertensive medications on the hospital. Patient will continue these medications as his symptoms of his stroke at not worsened.   3. CAD: Patient continue on Norvasc, aspirin, Coreg and Plavix. Along with lisinopril, HCTZ and statin.  4. Hyperlipidemia: Patient's LDL is at goal. Patient continue statin medication.   DISCHARGE CONDITIONS AND DIET:  Patient is stable for discharge home. He will be on a heart healthy diet.  CONSULTS OBTAINED:    None DRUG ALLERGIES:   Allergies  Allergen Reactions  . Diclofenac Sodium     REACTION: maks pt drowsey    DISCHARGE MEDICATIONS:   Current Discharge Medication List    CONTINUE these medications which have NOT CHANGED   Details  amLODipine (NORVASC) 10 MG tablet Take 1 tablet (10 mg total) by mouth daily. Qty: 30 tablet, Refills: 3    aspirin EC 81 MG tablet Take 81 mg by mouth daily.    carvedilol (COREG) 12.5 MG tablet Take 1 tablet (12.5 mg total) by mouth 2 (two) times daily. Qty: 180 tablet, Refills: 3    clopidogrel (PLAVIX) 75 MG tablet TAKE ONE TABLET BY MOUTH ONCE DAILY Qty: 90 tablet, Refills: 1    colchicine 0.6 MG tablet Take 1 tablet (0.6 mg total) by mouth daily. Qty: 90 tablet, Refills: 3    dexlansoprazole (DEXILANT) 60 MG capsule Take 1 capsule (60 mg total) by mouth daily. Qty:  90 capsule, Refills: 3    fexofenadine (ALLEGRA) 180 MG tablet Take 1 tablet (180 mg total) by mouth daily. As needed Qty: 90 tablet, Refills: 3    fluticasone (FLONASE) 50 MCG/ACT nasal spray Place 2 sprays into both nostrils daily. Qty: 16 g, Refills: 5    HYDROcodone-acetaminophen (NORCO/VICODIN) 5-325 MG per tablet Take 1 tablet by mouth every 6 (six)  hours as needed for moderate pain. Qty: 60 tablet, Refills: 0    lisinopril-hydrochlorothiazide (PRINZIDE,ZESTORETIC) 10-12.5 MG per tablet Take 1 tablet by mouth 2 (two) times daily.    meloxicam (MOBIC) 7.5 MG tablet TAKE ONE TO TWO TABLETS BY MOUTH ONCE DAILY AS NEEDED FOR PAIN Qty: 60 tablet, Refills: 0    potassium chloride (K-DUR) 10 MEQ tablet TAKE TWO TABLETS BY MOUTH ONCE DAILY Qty: 180 tablet, Refills: 3    CRESTOR 10 MG tablet TAKE ONE TABLET BY MOUTH ONCE DAILY Qty: 30 tablet, Refills: 3      STOP taking these medications     tiotropium (SPIRIVA) 18 MCG inhalation capsule               Today   CHIEF COMPLAINT:  Patient has no issues this morning. Patient is doing quite well. Patient is ambulating without any issues. Patient has no numbness or aphasia. Patient is tolerating his diet.   VITAL SIGNS:  Blood pressure 169/74, pulse 67, temperature 98.1 F (36.7 C), temperature source Oral, resp. rate 25, height 5\' 8"  (1.727 m), weight 96.21 kg (212 lb 1.7 oz), SpO2 97 %.   REVIEW OF SYSTEMS:  Review of Systems  Constitutional: Negative for fever, chills and malaise/fatigue.  HENT: Negative for sore throat.   Eyes: Negative for blurred vision.  Respiratory: Negative for cough, hemoptysis, shortness of breath and wheezing.   Cardiovascular: Negative for chest pain, palpitations and leg swelling.  Gastrointestinal: Negative for nausea, vomiting, abdominal pain, diarrhea and blood in stool.  Genitourinary: Negative for dysuria.  Musculoskeletal: Negative for back pain.  Neurological: Negative for dizziness, tremors and headaches.  Endo/Heme/Allergies: Does not bruise/bleed easily.     PHYSICAL EXAMINATION:  GENERAL:  74 y.o.-year-old patient sitting in a chair eating his breakfast with no acute distress  NECK:  Supple, no jugular venous distention. No thyroid enlargement, no tenderness.  LUNGS: Normal breath sounds bilaterally, no wheezing, rales,rhonchi   No use of accessory muscles of respiration.  CARDIOVASCULAR: S1, S2 normal. No murmurs, rubs, or gallops.  ABDOMEN: Soft, non-tender, non-distended. Bowel sounds present. No organomegaly or mass.  EXTREMITIES: No pedal edema, cyanosis, or clubbing.  PSYCHIATRIC: The patient is alert and oriented x 3.  SKIN: No obvious rash, lesion, or ulcer.   DATA REVIEW:   CBC  Recent Labs Lab 04/08/15 0414  WBC 6.0  HGB 12.9*  HCT 38.2*  PLT 203    Chemistries   Recent Labs Lab 04/08/15 0414  NA 143  K 3.0*  CL 107  CO2 28  GLUCOSE 106*  BUN 18  CREATININE 1.01  CALCIUM 8.9    Cardiac Enzymes  Recent Labs Lab 04/07/15 1026  TROPONINI <0.03    Microbiology Results  @MICRORSLT48 @  RADIOLOGY:  Ct Head Wo Contrast  04/07/2015   CLINICAL DATA:  Development of slurred speech and difficulty swallowing 2 days ago. Some right-sided weakness.  EXAM: CT HEAD WITHOUT CONTRAST  TECHNIQUE: Contiguous axial images were obtained from the base of the skull through the vertex without intravenous contrast.  COMPARISON:  None.  FINDINGS: The brain does not  show accelerated atrophy. There chronic appearing small vessel ischemic changes affecting the cerebral hemispheric white matter bilaterally. There are areas of low density in the left basal ganglia and radiating white matter tracts consistent with small vessel infarctions. These are most likely chronic based on the CT, but some part of this could represent a more recent ischemic insult. No large vessel territory or cortical stroke is seen. No mass lesion, hemorrhage, hydrocephalus or extra-axial collection. The calvarium is unremarkable. Sinuses do not show inflammatory change. There is atherosclerotic calcification of the major vessels at the base of the brain.  IMPRESSION: Chronic small-vessel ischemic changes of the cerebral hemispheric white matter. 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 There could be  a more recent small vessel infarction in that region that could be hidden amongst the likely more chronic insults.   Electronically Signed   By: Nelson Chimes M.D.   On: 04/07/2015 10:44   Mr Brain Wo Contrast IMPRESSION: 1. Acute nonhemorrhagic infarct involving the posterior limb of the left internal capsule. 2. More remote lacunar infarcts of the left lentiform nucleus. 3. Age advanced atrophy and moderate diffuse white matter disease. This likely reflects the sequela of chronic microvascular ischemia. These results were called by telephone at the time of interpretation on 04/07/2015 at 4:45 pm to Dr. Loletha Grayer , who verbally acknowledged these results.   Electronically Signed   By: San Morelle M.D.   On: 04/07/2015 16:45   US Carotid Bilateral    IMPRESSION: 1. Bilateral intimal medial thickening without significant atherosclerotic plaque or evidence of stenosis. 2. Vertebral arteries are patent with normal antegrade flow.  Signed,  Criselda Peaches, MD  [facility]  [facility]   Electronically Signed   By: Jacqulynn Cadet M.D.   On: 04/08/2015 10:31    Echocardiogram: Normal ejection fraction with diastolic dysfunction.  Management plans discussed with the patient and he is in agreement. Stable for discharge home  Patient should follow up with PCP in one week  CODE STATUS:     Code Status Orders        Start     Ordered   04/07/15 1518  Full code   Continuous     04/07/15 1518      TOTAL TIME TAKING CARE OF THIS PATIENT: 35 minutes.    Drina Jobst M.D on 04/08/2015 at 10:48 AM  Between 7am to 6pm - Pager - [phone] After 6pm go to [email or web address] - password [email or web address] [facility]  [facility]  Office  [phone]  CC: Primary care physician; Cathlean Cower, MD

## 2015-04-09 ENCOUNTER — Telehealth: Payer: Self-pay

## 2015-04-09 NOTE — Telephone Encounter (Signed)
Patient admitted for stroke. TCM hosp follow up for July 6th, 2016 at 3:15 pm.   Transition Care Management Follow-up Telephone Call  How have you been since you were released from the hospital? Feeling well  Do you understand why you were in the hospital? Yes, pt states that he understands.   Do you understand the discharge instrcutions? Yes, pt states that he understands.   Where were you discharged to? Home   Items Reviewed:  Medications reviewed: medications reviewed.   Allergies reviewed: no new drug allergies  Dietary changes reviewed: heart healthy diet  Referrals reviewed: no referrals made at this time.    Functional Questionnaire:   Activities of Daily Living (ADLs):   He states they are independent in the following: pt is able to live independently. Pt states that they have been exercising as instructed.  States they require assistance with the following: assistance not needed at this time.   Any transportation issues/concerns?: no issues.   Any patient concerns? Pt does not have any concerns at this time.   Confirmed importance and date/time of follow-up visits scheduled: pt knows date and time of appt  Confirmed with patient if condition begins to worsen call PCP or go to the ER.  Patient was given the office number and encouraged to call back with question or concerns: pt understands.

## 2015-04-16 ENCOUNTER — Ambulatory Visit (INDEPENDENT_AMBULATORY_CARE_PROVIDER_SITE_OTHER): Payer: Commercial Managed Care - HMO | Admitting: Internal Medicine

## 2015-04-16 ENCOUNTER — Encounter: Payer: Self-pay | Admitting: Internal Medicine

## 2015-04-16 VITALS — BP 142/78 | HR 65 | Temp 98.2°F | Ht 68.0 in | Wt 206.0 lb

## 2015-04-16 DIAGNOSIS — E1165 Type 2 diabetes mellitus with hyperglycemia: Secondary | ICD-10-CM | POA: Diagnosis not present

## 2015-04-16 DIAGNOSIS — I6389 Other cerebral infarction: Secondary | ICD-10-CM

## 2015-04-16 DIAGNOSIS — I10 Essential (primary) hypertension: Secondary | ICD-10-CM | POA: Diagnosis not present

## 2015-04-16 DIAGNOSIS — I638 Other cerebral infarction: Secondary | ICD-10-CM | POA: Diagnosis not present

## 2015-04-16 DIAGNOSIS — IMO0002 Reserved for concepts with insufficient information to code with codable children: Secondary | ICD-10-CM

## 2015-04-16 NOTE — Assessment & Plan Note (Signed)
stable overall by history and exam, recent data reviewed with pt, and pt to continue medical treatment as before,  to f/u any worsening symptoms or concerns BP Readings from Last 3 Encounters:  04/16/15 142/78  04/08/15 169/74  04/03/15 132/78

## 2015-04-16 NOTE — Progress Notes (Signed)
Subjective:    Patient ID: Travis Carey, male    DOB: 25-Dec-1940, 74 y.o.   MRN: [id number]  HPI   Here to f/u cva acute - 1. Acute nonhemorrhagic infarct involving the posterior limb of the left internal capsule  Pt denies new neurological symptoms such as new headache, or facial or extremity weakness or numbness.  Slurred speech has resolved.  Pt denies chest pain, increased sob or doe, wheezing, orthopnea, PND, increased LE swelling, palpitations, dizziness or syncope.   Pt denies polydipsia, polyuria, or low sugar symptoms such as weakness or confusion improved with po intake.  Pt states overall good compliance with meds, trying to follow lower cholesterol, diabetic diet, wt overall stable but little exercise however.   Remains on plavix and asa 81 as before.  Other workup neg.  No plans to f/u with neuro at this time. BP Readings from Last 3 Encounters:  04/16/15 142/78  04/08/15 169/74  04/03/15 132/78  States BP at home usually < 140/90. Today was 140/70, often check most mornings Past Medical History  Diagnosis Date  . VITAMIN B12 DEFICIENCY 02/09/2010  . GLUCOSE INTOLERANCE 01/28/2010  . HYPERLIPIDEMIA 02/07/2008  . HYPOKALEMIA 01/28/2010  . ANEMIA-NOS 01/28/2010  . ERECTILE DYSFUNCTION 02/24/2009  . DEPRESSION 06/12/2007  . Carpal tunnel syndrome 05/20/2008  . HYPERTENSION 06/09/2007  . CAD 02/17/2010  . HEMORRHOIDS 06/09/2007  . URI 08/19/2008  . GERD 06/09/2007  . SHOULDER PAIN, LEFT 04/25/2008  . LOW BACK PAIN 06/12/2007  . PARESTHESIA 04/25/2008  . CAROTID BRUIT 11/18/2009  . DYSPNEA 07/28/2010  . CHEST PAIN 09/26/2009  . MYOCARDIAL PERFUSION SCAN, WITH STRESS TEST, ABNORMAL 10/08/2009  . Impaired glucose tolerance 01/23/2011  . Cervical radiculitis 04/17/2012  . Allergic rhinitis, cause unspecified 01/31/2013  . BPH (benign prostatic hypertrophy) 04/25/2013  . GERD (gastroesophageal reflux disease)    Past Surgical History  Procedure Laterality Date  . Coronary stent placement     from right radial  . Colonoscopy      reports that he has never smoked. He has never used smokeless tobacco. He reports that he does not drink alcohol or use illicit drugs. family history includes Alcohol abuse in his brother and sister; Coronary artery disease in his other; Heart attack in his other; Heart disease in his father and mother; Hyperlipidemia in his father and mother; Hypertension in his father, mother, and other; Stroke in his other. Allergies  Allergen Reactions  . Diclofenac Sodium     REACTION: maks pt drowsey   Current Outpatient Prescriptions on File Prior to Visit  Medication Sig Dispense Refill  . amLODipine (NORVASC) 10 MG tablet Take 1 tablet (10 mg total) by mouth daily. 30 tablet 3  . aspirin EC 81 MG tablet Take 81 mg by mouth daily.    . carvedilol (COREG) 12.5 MG tablet Take 1 tablet (12.5 mg total) by mouth 2 (two) times daily. 180 tablet 3  . clopidogrel (PLAVIX) 75 MG tablet TAKE ONE TABLET BY MOUTH ONCE DAILY 90 tablet 1  . colchicine 0.6 MG tablet Take 1 tablet (0.6 mg total) by mouth daily. 90 tablet 3  . CRESTOR 10 MG tablet TAKE ONE TABLET BY MOUTH ONCE DAILY 30 tablet 3  . dexlansoprazole (DEXILANT) 60 MG capsule Take 1 capsule (60 mg total) by mouth daily. 90 capsule 3  . fexofenadine (ALLEGRA) 180 MG tablet Take 1 tablet (180 mg total) by mouth daily. As needed (Patient taking differently: Take 180 mg by mouth daily as  needed for allergies. ) 90 tablet 3  . fluticasone (FLONASE) 50 MCG/ACT nasal spray Place 2 sprays into both nostrils daily. 16 g 5  . HYDROcodone-acetaminophen (NORCO/VICODIN) 5-325 MG per tablet Take 1 tablet by mouth every 6 (six) hours as needed for moderate pain. 60 tablet 0  . lisinopril-hydrochlorothiazide (PRINZIDE,ZESTORETIC) 10-12.5 MG per tablet Take 1 tablet by mouth 2 (two) times daily.    . meloxicam (MOBIC) 7.5 MG tablet TAKE ONE TO TWO TABLETS BY MOUTH ONCE DAILY AS NEEDED FOR PAIN 60 tablet 0  . potassium chloride (K-DUR)  10 MEQ tablet TAKE TWO TABLETS BY MOUTH ONCE DAILY 180 tablet 3   No current facility-administered medications on file prior to visit.     Review of Systems  Constitutional: Negative for unusual diaphoresis or night sweats HENT: Negative for ringing in ear or discharge Eyes: Negative for double vision or worsening visual disturbance.  Respiratory: Negative for choking and stridor.   Gastrointestinal: Negative for vomiting or other signifcant bowel change Genitourinary: Negative for hematuria or change in urine volume.  Musculoskeletal: Negative for other MSK pain or swelling Skin: Negative for color change and worsening wound.  Neurological: Negative for tremors and numbness other than noted  Psychiatric/Behavioral: Negative for decreased concentration or agitation other than above       Objective:   Physical Exam BP 142/78 mmHg  Pulse 65  Temp(Src) 98.2 F (36.8 C) (Oral)  Ht 5\' 8"  (1.727 m)  Wt 206 lb (93.441 kg)  BMI 31.33 kg/m2  SpO2 95% VS noted,  Constitutional: Pt appears in no significant distress HENT: Head: NCAT.  Right Ear: External ear normal.  Left Ear: External ear normal.  Eyes: . Pupils are equal, round, and reactive to light. Conjunctivae and EOM are normal Neck: Normal range of motion. Neck supple.  Cardiovascular: Normal rate and regular rhythm.   Pulmonary/Chest: Effort normal and breath sounds without rales or wheezing.  Abd:  Soft, NT, ND, + BS Neurological: Pt is alert. Not confused , motor grossly intact Skin: Skin is warm. No rash, no LE edema Psychiatric: Pt behavior is normal. No agitation.     Assessment & Plan:

## 2015-04-16 NOTE — Patient Instructions (Addendum)
Please continue all other medications as before, and refills have been done if requested.  Please have the pharmacy call with any other refills you may need.  Please continue your efforts at being more active, low cholesterol diet, and weight control.  Please keep your appointments with your specialists as you may have planned  Please return in 3 months, or sooner if needed, with Lab testing done 3-5 days before  

## 2015-04-16 NOTE — Assessment & Plan Note (Signed)
stable overall by history and exam, recent data reviewed with pt, and pt to continue medical treatment as before,  to f/u any worsening symptoms or concerns Lab Results  Component Value Date   HGBA1C 6.2 04/04/2015

## 2015-04-16 NOTE — Assessment & Plan Note (Signed)
Post limb of left internal capsule, small vessel related, to cont asa/plavix, cont to monitor risk factors,  to f/u any worsening symptoms or concerns

## 2015-04-28 ENCOUNTER — Other Ambulatory Visit: Payer: Self-pay | Admitting: *Deleted

## 2015-04-28 MED ORDER — ROSUVASTATIN CALCIUM 10 MG PO TABS: 10.0000 mg | ORAL_TABLET | Freq: Every day | ORAL | Status: DC

## 2015-06-04 ENCOUNTER — Encounter: Payer: Self-pay | Admitting: Cardiovascular Disease

## 2015-06-04 ENCOUNTER — Ambulatory Visit (INDEPENDENT_AMBULATORY_CARE_PROVIDER_SITE_OTHER): Payer: Commercial Managed Care - HMO | Admitting: Cardiovascular Disease

## 2015-06-04 VITALS — BP 152/74 | HR 56 | Ht 68.0 in | Wt 203.0 lb

## 2015-06-04 DIAGNOSIS — E785 Hyperlipidemia, unspecified: Secondary | ICD-10-CM

## 2015-06-04 DIAGNOSIS — I2510 Atherosclerotic heart disease of native coronary artery without angina pectoris: Secondary | ICD-10-CM

## 2015-06-04 DIAGNOSIS — E1165 Type 2 diabetes mellitus with hyperglycemia: Secondary | ICD-10-CM

## 2015-06-04 DIAGNOSIS — I638 Other cerebral infarction: Secondary | ICD-10-CM

## 2015-06-04 DIAGNOSIS — I10 Essential (primary) hypertension: Secondary | ICD-10-CM

## 2015-06-04 DIAGNOSIS — IMO0002 Reserved for concepts with insufficient information to code with codable children: Secondary | ICD-10-CM

## 2015-06-04 DIAGNOSIS — I6389 Other cerebral infarction: Secondary | ICD-10-CM

## 2015-06-04 NOTE — Assessment & Plan Note (Signed)
Recommended that he stay on his Crestor

## 2015-06-04 NOTE — Assessment & Plan Note (Addendum)
Back to his baseline On aspirin and Plavix Denies any tachycardia concerning for arrhythmia Monitor in the hospital with no mention of arrhythmia If he does develop any palpitations, would order a 30 day monitor

## 2015-06-04 NOTE — Progress Notes (Signed)
Patient ID: Travis Carey, male    DOB: December 15, 1940, 74 y.o.   MRN: [id number]  HPI Comments: Travis Carey is a 74 year old gentleman with coronary artery disease, stent to the RCA, hyperlipidemia who presents for routine followup of his coronary artery disease. H/o unstable angina with stenting of the RCA on 10/31/2009.    Recent stroke symptoms, evaluated in the hospital several weeks ago in June 2016 He had slurred speech, difficulty swallowing Carotid ultrasound showed no hemodynamically significant stenosis Echocardiogram was essentially normal Discharged with aspirin and Plavix Reports his symptoms are dramatically better, occasionally has some speech difficulty Walking without any difficulty Denies any significant chest pain, palpitations, tachycardia Occasionally has significant GERD in the morning. Does not take his PPI consistently  EKG on today's visit shows normal sinus rhythm with right bundle branch block, left anterior fascicular block, rate 56 bpm  Other past medical history He works as a Dealer. He continues to have hand swelling, possible carpal tunnel issues.  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  Has several dogs at home  Prior lab work showing total cholesterol 153, LDL 89 which is higher from previous lab work Hemoglobin A1c 6.3   Allergies  Allergen Reactions  . Diclofenac Sodium     REACTION: maks pt drowsey    Current Outpatient Prescriptions on File Prior to Visit  Medication Sig Dispense Refill  . amLODipine (NORVASC) 10 MG tablet Take 1 tablet (10 mg total) by mouth daily. 30 tablet 3  . aspirin EC 81 MG tablet Take 81 mg by mouth daily.    . carvedilol (COREG) 12.5 MG tablet Take 1 tablet (12.5 mg total) by mouth 2 (two) times daily. 180 tablet 3  . clopidogrel (PLAVIX) 75 MG tablet TAKE ONE TABLET BY MOUTH ONCE DAILY 90 tablet 1  . colchicine 0.6 MG tablet Take 1 tablet (0.6 mg total) by mouth daily. (Patient taking  differently: Take 0.6 mg by mouth as needed. ) 90 tablet 3  . dexlansoprazole (DEXILANT) 60 MG capsule Take 1 capsule (60 mg total) by mouth daily. 90 capsule 3  . fexofenadine (ALLEGRA) 180 MG tablet Take 1 tablet (180 mg total) by mouth daily. As needed (Patient taking differently: Take 180 mg by mouth daily as needed for allergies. ) 90 tablet 3  . fluticasone (FLONASE) 50 MCG/ACT nasal spray Place 2 sprays into both nostrils daily. (Patient taking differently: Place 2 sprays into both nostrils as needed. ) 16 g 5  . HYDROcodone-acetaminophen (NORCO/VICODIN) 5-325 MG per tablet Take 1 tablet by mouth every 6 (six) hours as needed for moderate pain. 60 tablet 0  . lisinopril-hydrochlorothiazide (PRINZIDE,ZESTORETIC) 10-12.5 MG per tablet Take 1 tablet by mouth 2 (two) times daily.    . meloxicam (MOBIC) 7.5 MG tablet TAKE ONE TO TWO TABLETS BY MOUTH ONCE DAILY AS NEEDED FOR PAIN 60 tablet 0  . potassium chloride (K-DUR) 10 MEQ tablet TAKE TWO TABLETS BY MOUTH ONCE DAILY 180 tablet 3  . rosuvastatin (CRESTOR) 10 MG tablet Take 1 tablet (10 mg total) by mouth daily. 30 tablet 3   No current facility-administered medications on file prior to visit.    Past Medical History  Diagnosis Date  . VITAMIN B12 DEFICIENCY 02/09/2010  . GLUCOSE INTOLERANCE 01/28/2010  . HYPERLIPIDEMIA 02/07/2008  . HYPOKALEMIA 01/28/2010  . ANEMIA-NOS 01/28/2010  . ERECTILE DYSFUNCTION 02/24/2009  . DEPRESSION 06/12/2007  . Carpal tunnel syndrome 05/20/2008  . HYPERTENSION 06/09/2007  .  CAD 02/17/2010  . HEMORRHOIDS 06/09/2007  . URI 08/19/2008  . GERD 06/09/2007  . SHOULDER PAIN, LEFT 04/25/2008  . LOW BACK PAIN 06/12/2007  . PARESTHESIA 04/25/2008  . CAROTID BRUIT 11/18/2009  . DYSPNEA 07/28/2010  . CHEST PAIN 09/26/2009  . MYOCARDIAL PERFUSION SCAN, WITH STRESS TEST, ABNORMAL 10/08/2009  . Impaired glucose tolerance 01/23/2011  . Cervical radiculitis 04/17/2012  . Allergic rhinitis, cause unspecified 01/31/2013  . BPH (benign  prostatic hypertrophy) 04/25/2013  . GERD (gastroesophageal reflux disease)     Past Surgical History  Procedure Laterality Date  . Coronary stent placement      from right radial  . Colonoscopy      Social History  reports that he has never smoked. He has never used smokeless tobacco. He reports that he does not drink alcohol or use illicit drugs.  Family History family history includes Alcohol abuse in his brother and sister; Coronary artery disease in his other; Heart attack in his other; Heart disease in his father and mother; Hyperlipidemia in his father and mother; Hypertension in his father, mother, and other; Stroke in his other.   Review of Systems  Constitutional: Negative.   HENT: Negative.   Eyes: Negative.   Respiratory: Negative.   Cardiovascular: Negative.   Gastrointestinal: Negative.   Endocrine: Negative.   Musculoskeletal: Positive for arthralgias.       Wrist pain  Skin: Negative.   Allergic/Immunologic: Negative.   Neurological: Negative.   Hematological: Negative.   Psychiatric/Behavioral: Negative.   All other systems reviewed and are negative.   BP 152/74 mmHg  Pulse 56  Ht 5\' 8"  (1.727 m)  Wt 203 lb (92.08 kg)  BMI 30.87 kg/m2  Physical Exam  Constitutional: He is oriented to person, place, and time. He appears well-developed and well-nourished.  HENT:  Head: Normocephalic.  Nose: Nose normal.  Mouth/Throat: Oropharynx is clear and moist.  Eyes: Conjunctivae are normal. Pupils are equal, round, and reactive to light.  Neck: Normal range of motion. Neck supple. No JVD present.  Cardiovascular: Normal rate, regular rhythm, S1 normal, S2 normal, normal heart sounds and intact distal pulses.  Exam reveals no gallop and no friction rub.   No murmur heard. Pulmonary/Chest: Effort normal and breath sounds normal. No respiratory distress. He has no wheezes. He has no rales. He exhibits no tenderness.  Abdominal: Soft. Bowel sounds are normal. He  exhibits no distension. There is no tenderness.  Musculoskeletal: Normal range of motion. He exhibits no edema or tenderness.  Lymphadenopathy:    He has no cervical adenopathy.  Neurological: He is alert and oriented to person, place, and time. Coordination normal.  Skin: Skin is warm and dry. No rash noted. No erythema.  Psychiatric: He has a normal mood and affect. His behavior is normal. Judgment and thought content normal.      Assessment and Plan   Nursing note and vitals reviewed.

## 2015-06-04 NOTE — Assessment & Plan Note (Signed)
Blood pressure elevated even on my recheck Reports better blood pressure at home. Recommended he continue to monitor blood pressure at home and call our office with numbers

## 2015-06-04 NOTE — Assessment & Plan Note (Signed)
We have encouraged continued exercise, careful diet management in an effort to lose weight. 

## 2015-06-04 NOTE — Patient Instructions (Signed)
You are doing well. No medication changes were made.  Please call us if you have new issues that need to be addressed before your next appt.  Your physician wants you to follow-up in: 6 months.  You will receive a reminder letter in the mail two months in advance. If you don't receive a letter, please call our office to schedule the follow-up appointment.   

## 2015-06-04 NOTE — Assessment & Plan Note (Signed)
Currently with no symptoms of angina. No further workup at this time. Continue current medication regimen. 

## 2015-06-10 ENCOUNTER — Other Ambulatory Visit: Payer: Self-pay | Admitting: Cardiovascular Disease

## 2015-06-11 NOTE — Telephone Encounter (Signed)
This encounter was created in error - please disregard.

## 2015-07-06 ENCOUNTER — Other Ambulatory Visit: Payer: Self-pay | Admitting: Internal Medicine

## 2015-07-25 ENCOUNTER — Other Ambulatory Visit: Payer: Self-pay | Admitting: Cardiovascular Disease

## 2015-07-29 ENCOUNTER — Other Ambulatory Visit: Payer: Self-pay

## 2015-07-29 MED ORDER — FLUTICASONE PROPIONATE 50 MCG/ACT NA SUSP: NASAL | Status: DC

## 2015-07-29 MED ORDER — POTASSIUM CHLORIDE ER 10 MEQ PO TBCR: 20.0000 meq | EXTENDED_RELEASE_TABLET | Freq: Every day | ORAL | Status: DC

## 2015-07-29 MED ORDER — ROSUVASTATIN CALCIUM 10 MG PO TABS: 10.0000 mg | ORAL_TABLET | Freq: Every day | ORAL | Status: DC

## 2015-07-29 MED ORDER — MELOXICAM 7.5 MG PO TABS: ORAL_TABLET | ORAL | Status: DC

## 2015-07-29 MED ORDER — LISINOPRIL-HYDROCHLOROTHIAZIDE 10-12.5 MG PO TABS: 2.0000 | ORAL_TABLET | Freq: Every day | ORAL | Status: DC

## 2015-08-07 ENCOUNTER — Other Ambulatory Visit (INDEPENDENT_AMBULATORY_CARE_PROVIDER_SITE_OTHER): Payer: Commercial Managed Care - HMO

## 2015-08-07 ENCOUNTER — Encounter: Payer: Self-pay | Admitting: Internal Medicine

## 2015-08-07 ENCOUNTER — Ambulatory Visit (INDEPENDENT_AMBULATORY_CARE_PROVIDER_SITE_OTHER): Payer: Commercial Managed Care - HMO | Admitting: Internal Medicine

## 2015-08-07 VITALS — BP 134/74 | HR 62 | Temp 98.6°F | Ht 68.0 in | Wt 209.0 lb

## 2015-08-07 DIAGNOSIS — IMO0001 Reserved for inherently not codable concepts without codable children: Secondary | ICD-10-CM

## 2015-08-07 DIAGNOSIS — M545 Low back pain, unspecified: Secondary | ICD-10-CM

## 2015-08-07 DIAGNOSIS — Z0000 Encounter for general adult medical examination without abnormal findings: Secondary | ICD-10-CM

## 2015-08-07 DIAGNOSIS — Z860100 Personal history of colon polyps, unspecified: Secondary | ICD-10-CM

## 2015-08-07 DIAGNOSIS — G8929 Other chronic pain: Secondary | ICD-10-CM | POA: Diagnosis not present

## 2015-08-07 DIAGNOSIS — E1165 Type 2 diabetes mellitus with hyperglycemia: Secondary | ICD-10-CM

## 2015-08-07 DIAGNOSIS — Z23 Encounter for immunization: Secondary | ICD-10-CM

## 2015-08-07 DIAGNOSIS — J309 Allergic rhinitis, unspecified: Secondary | ICD-10-CM

## 2015-08-07 DIAGNOSIS — R7989 Other specified abnormal findings of blood chemistry: Secondary | ICD-10-CM

## 2015-08-07 HISTORY — DX: Personal history of colon polyps, unspecified: Z86.0100

## 2015-08-07 HISTORY — DX: Personal history of colonic polyps: Z86.010

## 2015-08-07 LAB — URINALYSIS, ROUTINE W REFLEX MICROSCOPIC
BILIRUBIN URINE: NEGATIVE
Hgb urine dipstick: NEGATIVE
Ketones, ur: NEGATIVE
NITRITE: NEGATIVE
PH: 6 (ref 5.0–8.0)
RBC / HPF: NONE SEEN (ref 0–?)
Specific Gravity, Urine: 1.015 (ref 1.000–1.030)
TOTAL PROTEIN, URINE-UPE24: NEGATIVE
Urine Glucose: NEGATIVE
Urobilinogen, UA: 0.2 (ref 0.0–1.0)

## 2015-08-07 LAB — CBC WITH DIFFERENTIAL/PLATELET
BASOS ABS: 0 10*3/uL (ref 0.0–0.1)
Basophils Relative: 0.3 % (ref 0.0–3.0)
Eosinophils Absolute: 0.1 10*3/uL (ref 0.0–0.7)
Eosinophils Relative: 2.5 % (ref 0.0–5.0)
HCT: 39 % (ref 39.0–52.0)
HEMOGLOBIN: 13.1 g/dL (ref 13.0–17.0)
Lymphocytes Relative: 40.2 % (ref 12.0–46.0)
Lymphs Abs: 2.2 10*3/uL (ref 0.7–4.0)
MCHC: 33.5 g/dL (ref 30.0–36.0)
MCV: 89.6 fl (ref 78.0–100.0)
MONO ABS: 0.5 10*3/uL (ref 0.1–1.0)
Monocytes Relative: 9.7 % (ref 3.0–12.0)
Neutro Abs: 2.6 10*3/uL (ref 1.4–7.7)
Neutrophils Relative %: 47.3 % (ref 43.0–77.0)
Platelets: 223 10*3/uL (ref 150.0–400.0)
RBC: 4.35 Mil/uL (ref 4.22–5.81)
RDW: 14.6 % (ref 11.5–15.5)
WBC: 5.5 10*3/uL (ref 4.0–10.5)

## 2015-08-07 LAB — BASIC METABOLIC PANEL
BUN: 12 mg/dL (ref 6–23)
CHLORIDE: 102 meq/L (ref 96–112)
CO2: 33 mEq/L — ABNORMAL HIGH (ref 19–32)
Calcium: 10 mg/dL (ref 8.4–10.5)
Creatinine, Ser: 1.1 mg/dL (ref 0.40–1.50)
GFR: 84.24 mL/min (ref >60.00)
Glucose, Bld: 130 mg/dL — ABNORMAL HIGH (ref 70–99)
Potassium: 3.5 mEq/L (ref 3.5–5.1)
Sodium: 142 mEq/L (ref 135–145)

## 2015-08-07 LAB — HEPATIC FUNCTION PANEL
ALBUMIN: 4 g/dL (ref 3.5–5.2)
ALT: 16 U/L (ref 0–53)
AST: 21 U/L (ref 0–37)
Alkaline Phosphatase: 54 U/L (ref 39–117)
Bilirubin, Direct: 0.1 mg/dL (ref 0.0–0.3)
TOTAL PROTEIN: 7 g/dL (ref 6.0–8.3)
Total Bilirubin: 0.5 mg/dL (ref 0.2–1.2)

## 2015-08-07 LAB — HEMOGLOBIN A1C: Hgb A1c MFr Bld: 6.2 % (ref 4.6–6.5)

## 2015-08-07 LAB — MICROALBUMIN / CREATININE URINE RATIO
CREATININE, U: 123.3 mg/dL
MICROALB/CREAT RATIO: 0.6 mg/g (ref 0.0–30.0)
Microalb, Ur: <0.7 mg/dL (ref 0.0–1.9)

## 2015-08-07 LAB — TSH: TSH: 1.77 u[IU]/mL (ref 0.35–4.50)

## 2015-08-07 LAB — LIPID PANEL
CHOL/HDL RATIO: 5
CHOLESTEROL: 155 mg/dL (ref 0–200)
HDL: 30.6 mg/dL — ABNORMAL LOW (ref >39.00)
NONHDL: 124.57
TRIGLYCERIDES: 275 mg/dL — AB (ref 0.0–149.0)
VLDL: 55 mg/dL — AB (ref 0.0–40.0)

## 2015-08-07 LAB — LDL CHOLESTEROL, DIRECT: Direct LDL: 72 mg/dL

## 2015-08-07 LAB — PSA: PSA: 0.41 ng/mL (ref 0.10–4.00)

## 2015-08-07 MED ORDER — HYDROCODONE-ACETAMINOPHEN 5-325 MG PO TABS: 1.0000 | ORAL_TABLET | Freq: Four times a day (QID) | ORAL | Status: DC | PRN

## 2015-08-07 MED ORDER — METHYLPREDNISOLONE ACETATE 80 MG/ML IJ SUSP
80.0000 mg | Freq: Once | INTRAMUSCULAR | Status: AC
  Administered 2015-08-07: 80 mg via INTRAMUSCULAR

## 2015-08-07 MED ORDER — FLUTICASONE PROPIONATE 50 MCG/ACT NA SUSP: NASAL | Status: DC

## 2015-08-07 NOTE — Progress Notes (Signed)
Subjective:    Patient ID: Travis Carey, male    DOB: 23-Oct-1940, 74 y.o.   MRN: [id number]  HPI Here for wellness and f/u;  Overall doing ok;  Pt denies Chest pain, worsening SOB, DOE, wheezing, orthopnea, PND, worsening LE edema, palpitations, dizziness or syncope.  Pt denies neurological change such as new headache, facial or extremity weakness.  Pt denies polydipsia, polyuria, or low sugar symptoms. Pt states overall good compliance with treatment and medications, good tolerability, and has been trying to follow appropriate diet.  Pt denies worsening depressive symptoms, suicidal ideation or panic. No fever, night sweats, wt loss, loss of appetite, or other constitutional symptoms.  Pt states good ability with ADL's, has low fall risk, home safety reviewed and adequate, no other significant changes in hearing or vision, and only occasionally active with exercise.  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  Pt continues to have recurring LBP without change in severity, bowel or bladder change, fever, wt loss,  worsening LE pain/numbness/weakness, gait change or falls, asks for pain med refill. Asks for depomedrol IM as this has helped several times in the past with pain relief for several months at a time. Past Medical History  Diagnosis Date  . VITAMIN B12 DEFICIENCY 02/09/2010  . GLUCOSE INTOLERANCE 01/28/2010  . HYPERLIPIDEMIA 02/07/2008  . HYPOKALEMIA 01/28/2010  . ANEMIA-NOS 01/28/2010  . ERECTILE DYSFUNCTION 02/24/2009  . DEPRESSION 06/12/2007  . Carpal tunnel syndrome 05/20/2008  . HYPERTENSION 06/09/2007  . CAD 02/17/2010  . HEMORRHOIDS 06/09/2007  . URI 08/19/2008  . GERD 06/09/2007  . SHOULDER PAIN, LEFT 04/25/2008  . LOW BACK PAIN  06/12/2007  . PARESTHESIA 04/25/2008  . CAROTID BRUIT 11/18/2009  . DYSPNEA 07/28/2010  . CHEST PAIN 09/26/2009  . MYOCARDIAL PERFUSION SCAN, WITH STRESS TEST, ABNORMAL 10/08/2009  . Impaired glucose tolerance 01/23/2011  . Cervical radiculitis 04/17/2012  . Allergic rhinitis, cause unspecified 01/31/2013  . BPH (benign prostatic hypertrophy) 04/25/2013  . GERD (gastroesophageal reflux disease)   . History of colonic polyps 08/07/2015   Past Surgical History  Procedure Laterality Date  . Coronary stent placement      from right radial  . Colonoscopy      reports that he has never smoked. He has never used smokeless tobacco. He reports that he does not drink alcohol or use illicit drugs. family history includes Alcohol abuse in his brother and sister; Coronary artery disease in his other; Heart attack in his other; Heart disease in his father and mother; Hyperlipidemia in his father and mother; Hypertension in his father, mother, and other; Stroke in his other. Allergies  Allergen Reactions  . Diclofenac Sodium     REACTION: maks pt drowsey   Current Outpatient Prescriptions on File Prior to Visit  Medication Sig Dispense Refill  . amLODipine (NORVASC) 10 MG tablet TAKE ONE TABLET BY MOUTH ONCE DAILY 30 tablet 3  . aspirin EC 81 MG tablet Take 81 mg by mouth daily.    . carvedilol (COREG) 12.5 MG tablet Take 1 tablet (12.5 mg total) by mouth 2 (two) times daily. 180 tablet 3  . clopidogrel (PLAVIX) 75 MG tablet TAKE ONE TABLET BY MOUTH ONCE DAILY 90 tablet 1  . colchicine  0.6 MG tablet Take 1 tablet (0.6 mg total) by mouth daily. (Patient taking differently: Take 0.6 mg by mouth as needed. ) 90 tablet 3  . dexlansoprazole (DEXILANT) 60 MG capsule Take 1 capsule (60 mg total) by mouth daily. 90 capsule 3  . fexofenadine (ALLEGRA) 180 MG tablet Take 1 tablet (180 mg total) by mouth daily. As needed (Patient taking differently: Take 180 mg by mouth daily as needed for allergies. ) 90 tablet 3  .  lisinopril-hydrochlorothiazide (PRINZIDE,ZESTORETIC) 10-12.5 MG per tablet Take 1 tablet by mouth 2 (two) times daily.    Marland Kitchen lisinopril-hydrochlorothiazide (PRINZIDE,ZESTORETIC) 10-12.5 MG tablet Take 2 tablets by mouth daily. 180 tablet 1  . meloxicam (MOBIC) 7.5 MG tablet TAKE ONE TO TWO TABLETS BY MOUTH ONCE DAILY AS NEEDED FOR PAIN 180 tablet 1  . potassium chloride (K-DUR) 10 MEQ tablet Take 2 tablets (20 mEq total) by mouth daily. 180 tablet 1  . rosuvastatin (CRESTOR) 10 MG tablet Take 1 tablet (10 mg total) by mouth daily. 90 tablet 1   No current facility-administered medications on file prior to visit.   Review of Systems Constitutional: Negative for increased diaphoresis, other activity, appetite or siginficant weight change other than noted HENT: Negative for worsening hearing loss, ear pain, facial swelling, mouth sores and neck stiffness.   Eyes: Negative for other worsening pain, redness or visual disturbance.  Respiratory: Negative for shortness of breath and wheezing  Cardiovascular: Negative for chest pain and palpitations.  Gastrointestinal: Negative for diarrhea, blood in stool, abdominal distention or other pain Genitourinary: Negative for hematuria, flank pain or change in urine volume.  Musculoskeletal: Negative for myalgias or other joint complaints.  Skin: Negative for color change and wound or drainage.  Neurological: Negative for syncope and numbness. other than noted Hematological: Negative for adenopathy. or other swelling Psychiatric/Behavioral: Negative for hallucinations, SI, self-injury, decreased concentration or other worsening agitation.      Objective:   Physical Exam BP 134/74 mmHg  Pulse 62  Temp(Src) 98.6 F (37 C) (Oral)  Ht 5\' 8"  (1.727 m)  Wt 209 lb (94.802 kg)  BMI 31.79 kg/m2  SpO2 98% VS noted,  Constitutional: Pt is oriented to person, place, and time. Appears well-developed and well-nourished, in no significant distress Head:  Normocephalic and atraumatic.  Right Ear: External ear normal.  Left Ear: External ear normal.  Nose: Nose normal.  Mouth/Throat: Oropharynx is clear and moist.  Eyes: Conjunctivae and EOM are normal. Pupils are equal, round, and reactive to light.  Neck: Normal range of motion. Neck supple. No JVD present. No tracheal deviation present or significant neck LA or mass Cardiovascular: Normal rate, regular rhythm, normal heart sounds and intact distal pulses.   Pulmonary/Chest: Effort normal and breath sounds without rales or wheezing  Abdominal: Soft. Bowel sounds are normal. NT. No HSM  Musculoskeletal: Normal range of motion. Exhibits no edema.  Lymphadenopathy:  Has no cervical adenopathy.  Neurological: Pt is alert and oriented to person, place, and time. Pt has normal reflexes. No cranial nerve deficit. Motor 5/5 intact - no residual post cva Spine nontender Skin: Skin is warm and dry. No rash noted.  Psychiatric:  Has normal mood and affect. Behavior is normal.      Assessment & Plan:

## 2015-08-07 NOTE — Assessment & Plan Note (Signed)
stable overall by history and exam, recent data reviewed with pt, and pt to continue medical treatment as before,  to f/u any worsening symptoms or concerns Lab Results  Component Value Date   HGBA1C 6.2 04/04/2015    

## 2015-08-07 NOTE — Patient Instructions (Addendum)
You had the flu shot today, and the steroid shot as well  Please continue all other medications as before, and refills have been done if requested - the flonase, and the hydrocodone  Please have the pharmacy call with any other refills you may need.  Please continue your efforts at being more active, low cholesterol diet, and weight control.  You are otherwise up to date with prevention measures today.  Please keep your appointments with your specialists as you may have planned  Please go to the LAB in the Basement (turn left off the elevator) for the tests to be done today  You will be contacted by phone if any changes need to be made immediately.  Otherwise, you will receive a letter about your results with an explanation, but please check with MyChart first.  Please remember to sign up for MyChart if you have not done so, as this will be important to you in the future with finding out test results, communicating by private email, and scheduling acute appointments online when needed.  Please return in 6 months, or sooner if needed, with Lab testing done 3-5 days before

## 2015-08-07 NOTE — Addendum Note (Signed)
Addended by: Lyman Bishop on: 08/07/2015 10:07 AM   Modules accepted: Orders

## 2015-08-07 NOTE — Assessment & Plan Note (Signed)
Possibly some inflammatory component , has improved symptomaticlly with depomedrol in past, ok for repeat depomedrol 80 IM now, hydrocodone prn, no new neuro change, will hold on further imaging or referral at this time

## 2015-08-07 NOTE — Assessment & Plan Note (Signed)

## 2015-08-07 NOTE — Progress Notes (Signed)
Pre visit review using our clinic review tool, if applicable. No additional management support is needed unless otherwise documented below in the visit note. 

## 2015-08-07 NOTE — Assessment & Plan Note (Signed)
Mild to mod, for flonase restart,  to f/u any worsening symptoms or concerns 

## 2015-08-08 ENCOUNTER — Encounter: Payer: Self-pay | Admitting: Internal Medicine

## 2015-08-13 ENCOUNTER — Other Ambulatory Visit: Payer: Self-pay

## 2015-08-13 MED ORDER — AMLODIPINE BESYLATE 10 MG PO TABS: 10.0000 mg | ORAL_TABLET | Freq: Every day | ORAL | Status: DC

## 2015-08-13 MED ORDER — CLOPIDOGREL BISULFATE 75 MG PO TABS: 75.0000 mg | ORAL_TABLET | Freq: Every day | ORAL | Status: DC

## 2015-08-13 MED ORDER — CARVEDILOL 12.5 MG PO TABS: 12.5000 mg | ORAL_TABLET | Freq: Two times a day (BID) | ORAL | Status: DC

## 2015-08-28 ENCOUNTER — Other Ambulatory Visit: Payer: Self-pay | Admitting: Cardiovascular Disease

## 2015-10-12 DIAGNOSIS — M109 Gout, unspecified: Secondary | ICD-10-CM

## 2015-10-12 HISTORY — DX: Gout, unspecified: M10.9

## 2016-01-15 ENCOUNTER — Encounter: Payer: Self-pay | Admitting: Cardiovascular Disease

## 2016-01-15 ENCOUNTER — Ambulatory Visit (INDEPENDENT_AMBULATORY_CARE_PROVIDER_SITE_OTHER): Payer: Commercial Managed Care - HMO | Admitting: Cardiovascular Disease

## 2016-01-15 VITALS — BP 140/82 | HR 58 | Ht 68.0 in | Wt 210.2 lb

## 2016-01-15 DIAGNOSIS — IMO0002 Reserved for concepts with insufficient information to code with codable children: Secondary | ICD-10-CM

## 2016-01-15 DIAGNOSIS — I2510 Atherosclerotic heart disease of native coronary artery without angina pectoris: Secondary | ICD-10-CM

## 2016-01-15 DIAGNOSIS — E1159 Type 2 diabetes mellitus with other circulatory complications: Secondary | ICD-10-CM | POA: Diagnosis not present

## 2016-01-15 DIAGNOSIS — I10 Essential (primary) hypertension: Secondary | ICD-10-CM

## 2016-01-15 DIAGNOSIS — E785 Hyperlipidemia, unspecified: Secondary | ICD-10-CM

## 2016-01-15 DIAGNOSIS — E1165 Type 2 diabetes mellitus with hyperglycemia: Secondary | ICD-10-CM

## 2016-01-15 DIAGNOSIS — K219 Gastro-esophageal reflux disease without esophagitis: Secondary | ICD-10-CM

## 2016-01-15 MED ORDER — OMEPRAZOLE 20 MG PO CPDR: 20.0000 mg | DELAYED_RELEASE_CAPSULE | Freq: Every day | ORAL | Status: DC

## 2016-01-15 NOTE — Progress Notes (Signed)
Patient ID: Travis Carey, male    DOB: 1941/02/27, 75 y.o.   MRN: [id number]  HPI Comments: Travis Carey is a 75 year old gentleman with coronary artery disease, stent to the RCA, hyperlipidemia who presents for routine followup of his coronary artery disease. H/o unstable angina with stenting of the RCA on 10/31/2009.   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 [place]   In follow-up, he reports having some chest discomfort in the afternoon's typically after lunch Symptoms seem to get better with antacids He is having to take these almost daily for symptoms On previous visit he had GERD in the mornings, was not taking a PPI consistently Reports it feels different from his previous anginal symptoms in 2011 Denies any TIA or stroke type symptoms  Also reports having significant runny nose, sinus congestion, not taking allergy medication  EKG on today's visit shows normal sinus rhythm with rate 58 bpm, nonspecific T wave abnormality in lead V5, V6, right bundle branch block  Other past medical history reviewed Previous stroke symptoms, evaluated in the hospital in June 2016 He had slurred speech, difficulty swallowing Carotid ultrasound showed no hemodynamically significant stenosis Echocardiogram was essentially normal Discharged with aspirin and Plavix  Denies any significant  palpitations, tachycardia  EKG on today's visit shows normal sinus rhythm with right bundle branch block, left anterior fascicular block, rate 56 bpm  Other past medical history He works as a Dealer. He continues to have hand swelling, possible carpal tunnel issues.  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  Has several dogs at home  Prior lab work showing total cholesterol 153, LDL 89 which is higher from previous lab work Hemoglobin A1c 6.3   Allergies  Allergen Reactions  . Diclofenac Sodium     REACTION: maks pt drowsey     Current Outpatient Prescriptions on File Prior to Visit  Medication Sig Dispense Refill  . amLODipine (NORVASC) 10 MG tablet Take 1 tablet (10 mg total) by mouth daily. 90 tablet 3  . aspirin EC 81 MG tablet Take 81 mg by mouth daily.    . carvedilol (COREG) 12.5 MG tablet Take 1 tablet (12.5 mg total) by mouth 2 (two) times daily. 180 tablet 3  . clopidogrel (PLAVIX) 75 MG tablet Take 1 tablet (75 mg total) by mouth daily. 90 tablet 3  . colchicine 0.6 MG tablet Take 1 tablet (0.6 mg total) by mouth daily. (Patient taking differently: Take 0.6 mg by mouth as needed. ) 90 tablet 3  . dexlansoprazole (DEXILANT) 60 MG capsule Take 1 capsule (60 mg total) by mouth daily. 90 capsule 3  . fexofenadine (ALLEGRA) 180 MG tablet Take 1 tablet (180 mg total) by mouth daily. As needed (Patient taking differently: Take 180 mg by mouth daily as needed for allergies. ) 90 tablet 3  . fluticasone (FLONASE) 50 MCG/ACT nasal spray USE TWO SPRAYS IN EACH NOSTRIL ONCE DAILY 16 g 5  . HYDROcodone-acetaminophen (NORCO/VICODIN) 5-325 MG tablet Take 1 tablet by mouth every 6 (six) hours as needed for moderate pain. 60 tablet 0  . lisinopril-hydrochlorothiazide (PRINZIDE,ZESTORETIC) 10-12.5 MG per tablet Take 1 tablet by mouth 2 (two) times daily.    . meloxicam (MOBIC) 7.5 MG tablet TAKE ONE TO TWO TABLETS BY MOUTH ONCE DAILY AS NEEDED FOR PAIN 180 tablet 1  . potassium chloride (K-DUR) 10 MEQ tablet Take 2 tablets (20 mEq total)  by mouth daily. 180 tablet 1  . rosuvastatin (CRESTOR) 10 MG tablet Take 1 tablet (10 mg total) by mouth daily. 90 tablet 1   No current facility-administered medications on file prior to visit.    Past Medical History  Diagnosis Date  . VITAMIN B12 DEFICIENCY 02/09/2010  . GLUCOSE INTOLERANCE 01/28/2010  . HYPERLIPIDEMIA 02/07/2008  . HYPOKALEMIA 01/28/2010  . ANEMIA-NOS 01/28/2010  . ERECTILE DYSFUNCTION 02/24/2009  . DEPRESSION 06/12/2007  . Carpal tunnel syndrome 05/20/2008  .  HYPERTENSION 06/09/2007  . CAD 02/17/2010  . HEMORRHOIDS 06/09/2007  . URI 08/19/2008  . GERD 06/09/2007  . SHOULDER PAIN, LEFT 04/25/2008  . LOW BACK PAIN 06/12/2007  . PARESTHESIA 04/25/2008  . CAROTID BRUIT 11/18/2009  . DYSPNEA 07/28/2010  . CHEST PAIN 09/26/2009  . MYOCARDIAL PERFUSION SCAN, WITH STRESS TEST, ABNORMAL 10/08/2009  . Impaired glucose tolerance 01/23/2011  . Cervical radiculitis 04/17/2012  . Allergic rhinitis, cause unspecified 01/31/2013  . BPH (benign prostatic hypertrophy) 04/25/2013  . GERD (gastroesophageal reflux disease)   . History of colonic polyps 08/07/2015    Past Surgical History  Procedure Laterality Date  . Coronary stent placement      from right radial  . Colonoscopy      Social History  reports that he has never smoked. He has never used smokeless tobacco. He reports that he does not drink alcohol or use illicit drugs.  Family History family history includes Alcohol abuse in his brother and sister; Coronary artery disease in his other; Heart attack in his other; Heart disease in his father and mother; Hyperlipidemia in his father and mother; Hypertension in his father, mother, and other; Stroke in his other.   Review of Systems  Constitutional: Negative.   Respiratory: Positive for chest tightness.   Cardiovascular: Negative.   Gastrointestinal: Negative.   Musculoskeletal: Negative.   Neurological: Negative.   Hematological: Negative.   Psychiatric/Behavioral: Negative.   All other systems reviewed and are negative.   BP 140/82 mmHg  Pulse 58  Ht 5\' 8"  (1.727 m)  Wt 210 lb 4 oz (95.369 kg)  BMI 31.98 kg/m2  Physical Exam  Constitutional: He is oriented to person, place, and time. He appears well-developed and well-nourished.  HENT:  Head: Normocephalic.  Nose: Nose normal.  Mouth/Throat: Oropharynx is clear and moist.  Eyes: Conjunctivae are normal. Pupils are equal, round, and reactive to light.  Neck: Normal range of motion. Neck  supple. No JVD present.  Cardiovascular: Normal rate, regular rhythm, S1 normal, S2 normal, normal heart sounds and intact distal pulses.  Exam reveals no gallop and no friction rub.   No murmur heard. Pulmonary/Chest: Effort normal and breath sounds normal. No respiratory distress. He has no wheezes. He has no rales. He exhibits no tenderness.  Abdominal: Soft. Bowel sounds are normal. He exhibits no distension. There is no tenderness.  Musculoskeletal: Normal range of motion. He exhibits no edema or tenderness.  Lymphadenopathy:    He has no cervical adenopathy.  Neurological: He is alert and oriented to person, place, and time. Coordination normal.  Skin: Skin is warm and dry. No rash noted. No erythema.  Psychiatric: He has a normal mood and affect. His behavior is normal. Judgment and thought content normal.      Assessment and Plan   Nursing note and vitals reviewed.

## 2016-01-15 NOTE — Assessment & Plan Note (Signed)
Stressed importance of weight loss He'll continue current dose of statin, goal LDL less than 70

## 2016-01-15 NOTE — Assessment & Plan Note (Signed)
Blood pressure is well controlled on today's visit. No changes made to the medications. 

## 2016-01-15 NOTE — Assessment & Plan Note (Addendum)
He is taking antacids daily. He is not taking Dexilant, reports it did not work though he did not try this for a long. He does not want to retry it. Recommended he start PPI such as omeprazole 20 mg daily for symptoms Discussed complications from chronic long-term reflux disease such as Barrett's esophagus He may need 40 mg daily in the future   Total encounter time more than 25 minutes  Greater than 50% was spent in counseling and coordination of care with the patient

## 2016-01-15 NOTE — Assessment & Plan Note (Signed)
Atypical type chest pain, possibly secondary to GERD He is a nonsmoker, no diabetes, cholesterol at goal Recommended he start PPI for his GERD symptoms  If no improvement in his symptoms in the next 2 weeks, recommended he call the office for a stress test

## 2016-01-15 NOTE — Assessment & Plan Note (Signed)
We have encouraged continued exercise, careful diet management in an effort to lose weight. Hemoglobin A1c in the low 6 range

## 2016-01-15 NOTE — Patient Instructions (Signed)
You are doing well.  For acid reflux, Start omeprazole one a day  For allergy, Start zyrtec (Cetirazine) one a day  Please call if chest pain does does not improve, This could the heart and we might need a stress test  Please call us if you have new issues that need to be addressed before your next appt.  Your physician wants you to follow-up in: 6 months.  You will receive a reminder letter in the mail two months in advance. If you don't receive a letter, please call our office to schedule the follow-up appointment.

## 2016-01-19 ENCOUNTER — Other Ambulatory Visit: Payer: Self-pay | Admitting: Internal Medicine

## 2016-01-24 ENCOUNTER — Encounter: Payer: Self-pay | Admitting: Emergency Medicine

## 2016-01-24 ENCOUNTER — Emergency Department
Admission: EM | Admit: 2016-01-24 | Discharge: 2016-01-24 | Disposition: A | Discharge status: Home / Self Care | Payer: Commercial Managed Care - HMO | Attending: Emergency Medicine | Admitting: Emergency Medicine

## 2016-01-24 DIAGNOSIS — J449 Chronic obstructive pulmonary disease, unspecified: Secondary | ICD-10-CM | POA: Diagnosis not present

## 2016-01-24 DIAGNOSIS — I10 Essential (primary) hypertension: Secondary | ICD-10-CM | POA: Insufficient documentation

## 2016-01-24 DIAGNOSIS — J329 Chronic sinusitis, unspecified: Secondary | ICD-10-CM | POA: Insufficient documentation

## 2016-01-24 DIAGNOSIS — J019 Acute sinusitis, unspecified: Secondary | ICD-10-CM | POA: Diagnosis not present

## 2016-01-24 DIAGNOSIS — Z955 Presence of coronary angioplasty implant and graft: Secondary | ICD-10-CM | POA: Diagnosis not present

## 2016-01-24 DIAGNOSIS — J40 Bronchitis, not specified as acute or chronic: Secondary | ICD-10-CM | POA: Insufficient documentation

## 2016-01-24 DIAGNOSIS — E119 Type 2 diabetes mellitus without complications: Secondary | ICD-10-CM | POA: Diagnosis not present

## 2016-01-24 DIAGNOSIS — R05 Cough: Secondary | ICD-10-CM | POA: Diagnosis present

## 2016-01-24 MED ORDER — HYDROCODONE-HOMATROPINE 5-1.5 MG/5ML PO SYRP: 5.0000 mL | ORAL_SOLUTION | Freq: Four times a day (QID) | ORAL | Status: DC | PRN

## 2016-01-24 MED ORDER — AZITHROMYCIN 250 MG PO TABS: ORAL_TABLET | ORAL | Status: DC

## 2016-01-24 NOTE — ED Notes (Signed)
Cough and sinus drainage x 4 days.

## 2016-01-24 NOTE — Discharge Instructions (Signed)
Sinusitis, Adult Sinusitis is redness, soreness, and puffiness (inflammation) of the air pockets in the bones of your face (sinuses). The redness, soreness, and puffiness can cause air and mucus to get trapped in your sinuses. This can allow germs to grow and cause an infection.  HOME CARE   Drink enough fluids to keep your pee (urine) clear or pale yellow.  Use a humidifier in your home.  Run a hot shower to create steam in the bathroom. Sit in the bathroom with the door closed. Breathe in the steam 3-4 times a day.  Put a warm, moist washcloth on your face 3-4 times a day, or as told by your doctor.  Use salt water sprays (saline sprays) to wet the thick fluid in your nose. This can help the sinuses drain.  Only take medicine as told by your doctor. GET HELP RIGHT AWAY IF:   Your pain gets worse.  You have very bad headaches.  You are sick to your stomach (nauseous).  You throw up (vomit).  You are very sleepy (drowsy) all the time.  Your face is puffy (swollen).  Your vision changes.  You have a stiff neck.  You have trouble breathing. MAKE SURE YOU:   Understand these instructions.  Will watch your condition.  Will get help right away if you are not doing well or get worse.   This information is not intended to replace advice given to you by your health care provider. Make sure you discuss any questions you have with your health care provider.   Document Released: 03/15/2008 Document Revised: 10/18/2014 Document Reviewed: 05/02/2012 Elsevier Interactive Patient Education 2016 [employer]   Take antibiotics and cough medicine as directed. If not improving should follow-up with your primary care physician.

## 2016-01-24 NOTE — ED Notes (Signed)
Discussed discharge instructions, prescriptions, and follow-up care with patient. No questions or concerns at this time. Pt stable at discharge.  

## 2016-01-24 NOTE — ED Provider Notes (Signed)
[facility] Emergency Department Provider Note  ____________________________________________  Time seen: Approximately 11:57 AM  I have reviewed the triage vital signs and the nursing notes.   HISTORY  Chief Complaint Cough    HPI Travis Carey is a 75 y.o. male with history of heart disease, hypertension who presents with 1 week history of URI symptoms. He had more shortness of breath, cough earlier in the week and now he has more sinus drainage. He continues to have low-grade fevers. No current shortness of breath or chest pain. He was seen by his cardiologist last week for routine checkup. He initially had some abdominal complaints of nausea and diarrhea but has resolved now.   Past Medical History  Diagnosis Date  . VITAMIN B12 DEFICIENCY 02/09/2010  . GLUCOSE INTOLERANCE 01/28/2010  . HYPERLIPIDEMIA 02/07/2008  . HYPOKALEMIA 01/28/2010  . ANEMIA-NOS 01/28/2010  . ERECTILE DYSFUNCTION 02/24/2009  . DEPRESSION 06/12/2007  . Carpal tunnel syndrome 05/20/2008  . HYPERTENSION 06/09/2007  . CAD 02/17/2010  . HEMORRHOIDS 06/09/2007  . URI 08/19/2008  . GERD 06/09/2007  . SHOULDER PAIN, LEFT 04/25/2008  . LOW BACK PAIN 06/12/2007  . PARESTHESIA 04/25/2008  . CAROTID BRUIT 11/18/2009  . DYSPNEA 07/28/2010  . CHEST PAIN 09/26/2009  . MYOCARDIAL PERFUSION SCAN, WITH STRESS TEST, ABNORMAL 10/08/2009  . Impaired glucose tolerance 01/23/2011  . Cervical radiculitis 04/17/2012  . Allergic rhinitis, cause unspecified 01/31/2013  . BPH (benign prostatic hypertrophy) 04/25/2013  . GERD (gastroesophageal reflux disease)   . History of colonic polyps 08/07/2015    Patient Active Problem List   Diagnosis Date Noted  . History of colonic polyps 08/07/2015  . CVA (cerebral infarction) 04/07/2015  . Rhinitis, allergic 03/29/2015  . Diabetes type 2, uncontrolled ([place]) 03/29/2015  . Right arm pain 11/27/2014  . Epididymal cyst 06/12/2014  . Left wrist pain 11/01/2013  . Right wrist  pain 11/01/2013  . Nocturia 11/01/2013  . Routine general medical examination at a health care facility 11/01/2013  . Obese 09/12/2013  . BPH (benign prostatic hypertrophy) 04/25/2013  . Knee pain, bilateral 04/25/2013  . Chest pain 01/31/2013  . Eustachian tube dysfunction 01/31/2013  . Cough 01/31/2013  . Allergic rhinitis 01/31/2013  . Cervical radiculitis 04/17/2012  . Cervical disc disease 04/17/2012  . Paresthesia of arm 12/08/2011  . Hypertension 12/08/2011  . Myalgia 11/05/2011  . B12 deficiency 11/05/2011  . Fatigue 07/27/2011  . COPD (chronic obstructive pulmonary disease) ([place]) 01/26/2011  . Left knee pain 01/26/2011  . Coronary atherosclerosis 02/17/2010  . VITAMIN B12 DEFICIENCY 02/09/2010  . ANEMIA-NOS 01/28/2010  . CAROTID BRUIT 11/18/2009  . ERECTILE DYSFUNCTION 02/24/2009  . Carpal tunnel syndrome 05/20/2008  . PARESTHESIA 04/25/2008  . Hyperlipidemia 02/07/2008  . DEPRESSION 06/12/2007  . LOW BACK PAIN 06/12/2007  . GERD 06/09/2007    Past Surgical History  Procedure Laterality Date  . Coronary stent placement      from right radial  . Colonoscopy      Current Outpatient Rx  Name  Route  Sig  Dispense  Refill  . amLODipine (NORVASC) 10 MG tablet   Oral   Take 1 tablet (10 mg total) by mouth daily.   90 tablet   3   . aspirin EC 81 MG tablet   Oral   Take 81 mg by mouth daily.         Marland Kitchen azithromycin (ZITHROMAX Z-PAK) 250 MG tablet      Take 2 tablets (500 mg) on  Day  1,  followed by 1 tablet (250 mg) once daily on Days 2 through 5.   6 each   0   . carvedilol (COREG) 12.5 MG tablet   Oral   Take 1 tablet (12.5 mg total) by mouth 2 (two) times daily.   180 tablet   3   . clopidogrel (PLAVIX) 75 MG tablet   Oral   Take 1 tablet (75 mg total) by mouth daily.   90 tablet   3   . colchicine 0.6 MG tablet   Oral   Take 1 tablet (0.6 mg total) by mouth daily. Patient taking differently: Take 0.6 mg by mouth as needed.    90 tablet    3   . dexlansoprazole (DEXILANT) 60 MG capsule   Oral   Take 1 capsule (60 mg total) by mouth daily.   90 capsule   3   . fexofenadine (ALLEGRA) 180 MG tablet   Oral   Take 1 tablet (180 mg total) by mouth daily. As needed Patient taking differently: Take 180 mg by mouth daily as needed for allergies.    90 tablet   3   . fluticasone (FLONASE) 50 MCG/ACT nasal spray      USE TWO SPRAYS IN EACH NOSTRIL ONCE DAILY   16 g   5   . HYDROcodone-acetaminophen (NORCO/VICODIN) 5-325 MG tablet   Oral   Take 1 tablet by mouth every 6 (six) hours as needed for moderate pain.   60 tablet   0   . HYDROcodone-homatropine (HYCODAN) 5-1.5 MG/5ML syrup   Oral   Take 5 mLs by mouth every 6 (six) hours as needed for cough.   120 mL   0   . lisinopril-hydrochlorothiazide (PRINZIDE,ZESTORETIC) 10-12.5 MG per tablet   Oral   Take 1 tablet by mouth 2 (two) times daily.         . meloxicam (MOBIC) 7.5 MG tablet      TAKE 1 TO 2 TABLETS ONE TIME DAILY AS NEEDED FOR PAIN   180 tablet   1   . omeprazole (PRILOSEC) 20 MG capsule   Oral   Take 1 capsule (20 mg total) by mouth daily.   30 capsule   3   . potassium chloride (K-DUR) 10 MEQ tablet   Oral   Take 2 tablets (20 mEq total) by mouth daily.   180 tablet   1   . rosuvastatin (CRESTOR) 10 MG tablet   Oral   Take 1 tablet (10 mg total) by mouth daily.   90 tablet   1     Allergies Diclofenac sodium  Family History  Problem Relation Age of Onset  . Alcohol abuse Sister     ETOH dependence  . Alcohol abuse Brother     ETOH  dependence  . Heart attack Other   . Stroke Other   . Hypertension Other   . Coronary artery disease Other   . Hyperlipidemia Mother   . Heart disease Mother   . Hypertension Mother   . Hyperlipidemia Father   . Heart disease Father   . Hypertension Father     Social History Social History  Substance Use Topics  . Smoking status: Never Smoker   . Smokeless tobacco: Never Used  .  Alcohol Use: No    Review of Systems Constitutional: Persistent low-grade fever. Eyes: No visual changes. ENT:  sore throat, and headache. Also sinus drainage. Cardiovascular: Denies chest pain. Respiratory: Per history of present illness Gastrointestinal:  Per history of present illness Genitourinary: Negative for dysuria. Musculoskeletal: Negative for back pain. Skin: Negative for rash. Neurological: Negative for headaches, focal weakness or numbness. 10-point ROS otherwise negative.  ____________________________________________   PHYSICAL EXAM:  VITAL SIGNS: ED Triage Vitals  Enc Vitals Group     BP 01/24/16 1135 158/68 mmHg     Pulse Rate 01/24/16 1135 75     Resp 01/24/16 1135 18     Temp 01/24/16 1135 97.7 F (36.5 C)     Temp Source 01/24/16 1135 Oral     SpO2 01/24/16 1135 97 %     Weight 01/24/16 1135 210 lb (95.255 kg)     Height 01/24/16 1135 5\' 8"  (1.727 m)     Head Cir --      Peak Flow --      Pain Score 01/24/16 1138 7     Pain Loc --      Pain Edu? --      Excl. in [place]? --     Constitutional: Alert and oriented. Well appearing and in no acute distress. Eyes: Conjunctivae are normal. PERRL. EOMI. Ears:  Clear with normal landmarks. No erythema. Head: Atraumatic. Nose: No congestion/rhinnorhea. Mouth/Throat: Mucous membranes are moist.  Oropharynx non-erythematous. No lesions. Neck:  Supple.  No adenopathy.   Cardiovascular: Normal rate, regular rhythm. Grossly normal heart sounds.  Good peripheral circulation. Respiratory: Normal respiratory effort.  No retractions. Lungs CTAB. Neurologic:  Normal speech and language. No gross focal neurologic deficits are appreciated. No gait instability. Skin:  Skin is warm, dry and intact. No rash noted. Psychiatric: Mood and affect are normal. Speech and behavior are normal.  ____________________________________________   LABS (all labs ordered are listed, but only abnormal results are displayed)  Labs  Reviewed - No data to display ____________________________________________  EKG   ____________________________________________  RADIOLOGY   ____________________________________________   PROCEDURES  Procedure(s) performed: None  Critical Care performed: No  ____________________________________________   INITIAL IMPRESSION / ASSESSMENT AND PLAN / ED COURSE  Pertinent labs & imaging results that were available during my care of the patient were reviewed by me and considered in my medical decision making (see chart for details).  75 year old with persistent URI symptoms for over a week. He has a history of heart disease and hypertension. Concern for early bronchitis, possible sinusitis. Given Z-Pak and Hycodan cough syrup. He can follow-up with his primary physician if not improving. ____________________________________________   FINAL CLINICAL IMPRESSION(S) / ED DIAGNOSES  Final diagnoses:  Bronchitis  Sinusitis, unspecified chronicity, unspecified location      Mortimer Fries, PA-C 01/24/16 [place], MD 01/24/16 [phone]

## 2016-02-05 ENCOUNTER — Ambulatory Visit (INDEPENDENT_AMBULATORY_CARE_PROVIDER_SITE_OTHER): Payer: Commercial Managed Care - HMO | Admitting: Internal Medicine

## 2016-02-05 ENCOUNTER — Encounter: Payer: Self-pay | Admitting: Internal Medicine

## 2016-02-05 ENCOUNTER — Other Ambulatory Visit (INDEPENDENT_AMBULATORY_CARE_PROVIDER_SITE_OTHER): Payer: Commercial Managed Care - HMO

## 2016-02-05 VITALS — BP 140/76 | HR 67 | Temp 98.3°F | Resp 20 | Wt 206.0 lb

## 2016-02-05 DIAGNOSIS — E1159 Type 2 diabetes mellitus with other circulatory complications: Secondary | ICD-10-CM

## 2016-02-05 DIAGNOSIS — R7989 Other specified abnormal findings of blood chemistry: Secondary | ICD-10-CM

## 2016-02-05 DIAGNOSIS — R6889 Other general symptoms and signs: Secondary | ICD-10-CM

## 2016-02-05 DIAGNOSIS — Z0001 Encounter for general adult medical examination with abnormal findings: Secondary | ICD-10-CM | POA: Diagnosis not present

## 2016-02-05 DIAGNOSIS — I10 Essential (primary) hypertension: Secondary | ICD-10-CM

## 2016-02-05 DIAGNOSIS — J438 Other emphysema: Secondary | ICD-10-CM | POA: Diagnosis not present

## 2016-02-05 DIAGNOSIS — E785 Hyperlipidemia, unspecified: Secondary | ICD-10-CM

## 2016-02-05 DIAGNOSIS — E1165 Type 2 diabetes mellitus with hyperglycemia: Secondary | ICD-10-CM | POA: Diagnosis not present

## 2016-02-05 DIAGNOSIS — IMO0002 Reserved for concepts with insufficient information to code with codable children: Secondary | ICD-10-CM

## 2016-02-05 DIAGNOSIS — M545 Low back pain, unspecified: Secondary | ICD-10-CM

## 2016-02-05 DIAGNOSIS — G8929 Other chronic pain: Secondary | ICD-10-CM

## 2016-02-05 LAB — BASIC METABOLIC PANEL
BUN: 19 mg/dL (ref 6–23)
CALCIUM: 9.9 mg/dL (ref 8.4–10.5)
CHLORIDE: 102 meq/L (ref 96–112)
CO2: 35 mEq/L — ABNORMAL HIGH (ref 19–32)
CREATININE: 1.1 mg/dL (ref 0.40–1.50)
GFR: 84.13 mL/min (ref >60.00)
Glucose, Bld: 96 mg/dL (ref 70–99)
Potassium: 3.4 mEq/L — ABNORMAL LOW (ref 3.5–5.1)
Sodium: 143 mEq/L (ref 135–145)

## 2016-02-05 LAB — URINALYSIS, ROUTINE W REFLEX MICROSCOPIC
Bilirubin Urine: NEGATIVE
HGB URINE DIPSTICK: NEGATIVE
Ketones, ur: NEGATIVE
Nitrite: NEGATIVE
Specific Gravity, Urine: 1.02 (ref 1.000–1.030)
Total Protein, Urine: NEGATIVE
URINE GLUCOSE: NEGATIVE
UROBILINOGEN UA: 0.2 (ref 0.0–1.0)
pH: 5 (ref 5.0–8.0)

## 2016-02-05 LAB — CBC WITH DIFFERENTIAL/PLATELET
BASOS PCT: 0.3 % (ref 0.0–3.0)
Basophils Absolute: 0 10*3/uL (ref 0.0–0.1)
EOS PCT: 1.3 % (ref 0.0–5.0)
Eosinophils Absolute: 0.1 10*3/uL (ref 0.0–0.7)
HEMATOCRIT: 37.2 % — AB (ref 39.0–52.0)
Hemoglobin: 12.6 g/dL — ABNORMAL LOW (ref 13.0–17.0)
LYMPHS PCT: 30.6 % (ref 12.0–46.0)
Lymphs Abs: 2.4 10*3/uL (ref 0.7–4.0)
MCHC: 33.9 g/dL (ref 30.0–36.0)
MCV: 88.2 fl (ref 78.0–100.0)
MONOS PCT: 8.1 % (ref 3.0–12.0)
Monocytes Absolute: 0.6 10*3/uL (ref 0.1–1.0)
NEUTROS ABS: 4.7 10*3/uL (ref 1.4–7.7)
Neutrophils Relative %: 59.7 % (ref 43.0–77.0)
PLATELETS: 336 10*3/uL (ref 150.0–400.0)
RBC: 4.21 Mil/uL — ABNORMAL LOW (ref 4.22–5.81)
RDW: 14.1 % (ref 11.5–15.5)
WBC: 7.8 10*3/uL (ref 4.0–10.5)

## 2016-02-05 LAB — HEPATIC FUNCTION PANEL
ALT: 11 U/L (ref 0–53)
AST: 15 U/L (ref 0–37)
Albumin: 4 g/dL (ref 3.5–5.2)
Alkaline Phosphatase: 67 U/L (ref 39–117)
BILIRUBIN DIRECT: 0.1 mg/dL (ref 0.0–0.3)
BILIRUBIN TOTAL: 0.4 mg/dL (ref 0.2–1.2)
Total Protein: 7.4 g/dL (ref 6.0–8.3)

## 2016-02-05 LAB — LIPID PANEL
CHOL/HDL RATIO: 5
Cholesterol: 143 mg/dL (ref 0–200)
HDL: 29 mg/dL — AB (ref >39.00)
NONHDL: 113.7
TRIGLYCERIDES: 233 mg/dL — AB (ref 0.0–149.0)
VLDL: 46.6 mg/dL — ABNORMAL HIGH (ref 0.0–40.0)

## 2016-02-05 LAB — PSA: PSA: 3.38 ng/mL (ref 0.10–4.00)

## 2016-02-05 LAB — LDL CHOLESTEROL, DIRECT: Direct LDL: 67 mg/dL

## 2016-02-05 LAB — MICROALBUMIN / CREATININE URINE RATIO
CREATININE, U: 144.2 mg/dL
Microalb Creat Ratio: 0.6 mg/g (ref 0.0–30.0)
Microalb, Ur: 0.9 mg/dL (ref 0.0–1.9)

## 2016-02-05 LAB — TSH: TSH: 1.54 u[IU]/mL (ref 0.35–4.50)

## 2016-02-05 LAB — HEMOGLOBIN A1C: Hgb A1c MFr Bld: 6.5 % (ref 4.6–6.5)

## 2016-02-05 MED ORDER — TAMSULOSIN HCL 0.4 MG PO CAPS: 0.4000 mg | ORAL_CAPSULE | Freq: Every day | ORAL | Status: DC

## 2016-02-05 NOTE — Assessment & Plan Note (Signed)
stable overall by history and exam, recent data reviewed with pt, and pt to continue medical treatment as before,  to f/u any worsening symptoms or concerns BP Readings from Last 3 Encounters:  02/05/16 140/76  01/24/16 158/68  01/15/16 140/82

## 2016-02-05 NOTE — Patient Instructions (Addendum)
You had the steroid shot today   Please continue all other medications as before, and refills have been done if requested.  Please have the pharmacy call with any other refills you may need.  Please continue your efforts at being more active, low cholesterol diet, and weight control.  You are otherwise up to date with prevention measures today.  Please keep your appointments with your specialists as you may have planned  Please go to the LAB in the Basement (turn left off the elevator) for the tests to be done today  You will be contacted by phone if any changes need to be made immediately.  Otherwise, you will receive a letter about your results with an explanation, but please check with MyChart first.  Please remember to sign up for MyChart if you have not done so, as this will be important to you in the future with finding out test results, communicating by private email, and scheduling acute appointments online when needed.  Please return in 6 months, or sooner if needed, with Lab testing done 3-5 days before  

## 2016-02-05 NOTE — Progress Notes (Signed)
Pre visit review using our clinic review tool, if applicable. No additional management support is needed unless otherwise documented below in the visit note. 

## 2016-02-05 NOTE — Assessment & Plan Note (Signed)
stable overall by history and exam, recent data reviewed with pt, and pt to continue medical treatment as before,  to f/u any worsening symptoms or concerns SpO2 Readings from Last 3 Encounters:  02/05/16 94%  01/24/16 97%  08/07/15 98%

## 2016-02-05 NOTE — Progress Notes (Addendum)
Subjective:    Patient ID: Travis Carey, male    DOB: 1940/12/27, 75 y.o.   MRN: [id number]  HPI  Here for wellness and f/u;  Overall doing ok;  Pt denies Chest pain, worsening SOB, DOE, wheezing, orthopnea, PND, worsening LE edema, palpitations, dizziness or syncope.  Pt denies neurological change such as new headache, facial or extremity weakness.  Pt denies polydipsia, polyuria, or low sugar symptoms. Pt states overall good compliance with treatment and medications, good tolerability, and has been trying to follow appropriate diet.  Pt denies worsening depressive symptoms, suicidal ideation or panic. No fever, night sweats, wt loss, loss of appetite, or other constitutional symptoms.  Pt states good ability with ADL's, has low fall risk, home safety reviewed and adequate, no other significant changes in hearing or vision, and only occasionally active with exercise.  Just seen at ED apr 15 with sinus infection and cough, now resolved.  No other acute complaints, except Pt continues to have recurring LBP, but no bowel or bladder change, fever, wt loss,  worsening LE pain/numbness/weakness, gait change or falls, just more severe in last few wks, asks for repeat depomedrol as this has helped several times in the past Past Medical History  Diagnosis Date  . VITAMIN B12 DEFICIENCY 02/09/2010  . GLUCOSE INTOLERANCE 01/28/2010  . HYPERLIPIDEMIA 02/07/2008  . HYPOKALEMIA 01/28/2010  . ANEMIA-NOS 01/28/2010  . ERECTILE DYSFUNCTION 02/24/2009  . DEPRESSION 06/12/2007  . Carpal tunnel syndrome 05/20/2008  . HYPERTENSION 06/09/2007  . CAD 02/17/2010  . HEMORRHOIDS 06/09/2007  . URI 08/19/2008  . GERD 06/09/2007  . SHOULDER PAIN, LEFT 04/25/2008  . LOW BACK PAIN 06/12/2007  . PARESTHESIA 04/25/2008  . CAROTID BRUIT 11/18/2009  . DYSPNEA 07/28/2010  . CHEST PAIN 09/26/2009  . MYOCARDIAL PERFUSION SCAN, WITH STRESS TEST, ABNORMAL 10/08/2009  . Impaired glucose tolerance 01/23/2011  . Cervical radiculitis 04/17/2012  .  Allergic rhinitis, cause unspecified 01/31/2013  . BPH (benign prostatic hypertrophy) 04/25/2013  . GERD (gastroesophageal reflux disease)   . History of colonic polyps 08/07/2015   Past Surgical History  Procedure Laterality Date  . Coronary stent placement      from right radial  . Colonoscopy      reports that he has never smoked. He has never used smokeless tobacco. He reports that he does not drink alcohol or use illicit drugs. family history includes Alcohol abuse in his brother and sister; Coronary artery disease in his other; Heart attack in his other; Heart disease in his father and mother; Hyperlipidemia in his father and mother; Hypertension in his father, mother, and other; Stroke in his other. Allergies  Allergen Reactions  . Diclofenac Sodium     REACTION: maks pt drowsey   Current Outpatient Prescriptions on File Prior to Visit  Medication Sig Dispense Refill  . amLODipine (NORVASC) 10 MG tablet Take 1 tablet (10 mg total) by mouth daily. 90 tablet 3  . aspirin EC 81 MG tablet Take 81 mg by mouth daily.    . carvedilol (COREG) 12.5 MG tablet Take 1 tablet (12.5 mg total) by mouth 2 (two) times daily. 180 tablet 3  . clopidogrel (PLAVIX) 75 MG tablet Take 1 tablet (75 mg total) by mouth daily. 90 tablet 3  . colchicine 0.6 MG tablet Take 1 tablet (0.6 mg total) by mouth daily. (Patient taking differently: Take 0.6 mg by mouth as needed. ) 90 tablet 3  . dexlansoprazole (DEXILANT) 60 MG capsule Take 1 capsule (60 mg total)  by mouth daily. 90 capsule 3  . fexofenadine (ALLEGRA) 180 MG tablet Take 1 tablet (180 mg total) by mouth daily. As needed (Patient taking differently: Take 180 mg by mouth daily as needed for allergies. ) 90 tablet 3  . fluticasone (FLONASE) 50 MCG/ACT nasal spray USE TWO SPRAYS IN EACH NOSTRIL ONCE DAILY 16 g 5  . HYDROcodone-acetaminophen (NORCO/VICODIN) 5-325 MG tablet Take 1 tablet by mouth every 6 (six) hours as needed for moderate pain. 60 tablet 0    . lisinopril-hydrochlorothiazide (PRINZIDE,ZESTORETIC) 10-12.5 MG per tablet Take 1 tablet by mouth 2 (two) times daily.    . meloxicam (MOBIC) 7.5 MG tablet TAKE 1 TO 2 TABLETS ONE TIME DAILY AS NEEDED FOR PAIN 180 tablet 1  . omeprazole (PRILOSEC) 20 MG capsule Take 1 capsule (20 mg total) by mouth daily. 30 capsule 3  . potassium chloride (K-DUR) 10 MEQ tablet Take 2 tablets (20 mEq total) by mouth daily. 180 tablet 1  . rosuvastatin (CRESTOR) 10 MG tablet Take 1 tablet (10 mg total) by mouth daily. 90 tablet 1   No current facility-administered medications on file prior to visit.    Review of Systems Constitutional: Negative for increased diaphoresis, or other activity, appetite or siginficant weight change other than noted HENT: Negative for worsening hearing loss, ear pain, facial swelling, mouth sores and neck stiffness.   Eyes: Negative for other worsening pain, redness or visual disturbance.  Respiratory: Negative for choking or stridor Cardiovascular: Negative for other chest pain and palpitations.  Gastrointestinal: Negative for worsening diarrhea, blood in stool, or abdominal distention Genitourinary: Negative for hematuria, flank pain or change in urine volume.  Musculoskeletal: Negative for myalgias or other joint complaints.  Skin: Negative for other color change and wound or drainage.  Neurological: Negative for syncope and numbness. other than noted Hematological: Negative for adenopathy. or other swelling Psychiatric/Behavioral: Negative for hallucinations, SI, self-injury, decreased concentration or other worsening agitation.      Objective:   Physical Exam BP 140/76 mmHg  Pulse 67  Temp(Src) 98.3 F (36.8 C) (Oral)  Resp 20  Wt 206 lb (93.441 kg)  SpO2 94% VS noted,  Constitutional: Pt is oriented to person, place, and time. Appears well-developed and well-nourished, in no significant distress Head: Normocephalic and atraumatic  Eyes: Conjunctivae and EOM are  normal. Pupils are equal, round, and reactive to light Right Ear: External ear normal.  Left Ear: External ear normal Nose: Nose normal.  Mouth/Throat: Oropharynx is clear and moist  Neck: Normal range of motion. Neck supple. No JVD present. No tracheal deviation present or significant neck LA or mass Cardiovascular: Normal rate, regular rhythm, normal heart sounds and intact distal pulses.   Pulmonary/Chest: Effort normal and breath sounds without rales or wheezing  Abdominal: Soft. Bowel sounds are normal. NT. No HSM  Musculoskeletal: Normal range of motion. Exhibits no edema Lymphadenopathy: Has no cervical adenopathy.  Neurological: Pt is alert and oriented to person, place, and time. Pt has normal reflexes. No cranial nerve deficit. Motor grossly intact Skin: Skin is warm and dry. No rash noted or new ulcers Spine nontender, no significant bilat lumbar paravertebral tender Psychiatric:  Has normal mood and affect. Behavior is normal.     Assessment & Plan:

## 2016-02-05 NOTE — Assessment & Plan Note (Signed)
Mild worsening flare, c/w prob underlying DJD or DDD flare, for depomedrol IM, exam o/w stable

## 2016-02-05 NOTE — Assessment & Plan Note (Signed)

## 2016-02-05 NOTE — Assessment & Plan Note (Signed)
stable overall by history and exam, recent data reviewed with pt, and pt to continue medical treatment as before,  to f/u any worsening symptoms or concerns Lab Results  Component Value Date   [place] 64 04/08/2015   For f/u lab

## 2016-02-05 NOTE — Assessment & Plan Note (Addendum)
stable overall by history and exam, recent data reviewed with pt, and pt to continue medical treatment as before,  to f/u any worsening symptoms or concerns Lab Results  Component Value Date   HGBA1C 6.2 08/07/2015  In addition to the time spent performing CPE, I spent an additional 40 minutes face to face,in which greater than 50% of this time was spent in counseling and coordination of care for patient's acute illness as documented.

## 2016-02-17 ENCOUNTER — Other Ambulatory Visit: Payer: Self-pay | Admitting: Internal Medicine

## 2016-03-04 ENCOUNTER — Telehealth: Payer: Self-pay | Admitting: Cardiovascular Disease

## 2016-03-04 DIAGNOSIS — R079 Chest pain, unspecified: Secondary | ICD-10-CM

## 2016-03-04 NOTE — Telephone Encounter (Signed)
Patient is ready to schedule stress test .   Please call to schedule.

## 2016-03-05 NOTE — Telephone Encounter (Signed)
"  Atypical type chest pain, possibly secondary to GERD He is a nonsmoker, no diabetes, cholesterol at goal Recommended he start PPI for his GERD symptoms  If no improvement in his symptoms in the next 2 weeks, recommended he call the office for a stress test "  What type of stress test do you want him to have?

## 2016-03-05 NOTE — Telephone Encounter (Signed)
He would need a Myoview, Likely unable to treadmill Would need pharmacologic study

## 2016-03-09 NOTE — Telephone Encounter (Signed)
Pt sched for Lexiscan 03/15/16 @ 8:00. Reviewed instructions will mail to pt, as well.

## 2016-03-09 NOTE — Telephone Encounter (Signed)
Attempted to contact pt. No answer, vm box not set up yet.  Unable to leave message.

## 2016-03-15 ENCOUNTER — Encounter
Admission: RE | Admit: 2016-03-15 | Discharge: 2016-03-15 | Disposition: A | Discharge status: Home / Self Care | Payer: Commercial Managed Care - HMO | Source: Ambulatory Visit | Attending: Cardiovascular Disease | Admitting: Cardiovascular Disease

## 2016-03-15 DIAGNOSIS — R079 Chest pain, unspecified: Secondary | ICD-10-CM

## 2016-03-15 LAB — NM MYOCAR MULTI W/SPECT W/WALL MOTION / EF
CHL CUP NUCLEAR SRS: 6
CHL CUP NUCLEAR SSS: 0
CHL CUP STRESS STAGE 1 GRADE: 0 %
CHL CUP STRESS STAGE 1 SPEED: 0 mph
CHL CUP STRESS STAGE 2 GRADE: 0 %
CHL CUP STRESS STAGE 2 SPEED: 0 mph
CHL CUP STRESS STAGE 3 GRADE: 0 %
CHL CUP STRESS STAGE 3 SPEED: 0 mph
CHL CUP STRESS STAGE 4 HR: 82 {beats}/min
Estimated workload: 1 METS
LVDIAVOL: 83 mL (ref 62–150)
LVSYSVOL: 40 mL
Peak HR: 95 {beats}/min
Percent of predicted max HR: 65 %
SDS: 0
Stage 1 HR: 68 {beats}/min
Stage 2 HR: 68 {beats}/min
Stage 3 HR: 95 {beats}/min
Stage 4 DBP: 72 mmHg
Stage 4 Grade: 0 %
Stage 4 SBP: 161 mmHg
Stage 4 Speed: 0 mph
TID: 0.96

## 2016-03-15 MED ORDER — REGADENOSON 0.4 MG/5ML IV SOLN
0.4000 mg | Freq: Once | INTRAVENOUS | Status: AC
  Administered 2016-03-15: 0.4 mg via INTRAVENOUS

## 2016-03-15 MED ORDER — TECHNETIUM TC 99M TETROFOSMIN IV KIT
30.0000 | PACK | Freq: Once | INTRAVENOUS | Status: AC | PRN
  Administered 2016-03-15: 31.678 via INTRAVENOUS

## 2016-03-15 MED ORDER — TECHNETIUM TC 99M TETROFOSMIN IV KIT
13.0000 | PACK | Freq: Once | INTRAVENOUS | Status: AC | PRN
  Administered 2016-03-15: 12.756 via INTRAVENOUS

## 2016-03-17 ENCOUNTER — Emergency Department
Admission: EM | Admit: 2016-03-17 | Discharge: 2016-03-17 | Disposition: A | Discharge status: Home / Self Care | Payer: Commercial Managed Care - HMO | Attending: Emergency Medicine | Admitting: Emergency Medicine

## 2016-03-17 ENCOUNTER — Encounter: Payer: Self-pay | Admitting: Emergency Medicine

## 2016-03-17 ENCOUNTER — Emergency Department: Payer: Commercial Managed Care - HMO

## 2016-03-17 DIAGNOSIS — I2510 Atherosclerotic heart disease of native coronary artery without angina pectoris: Secondary | ICD-10-CM | POA: Diagnosis not present

## 2016-03-17 DIAGNOSIS — J449 Chronic obstructive pulmonary disease, unspecified: Secondary | ICD-10-CM | POA: Insufficient documentation

## 2016-03-17 DIAGNOSIS — I10 Essential (primary) hypertension: Secondary | ICD-10-CM | POA: Diagnosis not present

## 2016-03-17 DIAGNOSIS — Z7951 Long term (current) use of inhaled steroids: Secondary | ICD-10-CM | POA: Insufficient documentation

## 2016-03-17 DIAGNOSIS — M545 Low back pain, unspecified: Secondary | ICD-10-CM

## 2016-03-17 DIAGNOSIS — Z79899 Other long term (current) drug therapy: Secondary | ICD-10-CM | POA: Insufficient documentation

## 2016-03-17 DIAGNOSIS — R109 Unspecified abdominal pain: Secondary | ICD-10-CM | POA: Diagnosis present

## 2016-03-17 DIAGNOSIS — Z7982 Long term (current) use of aspirin: Secondary | ICD-10-CM | POA: Diagnosis not present

## 2016-03-17 DIAGNOSIS — Z8673 Personal history of transient ischemic attack (TIA), and cerebral infarction without residual deficits: Secondary | ICD-10-CM | POA: Diagnosis not present

## 2016-03-17 DIAGNOSIS — Z7902 Long term (current) use of antithrombotics/antiplatelets: Secondary | ICD-10-CM | POA: Insufficient documentation

## 2016-03-17 DIAGNOSIS — E1165 Type 2 diabetes mellitus with hyperglycemia: Secondary | ICD-10-CM | POA: Diagnosis not present

## 2016-03-17 DIAGNOSIS — F329 Major depressive disorder, single episode, unspecified: Secondary | ICD-10-CM | POA: Insufficient documentation

## 2016-03-17 DIAGNOSIS — E876 Hypokalemia: Secondary | ICD-10-CM

## 2016-03-17 DIAGNOSIS — E785 Hyperlipidemia, unspecified: Secondary | ICD-10-CM | POA: Insufficient documentation

## 2016-03-17 DIAGNOSIS — Z955 Presence of coronary angioplasty implant and graft: Secondary | ICD-10-CM | POA: Insufficient documentation

## 2016-03-17 DIAGNOSIS — K5790 Diverticulosis of intestine, part unspecified, without perforation or abscess without bleeding: Secondary | ICD-10-CM | POA: Diagnosis not present

## 2016-03-17 LAB — URINALYSIS COMPLETE WITH MICROSCOPIC (ARMC ONLY)
Bacteria, UA: NONE SEEN
Bilirubin Urine: NEGATIVE
Glucose, UA: NEGATIVE mg/dL
HGB URINE DIPSTICK: NEGATIVE
KETONES UR: NEGATIVE mg/dL
Leukocytes, UA: NEGATIVE
NITRITE: NEGATIVE
PH: 6 (ref 5.0–8.0)
PROTEIN: NEGATIVE mg/dL
Specific Gravity, Urine: 1.012 (ref 1.005–1.030)

## 2016-03-17 LAB — COMPREHENSIVE METABOLIC PANEL
ALT: 19 U/L (ref 17–63)
ANION GAP: 8 (ref 5–15)
AST: 28 U/L (ref 15–41)
Albumin: 3.9 g/dL (ref 3.5–5.0)
Alkaline Phosphatase: 56 U/L (ref 38–126)
BUN: 14 mg/dL (ref 6–20)
CALCIUM: 9.7 mg/dL (ref 8.9–10.3)
CHLORIDE: 100 mmol/L — AB (ref 101–111)
CO2: 28 mmol/L (ref 22–32)
CREATININE: 1.2 mg/dL (ref 0.61–1.24)
GFR, EST NON AFRICAN AMERICAN: 58 mL/min — AB (ref >60)
Glucose, Bld: 204 mg/dL — ABNORMAL HIGH (ref 65–99)
Potassium: 2.9 mmol/L — CL (ref 3.5–5.1)
SODIUM: 136 mmol/L (ref 135–145)
Total Bilirubin: 0.5 mg/dL (ref 0.3–1.2)
Total Protein: 7.3 g/dL (ref 6.5–8.1)

## 2016-03-17 LAB — CBC WITH DIFFERENTIAL/PLATELET
BASOS PCT: 0 %
Basophils Absolute: 0 10*3/uL (ref 0–0.1)
EOS ABS: 0.1 10*3/uL (ref 0–0.7)
Eosinophils Relative: 2 %
HEMATOCRIT: 36.9 % — AB (ref 40.0–52.0)
HEMOGLOBIN: 12.6 g/dL — AB (ref 13.0–18.0)
LYMPHS ABS: 1.9 10*3/uL (ref 1.0–3.6)
Lymphocytes Relative: 32 %
MCH: 30 pg (ref 26.0–34.0)
MCHC: 34.2 g/dL (ref 32.0–36.0)
MCV: 87.8 fL (ref 80.0–100.0)
Monocytes Absolute: 0.4 10*3/uL (ref 0.2–1.0)
Monocytes Relative: 6 %
NEUTROS ABS: 3.5 10*3/uL (ref 1.4–6.5)
NEUTROS PCT: 60 %
Platelets: 204 10*3/uL (ref 150–440)
RBC: 4.2 MIL/uL — AB (ref 4.40–5.90)
RDW: 14.7 % — ABNORMAL HIGH (ref 11.5–14.5)
WBC: 5.9 10*3/uL (ref 3.8–10.6)

## 2016-03-17 MED ORDER — KETOROLAC TROMETHAMINE 30 MG/ML IJ SOLN
10.0000 mg | Freq: Once | INTRAMUSCULAR | Status: AC
  Administered 2016-03-17: 9.9 mg via INTRAVENOUS
  Filled 2016-03-17: qty 1

## 2016-03-17 MED ORDER — POTASSIUM CHLORIDE CRYS ER 20 MEQ PO TBCR
40.0000 meq | EXTENDED_RELEASE_TABLET | Freq: Once | ORAL | Status: AC
  Administered 2016-03-17: 40 meq via ORAL
  Filled 2016-03-17: qty 2

## 2016-03-17 NOTE — Discharge Instructions (Signed)
Increase your potassium to 40 mEq once a day as your potassium was low today at 2.9. See your doctor next week and have your symptoms rechecked; also have your potassium & blood sugar rechecked as it was somewhat high today at 204. Try Tylenol and/or Advil over-the-counter according to package instructions as needed for pain. Return to the emergency department for new or worsening pain, vomiting, fever, chest pain, difficulty breathing or for any other concerns.    Back Injury Prevention Back injuries can be very painful. They can also be difficult to heal. After having one back injury, you are more likely to injure your back again. It is important to learn how to avoid injuring or re-injuring your back. The following tips can help you to prevent a back injury. WHAT SHOULD I KNOW ABOUT PHYSICAL FITNESS? 1. Exercise for 30 minutes per day on most days of the week or as directed by your health care provider. Make sure to: 1. Do aerobic exercises, such as walking, jogging, biking, or swimming. 2. Do exercises that increase balance and strength, such as tai chi and yoga. These can decrease your risk of falling and injuring your back. 3. Do stretching exercises to help with flexibility. 4. Try to develop strong abdominal muscles. Your abdominal muscles provide a lot of the support that is needed by your back. 2. Maintain a healthy weight. This helps to decrease your risk of a back injury. WHAT SHOULD I KNOW ABOUT MY DIET? 1. Talk with your health care provider about your overall diet. Take supplements and vitamins only as directed by your health care provider. 2. Talk with your health care provider about how much calcium and vitamin D you need each day. These nutrients help to prevent weakening of the bones (osteoporosis). Osteoporosis can cause broken (fractured) bones, which lead to back pain. 3. Include good sources of calcium in your diet, such as dairy products, green leafy vegetables, and products  that have had calcium added to them (fortified). 4. Include good sources of vitamin D in your diet, such as milk and foods that are fortified with vitamin D. WHAT SHOULD I KNOW ABOUT MY POSTURE? 1. Sit up straight and stand up straight. Avoid leaning forward when you sit or hunching over when you stand. 2. Choose chairs that have good low-back (lumbar) support. 3. If you work at a desk, sit close to it so you do not need to lean over. Keep your chin tucked in. Keep your neck drawn back, and keep your elbows bent at a right angle. Your arms should look like the letter "L." 4. Sit high and close to the steering wheel when you drive. Add a lumbar support to your car seat, if needed. 5. Avoid sitting or standing in one position for very long. Take breaks to get up, stretch, and walk around at least one time every hour. Take breaks every hour if you are driving for long periods of time. 6. Sleep on your side with your knees slightly bent, or sleep on your back with a pillow under your knees. Do not lie on the front of your body to sleep. WHAT SHOULD I KNOW ABOUT LIFTING, TWISTING, AND REACHING? Lifting and Heavy Lifting 1. Avoid heavy lifting, especially repetitive heavy lifting. If you must do heavy lifting: 1. Stretch before lifting. 2. Work slowly. 3. Rest between lifts. 4. Use a tool such as a cart or a dolly to move objects if one is available. 5. Make several small trips instead  of carrying one heavy load. 6. Ask for help when you need it, especially when moving big objects. 2. Follow these steps when lifting: 1. Stand with your feet shoulder-width apart. 2. Get as close to the object as you can. Do not try to pick up a heavy object that is far from your body. 3. Use handles or lifting straps if they are available. 4. Bend at your knees. Squat down, but keep your heels off the floor. 5. Keep your shoulders pulled back, your chin tucked in, and your back straight. 6. Lift the object slowly  while you tighten the muscles in your legs, abdomen, and buttocks. Keep the object as close to the center of your body as possible. 3. Follow these steps when putting down a heavy load: 1. Stand with your feet shoulder-width apart. 2. Lower the object slowly while you tighten the muscles in your legs, abdomen, and buttocks. Keep the object as close to the center of your body as possible. 3. Keep your shoulders pulled back, your chin tucked in, and your back straight. 4. Bend at your knees. Squat down, but keep your heels off the floor. 5. Use handles or lifting straps if they are available. Twisting and Reaching 1. Avoid lifting heavy objects above your waist. 2. Do not twist at your waist while you are lifting or carrying a load. If you need to turn, move your feet. 3. Do not bend over without bending at your knees. 4. Avoid reaching over your head, across a table, or for an object on a high surface. WHAT ARE SOME OTHER TIPS? 1. Avoid wet floors and icy ground. Keep sidewalks clear of ice to prevent falls. 2. Do not sleep on a mattress that is too soft or too hard. 3. Keep items that are used frequently within easy reach. 4. Put heavier objects on shelves at waist level, and put lighter objects on lower or higher shelves. 5. Find ways to decrease your stress, such as exercise, massage, or relaxation techniques. Stress can build up in your muscles. Tense muscles are more vulnerable to injury. 6. Talk with your health care provider if you feel anxious or depressed. These conditions can make back pain worse. 7. Wear flat heel shoes with cushioned soles. 8. Avoid sudden movements. 9. Use both shoulder straps when carrying a backpack. 10. Do not use any tobacco products, including cigarettes, chewing tobacco, or electronic cigarettes. If you need help quitting, ask your health care provider.   This information is not intended to replace advice given to you by your health care provider. Make sure  you discuss any questions you have with your health care provider.   Document Released: 11/04/2004 Document Revised: 02/11/2015 Document Reviewed: 10/01/2014 Elsevier Interactive Patient Education 2016 [employer]  Back Exercises The following exercises strengthen the muscles that help to support the back. They also help to keep the lower back flexible. Doing these exercises can help to prevent back pain or lessen existing pain. If you have back pain or discomfort, try doing these exercises 2-3 times each day or as told by your health care provider. When the pain goes away, do them once each day, but increase the number of times that you repeat the steps for each exercise (do more repetitions). If you do not have back pain or discomfort, do these exercises once each day or as told by your health care provider. EXERCISES Single Knee to Chest Repeat these steps 3-5 times for each leg: 3. Lie on  your back on a firm bed or the floor with your legs extended. 4. Bring one knee to your chest. Your other leg should stay extended and in contact with the floor. 5. Hold your knee in place by grabbing your knee or thigh. 6. Pull on your knee until you feel a gentle stretch in your lower back. 7. Hold the stretch for 10-30 seconds. 8. Slowly release and straighten your leg. Pelvic Tilt Repeat these steps 5-10 times: 5. Lie on your back on a firm bed or the floor with your legs extended. 6. Bend your knees so they are pointing toward the ceiling and your feet are flat on the floor. 7. Tighten your lower abdominal muscles to press your lower back against the floor. This motion will tilt your pelvis so your tailbone points up toward the ceiling instead of pointing to your feet or the floor. 8. With gentle tension and even breathing, hold this position for 5-10 seconds. Cat-Cow Repeat these steps until your lower back becomes more flexible: 7. Get into a hands-and-knees position on a firm surface. Keep  your hands under your shoulders, and keep your knees under your hips. You may place padding under your knees for comfort. 8. Let your head hang down, and point your tailbone toward the floor so your lower back becomes rounded like the back of a cat. 9. Hold this position for 5 seconds. 10. Slowly lift your head and point your tailbone up toward the ceiling so your back forms a sagging arch like the back of a cow. 11. Hold this position for 5 seconds. Press-Ups Repeat these steps 5-10 times: 4. Lie on your abdomen (face-down) on the floor. 5. Place your palms near your head, about shoulder-width apart. 6. While you keep your back as relaxed as possible and keep your hips on the floor, slowly straighten your arms to raise the top half of your body and lift your shoulders. Do not use your back muscles to raise your upper torso. You may adjust the placement of your hands to make yourself more comfortable. 7. Hold this position for 5 seconds while you keep your back relaxed. 8. Slowly return to lying flat on the floor. Bridges Repeat these steps 10 times: 5. Lie on your back on a firm surface. 6. Bend your knees so they are pointing toward the ceiling and your feet are flat on the floor. 7. Tighten your buttocks muscles and lift your buttocks off of the floor until your waist is at almost the same height as your knees. You should feel the muscles working in your buttocks and the back of your thighs. If you do not feel these muscles, slide your feet 1-2 inches farther away from your buttocks. 8. Hold this position for 3-5 seconds. 9. Slowly lower your hips to the starting position, and allow your buttocks muscles to relax completely. If this exercise is too easy, try doing it with your arms crossed over your chest. Abdominal Crunches Repeat these steps 5-10 times: 11. Lie on your back on a firm bed or the floor with your legs extended. 12. Bend your knees so they are pointing toward the ceiling and  your feet are flat on the floor. 106. Cross your arms over your chest. 14. Tip your chin slightly toward your chest without bending your neck. 43. Tighten your abdominal muscles and slowly raise your trunk (torso) high enough to lift your shoulder blades a tiny bit off of the floor. Avoid raising your torso  higher than that, because it can put too much stress on your low back and it does not help to strengthen your abdominal muscles. 16. Slowly return to your starting position. Back Lifts Repeat these steps 5-10 times: 1. Lie on your abdomen (face-down) with your arms at your sides, and rest your forehead on the floor. 2. Tighten the muscles in your legs and your buttocks. 3. Slowly lift your chest off of the floor while you keep your hips pressed to the floor. Keep the back of your head in line with the curve in your back. Your eyes should be looking at the floor. 4. Hold this position for 3-5 seconds. 5. Slowly return to your starting position. SEEK MEDICAL CARE IF:  Your back pain or discomfort gets much worse when you do an exercise.  Your back pain or discomfort does not lessen within 2 hours after you exercise. If you have any of these problems, stop doing these exercises right away. Do not do them again unless your health care provider says that you can. SEEK IMMEDIATE MEDICAL CARE IF:  You develop sudden, severe back pain. If this happens, stop doing the exercises right away. Do not do them again unless your health care provider says that you can.   This information is not intended to replace advice given to you by your health care provider. Make sure you discuss any questions you have with your health care provider.   Document Released: 11/04/2004 Document Revised: 06/18/2015 Document Reviewed: 11/21/2014 Elsevier Interactive Patient Education [employer].

## 2016-03-17 NOTE — ED Notes (Signed)
Pt presents with right side abd pain and swelling since yesterday. Pt denies any n/v/d.

## 2016-03-17 NOTE — ED Provider Notes (Signed)
[facility] Emergency Department Provider Note ____________________________________________  Time seen: Approximately 8:27 AM  I have reviewed the triage vital signs and the nursing notes.   HISTORY  Chief Complaint Abdominal Pain  HPI Travis Carey is a 75 y.o. 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  It may be somewhat worse with moving around.  At worst, it has been a 7/10, now is a 4/10.  He has tried [place] but nothing oral.  It is nota associated with nausea, chest pain, shortness of breath, dysuria, hematuria. He has been having constipation recently but took a laxative the day before yesterday and had several bowel movements yesterday which were normal. He has worked for a Dealer for many years and has not had this problem in the past.  Past Medical History  Diagnosis Date  . VITAMIN B12 DEFICIENCY 02/09/2010  . GLUCOSE INTOLERANCE 01/28/2010  . HYPERLIPIDEMIA 02/07/2008  . HYPOKALEMIA 01/28/2010  . ANEMIA-NOS 01/28/2010  . ERECTILE DYSFUNCTION 02/24/2009  . DEPRESSION 06/12/2007  . Carpal tunnel syndrome 05/20/2008  . HYPERTENSION 06/09/2007  . CAD 02/17/2010  . HEMORRHOIDS 06/09/2007  . URI 08/19/2008  . GERD 06/09/2007  . SHOULDER PAIN, LEFT 04/25/2008  . LOW BACK PAIN 06/12/2007  . PARESTHESIA 04/25/2008  . CAROTID BRUIT 11/18/2009  . DYSPNEA 07/28/2010  . CHEST PAIN 09/26/2009  . MYOCARDIAL PERFUSION SCAN, WITH STRESS TEST, ABNORMAL 10/08/2009  . Impaired glucose tolerance 01/23/2011  . Cervical radiculitis 04/17/2012  . Allergic rhinitis, cause unspecified 01/31/2013  . BPH (benign prostatic hypertrophy) 04/25/2013  . GERD (gastroesophageal reflux disease)   . History of colonic polyps  08/07/2015    Patient Active Problem List   Diagnosis Date Noted  . History of colonic polyps 08/07/2015  . CVA (cerebral infarction) 04/07/2015  . Rhinitis, allergic 03/29/2015  . Diabetes type 2, uncontrolled ([place]) 03/29/2015  . Right arm pain 11/27/2014  . Epididymal cyst 06/12/2014  . Left wrist pain 11/01/2013  . Right wrist pain 11/01/2013  . Nocturia 11/01/2013  . Encounter for preventative adult health care exam with abnormal findings 11/01/2013  . Obese 09/12/2013  . BPH (benign prostatic hypertrophy) 04/25/2013  . Knee pain, bilateral 04/25/2013  . Chest pain 01/31/2013  . Eustachian tube dysfunction 01/31/2013  . Cough 01/31/2013  . Allergic rhinitis 01/31/2013  . Cervical radiculitis 04/17/2012  . Cervical disc disease 04/17/2012  . Paresthesia of arm 12/08/2011  . Hypertension 12/08/2011  . Myalgia 11/05/2011  . B12 deficiency 11/05/2011  . Fatigue 07/27/2011  . COPD (chronic obstructive pulmonary disease) ([place]) 01/26/2011  . Left knee pain 01/26/2011  . Coronary atherosclerosis 02/17/2010  . VITAMIN B12 DEFICIENCY 02/09/2010  . ANEMIA-NOS 01/28/2010  . CAROTID BRUIT 11/18/2009  . ERECTILE DYSFUNCTION 02/24/2009  . Carpal tunnel syndrome 05/20/2008  . PARESTHESIA 04/25/2008  . Hyperlipidemia 02/07/2008  . DEPRESSION 06/12/2007  . LOW BACK PAIN 06/12/2007  . GERD 06/09/2007    Past Surgical History  Procedure Laterality Date  . Coronary stent placement      from right radial  . Colonoscopy      Current Outpatient Rx  Name  Route  Sig  Dispense  Refill  . amLODipine (NORVASC) 10 MG tablet  Oral   Take 1 tablet (10 mg total) by mouth daily.   90 tablet   3   . aspirin EC 81 MG tablet   Oral   Take 81 mg by mouth daily.         . carvedilol (COREG) 12.5 MG tablet   Oral   Take 1 tablet (12.5 mg total) by mouth 2 (two) times daily.   180 tablet   3   . clopidogrel (PLAVIX) 75 MG tablet   Oral   Take 1 tablet (75 mg total) by mouth  daily.   90 tablet   3   . colchicine 0.6 MG tablet   Oral   Take 1 tablet (0.6 mg total) by mouth daily. Patient taking differently: Take 0.6 mg by mouth as needed.    90 tablet   3   . dexlansoprazole (DEXILANT) 60 MG capsule   Oral   Take 1 capsule (60 mg total) by mouth daily.   90 capsule   3   . fexofenadine (ALLEGRA) 180 MG tablet   Oral   Take 1 tablet (180 mg total) by mouth daily. As needed Patient taking differently: Take 180 mg by mouth daily as needed for allergies.    90 tablet   3   . fluticasone (FLONASE) 50 MCG/ACT nasal spray      USE TWO SPRAYS IN EACH NOSTRIL ONCE DAILY   16 g   5   . HYDROcodone-acetaminophen (NORCO/VICODIN) 5-325 MG tablet   Oral   Take 1 tablet by mouth every 6 (six) hours as needed for moderate pain.   60 tablet   0   . lisinopril-hydrochlorothiazide (PRINZIDE,ZESTORETIC) 10-12.5 MG per tablet   Oral   Take 1 tablet by mouth 2 (two) times daily.         . meloxicam (MOBIC) 7.5 MG tablet      TAKE 1 TO 2 TABLETS ONE TIME DAILY AS NEEDED FOR PAIN   180 tablet   1   . omeprazole (PRILOSEC) 20 MG capsule   Oral   Take 1 capsule (20 mg total) by mouth daily.   30 capsule   3   . potassium chloride (K-DUR) 10 MEQ tablet   Oral   Take 2 tablets (20 mEq total) by mouth daily.   180 tablet   1   . rosuvastatin (CRESTOR) 10 MG tablet      TAKE 1 TABLET EVERY DAY   90 tablet   1   . tamsulosin (FLOMAX) 0.4 MG CAPS capsule   Oral   Take 1 capsule (0.4 mg total) by mouth daily.   90 capsule   3     Allergies Diclofenac sodium causes sedation; patient feels that he would be safe to try Toradol  Family History  Problem Relation Age of Onset  . Alcohol abuse Sister     ETOH dependence  . Alcohol abuse Brother     ETOH  dependence  . Heart attack Other   . Stroke Other   . Hypertension Other   . Coronary artery disease Other   . Hyperlipidemia Mother   . Heart disease Mother   . Hypertension Mother   .  Hyperlipidemia Father   . Heart disease Father   . Hypertension Father     Social History Social History  Substance Use Topics  . Smoking status: Never Smoker   . Smokeless tobacco: Never Used  . Alcohol Use: No    Review of Systems Constitutional:  No fever/chills Eyes: No visual changes. ENT: No sore throat. Cardiovascular: Denies chest pain. Respiratory: Denies shortness of breath. Gastrointestinal: See history of present illness. Genitourinary: Negative for dysuria. Musculoskeletal: See history of present illness Skin: Negative for rash. Neurological: Negative for headaches.  10-point ROS otherwise negative.  ____________________________________________   PHYSICAL EXAM:  VITAL SIGNS: ED Triage Vitals  Enc Vitals Group     BP 03/17/16 0804 154/73 mmHg     Pulse Rate 03/17/16 0804 69     Resp 03/17/16 0804 20     Temp --      Temp src --      SpO2 03/17/16 0804 97 %     Weight 03/17/16 0804 200 lb (90.719 kg)     Height 03/17/16 0804 5\' 8"  (1.727 m)     Head Cir --      Peak Flow --      Pain Score 03/17/16 0805 7     Pain Loc --      Pain Edu? --      Excl. in [place]? --    Constitutional: Alert and oriented. Well appearing and in no acute distress. Eyes: Conjunctivae are normal. PERRL. EOMI. Head: Atraumatic. Nose: No congestion/rhinnorhea. Mouth/Throat: Mucous membranes are moist.  Oropharynx non-erythematous. Neck: No stridor.   Cardiovascular: Normal rate, regular rhythm. Grossly normal heart sounds.  Good peripheral circulation. Respiratory: Normal respiratory effort.  No retractions. Lungs CTAB. Gastrointestinal: Soft. No distention. No abdominal bruits. mild right flank tenderness to palpation and right side tenderness to palpation Musculoskeletal: No lower extremity tenderness nor edema.   Neurologic:  Normal speech and language. No gross focal neurologic deficits are appreciated. No gait instability. Skin:  Skin is warm, dry and intact. No rash  noted. Psychiatric: Mood and affect are normal. Speech and behavior are normal.  ____________________________________________   LABS (all labs ordered are listed, but only abnormal results are displayed)  Labs Reviewed  COMPREHENSIVE METABOLIC PANEL - Abnormal; Notable for the following:    Potassium 2.9 (*)    Chloride 100 (*)    Glucose, Bld 204 (*)    GFR calc non Af Amer 58 (*)    All other components within normal limits  CBC WITH DIFFERENTIAL/PLATELET - Abnormal; Notable for the following:    RBC 4.20 (*)    Hemoglobin 12.6 (*)    HCT 36.9 (*)    RDW 14.7 (*)    All other components within normal limits  URINALYSIS COMPLETEWITH MICROSCOPIC ([facility] ONLY) - Abnormal; Notable for the following:    Color, Urine YELLOW (*)    APPearance CLEAR (*)    Squamous Epithelial / LPF 0-5 (*)    All other components within normal limits   ____________________________________________  EKG   Date: 03/17/2016  Rate: 68  Rhythm: normal sinus rhythm  QRS Axis: normal  Intervals: rbbb& lafb  ST/T Wave abnormalities: normal  Conduction Disutrbances: none  Narrative Interpretation: unremarkable     ____________________________________________  RADIOLOGY CT a/p: IMPRESSION: No acute findings in the abdomen/pelvis.  Several lower pole bilateral parapelvic renal cysts.  Minimal diverticulosis of the colon.  Calcified plaque over the right coronary artery.   Electronically Signed By: Marin Olp M.D. On: 03/17/2016 09:17 ____________________________________________ ____________________________________________   INITIAL IMPRESSION / ASSESSMENT AND PLAN / ED COURSE  Pertinent labs & imaging results that were available during my care of the patient were reviewed by me and considered in my medical decision making (see chart for details).  ----------------------------------------- 11:16 AM on  03/17/2016 -----------------------------------------  Patient reports  feeling better after Toradol. I suspect a pulled muscle. Advised patient to do only light work until symptoms have resolved. Strict return precautions given. ____________________________________________   FINAL CLINICAL IMPRESSION(S) / ED DIAGNOSES  Final diagnoses:  Right-sided low back pain without sciatica  Hypokalemia   Hyperglycemia   New prescriptions started this visit New Prescriptions   No medications on file      Ponciano Ort, MD 03/17/16 1117

## 2016-03-19 ENCOUNTER — Other Ambulatory Visit: Payer: Self-pay | Admitting: *Deleted

## 2016-03-19 MED ORDER — MELOXICAM 7.5 MG PO TABS: ORAL_TABLET | ORAL | Status: DC

## 2016-05-05 ENCOUNTER — Ambulatory Visit (INDEPENDENT_AMBULATORY_CARE_PROVIDER_SITE_OTHER): Payer: Commercial Managed Care - HMO | Admitting: Internal Medicine

## 2016-05-05 VITALS — BP 124/68 | HR 82 | Temp 97.6°F | Resp 14 | Wt 189.0 lb

## 2016-05-05 DIAGNOSIS — R42 Dizziness and giddiness: Secondary | ICD-10-CM

## 2016-05-05 DIAGNOSIS — J309 Allergic rhinitis, unspecified: Secondary | ICD-10-CM

## 2016-05-05 DIAGNOSIS — H6982 Other specified disorders of Eustachian tube, left ear: Secondary | ICD-10-CM | POA: Diagnosis not present

## 2016-05-05 DIAGNOSIS — J438 Other emphysema: Secondary | ICD-10-CM

## 2016-05-05 MED ORDER — MECLIZINE HCL 12.5 MG PO TABS: 12.5000 mg | ORAL_TABLET | Freq: Three times a day (TID) | ORAL | 1 refills | Status: AC | PRN

## 2016-05-05 MED ORDER — METHYLPREDNISOLONE ACETATE 80 MG/ML IJ SUSP
80.0000 mg | Freq: Once | INTRAMUSCULAR | Status: AC
  Administered 2016-05-05: 80 mg via INTRAMUSCULAR

## 2016-05-05 MED ORDER — PREDNISONE 10 MG PO TABS: ORAL_TABLET | ORAL | 0 refills | Status: DC

## 2016-05-05 NOTE — Progress Notes (Signed)
Subjective:    Patient ID: Travis Carey, male    DOB: 07/28/1941, 75 y.o.   MRN: [id number]  HPI  Here with c/o ? Ear infection bilat x 3 wk?  C/o 3 wks onset bilat ear pain/discomfort mild to mod, gradually worsening, constant, assoc with much head congestion, dizziness with turning the head, not sure about fever, nothing else seems to make better or worse.  All despite good compliance with antihist and flonase, has been spending more time outdoors recently. Has some popping and crackling to ear, some intermitent hearing decrease Past Medical History:  Diagnosis Date  . Allergic rhinitis, cause unspecified 01/31/2013  . ANEMIA-NOS 01/28/2010  . BPH (benign prostatic hypertrophy) 04/25/2013  . CAD 02/17/2010  . CAROTID BRUIT 11/18/2009  . Carpal tunnel syndrome 05/20/2008  . Cervical radiculitis 04/17/2012  . CHEST PAIN 09/26/2009  . DEPRESSION 06/12/2007  . DYSPNEA 07/28/2010  . ERECTILE DYSFUNCTION 02/24/2009  . GERD 06/09/2007  . GERD (gastroesophageal reflux disease)   . GLUCOSE INTOLERANCE 01/28/2010  . HEMORRHOIDS 06/09/2007  . History of colonic polyps 08/07/2015  . HYPERLIPIDEMIA 02/07/2008  . HYPERTENSION 06/09/2007  . HYPOKALEMIA 01/28/2010  . Impaired glucose tolerance 01/23/2011  . LOW BACK PAIN 06/12/2007  . MYOCARDIAL PERFUSION SCAN, WITH STRESS TEST, ABNORMAL 10/08/2009  . PARESTHESIA 04/25/2008  . SHOULDER PAIN, LEFT 04/25/2008  . URI 08/19/2008  . VITAMIN B12 DEFICIENCY 02/09/2010   Past Surgical History:  Procedure Laterality Date  . COLONOSCOPY    . CORONARY STENT PLACEMENT     from right radial    reports that he has never smoked. He has never used smokeless tobacco. He reports that he does not drink alcohol or use drugs. family history includes Alcohol abuse in his brother and sister; Coronary artery disease in his other; Heart attack in his other; Heart disease in his father and mother; Hyperlipidemia in his father and mother; Hypertension in his father, mother, and other;  Stroke in his other. Allergies  Allergen Reactions  . Diclofenac Sodium     REACTION: maks pt drowsey   Current Outpatient Prescriptions on File Prior to Visit  Medication Sig Dispense Refill  . amLODipine (NORVASC) 10 MG tablet Take 1 tablet (10 mg total) by mouth daily. 90 tablet 3  . aspirin EC 81 MG tablet Take 81 mg by mouth daily.    . carvedilol (COREG) 12.5 MG tablet Take 1 tablet (12.5 mg total) by mouth 2 (two) times daily. 180 tablet 3  . clopidogrel (PLAVIX) 75 MG tablet Take 1 tablet (75 mg total) by mouth daily. 90 tablet 3  . colchicine 0.6 MG tablet Take 1 tablet (0.6 mg total) by mouth daily. (Patient taking differently: Take 0.6 mg by mouth as needed. ) 90 tablet 3  . dexlansoprazole (DEXILANT) 60 MG capsule Take 1 capsule (60 mg total) by mouth daily. 90 capsule 3  . fexofenadine (ALLEGRA) 180 MG tablet Take 1 tablet (180 mg total) by mouth daily. As needed (Patient taking differently: Take 180 mg by mouth daily as needed for allergies. ) 90 tablet 3  . fluticasone (FLONASE) 50 MCG/ACT nasal spray USE TWO SPRAYS IN EACH NOSTRIL ONCE DAILY 16 g 5  . HYDROcodone-acetaminophen (NORCO/VICODIN) 5-325 MG tablet Take 1 tablet by mouth every 6 (six) hours as needed for moderate pain. 60 tablet 0  . lisinopril-hydrochlorothiazide (PRINZIDE,ZESTORETIC) 10-12.5 MG per tablet Take 1 tablet by mouth 2 (two) times daily.    . meloxicam (MOBIC) 7.5 MG tablet TAKE 1 TO  2 TABLETS ONE TIME DAILY AS NEEDED FOR PAIN 60 tablet 2  . omeprazole (PRILOSEC) 20 MG capsule Take 1 capsule (20 mg total) by mouth daily. 30 capsule 3  . potassium chloride (K-DUR) 10 MEQ tablet Take 2 tablets (20 mEq total) by mouth daily. 180 tablet 1  . rosuvastatin (CRESTOR) 10 MG tablet TAKE 1 TABLET EVERY DAY 90 tablet 1  . tamsulosin (FLOMAX) 0.4 MG CAPS capsule Take 1 capsule (0.4 mg total) by mouth daily. 90 capsule 3   No current facility-administered medications on file prior to visit.       Review of  Systems  Constitutional: Negative for unusual diaphoresis or night sweats HENT: Negative for ear swelling or discharge Eyes: Negative for worsening visual haziness  Respiratory: Negative for choking and stridor.   Gastrointestinal: Negative for distension or worsening eructation Genitourinary: Negative for retention or change in urine volume.  Musculoskeletal: Negative for other MSK pain or swelling Skin: Negative for color change and worsening wound Neurological: Negative for tremors and numbness other than noted  Psychiatric/Behavioral: Negative for decreased concentration or agitation other than above       Objective:   Physical Exam BP 124/68 (BP Location: Left Arm, Patient Position: Sitting, Cuff Size: Large)   Pulse 82   Temp 97.6 F (36.4 C) (Oral)   Resp 14   Wt 189 lb (85.7 kg)   SpO2 96%   BMI 28.74 kg/m  VS noted, not ill appearing Constitutional: Pt appears in no apparent distress HENT: Head: NCAT.  Right Ear: External ear normal.  Left Ear: External ear normal.  Bilat tm's with mild erythema.  Max sinus areas non tender.  Pharynx with mild erythema, no exudate Eyes: . Pupils are equal, round, and reactive to light. Conjunctivae and EOM are normal Neck: Normal range of motion. Neck supple.  Cardiovascular: Normal rate and regular rhythm.   Pulmonary/Chest: Effort normal and breath sounds without rales or wheezing.  Neurological: Pt is alert. Not confused , motor grossly intact Skin: Skin is warm. No rash, no LE edema Psychiatric: Pt behavior is normal. No agitation.     Assessment & Plan:

## 2016-05-05 NOTE — Patient Instructions (Signed)
You had the steroid shot today  Please take all new medication as prescribed - the prednisone, as well as the meclizine as needed for the dizziness  You can also take Mucinex (or it's generic off brand) for congestion, and tylenol as needed for pain.  Please continue all other medications as before, and refills have been done if requested.  Please have the pharmacy call with any other refills you may need.  Please keep your appointments with your specialists as you may have planned

## 2016-05-08 NOTE — Assessment & Plan Note (Signed)
stable overall by history and exam, recent data reviewed with pt, and pt to continue medical treatment as before,  to f/u any worsening symptoms or concerns @LASTSAO2(3)@  

## 2016-05-08 NOTE — Assessment & Plan Note (Signed)
Mild to mod seasonal flare, for depomedrol IM, predpac asd,,  to f/u any worsening symptoms or concerns

## 2016-05-08 NOTE — Assessment & Plan Note (Signed)
Mild, likely peripheral, for meclizine prn,  to f/u any worsening symptoms or concerns

## 2016-05-08 NOTE — Assessment & Plan Note (Signed)
Encouraged to take mucinex otc bid prn,  to f/u any worsening symptoms or concerns

## 2016-06-21 ENCOUNTER — Other Ambulatory Visit: Payer: Self-pay | Admitting: Cardiovascular Disease

## 2016-06-30 ENCOUNTER — Other Ambulatory Visit: Payer: Self-pay | Admitting: Internal Medicine

## 2016-07-28 ENCOUNTER — Other Ambulatory Visit (INDEPENDENT_AMBULATORY_CARE_PROVIDER_SITE_OTHER): Payer: Commercial Managed Care - HMO

## 2016-07-28 DIAGNOSIS — E1159 Type 2 diabetes mellitus with other circulatory complications: Secondary | ICD-10-CM

## 2016-07-28 DIAGNOSIS — E1165 Type 2 diabetes mellitus with hyperglycemia: Secondary | ICD-10-CM | POA: Diagnosis not present

## 2016-07-28 DIAGNOSIS — IMO0002 Reserved for concepts with insufficient information to code with codable children: Secondary | ICD-10-CM

## 2016-07-28 LAB — HEPATIC FUNCTION PANEL
ALK PHOS: 55 U/L (ref 39–117)
ALT: 13 U/L (ref 0–53)
AST: 17 U/L (ref 0–37)
Albumin: 3.8 g/dL (ref 3.5–5.2)
BILIRUBIN DIRECT: 0.1 mg/dL (ref 0.0–0.3)
BILIRUBIN TOTAL: 0.4 mg/dL (ref 0.2–1.2)
Total Protein: 7.1 g/dL (ref 6.0–8.3)

## 2016-07-28 LAB — LDL CHOLESTEROL, DIRECT: Direct LDL: 57 mg/dL

## 2016-07-28 LAB — BASIC METABOLIC PANEL
BUN: 16 mg/dL (ref 6–23)
CALCIUM: 9.8 mg/dL (ref 8.4–10.5)
CO2: 33 mEq/L — ABNORMAL HIGH (ref 19–32)
Chloride: 104 mEq/L (ref 96–112)
Creatinine, Ser: 1.08 mg/dL (ref 0.40–1.50)
GFR: 85.82 mL/min (ref >60.00)
Glucose, Bld: 137 mg/dL — ABNORMAL HIGH (ref 70–99)
POTASSIUM: 3.3 meq/L — AB (ref 3.5–5.1)
SODIUM: 142 meq/L (ref 135–145)

## 2016-07-28 LAB — LIPID PANEL
Cholesterol: 131 mg/dL (ref 0–200)
HDL: 28.2 mg/dL — AB (ref >39.00)
NONHDL: 102.69
Total CHOL/HDL Ratio: 5
Triglycerides: 314 mg/dL — ABNORMAL HIGH (ref 0.0–149.0)
VLDL: 62.8 mg/dL — AB (ref 0.0–40.0)

## 2016-07-28 LAB — HEMOGLOBIN A1C: Hgb A1c MFr Bld: 6.3 % (ref 4.6–6.5)

## 2016-08-04 ENCOUNTER — Other Ambulatory Visit: Payer: Self-pay | Admitting: Internal Medicine

## 2016-08-05 ENCOUNTER — Ambulatory Visit (INDEPENDENT_AMBULATORY_CARE_PROVIDER_SITE_OTHER): Payer: Commercial Managed Care - HMO | Admitting: Internal Medicine

## 2016-08-05 ENCOUNTER — Encounter: Payer: Self-pay | Admitting: Internal Medicine

## 2016-08-05 VITALS — BP 148/72 | HR 69 | Temp 98.4°F | Ht 68.0 in | Wt 206.0 lb

## 2016-08-05 DIAGNOSIS — I10 Essential (primary) hypertension: Secondary | ICD-10-CM

## 2016-08-05 DIAGNOSIS — E876 Hypokalemia: Secondary | ICD-10-CM

## 2016-08-05 DIAGNOSIS — E785 Hyperlipidemia, unspecified: Secondary | ICD-10-CM

## 2016-08-05 DIAGNOSIS — E1165 Type 2 diabetes mellitus with hyperglycemia: Secondary | ICD-10-CM

## 2016-08-05 DIAGNOSIS — E1159 Type 2 diabetes mellitus with other circulatory complications: Secondary | ICD-10-CM | POA: Diagnosis not present

## 2016-08-05 DIAGNOSIS — IMO0002 Reserved for concepts with insufficient information to code with codable children: Secondary | ICD-10-CM

## 2016-08-05 DIAGNOSIS — N453 Epididymo-orchitis: Secondary | ICD-10-CM

## 2016-08-05 DIAGNOSIS — Z0001 Encounter for general adult medical examination with abnormal findings: Secondary | ICD-10-CM

## 2016-08-05 MED ORDER — CIPROFLOXACIN HCL 500 MG PO TABS: 500.0000 mg | ORAL_TABLET | Freq: Two times a day (BID) | ORAL | 0 refills | Status: AC

## 2016-08-05 NOTE — Progress Notes (Signed)
Subjective:    Patient ID: Travis Carey, male    DOB: 02/28/1941, 75 y.o.   MRN: [id number]  HPI Here to f/u; overall doing ok,  Pt denies chest pain, increasing sob or doe, wheezing, orthopnea, PND, increased LE swelling, palpitations, dizziness or syncope.  Pt denies new neurological symptoms such as new headache, or facial or extremity weakness or numbness.  Pt denies polydipsia, polyuria, or low sugar episode.   Pt denies new neurological symptoms such as new headache, or facial or extremity weakness or numbness.   Pt states overall good compliance with meds, mostly trying to follow appropriate diet, with wt overall stable,  but little exercise however. Just not interested now in this.  No new complaints, denies other change to hx except for recurrence of left testical pain, swelling without fever, and ,Denies urinary symptoms such as dysuria, frequency, urgency, flank pain, hematuria or n/v, fever, chills.   Wt Readings from Last 3 Encounters:  08/05/16 206 lb (93.4 kg)  05/05/16 189 lb (85.7 kg)  03/17/16 200 lb (90.7 kg)   BP Readings from Last 3 Encounters:  08/05/16 (!) 148/72  05/05/16 124/68  03/17/16 (!) 160/74  BP at home twice per day < 140/90  Only taking 1 per day K as he thought it might be too [place] Past Medical History:  Diagnosis Date  . Allergic rhinitis, cause unspecified 01/31/2013  . ANEMIA-NOS 01/28/2010  . BPH (benign prostatic hypertrophy) 04/25/2013  . CAD 02/17/2010  . CAROTID BRUIT 11/18/2009  . Carpal tunnel syndrome 05/20/2008  . Cervical radiculitis 04/17/2012  . CHEST PAIN 09/26/2009  . DEPRESSION 06/12/2007  . DYSPNEA 07/28/2010  . ERECTILE DYSFUNCTION 02/24/2009  . GERD 06/09/2007  . GERD (gastroesophageal reflux disease)   . GLUCOSE INTOLERANCE 01/28/2010  . HEMORRHOIDS 06/09/2007  . History of colonic polyps 08/07/2015  . HYPERLIPIDEMIA 02/07/2008  . HYPERTENSION 06/09/2007  . HYPOKALEMIA 01/28/2010  . Impaired glucose tolerance 01/23/2011  . LOW BACK PAIN  06/12/2007  . MYOCARDIAL PERFUSION SCAN, WITH STRESS TEST, ABNORMAL 10/08/2009  . PARESTHESIA 04/25/2008  . SHOULDER PAIN, LEFT 04/25/2008  . URI 08/19/2008  . VITAMIN B12 DEFICIENCY 02/09/2010   Past Surgical History:  Procedure Laterality Date  . COLONOSCOPY    . CORONARY STENT PLACEMENT     from right radial    reports that he has never smoked. He has never used smokeless tobacco. He reports that he does not drink alcohol or use drugs. family history includes Alcohol abuse in his brother and sister; Coronary artery disease in his other; Heart attack in his other; Heart disease in his father and mother; Hyperlipidemia in his father and mother; Hypertension in his father, mother, and other; Stroke in his other. Allergies  Allergen Reactions  . Diclofenac Sodium     REACTION: maks pt drowsey   Current Outpatient Prescriptions on File Prior to Visit  Medication Sig Dispense Refill  . amLODipine (NORVASC) 10 MG tablet TAKE 1 TABLET (10 MG TOTAL) BY MOUTH DAILY. 90 tablet 3  . aspirin EC 81 MG tablet Take 81 mg by mouth daily.    . carvedilol (COREG) 12.5 MG tablet Take 1 tablet (12.5 mg total) by mouth 2 (two) times daily. 180 tablet 3  . clopidogrel (PLAVIX) 75 MG tablet TAKE 1 TABLET (75 MG TOTAL) BY MOUTH DAILY. 90 tablet 3  . colchicine 0.6 MG tablet Take 1 tablet (0.6 mg total) by mouth daily. (Patient taking differently: Take 0.6 mg by mouth as needed. )  90 tablet 3  . dexlansoprazole (DEXILANT) 60 MG capsule Take 1 capsule (60 mg total) by mouth daily. 90 capsule 3  . fexofenadine (ALLEGRA) 180 MG tablet Take 1 tablet (180 mg total) by mouth daily. As needed (Patient taking differently: Take 180 mg by mouth daily as needed for allergies. ) 90 tablet 3  . HYDROcodone-acetaminophen (NORCO/VICODIN) 5-325 MG tablet Take 1 tablet by mouth every 6 (six) hours as needed for moderate pain. 60 tablet 0  . lisinopril-hydrochlorothiazide (PRINZIDE,ZESTORETIC) 10-12.5 MG per tablet Take 1 tablet by  mouth 2 (two) times daily.    Marland Kitchen lisinopril-hydrochlorothiazide (PRINZIDE,ZESTORETIC) 10-12.5 MG tablet TAKE TWO TABLETS BY MOUTH ONCE DAILY 60 tablet 3  . meclizine (ANTIVERT) 12.5 MG tablet Take 1 tablet (12.5 mg total) by mouth 3 (three) times daily as needed for dizziness. 30 tablet 1  . meloxicam (MOBIC) 7.5 MG tablet TAKE 1 TO 2 TABLETS ONE TIME DAILY AS NEEDED FOR PAIN 180 tablet 1  . omeprazole (PRILOSEC) 20 MG capsule Take 1 capsule (20 mg total) by mouth daily. 30 capsule 3  . potassium chloride (K-DUR) 10 MEQ tablet Take 2 tablets (20 mEq total) by mouth daily. 180 tablet 1  . potassium chloride (K-DUR) 10 MEQ tablet TAKE TWO TABLETS BY MOUTH ONCE DAILY 180 tablet 3  . predniSONE (DELTASONE) 10 MG tablet 3 tabs by mouth per day for 3 days,2tabs per day for 3 days,1tab per day for 3 days 18 tablet 0  . rosuvastatin (CRESTOR) 10 MG tablet TAKE 1 TABLET EVERY DAY 90 tablet 1  . tamsulosin (FLOMAX) 0.4 MG CAPS capsule Take 1 capsule (0.4 mg total) by mouth daily. 90 capsule 3   No current facility-administered medications on file prior to visit.    Review of Systems  Constitutional: Negative for unusual diaphoresis or night sweats HENT: Negative for ear swelling or discharge Eyes: Negative for worsening visual haziness  Respiratory: Negative for choking and stridor.   Gastrointestinal: Negative for distension or worsening eructation Genitourinary: Negative for retention or change in urine volume.  Musculoskeletal: Negative for other MSK pain or swelling Skin: Negative for color change and worsening wound Neurological: Negative for tremors and numbness other than noted  Psychiatric/Behavioral: Negative for decreased concentration or agitation other than above   All other system noncontributory    Objective:   Physical Exam BP (!) 148/72   Pulse 69   Temp 98.4 F (36.9 C) (Oral)   Ht 5\' 8"  (1.727 m)   Wt 206 lb (93.4 kg)   SpO2 95%   BMI 31.32 kg/m  VS noted,    Constitutional: Pt appears in no apparent distress HENT: Head: NCAT.  Right Ear: External ear normal.  Left Ear: External ear normal.  Eyes: . Pupils are equal, round, and reactive to light. Conjunctivae and EOM are normal Neck: Normal range of motion. Neck supple.  Cardiovascular: Normal rate and regular rhythm.   Pulmonary/Chest: Effort normal and breath sounds without rales or wheezing.  Neurological: Pt is alert. Not confused , motor grossly intact Skin: Skin is warm. No rash, no LE edema Psychiatric: Pt behavior is normal. No agitation.  No other significant exam abnormal GU: left testical and epididymal pain/tender/swelling 2+    Assessment & Plan:

## 2016-08-05 NOTE — Patient Instructions (Signed)
Please take all new medication as prescribed - the cipro (sent to [facility])  Please continue all other medications as before, and refills have been done if requested.  Please have the pharmacy call with any other refills you may need.  Please continue your efforts at being more active, low cholesterol diet, and weight control.  Please keep your appointments with your specialists as you may have planned  Please return in 6 months, or sooner if needed, with Lab testing done 3-5 days before

## 2016-08-05 NOTE — Progress Notes (Signed)
Pre visit review using our clinic review tool, if applicable. No additional management support is needed unless otherwise documented below in the visit note. 

## 2016-08-06 ENCOUNTER — Other Ambulatory Visit: Payer: Self-pay | Admitting: Internal Medicine

## 2016-08-06 ENCOUNTER — Ambulatory Visit: Payer: Commercial Managed Care - HMO | Admitting: Internal Medicine

## 2016-08-07 NOTE — Assessment & Plan Note (Signed)
stable overall by history and exam, recent data reviewed with pt, and pt to continue medical treatment as before,  to f/u any worsening symptoms or concerns Lab Results  Component Value Date   LDLCALC 64 04/08/2015   

## 2016-08-07 NOTE — Assessment & Plan Note (Signed)
stable overall by history and exam, recent data reviewed with pt, and pt to continue medical treatment as before,  to f/u any worsening symptoms or concerns BP Readings from Last 3 Encounters:  08/05/16 (!) 148/72  05/05/16 124/68  03/17/16 (!) 160/74

## 2016-08-07 NOTE — Assessment & Plan Note (Signed)
Mild to mod, for antibx course,  to f/u any worsening symptoms or concerns 

## 2016-08-07 NOTE — Assessment & Plan Note (Signed)
stable overall by history and exam, recent data reviewed with pt, and pt to continue medical treatment as before,  to f/u any worsening symptoms or concerns Lab Results  Component Value Date   HGBA1C 6.3 07/28/2016

## 2016-08-07 NOTE — Assessment & Plan Note (Signed)
Mild, encouraged compliance with med as prescribed

## 2016-09-16 ENCOUNTER — Encounter: Payer: Self-pay | Admitting: Internal Medicine

## 2016-09-16 ENCOUNTER — Ambulatory Visit (INDEPENDENT_AMBULATORY_CARE_PROVIDER_SITE_OTHER): Payer: Commercial Managed Care - HMO | Admitting: Internal Medicine

## 2016-09-16 VITALS — BP 140/80 | HR 72 | Temp 98.8°F | Resp 20 | Wt 208.0 lb

## 2016-09-16 DIAGNOSIS — E1165 Type 2 diabetes mellitus with hyperglycemia: Secondary | ICD-10-CM

## 2016-09-16 DIAGNOSIS — I10 Essential (primary) hypertension: Secondary | ICD-10-CM

## 2016-09-16 DIAGNOSIS — M109 Gout, unspecified: Secondary | ICD-10-CM | POA: Diagnosis not present

## 2016-09-16 DIAGNOSIS — IMO0002 Reserved for concepts with insufficient information to code with codable children: Secondary | ICD-10-CM

## 2016-09-16 DIAGNOSIS — E1159 Type 2 diabetes mellitus with other circulatory complications: Secondary | ICD-10-CM

## 2016-09-16 MED ORDER — PREDNISONE 10 MG PO TABS: ORAL_TABLET | ORAL | 0 refills | Status: DC

## 2016-09-16 MED ORDER — TRAMADOL HCL 50 MG PO TABS: 50.0000 mg | ORAL_TABLET | Freq: Four times a day (QID) | ORAL | 0 refills | Status: AC | PRN

## 2016-09-16 NOTE — Patient Instructions (Addendum)
You had the steroid shot today  Please take all new medication as prescribed - the prednisone, and pain medication if needed  Please continue all other medications as before, and refills have been done if requested.  Please have the pharmacy call with any other refills you may need.  Please keep your appointments with your specialists as you may have planned   

## 2016-09-16 NOTE — Progress Notes (Signed)
Subjective:    Patient ID: Travis Carey, male    DOB: September 04, 1941, 75 y.o.   MRN: [id number]  HPI  Here with acute onset 6 days bilat hand pain, swelling, stiffness and reduced function, c/w prior episodes acute gout, just not improving as it usually does. No trauma, fever.  Last episode > 1 yr, only seems to get this about once per year, has not wanted preventive med in past.  Pt denies chest pain, increased sob or doe, wheezing, orthopnea, PND, increased LE swelling, palpitations, dizziness or syncope. Past Medical History:  Diagnosis Date  . Allergic rhinitis, cause unspecified 01/31/2013  . ANEMIA-NOS 01/28/2010  . BPH (benign prostatic hypertrophy) 04/25/2013  . CAD 02/17/2010  . CAROTID BRUIT 11/18/2009  . Carpal tunnel syndrome 05/20/2008  . Cervical radiculitis 04/17/2012  . CHEST PAIN 09/26/2009  . DEPRESSION 06/12/2007  . DYSPNEA 07/28/2010  . ERECTILE DYSFUNCTION 02/24/2009  . GERD 06/09/2007  . GERD (gastroesophageal reflux disease)   . GLUCOSE INTOLERANCE 01/28/2010  . HEMORRHOIDS 06/09/2007  . History of colonic polyps 08/07/2015  . HYPERLIPIDEMIA 02/07/2008  . HYPERTENSION 06/09/2007  . HYPOKALEMIA 01/28/2010  . Impaired glucose tolerance 01/23/2011  . LOW BACK PAIN 06/12/2007  . MYOCARDIAL PERFUSION SCAN, WITH STRESS TEST, ABNORMAL 10/08/2009  . PARESTHESIA 04/25/2008  . SHOULDER PAIN, LEFT 04/25/2008  . URI 08/19/2008  . VITAMIN B12 DEFICIENCY 02/09/2010   Past Surgical History:  Procedure Laterality Date  . COLONOSCOPY    . CORONARY STENT PLACEMENT     from right radial    reports that he has never smoked. He has never used smokeless tobacco. He reports that he does not drink alcohol or use drugs. family history includes Alcohol abuse in his brother and sister; Coronary artery disease in his other; Heart attack in his other; Heart disease in his father and mother; Hyperlipidemia in his father and mother; Hypertension in his father, mother, and other; Stroke in his other. Allergies    Allergen Reactions  . Diclofenac Sodium     REACTION: maks pt drowsey   Current Outpatient Prescriptions on File Prior to Visit  Medication Sig Dispense Refill  . amLODipine (NORVASC) 10 MG tablet TAKE 1 TABLET (10 MG TOTAL) BY MOUTH DAILY. 90 tablet 3  . aspirin EC 81 MG tablet Take 81 mg by mouth daily.    . carvedilol (COREG) 12.5 MG tablet Take 1 tablet (12.5 mg total) by mouth 2 (two) times daily. 180 tablet 3  . clopidogrel (PLAVIX) 75 MG tablet TAKE 1 TABLET (75 MG TOTAL) BY MOUTH DAILY. 90 tablet 3  . colchicine 0.6 MG tablet Take 1 tablet (0.6 mg total) by mouth daily. (Patient taking differently: Take 0.6 mg by mouth as needed. ) 90 tablet 3  . dexlansoprazole (DEXILANT) 60 MG capsule Take 1 capsule (60 mg total) by mouth daily. 90 capsule 3  . fexofenadine (ALLEGRA) 180 MG tablet Take 1 tablet (180 mg total) by mouth daily. As needed (Patient taking differently: Take 180 mg by mouth daily as needed for allergies. ) 90 tablet 3  . fluticasone (FLONASE) 50 MCG/ACT nasal spray USE TWO SPRAYS IN EACH NOSTRIL ONCE DAILY 48 g 5  . HYDROcodone-acetaminophen (NORCO/VICODIN) 5-325 MG tablet Take 1 tablet by mouth every 6 (six) hours as needed for moderate pain. 60 tablet 0  . lisinopril-hydrochlorothiazide (PRINZIDE,ZESTORETIC) 10-12.5 MG per tablet Take 1 tablet by mouth 2 (two) times daily.    Marland Kitchen lisinopril-hydrochlorothiazide (PRINZIDE,ZESTORETIC) 10-12.5 MG tablet TAKE TWO TABLETS BY MOUTH  ONCE DAILY 60 tablet 3  . meclizine (ANTIVERT) 12.5 MG tablet Take 1 tablet (12.5 mg total) by mouth 3 (three) times daily as needed for dizziness. 30 tablet 1  . meloxicam (MOBIC) 7.5 MG tablet TAKE 1 TO 2 TABLETS ONE TIME DAILY AS NEEDED FOR PAIN 180 tablet 1  . omeprazole (PRILOSEC) 20 MG capsule Take 1 capsule (20 mg total) by mouth daily. 30 capsule 3  . potassium chloride (K-DUR) 10 MEQ tablet Take 2 tablets (20 mEq total) by mouth daily. 180 tablet 1  . potassium chloride (K-DUR) 10 MEQ tablet  TAKE TWO TABLETS BY MOUTH ONCE DAILY 180 tablet 3  . rosuvastatin (CRESTOR) 10 MG tablet TAKE 1 TABLET EVERY DAY 90 tablet 1  . tamsulosin (FLOMAX) 0.4 MG CAPS capsule Take 1 capsule (0.4 mg total) by mouth daily. 90 capsule 3   No current facility-administered medications on file prior to visit.    Review of Systems  Constitutional: Negative for unusual diaphoresis or night sweats HENT: Negative for ear swelling or discharge Eyes: Negative for worsening visual haziness  Respiratory: Negative for choking and stridor.   Gastrointestinal: Negative for distension or worsening eructation Genitourinary: Negative for retention or change in urine volume.  Musculoskeletal: Negative for other MSK pain or swelling Skin: Negative for color change and worsening wound Neurological: Negative for tremors and numbness other than noted  Psychiatric/Behavioral: Negative for decreased concentration or agitation other than above   All other system neg per pt    Objective:   Physical Exam BP 140/80   Pulse 72   Temp 98.8 F (37.1 C) (Oral)   Resp 20   Wt 208 lb (94.3 kg)   SpO2 98%   BMI 31.63 kg/m  VS noted,  Constitutional: Pt appears in no apparent distress HENT: Head: NCAT.  Right Ear: External ear normal.  Left Ear: External ear normal.  Eyes: . Pupils are equal, round, and reactive to light. Conjunctivae and EOM are normal Neck: Normal range of motion. Neck supple.  Cardiovascular: Normal rate and regular rhythm.   Pulmonary/Chest: Effort normal and breath sounds without rales or wheezing.  Neurological: Pt is alert. Not confused , motor grossly intact bilat hands with first mcps bilat tender with large swelling right > left hands Skin: Skin is warm. No rash, no LE edema Psychiatric: Pt behavior is normal. No agitation.  No other new exam findings    Assessment & Plan:

## 2016-09-16 NOTE — Progress Notes (Signed)
Pre visit review using our clinic review tool, if applicable. No additional management support is needed unless otherwise documented below in the visit note. 

## 2016-09-18 NOTE — Assessment & Plan Note (Signed)
stable overall by history and exam, recent data reviewed with pt, and pt to continue medical treatment as before,  to f/u any worsening symptoms or concerns BP Readings from Last 3 Encounters:  09/16/16 140/80  08/05/16 (!) 148/72  05/05/16 124/68

## 2016-09-18 NOTE — Assessment & Plan Note (Signed)
stable overall by history and exam, recent data reviewed with pt, and pt to continue medical treatment as before,  to f/u any worsening symptoms or concerns Lab Results  Component Value Date   HGBA1C 6.3 07/28/2016   Pt to call for onset polys or cbg > 200 with steroid tx

## 2016-09-18 NOTE — Assessment & Plan Note (Addendum)
Mild to mod, for depomedrol 80 IM, predpac asd,  Tramadol prn pain, to f/u any worsening symptoms or concerns

## 2016-10-05 ENCOUNTER — Other Ambulatory Visit: Payer: Self-pay | Admitting: Cardiovascular Disease

## 2016-10-12 ENCOUNTER — Emergency Department
Admission: EM | Admit: 2016-10-12 | Discharge: 2016-10-12 | Disposition: A | Discharge status: Home / Self Care | Payer: Commercial Managed Care - HMO | Attending: Emergency Medicine | Admitting: Emergency Medicine

## 2016-10-12 ENCOUNTER — Emergency Department: Payer: Commercial Managed Care - HMO

## 2016-10-12 ENCOUNTER — Encounter: Payer: Self-pay | Admitting: Emergency Medicine

## 2016-10-12 DIAGNOSIS — I2510 Atherosclerotic heart disease of native coronary artery without angina pectoris: Secondary | ICD-10-CM | POA: Diagnosis not present

## 2016-10-12 DIAGNOSIS — Z7982 Long term (current) use of aspirin: Secondary | ICD-10-CM | POA: Insufficient documentation

## 2016-10-12 DIAGNOSIS — R05 Cough: Secondary | ICD-10-CM | POA: Diagnosis not present

## 2016-10-12 DIAGNOSIS — J209 Acute bronchitis, unspecified: Secondary | ICD-10-CM | POA: Diagnosis not present

## 2016-10-12 DIAGNOSIS — E119 Type 2 diabetes mellitus without complications: Secondary | ICD-10-CM | POA: Insufficient documentation

## 2016-10-12 DIAGNOSIS — I10 Essential (primary) hypertension: Secondary | ICD-10-CM | POA: Diagnosis not present

## 2016-10-12 DIAGNOSIS — J111 Influenza due to unidentified influenza virus with other respiratory manifestations: Secondary | ICD-10-CM

## 2016-10-12 DIAGNOSIS — J449 Chronic obstructive pulmonary disease, unspecified: Secondary | ICD-10-CM | POA: Diagnosis not present

## 2016-10-12 MED ORDER — PROMETHAZINE-DM 6.25-15 MG/5ML PO SYRP: 5.0000 mL | ORAL_SOLUTION | Freq: Four times a day (QID) | ORAL | 0 refills | Status: DC | PRN

## 2016-10-12 MED ORDER — AZITHROMYCIN 250 MG PO TABS: ORAL_TABLET | ORAL | 0 refills | Status: DC

## 2016-10-12 MED ORDER — OSELTAMIVIR PHOSPHATE 75 MG PO CAPS: 75.0000 mg | ORAL_CAPSULE | Freq: Two times a day (BID) | ORAL | 0 refills | Status: DC

## 2016-10-12 NOTE — ED Provider Notes (Signed)
[facility] Emergency Department Provider Note  ____________________________________________  Time seen: Approximately 1:49 PM  I have reviewed the triage vital signs and the nursing notes.   HISTORY  Chief Complaint Cough    HPI Travis Carey is a 76 y.o. male , NAD, presents to the emergency department with2 day history of cough, chest congestion, shortness of breath. She states she was exposed to his son a couple of days ago who was diagnosed with flu last week. Patient states that he felt like he had a fever last night but did not check his temperature. Has had increased productive cough with chest congestion through the morning today. Denies chest pain, headaches, changes in speech or gait. No abdominal pain, nausea, vomiting, diarrhea. Has had nasal congestion with thick drainage and sneezing. No sinus pressure or ear pain.    Past Medical History:  Diagnosis Date  . Allergic rhinitis, cause unspecified 01/31/2013  . ANEMIA-NOS 01/28/2010  . BPH (benign prostatic hypertrophy) 04/25/2013  . CAD 02/17/2010  . CAROTID BRUIT 11/18/2009  . Carpal tunnel syndrome 05/20/2008  . Cervical radiculitis 04/17/2012  . CHEST PAIN 09/26/2009  . DEPRESSION 06/12/2007  . DYSPNEA 07/28/2010  . ERECTILE DYSFUNCTION 02/24/2009  . GERD 06/09/2007  . GERD (gastroesophageal reflux disease)   . GLUCOSE INTOLERANCE 01/28/2010  . HEMORRHOIDS 06/09/2007  . History of colonic polyps 08/07/2015  . HYPERLIPIDEMIA 02/07/2008  . HYPERTENSION 06/09/2007  . HYPOKALEMIA 01/28/2010  . Impaired glucose tolerance 01/23/2011  . LOW BACK PAIN 06/12/2007  . MYOCARDIAL PERFUSION SCAN, WITH STRESS TEST, ABNORMAL 10/08/2009  . PARESTHESIA 04/25/2008  . SHOULDER PAIN, LEFT 04/25/2008  . URI 08/19/2008  . VITAMIN B12 DEFICIENCY 02/09/2010    Patient Active Problem List   Diagnosis Date Noted  . Acute gouty arthritis 09/16/2016  . Epididymo-orchitis 08/05/2016  . Vertigo 05/05/2016  . History of colonic  polyps 08/07/2015  . CVA (cerebral infarction) 04/07/2015  . Rhinitis, allergic 03/29/2015  . Diabetes type 2, uncontrolled ([place]) 03/29/2015  . Right arm pain 11/27/2014  . Epididymal cyst 06/12/2014  . Left wrist pain 11/01/2013  . Right wrist pain 11/01/2013  . Nocturia 11/01/2013  . Encounter for preventative adult health care exam with abnormal findings 11/01/2013  . Obese 09/12/2013  . BPH (benign prostatic hypertrophy) 04/25/2013  . Knee pain, bilateral 04/25/2013  . Chest pain 01/31/2013  . Eustachian tube dysfunction 01/31/2013  . Cough 01/31/2013  . Allergic rhinitis 01/31/2013  . Cervical radiculitis 04/17/2012  . Cervical disc disease 04/17/2012  . Paresthesia of arm 12/08/2011  . Hypertension 12/08/2011  . Myalgia 11/05/2011  . B12 deficiency 11/05/2011  . Fatigue 07/27/2011  . COPD (chronic obstructive pulmonary disease) ([place]) 01/26/2011  . Left knee pain 01/26/2011  . Coronary atherosclerosis 02/17/2010  . VITAMIN B12 DEFICIENCY 02/09/2010  . Hypokalemia 01/28/2010  . ANEMIA-NOS 01/28/2010  . CAROTID BRUIT 11/18/2009  . ERECTILE DYSFUNCTION 02/24/2009  . Carpal tunnel syndrome 05/20/2008  . PARESTHESIA 04/25/2008  . Hyperlipidemia 02/07/2008  . DEPRESSION 06/12/2007  . LOW BACK PAIN 06/12/2007  . GERD 06/09/2007    Past Surgical History:  Procedure Laterality Date  . COLONOSCOPY    . CORONARY STENT PLACEMENT     from right radial    Prior to Admission medications   Medication Sig Start Date End Date Taking? Authorizing Provider  amLODipine (NORVASC) 10 MG tablet TAKE 1 TABLET (10 MG TOTAL) BY MOUTH DAILY. 08/05/16   Biagio Borg, MD  aspirin EC 81 MG tablet Take  81 mg by mouth daily.    Historical Provider, MD  azithromycin (ZITHROMAX Z-PAK) 250 MG tablet Take 2 tablets (500 mg) on  Day 1,  followed by 1 tablet (250 mg) once daily on Days 2 through 5. 10/12/16   Sanjana Folz L Trooper Olander, PA-C  carvedilol (COREG) 12.5 MG tablet Take 1 tablet (12.5 mg total) by  mouth 2 (two) times daily. 08/13/15   Biagio Borg, MD  clopidogrel (PLAVIX) 75 MG tablet TAKE 1 TABLET (75 MG TOTAL) BY MOUTH DAILY. 08/05/16   Biagio Borg, MD  colchicine 0.6 MG tablet Take 1 tablet (0.6 mg total) by mouth daily. Patient taking differently: Take 0.6 mg by mouth as needed.  11/01/13   Biagio Borg, MD  dexlansoprazole (DEXILANT) 60 MG capsule Take 1 capsule (60 mg total) by mouth daily. 01/31/13   Biagio Borg, MD  fexofenadine (ALLEGRA) 180 MG tablet Take 1 tablet (180 mg total) by mouth daily. As needed Patient taking differently: Take 180 mg by mouth daily as needed for allergies.  06/12/14   Biagio Borg, MD  fluticasone [facility]) 50 MCG/ACT nasal spray USE TWO SPRAYS IN [facility] NOSTRIL ONCE DAILY 08/06/16   Biagio Borg, MD  HYDROcodone-acetaminophen (NORCO/VICODIN) 5-325 MG tablet Take 1 tablet by mouth every 6 (six) hours as needed for moderate pain. 08/07/15   Biagio Borg, MD  lisinopril-hydrochlorothiazide (PRINZIDE,ZESTORETIC) 10-12.5 MG per tablet Take 1 tablet by mouth 2 (two) times daily.    Historical Provider, MD  lisinopril-hydrochlorothiazide (PRINZIDE,ZESTORETIC) 10-12.5 MG tablet TAKE TWO TABLETS BY MOUTH ONCE DAILY 10/05/16   Minna Merritts, MD  meclizine (ANTIVERT) 12.5 MG tablet Take 1 tablet (12.5 mg total) by mouth 3 (three) times daily as needed for dizziness. 05/05/16 05/05/17  Biagio Borg, MD  meloxicam (MOBIC) 7.5 MG tablet TAKE 1 TO 2 TABLETS ONE TIME DAILY AS NEEDED FOR PAIN 08/05/16   Biagio Borg, MD  omeprazole (PRILOSEC) 20 MG capsule Take 1 capsule (20 mg total) by mouth daily. 01/15/16   Minna Merritts, MD  oseltamivir (TAMIFLU) 75 MG capsule Take 1 capsule (75 mg total) by mouth 2 (two) times daily. 10/12/16   Natividad Schlosser L Vidhi Delellis, PA-C  potassium chloride (K-DUR) 10 MEQ tablet Take 2 tablets (20 mEq total) by mouth daily. 07/29/15   Biagio Borg, MD  potassium chloride (K-DUR) 10 MEQ tablet TAKE TWO TABLETS BY MOUTH ONCE DAILY 07/01/16   Biagio Borg, MD   predniSONE (DELTASONE) 10 MG tablet 3 tabs by mouth per day for 3 days,2tabs per day for 3 days,1tab per day for 3 days 09/16/16   Biagio Borg, MD  promethazine-dextromethorphan (PROMETHAZINE-DM) 6.25-15 MG/5ML syrup Take 5 mLs by mouth 4 (four) times daily as needed for cough. 10/12/16   Caz Weaver L Ladena Jacquez, PA-C  rosuvastatin (CRESTOR) 10 MG tablet TAKE 1 TABLET EVERY DAY 08/05/16   Biagio Borg, MD  tamsulosin (FLOMAX) 0.4 MG CAPS capsule Take 1 capsule (0.4 mg total) by mouth daily. 02/05/16   Biagio Borg, MD  traMADol (ULTRAM) 50 MG tablet Take 1 tablet (50 mg total) by mouth every 6 (six) hours as needed. 09/16/16 09/16/17  Biagio Borg, MD    Allergies Diclofenac sodium  Family History  Problem Relation Age of Onset  . Alcohol abuse Sister     ETOH dependence  . Alcohol abuse Brother     ETOH  dependence  . Heart attack Other   . Stroke Other   .  Hypertension Other   . Coronary artery disease Other   . Hyperlipidemia Mother   . Heart disease Mother   . Hypertension Mother   . Hyperlipidemia Father   . Heart disease Father   . Hypertension Father     Social History Social History  Substance Use Topics  . Smoking status: Never Smoker  . Smokeless tobacco: Never Used  . Alcohol use No     Review of Systems  Constitutional: Positive subjective fevers with chills. No fatigue. Eyes: No visual changes. No discharge ENT: Positive nasal congestion, runny nose, sneezing. No sore throat, sinus pressure, ear pain. Cardiovascular: No chest pain. Respiratory: Positive cough, chest congestion, shortness of breath. No wheezing.  Gastrointestinal: No abdominal pain.  No nausea, vomiting.  No diarrhea.   Musculoskeletal: Positive for general myalgias.  Skin: Negative for rash. Neurological: Negative for headaches, focal weakness or numbness. 10-point ROS otherwise negative.  ____________________________________________   PHYSICAL EXAM:  VITAL SIGNS: ED Triage Vitals  Enc Vitals  Group     BP 10/12/16 1308 (!) 159/73     Pulse Rate 10/12/16 1308 86     Resp 10/12/16 1308 20     Temp 10/12/16 1308 98.6 F (37 C)     Temp Source 10/12/16 1308 Oral     SpO2 10/12/16 1308 94 %     Weight 10/12/16 1307 209 lb (94.8 kg)     Height 10/12/16 1307 5\' 8"  (1.727 m)     Head Circumference --      Peak Flow --      Pain Score 10/12/16 1311 0     Pain Loc --      Pain Edu? --      Excl. in [place]? --      Constitutional: Alert and oriented. Well appearing and in no acute distress. Eyes: Conjunctivae are normal Without icterus, injection or discharge Head: Atraumatic. ENT:      Ears: TMs visualized bilaterally without erythema, effusion, bulging or perforation.      Nose: Mild congestion with profuse clear, thick mucoid rhinorrhea.      Mouth/Throat: Mucous membranes are moist. Pharynx without erythema, swelling, exudate. Clear postnasal drip. Uvula is midline. Airway is patent.  Neck: Supple with full range of motion. Hematological/Lymphatic/Immunilogical: No cervical lymphadenopathy. Cardiovascular: Normal rate, regular rhythm. Normal S1 and S2.  Good peripheral circulation. Respiratory: Normal respiratory effort without tachypnea or retractions. Lungs with coarse breath sounds throughout both lung fields but no wheezes, rhonchi or rales. Neurologic:  Normal speech and language. No gross focal neurologic deficits are appreciated.  Skin:  Skin is warm, dry and intact. No rash noted. Psychiatric: Mood and affect are normal. Speech and behavior are normal. Patient exhibits appropriate insight and judgement.   ____________________________________________   LABS  None ____________________________________________  EKG  None ____________________________________________  RADIOLOGY I, Braxton Feathers, personally viewed and evaluated these images (plain radiographs) as part of my medical decision making, as well as reviewing the written report by the radiologist.  Dg  Chest 2 View  Result Date: 10/12/2016 CLINICAL DATA:  Cough, chest congestion, fever for 2 days EXAM: CHEST  2 VIEW COMPARISON:  01/31/2013 FINDINGS: Heart is normal size. Mediastinal contours are within normal limits. No confluent opacities or effusions. Degenerative spurring in the thoracic spine. IMPRESSION: No active cardiopulmonary disease. Electronically Signed   By: Rolm Baptise M.D.   On: 10/12/2016 15:02    ____________________________________________    PROCEDURES  Procedure(s) performed: None   Procedures  Medications - No data to display   ____________________________________________   INITIAL IMPRESSION / ASSESSMENT AND PLAN / ED COURSE  Pertinent labs & imaging results that were available during my care of the patient were reviewed by me and considered in my medical decision making (see chart for details).  Clinical Course     Patient's diagnosis is consistent with Influenza and acute bronchitis. Patient will be discharged home with prescriptions for azithromycin, Tamiflu and promethazine DM to take as directed. Patient may take over-the-counter Tylenol as needed for fever or aches. Patient is to follow up with his primary care provider in 1-2 days for recheck if symptoms persist past this treatment course. Patient is given strict return precautions to return to the ED for any worsening or new symptoms.    ____________________________________________  FINAL CLINICAL IMPRESSION(S) / ED DIAGNOSES  Final diagnoses:  Influenza  Acute bronchitis, unspecified organism      NEW MEDICATIONS STARTED DURING THIS VISIT:  Discharge Medication List as of 10/12/2016  3:31 PM    START taking these medications   Details  azithromycin (ZITHROMAX Z-PAK) 250 MG tablet Take 2 tablets (500 mg) on  Day 1,  followed by 1 tablet (250 mg) once daily on Days 2 through 5., Print    oseltamivir (TAMIFLU) 75 MG capsule Take 1 capsule (75 mg total) by mouth 2 (two) times daily.,  Starting Tue 10/12/2016, Print    promethazine-dextromethorphan (PROMETHAZINE-DM) 6.25-15 MG/5ML syrup Take 5 mLs by mouth 4 (four) times daily as needed for cough., Starting Tue 10/12/2016, [place], PA-C 10/12/16 1837    Lavonia Drafts, MD 10/14/16 [phone]

## 2016-10-12 NOTE — ED Triage Notes (Signed)
Pt to ED via POV with c/o dry cough, cold like symptoms. Denies fevers or CP. Pt ambulatory, A&Ox4. VS stable

## 2016-10-12 NOTE — ED Notes (Signed)
Pt reports cough for the last two nights - c/o shortness of breath while coughing and chest congestion - cough is productive with yellow phlegm

## 2016-10-21 ENCOUNTER — Other Ambulatory Visit: Payer: Self-pay | Admitting: Internal Medicine

## 2016-11-18 ENCOUNTER — Other Ambulatory Visit: Payer: Self-pay | Admitting: Cardiovascular Disease

## 2016-12-20 ENCOUNTER — Other Ambulatory Visit: Payer: Self-pay | Admitting: Cardiovascular Disease

## 2016-12-23 ENCOUNTER — Other Ambulatory Visit: Payer: Self-pay | Admitting: *Deleted

## 2016-12-23 MED ORDER — LISINOPRIL-HYDROCHLOROTHIAZIDE 10-12.5 MG PO TABS: 2.0000 | ORAL_TABLET | Freq: Every day | ORAL | 2 refills | Status: DC

## 2017-01-11 ENCOUNTER — Ambulatory Visit (INDEPENDENT_AMBULATORY_CARE_PROVIDER_SITE_OTHER): Payer: Medicare HMO | Admitting: Internal Medicine

## 2017-01-11 ENCOUNTER — Other Ambulatory Visit (INDEPENDENT_AMBULATORY_CARE_PROVIDER_SITE_OTHER): Payer: Medicare HMO

## 2017-01-11 ENCOUNTER — Encounter: Payer: Self-pay | Admitting: Internal Medicine

## 2017-01-11 VITALS — BP 140/70 | HR 72 | Temp 98.1°F | Resp 12 | Ht 68.0 in | Wt 208.0 lb

## 2017-01-11 DIAGNOSIS — IMO0002 Reserved for concepts with insufficient information to code with codable children: Secondary | ICD-10-CM

## 2017-01-11 DIAGNOSIS — E1159 Type 2 diabetes mellitus with other circulatory complications: Secondary | ICD-10-CM

## 2017-01-11 DIAGNOSIS — E1165 Type 2 diabetes mellitus with hyperglycemia: Secondary | ICD-10-CM

## 2017-01-11 DIAGNOSIS — Z0000 Encounter for general adult medical examination without abnormal findings: Secondary | ICD-10-CM | POA: Diagnosis not present

## 2017-01-11 DIAGNOSIS — R7989 Other specified abnormal findings of blood chemistry: Secondary | ICD-10-CM

## 2017-01-11 DIAGNOSIS — Z0001 Encounter for general adult medical examination with abnormal findings: Secondary | ICD-10-CM | POA: Diagnosis not present

## 2017-01-11 DIAGNOSIS — E876 Hypokalemia: Secondary | ICD-10-CM | POA: Diagnosis not present

## 2017-01-11 DIAGNOSIS — M109 Gout, unspecified: Secondary | ICD-10-CM | POA: Diagnosis not present

## 2017-01-11 LAB — URINALYSIS, ROUTINE W REFLEX MICROSCOPIC
Bilirubin Urine: NEGATIVE
HGB URINE DIPSTICK: NEGATIVE
KETONES UR: NEGATIVE
NITRITE: NEGATIVE
RBC / HPF: NONE SEEN (ref 0–?)
SPECIFIC GRAVITY, URINE: 1.015 (ref 1.000–1.030)
Total Protein, Urine: NEGATIVE
UROBILINOGEN UA: 0.2 (ref 0.0–1.0)
Urine Glucose: NEGATIVE
pH: 7 (ref 5.0–8.0)

## 2017-01-11 LAB — HEMOGLOBIN A1C: Hgb A1c MFr Bld: 6.4 % (ref 4.6–6.5)

## 2017-01-11 LAB — LIPID PANEL
CHOL/HDL RATIO: 5
Cholesterol: 149 mg/dL (ref 0–200)
HDL: 28.9 mg/dL — ABNORMAL LOW (ref >39.00)
NONHDL: 119.95
Triglycerides: 355 mg/dL — ABNORMAL HIGH (ref 0.0–149.0)
VLDL: 71 mg/dL — ABNORMAL HIGH (ref 0.0–40.0)

## 2017-01-11 LAB — MICROALBUMIN / CREATININE URINE RATIO
CREATININE, U: 165.5 mg/dL
MICROALB/CREAT RATIO: 0.5 mg/g (ref 0.0–30.0)
Microalb, Ur: 0.9 mg/dL (ref 0.0–1.9)

## 2017-01-11 LAB — CBC WITH DIFFERENTIAL/PLATELET
BASOS PCT: 0.8 % (ref 0.0–3.0)
Basophils Absolute: 0 10*3/uL (ref 0.0–0.1)
EOS ABS: 0.1 10*3/uL (ref 0.0–0.7)
Eosinophils Relative: 2.5 % (ref 0.0–5.0)
HCT: 37.3 % — ABNORMAL LOW (ref 39.0–52.0)
Hemoglobin: 13 g/dL (ref 13.0–17.0)
Lymphocytes Relative: 42.5 % (ref 12.0–46.0)
Lymphs Abs: 2.5 10*3/uL (ref 0.7–4.0)
MCHC: 34.7 g/dL (ref 30.0–36.0)
MCV: 87.4 fl (ref 78.0–100.0)
MONO ABS: 0.6 10*3/uL (ref 0.1–1.0)
Monocytes Relative: 10.1 % (ref 3.0–12.0)
NEUTROS ABS: 2.6 10*3/uL (ref 1.4–7.7)
Neutrophils Relative %: 44.1 % (ref 43.0–77.0)
PLATELETS: 217 10*3/uL (ref 150.0–400.0)
RBC: 4.27 Mil/uL (ref 4.22–5.81)
RDW: 14.6 % (ref 11.5–15.5)
WBC: 5.9 10*3/uL (ref 4.0–10.5)

## 2017-01-11 LAB — LDL CHOLESTEROL, DIRECT: LDL DIRECT: 49 mg/dL

## 2017-01-11 LAB — HEPATIC FUNCTION PANEL
ALK PHOS: 53 U/L (ref 39–117)
ALT: 11 U/L (ref 0–53)
AST: 16 U/L (ref 0–37)
Albumin: 4 g/dL (ref 3.5–5.2)
BILIRUBIN DIRECT: 0.1 mg/dL (ref 0.0–0.3)
BILIRUBIN TOTAL: 0.4 mg/dL (ref 0.2–1.2)
TOTAL PROTEIN: 6.8 g/dL (ref 6.0–8.3)

## 2017-01-11 LAB — PSA: PSA: 0.56 ng/mL (ref 0.10–4.00)

## 2017-01-11 LAB — BASIC METABOLIC PANEL
BUN: 15 mg/dL (ref 6–23)
CALCIUM: 9.9 mg/dL (ref 8.4–10.5)
CHLORIDE: 104 meq/L (ref 96–112)
CO2: 34 meq/L — AB (ref 19–32)
Creatinine, Ser: 1.03 mg/dL (ref 0.40–1.50)
GFR: 90.53 mL/min (ref >60.00)
Glucose, Bld: 87 mg/dL (ref 70–99)
Potassium: 3.3 mEq/L — ABNORMAL LOW (ref 3.5–5.1)
SODIUM: 144 meq/L (ref 135–145)

## 2017-01-11 LAB — TSH: TSH: 1.98 u[IU]/mL (ref 0.35–4.50)

## 2017-01-11 MED ORDER — ALLOPURINOL 300 MG PO TABS: 300.0000 mg | ORAL_TABLET | Freq: Every day | ORAL | 3 refills | Status: DC

## 2017-01-11 MED ORDER — PREDNISONE 10 MG PO TABS: ORAL_TABLET | ORAL | 0 refills | Status: DC

## 2017-01-11 MED ORDER — METHYLPREDNISOLONE ACETATE 80 MG/ML IJ SUSP
80.0000 mg | Freq: Once | INTRAMUSCULAR | Status: AC
  Administered 2017-01-11: 80 mg via INTRAMUSCULAR

## 2017-01-11 NOTE — Patient Instructions (Signed)
Please remember to make your yearly eye doctor appt  You had the steroid shot today  Please take all new medication as prescribed - the prednisone now  Please take all new medication as prescribed - the allopurinol, but please 3 days to start this  Please continue all other medications as before, and refills have been done if requested.  Please have the pharmacy call with any other refills you may need.  Please continue your efforts at being more active, low cholesterol diet, and weight control.  You are otherwise up to date with prevention measures today.  Please keep your appointments with your specialists as you may have planned  Please return in 6 months, or sooner if needed, with Lab testing done 3-5 days before

## 2017-01-11 NOTE — Progress Notes (Signed)
Subjective:    Patient ID: Travis Carey, male    DOB: 1941/02/08, 76 y.o.   MRN: [id number]  HPI  Here for wellness and f/u;  Overall doing ok;  Pt denies Chest pain, worsening SOB, DOE, wheezing, orthopnea, PND, worsening LE edema, palpitations, dizziness or syncope.  Pt denies neurological change such as new headache, facial or extremity weakness.  Pt denies polydipsia, polyuria, or low sugar symptoms. Pt states overall good compliance with treatment, good med tolerability, and has been trying to follow appropriate diet.  Pt denies worsening depressive symptoms, suicidal ideation or panic. No fever, night sweats, wt loss, loss of appetite, or other constitutional symptoms.  Pt states good ability with ADL's, has low fall risk, home safety reviewed and adequate, no other significant changes in hearing or vision, and only occasionally active with exercise.  No other hx except: Also, admits to sometimes misses potassium pill.  Also with acute onset 3 days mod to severe pain, swelling, heat to bilat hands and right ankle Past Medical History:  Diagnosis Date  . Allergic rhinitis, cause unspecified 01/31/2013  . ANEMIA-NOS 01/28/2010  . BPH (benign prostatic hypertrophy) 04/25/2013  . CAD 02/17/2010  . CAROTID BRUIT 11/18/2009  . Carpal tunnel syndrome 05/20/2008  . Cervical radiculitis 04/17/2012  . CHEST PAIN 09/26/2009  . DEPRESSION 06/12/2007  . DYSPNEA 07/28/2010  . ERECTILE DYSFUNCTION 02/24/2009  . GERD 06/09/2007  . GERD (gastroesophageal reflux disease)   . GLUCOSE INTOLERANCE 01/28/2010  . HEMORRHOIDS 06/09/2007  . History of colonic polyps 08/07/2015  . HYPERLIPIDEMIA 02/07/2008  . HYPERTENSION 06/09/2007  . HYPOKALEMIA 01/28/2010  . Impaired glucose tolerance 01/23/2011  . LOW BACK PAIN 06/12/2007  . MYOCARDIAL PERFUSION SCAN, WITH STRESS TEST, ABNORMAL 10/08/2009  . PARESTHESIA 04/25/2008  . SHOULDER PAIN, LEFT 04/25/2008  . URI 08/19/2008  . VITAMIN B12 DEFICIENCY 02/09/2010   Past Surgical  History:  Procedure Laterality Date  . COLONOSCOPY    . CORONARY STENT PLACEMENT     from right radial    reports that he has never smoked. He has never used smokeless tobacco. He reports that he does not drink alcohol or use drugs. family history includes Alcohol abuse in his brother and sister; Coronary artery disease in his other; Heart attack in his other; Heart disease in his father and mother; Hyperlipidemia in his father and mother; Hypertension in his father, mother, and other; Stroke in his other. Allergies  Allergen Reactions  . Diclofenac Sodium     REACTION: maks pt drowsey   Current Outpatient Prescriptions on File Prior to Visit  Medication Sig Dispense Refill  . amLODipine (NORVASC) 10 MG tablet TAKE 1 TABLET (10 MG TOTAL) BY MOUTH DAILY. 90 tablet 3  . aspirin EC 81 MG tablet Take 81 mg by mouth daily.    Marland Kitchen azithromycin (ZITHROMAX Z-PAK) 250 MG tablet Take 2 tablets (500 mg) on  Day 1,  followed by 1 tablet (250 mg) once daily on Days 2 through 5. 6 each 0  . carvedilol (COREG) 12.5 MG tablet TAKE 1 TABLET TWICE DAILY 180 tablet 3  . clopidogrel (PLAVIX) 75 MG tablet TAKE 1 TABLET (75 MG TOTAL) BY MOUTH DAILY. 90 tablet 3  . colchicine 0.6 MG tablet Take 1 tablet (0.6 mg total) by mouth daily. (Patient taking differently: Take 0.6 mg by mouth as needed. ) 90 tablet 3  . dexlansoprazole (DEXILANT) 60 MG capsule Take 1 capsule (60 mg total) by mouth daily. 90 capsule 3  .  fexofenadine (ALLEGRA) 180 MG tablet Take 1 tablet (180 mg total) by mouth daily. As needed (Patient taking differently: Take 180 mg by mouth daily as needed for allergies. ) 90 tablet 3  . fluticasone (FLONASE) 50 MCG/ACT nasal spray USE TWO SPRAYS IN EACH NOSTRIL ONCE DAILY 48 g 5  . HYDROcodone-acetaminophen (NORCO/VICODIN) 5-325 MG tablet Take 1 tablet by mouth every 6 (six) hours as needed for moderate pain. 60 tablet 0  . lisinopril-hydrochlorothiazide (PRINZIDE,ZESTORETIC) 10-12.5 MG per tablet Take 1  tablet by mouth 2 (two) times daily.    Marland Kitchen lisinopril-hydrochlorothiazide (PRINZIDE,ZESTORETIC) 10-12.5 MG tablet Take 2 tablets by mouth daily. 180 tablet 2  . meclizine (ANTIVERT) 12.5 MG tablet Take 1 tablet (12.5 mg total) by mouth 3 (three) times daily as needed for dizziness. 30 tablet 1  . meloxicam (MOBIC) 7.5 MG tablet TAKE 1 TO 2 TABLETS ONE TIME DAILY AS NEEDED FOR PAIN 180 tablet 1  . omeprazole (PRILOSEC) 20 MG capsule Take 1 capsule (20 mg total) by mouth daily. 30 capsule 3  . oseltamivir (TAMIFLU) 75 MG capsule Take 1 capsule (75 mg total) by mouth 2 (two) times daily. 10 capsule 0  . potassium chloride (K-DUR) 10 MEQ tablet Take 2 tablets (20 mEq total) by mouth daily. 180 tablet 1  . potassium chloride (K-DUR) 10 MEQ tablet TAKE TWO TABLETS BY MOUTH ONCE DAILY 180 tablet 3  . promethazine-dextromethorphan (PROMETHAZINE-DM) 6.25-15 MG/5ML syrup Take 5 mLs by mouth 4 (four) times daily as needed for cough. 118 mL 0  . rosuvastatin (CRESTOR) 10 MG tablet TAKE 1 TABLET EVERY DAY 90 tablet 1  . tamsulosin (FLOMAX) 0.4 MG CAPS capsule Take 1 capsule (0.4 mg total) by mouth daily. 90 capsule 3  . traMADol (ULTRAM) 50 MG tablet Take 1 tablet (50 mg total) by mouth every 6 (six) hours as needed. 40 tablet 0   No current facility-administered medications on file prior to visit.    Review of Systems Constitutional: Negative for other unusual diaphoresis, sweats, appetite or weight changes HENT: Negative for other worsening hearing loss, ear pain, facial swelling, mouth sores or neck stiffness.   Eyes: Negative for other worsening pain, redness or other visual disturbance.  Respiratory: Negative for other stridor or swelling Cardiovascular: Negative for other palpitations or other chest pain  Gastrointestinal: Negative for worsening diarrhea or loose stools, blood in stool, distention or other pain Genitourinary: Negative for hematuria, flank pain or other change in urine volume.    Musculoskeletal: Negative for myalgias or other joint swelling.  Skin: Negative for other color change, or other wound or worsening drainage.  Neurological: Negative for other syncope or numbness. Hematological: Negative for other adenopathy or swelling Psychiatric/Behavioral: Negative for hallucinations, other worsening agitation, SI, self-injury, or new decreased concentration All other system neg per pt    Objective:   Physical Exam BP 140/70 (BP Location: Right Arm, Patient Position: Sitting, Cuff Size: Normal)   Pulse 72   Temp 98.1 F (36.7 C) (Oral)   Resp 12   Ht 5\' 8"  (1.727 m)   Wt 208 lb (94.3 kg)   SpO2 99%   BMI 31.63 kg/m  VS noted,  Constitutional: Pt is oriented to person, place, and time. Appears well-developed and well-nourished, in no significant distress and comfortable Head: Normocephalic and atraumatic  Eyes: Conjunctivae and EOM are normal. Pupils are equal, round, and reactive to light Right Ear: External ear normal without discharge Left Ear: External ear normal without discharge Nose: Nose without discharge  or deformity Mouth/Throat: Oropharynx is without other ulcerations and moist  Neck: Normal range of motion. Neck supple. No JVD present. No tracheal deviation present or significant neck LA or mass Cardiovascular: Normal rate, regular rhythm, normal heart sounds and intact distal pulses.   Pulmonary/Chest: WOB normal and breath sounds without rales or wheezing  Abdominal: Soft. Bowel sounds are normal. NT. No HSM  Musculoskeletal: Normal range of motion. Exhibits no edema except for 1-2+ bilat multiple MCP's swelling and tenderness, and right ankle swelling/tender Lymphadenopathy: Has no other cervical adenopathy.  Neurological: Pt is alert and oriented to person, place, and time. Pt has normal reflexes. No cranial nerve deficit. Motor grossly intact, Gait intact Skin: Skin is warm and dry. No rash noted or new ulcerations Psychiatric:  Has normal  mood and affect. Behavior is normal without agitation No other exam findings    Assessment & Plan:

## 2017-01-11 NOTE — Progress Notes (Signed)
Pre visit review using our clinic review tool, if applicable. No additional management support is needed unless otherwise documented below in the visit note. 

## 2017-01-17 NOTE — Assessment & Plan Note (Signed)

## 2017-01-17 NOTE — Assessment & Plan Note (Signed)
stable overall by history and exam, recent data reviewed with pt, and pt to continue medical treatment as before,  to f/u any worsening symptoms or concerns Lab Results  Component Value Date   HGBA1C 6.4 01/11/2017

## 2017-01-17 NOTE — Assessment & Plan Note (Addendum)
With moderate to severe flare, mult areas, for depomedrol IM 80, predpac asd, pain control,  to f/u any worsening symptoms or concerns; ok to start allopurinol for prevention

## 2017-01-17 NOTE — Assessment & Plan Note (Signed)
Lab Results  Component Value Date   K 3.3 (L) 01/11/2017  encouraged med compliacne

## 2017-01-19 ENCOUNTER — Other Ambulatory Visit: Payer: Self-pay | Admitting: Internal Medicine

## 2017-02-03 ENCOUNTER — Ambulatory Visit (INDEPENDENT_AMBULATORY_CARE_PROVIDER_SITE_OTHER): Payer: Medicare HMO | Admitting: Internal Medicine

## 2017-02-03 ENCOUNTER — Encounter: Payer: Self-pay | Admitting: Internal Medicine

## 2017-02-03 VITALS — BP 136/64 | HR 63 | Ht 68.0 in | Wt 202.0 lb

## 2017-02-03 DIAGNOSIS — M109 Gout, unspecified: Secondary | ICD-10-CM

## 2017-02-03 DIAGNOSIS — E1165 Type 2 diabetes mellitus with hyperglycemia: Secondary | ICD-10-CM | POA: Diagnosis not present

## 2017-02-03 DIAGNOSIS — IMO0002 Reserved for concepts with insufficient information to code with codable children: Secondary | ICD-10-CM

## 2017-02-03 DIAGNOSIS — I10 Essential (primary) hypertension: Secondary | ICD-10-CM | POA: Diagnosis not present

## 2017-02-03 DIAGNOSIS — E1159 Type 2 diabetes mellitus with other circulatory complications: Secondary | ICD-10-CM

## 2017-02-03 MED ORDER — POTASSIUM CHLORIDE ER 10 MEQ PO TBCR: 20.0000 meq | EXTENDED_RELEASE_TABLET | Freq: Every day | ORAL | 3 refills | Status: DC

## 2017-02-03 NOTE — Patient Instructions (Addendum)
Please continue all other medications as before, and refills have been done if requested.  Please have the pharmacy call with any other refills you may need.  Please continue your efforts at being more active, low cholesterol diet, and weight control..  Please keep your appointments with your specialists as you may have planned  Please return in 6 months, or sooner if needed, with Lab testing done 3-5 days before  

## 2017-02-03 NOTE — Assessment & Plan Note (Signed)
stable overall by history and exam, recent data reviewed with pt, and pt to continue medical treatment as before,  to f/u any worsening symptoms or concerns, cw resolved today

## 2017-02-03 NOTE — Progress Notes (Signed)
Subjective:    Patient ID: Travis Carey, male    DOB: 10-Nov-1940, 76 y.o.   MRN: [id number]  HPI Here to f/u; overall doing ok,  Pt denies chest pain, increasing sob or doe, wheezing, orthopnea, PND, increased LE swelling, palpitations, dizziness or syncope.  Pt denies new neurological symptoms such as new headache, or facial or extremity weakness or numbness.  Pt denies polydipsia, polyuria, or low sugar episode.   Pt denies new neurological symptoms such as new headache, or facial or extremity weakness or numbness.   Pt states overall good compliance with meds, mostly trying to follow appropriate diet, with wt overall stable, Gout resolved Tolerating allopurinol well. Wt Readings from Last 3 Encounters:  02/03/17 202 lb (91.6 kg)  01/11/17 208 lb (94.3 kg)  10/12/16 209 lb (94.8 kg)   BP Readings from Last 3 Encounters:  02/03/17 136/64  01/11/17 140/70  10/12/16 (!) 159/73   Past Medical History:  Diagnosis Date  . Allergic rhinitis, cause unspecified 01/31/2013  . ANEMIA-NOS 01/28/2010  . BPH (benign prostatic hypertrophy) 04/25/2013  . CAD 02/17/2010  . CAROTID BRUIT 11/18/2009  . Carpal tunnel syndrome 05/20/2008  . Cervical radiculitis 04/17/2012  . CHEST PAIN 09/26/2009  . DEPRESSION 06/12/2007  . DYSPNEA 07/28/2010  . ERECTILE DYSFUNCTION 02/24/2009  . GERD 06/09/2007  . GERD (gastroesophageal reflux disease)   . GLUCOSE INTOLERANCE 01/28/2010  . HEMORRHOIDS 06/09/2007  . History of colonic polyps 08/07/2015  . HYPERLIPIDEMIA 02/07/2008  . HYPERTENSION 06/09/2007  . HYPOKALEMIA 01/28/2010  . Impaired glucose tolerance 01/23/2011  . LOW BACK PAIN 06/12/2007  . MYOCARDIAL PERFUSION SCAN, WITH STRESS TEST, ABNORMAL 10/08/2009  . PARESTHESIA 04/25/2008  . SHOULDER PAIN, LEFT 04/25/2008  . URI 08/19/2008  . VITAMIN B12 DEFICIENCY 02/09/2010   Past Surgical History:  Procedure Laterality Date  . COLONOSCOPY    . CORONARY STENT PLACEMENT     from right radial    reports that he has never  smoked. He has never used smokeless tobacco. He reports that he does not drink alcohol or use drugs. family history includes Alcohol abuse in his brother and sister; Coronary artery disease in his other; Heart attack in his other; Heart disease in his father and mother; Hyperlipidemia in his father and mother; Hypertension in his father, mother, and other; Stroke in his other. Allergies  Allergen Reactions  . Diclofenac Sodium     REACTION: maks pt drowsey   Current Outpatient Prescriptions on File Prior to Visit  Medication Sig Dispense Refill  . allopurinol (ZYLOPRIM) 300 MG tablet Take 1 tablet (300 mg total) by mouth daily. 90 tablet 3  . amLODipine (NORVASC) 10 MG tablet TAKE 1 TABLET (10 MG TOTAL) BY MOUTH DAILY. 90 tablet 3  . aspirin EC 81 MG tablet Take 81 mg by mouth daily.    . carvedilol (COREG) 12.5 MG tablet TAKE 1 TABLET TWICE DAILY 180 tablet 3  . clopidogrel (PLAVIX) 75 MG tablet TAKE 1 TABLET (75 MG TOTAL) BY MOUTH DAILY. 90 tablet 3  . colchicine 0.6 MG tablet Take 1 tablet (0.6 mg total) by mouth daily. (Patient taking differently: Take 0.6 mg by mouth as needed. ) 90 tablet 3  . dexlansoprazole (DEXILANT) 60 MG capsule Take 1 capsule (60 mg total) by mouth daily. 90 capsule 3  . fexofenadine (ALLEGRA) 180 MG tablet Take 1 tablet (180 mg total) by mouth daily. As needed (Patient taking differently: Take 180 mg by mouth daily as needed for allergies. )  90 tablet 3  . fluticasone (FLONASE) 50 MCG/ACT nasal spray USE TWO SPRAYS IN EACH NOSTRIL ONCE DAILY 48 g 5  . HYDROcodone-acetaminophen (NORCO/VICODIN) 5-325 MG tablet Take 1 tablet by mouth every 6 (six) hours as needed for moderate pain. 60 tablet 0  . lisinopril-hydrochlorothiazide (PRINZIDE,ZESTORETIC) 10-12.5 MG tablet Take 2 tablets by mouth daily. 180 tablet 2  . meclizine (ANTIVERT) 12.5 MG tablet Take 1 tablet (12.5 mg total) by mouth 3 (three) times daily as needed for dizziness. 30 tablet 1  . meloxicam (MOBIC) 7.5  MG tablet TAKE 1 TO 2 TABLETS ONE TIME DAILY AS NEEDED FOR PAIN 180 tablet 3  . omeprazole (PRILOSEC) 20 MG capsule Take 1 capsule (20 mg total) by mouth daily. 30 capsule 3  . rosuvastatin (CRESTOR) 10 MG tablet TAKE 1 TABLET EVERY DAY 90 tablet 3  . tamsulosin (FLOMAX) 0.4 MG CAPS capsule Take 1 capsule (0.4 mg total) by mouth daily. 90 capsule 3  . traMADol (ULTRAM) 50 MG tablet Take 1 tablet (50 mg total) by mouth every 6 (six) hours as needed. 40 tablet 0   No current facility-administered medications on file prior to visit.    Review of Systems  Constitutional: Negative for other unusual diaphoresis or sweats HENT: Negative for ear discharge or swelling Eyes: Negative for other worsening visual disturbances Respiratory: Negative for stridor or other swelling  Gastrointestinal: Negative for worsening distension or other blood Genitourinary: Negative for retention or other urinary change Musculoskeletal: Negative for other MSK pain or swelling Skin: Negative for color change or other new lesions Neurological: Negative for worsening tremors and other numbness  Psychiatric/Behavioral: Negative for worsening agitation or other fatigue All other system neg per pt    Objective:   Physical Exam BP 136/64   Pulse 63   Ht 5\' 8"  (1.727 m)   Wt 202 lb (91.6 kg)   SpO2 99%   BMI 30.71 kg/m  VS noted,  Constitutional: Pt appears in NAD HENT: Head: NCAT.  Right Ear: External ear normal.  Left Ear: External ear normal.  Eyes: . Pupils are equal, round, and reactive to light. Conjunctivae and EOM are normal Nose: without d/c or deformity Neck: Neck supple. Gross normal ROM Cardiovascular: Normal rate and regular rhythm.   Pulmonary/Chest: Effort normal and breath sounds without rales or wheezing.  Abd:  Soft, NT, ND, + BS, no organomegaly No joint effusions or tenderness Neurological: Pt is alert. At baseline orientation, motor grossly intact Skin: Skin is warm. No rashes, other new  lesions, no LE edema Psychiatric: Pt behavior is normal without agitation  No other exam findings    Assessment & Plan:

## 2017-02-03 NOTE — Progress Notes (Signed)
Pre visit review using our clinic review tool, if applicable. No additional management support is needed unless otherwise documented below in the visit note. 

## 2017-02-03 NOTE — Assessment & Plan Note (Signed)
stable overall by history and exam, recent data reviewed with pt, and pt to continue medical treatment as before,  to f/u any worsening symptoms or concerns Lab Results  Component Value Date   HGBA1C 6.4 01/11/2017

## 2017-02-03 NOTE — Assessment & Plan Note (Signed)
stable overall by history and exam, recent data reviewed with pt, and pt to continue medical treatment as before,  to f/u any worsening symptoms or concerns BP Readings from Last 3 Encounters:  02/03/17 136/64  01/11/17 140/70  10/12/16 (!) 159/73

## 2017-02-05 NOTE — Progress Notes (Signed)
Cardiology Office Note  Date:  02/07/2017   ID:  JAYMOND WAAGE, DOB 02-Oct-1941, MRN [id number]  PCP:  Cathlean Cower, MD   Chief Complaint  Patient presents with  . other    6 month follow up. Patient was in the hospital 10/12/16 with the flu. Pt c/o acid reflux, sob, chest tightness. Reviewed meds with pt verbally.    HPI:  Mr. Kucinski is a 76 year old gentleman with  coronary artery disease, stent to the RCA,  hyperlipidemia  H/o unstable angina with stenting of the RCA on 10/31/2009.   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  On his last clinic visit he reported having chest discomfort in the afternoon's typically after lunch Symptoms seem to get better with antacids On today's visit he reports having discomfort in the mornings He eats large bowl of cereal followed by high-volume coffee  Lab work reviewed with him in detail LDL 49, total chol 149, HBA1C 6.4 Potassium 3.3, he is on HCTZ daily Supposed to take 2 potassiums per day, sometimes only takes 1  Denies any TIA or stroke type symptoms Retiring this summer No regular exercise program  EKG personally reviewed by myself on todays visit Shows normal sinus rhythm with rate 59 bpm left anterior fascicular block, no significant ST or T-wave changes  Other past medical history reviewed Previous stroke symptoms, evaluated in the hospital in June 2016 He had slurred speech, difficulty swallowing  Echocardiogram was essentially normal Discharged with aspirin and Plavix  Denies any significant  palpitations, tachycardia  EKG on today's visit shows normal sinus rhythm with right bundle branch block, left anterior fascicular block, rate 59 bpm  Other past medical history He works as a Dealer. He continues to have  hand swelling, possible carpal tunnel issues.  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  Has several dogs at home  Prior lab work showing total cholesterol 153, LDL 89 which is higher from previous lab work    PMH:   has a past medical history of Allergic rhinitis, cause unspecified (01/31/2013); ANEMIA-NOS (01/28/2010); BPH (benign prostatic hypertrophy) (04/25/2013); CAD (02/17/2010); CAROTID BRUIT (11/18/2009); Carpal tunnel syndrome (05/20/2008); Cervical radiculitis (04/17/2012); CHEST PAIN (09/26/2009); DEPRESSION (06/12/2007); DYSPNEA (07/28/2010); ERECTILE DYSFUNCTION (02/24/2009); GERD (06/09/2007); GERD (gastroesophageal reflux disease); GLUCOSE INTOLERANCE (01/28/2010); Gout (2017); HEMORRHOIDS (06/09/2007); History of colonic polyps (08/07/2015); HYPERLIPIDEMIA (02/07/2008); HYPERTENSION (06/09/2007); HYPOKALEMIA (01/28/2010); Impaired glucose tolerance (01/23/2011); LOW BACK PAIN (06/12/2007); MYOCARDIAL PERFUSION SCAN, WITH STRESS TEST, ABNORMAL (10/08/2009); PARESTHESIA (04/25/2008); SHOULDER PAIN, LEFT (04/25/2008); URI (08/19/2008); and VITAMIN B12 DEFICIENCY (02/09/2010).  PSH:    Past Surgical History:  Procedure Laterality Date  . COLONOSCOPY    . CORONARY STENT PLACEMENT     from right radial    Current Outpatient Prescriptions  Medication Sig Dispense Refill  . allopurinol (ZYLOPRIM) 300 MG tablet Take 1 tablet (300 mg total) by mouth daily. 90 tablet 3  . amLODipine (NORVASC) 10 MG tablet TAKE 1 TABLET (10 MG TOTAL) BY MOUTH DAILY. 90 tablet 3  . aspirin EC 81 MG tablet Take 81 mg by mouth daily.    . carvedilol (COREG) 12.5 MG tablet TAKE 1 TABLET TWICE DAILY 180 tablet 3  . clopidogrel (PLAVIX) 75 MG tablet TAKE 1 TABLET (75 MG TOTAL) BY MOUTH DAILY. [place]  tablet 3  . colchicine 0.6 MG tablet Take 1 tablet (0.6 mg total) by mouth daily. (Patient taking differently: Take 0.6 mg by mouth as needed. ) 90 tablet 3  . dexlansoprazole (DEXILANT) 60 MG capsule Take 1  capsule (60 mg total) by mouth daily. 90 capsule 3  . fexofenadine (ALLEGRA) 180 MG tablet Take 1 tablet (180 mg total) by mouth daily. As needed (Patient taking differently: Take 180 mg by mouth daily as needed for allergies. ) 90 tablet 3  . fluticasone (FLONASE) 50 MCG/ACT nasal spray USE TWO SPRAYS IN EACH NOSTRIL ONCE DAILY 48 g 5  . HYDROcodone-acetaminophen (NORCO/VICODIN) 5-325 MG tablet Take 1 tablet by mouth every 6 (six) hours as needed for moderate pain. 60 tablet 0  . lisinopril-hydrochlorothiazide (PRINZIDE,ZESTORETIC) 10-12.5 MG tablet Take 2 tablets by mouth daily. 180 tablet 2  . meclizine (ANTIVERT) 12.5 MG tablet Take 1 tablet (12.5 mg total) by mouth 3 (three) times daily as needed for dizziness. 30 tablet 1  . meloxicam (MOBIC) 7.5 MG tablet TAKE 1 TO 2 TABLETS ONE TIME DAILY AS NEEDED FOR PAIN 180 tablet 3  . omeprazole (PRILOSEC) 20 MG capsule Take 1 capsule (20 mg total) by mouth daily. 30 capsule 3  . potassium chloride (K-DUR) 10 MEQ tablet Take 2 tablets (20 mEq total) by mouth daily. 180 tablet 3  . rosuvastatin (CRESTOR) 10 MG tablet TAKE 1 TABLET EVERY DAY 90 tablet 3  . tamsulosin (FLOMAX) 0.4 MG CAPS capsule Take 1 capsule (0.4 mg total) by mouth daily. 90 capsule 3  . traMADol (ULTRAM) 50 MG tablet Take 1 tablet (50 mg total) by mouth every 6 (six) hours as needed. 40 tablet 0   No current facility-administered medications for this visit.      Allergies:   Diclofenac sodium   Social History:  The patient  reports that he has never smoked. He has never used smokeless tobacco. He reports that he does not drink alcohol or use drugs.   Family History:   family history includes Alcohol abuse in his brother and sister; Coronary artery disease in his other; Heart attack in his other; Heart disease in his father and mother; Hyperlipidemia in his father and mother; Hypertension in his father, mother, and other; Stroke in his other.    Review of Systems: Review of  Systems  Constitutional: Negative.   Respiratory: Negative.   Cardiovascular: Negative.   Gastrointestinal: Positive for heartburn.  Musculoskeletal: Negative.   Neurological: Negative.   Psychiatric/Behavioral: Negative.   All other systems reviewed and are negative.    PHYSICAL EXAM: VS:  BP (!) 142/72 (BP Location: Left Arm, Patient Position: Sitting, Cuff Size: Normal)   Pulse (!) 59   Ht 5\' 8"  (1.727 m)   Wt 202 lb 8 oz (91.9 kg)   BMI 30.79 kg/m  , BMI Body mass index is 30.79 kg/m. GEN: Well nourished, well developed, in no acute distress  HEENT: normal  Neck: no JVD, carotid bruits, or masses Cardiac: RRR; no murmurs, rubs, or gallops,no edema  Respiratory:  clear to auscultation bilaterally, normal work of breathing GI: soft, nontender, nondistended, + BS MS: no deformity or atrophy  Skin: warm and dry, no rash Neuro:  Strength and sensation are intact Psych: euthymic mood, full affect    Recent Labs: 01/11/2017: ALT 11; BUN 15; Creatinine, Ser 1.03; Hemoglobin 13.0; Platelets 217.0; Potassium 3.3; Sodium 144; TSH 1.98    Lipid Panel Lab Results  Component Value Date   CHOL  149 01/11/2017   HDL 28.90 (L) 01/11/2017   LDLCALC 64 04/08/2015   TRIG 355.0 (H) 01/11/2017      Wt Readings from Last 3 Encounters:  02/07/17 202 lb 8 oz (91.9 kg)  02/03/17 202 lb (91.6 kg)  01/11/17 208 lb (94.3 kg)       ASSESSMENT AND PLAN:  Atherosclerosis of native coronary artery of native heart with stable angina pectoris ([place]) - Plan: EKG 12-Lead Currently with no symptoms of angina. No further workup at this time. Continue current medication regimen.  Essential hypertension - Plan: EKG 12-Lead Blood pressure high end of normal range Recommended exercise, weight loss, monitoring blood pressure at home Collis numbers continue to run high  Mixed hyperlipidemia - Plan: EKG 12-Lead Goal LDL less than 70 His LDL is 49  Other emphysema ([facility]) - Plan: EKG 12-Lead We  have encouraged him to continue to work on weaning his cigarettes and smoking cessation. He will continue to work on this and does not want any assistance with chantix.   Gastroesophageal reflux disease without esophagitis long discussion concerning his symptoms . They do not sound cardiac in nature  Recommended he continue Pepcid and Tums   if he would like he could try omeprazole   Total encounter time more than 25 minutes  Greater than 50% was spent in counseling and coordination of care with the patient  Disposition:   F/U  12 months   Orders Placed This Encounter  Procedures  . EKG 12-Lead     Signed, Esmond Plants, M.D., Ph.D. 02/07/2017  [place], [address]

## 2017-02-07 ENCOUNTER — Ambulatory Visit (INDEPENDENT_AMBULATORY_CARE_PROVIDER_SITE_OTHER): Payer: Medicare HMO | Admitting: Cardiovascular Disease

## 2017-02-07 ENCOUNTER — Encounter: Payer: Self-pay | Admitting: Cardiovascular Disease

## 2017-02-07 VITALS — BP 142/72 | HR 59 | Ht 68.0 in | Wt 202.5 lb

## 2017-02-07 DIAGNOSIS — K219 Gastro-esophageal reflux disease without esophagitis: Secondary | ICD-10-CM

## 2017-02-07 DIAGNOSIS — I10 Essential (primary) hypertension: Secondary | ICD-10-CM

## 2017-02-07 DIAGNOSIS — I25118 Atherosclerotic heart disease of native coronary artery with other forms of angina pectoris: Secondary | ICD-10-CM

## 2017-02-07 DIAGNOSIS — E782 Mixed hyperlipidemia: Secondary | ICD-10-CM

## 2017-02-07 DIAGNOSIS — J438 Other emphysema: Secondary | ICD-10-CM

## 2017-02-07 NOTE — Patient Instructions (Addendum)
Medication Instructions:   No medication changes made  For reflux, try omeprazole  Labwork:  No new labs needed  Testing/Procedures:  No further testing at this time   I recommend watching educational videos on topics of interest to you at:       www.goemmi.com  Enter code: [email or web address]    Follow-Up: It was a pleasure seeing you in the office today. Please call us if you have new issues that need to be addressed before your next appt.  [phone]  Your physician wants you to follow-up in: 12 months.  You will receive a reminder letter in the mail two months in advance. If you don't receive a letter, please call our office to schedule the follow-up appointment.  If you need a refill on your cardiac medications before your next appointment, please call your pharmacy.

## 2017-02-14 ENCOUNTER — Other Ambulatory Visit: Payer: Self-pay | Admitting: Internal Medicine

## 2017-02-22 ENCOUNTER — Telehealth: Payer: Self-pay | Admitting: Internal Medicine

## 2017-02-22 NOTE — Telephone Encounter (Signed)
Called patient regarding two appts scheduled on 08/11/2017. One for 9:15 am and at 10:15 am. Patient stated that he and his wife both wanted to schedule their appts around the same time. Cx appt for 10:15am. Wife already had scheduled appt for 08/11/2017.

## 2017-05-03 ENCOUNTER — Encounter: Payer: Self-pay | Admitting: Internal Medicine

## 2017-05-03 ENCOUNTER — Ambulatory Visit (INDEPENDENT_AMBULATORY_CARE_PROVIDER_SITE_OTHER): Payer: Medicare HMO | Admitting: Internal Medicine

## 2017-05-03 DIAGNOSIS — I10 Essential (primary) hypertension: Secondary | ICD-10-CM

## 2017-05-03 DIAGNOSIS — J438 Other emphysema: Secondary | ICD-10-CM | POA: Diagnosis not present

## 2017-05-03 DIAGNOSIS — M67971 Unspecified disorder of synovium and tendon, right ankle and foot: Secondary | ICD-10-CM | POA: Diagnosis not present

## 2017-05-03 MED ORDER — PREDNISONE 10 MG PO TABS: ORAL_TABLET | ORAL | 0 refills | Status: DC

## 2017-05-03 NOTE — Patient Instructions (Signed)
Please take all new medication as prescribed  - the prednisone  Please make appt with Dr Smith/sports medicine in this office if not improved or keeps coming back  Please continue all other medications as before, and refills have been done if requested.  Please have the pharmacy call with any other refills you may need.  Please keep your appointments with your specialists as you may have planned

## 2017-05-03 NOTE — Progress Notes (Signed)
Subjective:    Patient ID: Travis Carey, male    DOB: [birth year], 76 y.o.   MRN: [id number]  HPI  Here to f/u originally with left LBP a few days ago that has since resolved. Pt continues to have recurring LBP but no bowel or bladder change, fever, wt loss,  worsening LE pain/numbness/weakness, gait change or falls. At least today is resolved.  Also c/o right achilles heal pain and tenderness he referrs to as gout, colchicine no help recently, states prednisone really helped last time.  Also mentions URI symptoms last wk of fever, ST, mild cough but essentially resolved today as well.  Pt denies chest pain, increased sob or doe, wheezing, orthopnea, PND, increased LE swelling, palpitations, dizziness or syncope.  Pt denies new neurological symptoms such as new headache, or facial or extremity weakness or numbness   Pt denies polydipsia, polyuria Past Medical History:  Diagnosis Date  . Allergic rhinitis, cause unspecified 01/31/2013  . ANEMIA-NOS 01/28/2010  . BPH (benign prostatic hypertrophy) 04/25/2013  . CAD 02/17/2010  . CAROTID BRUIT 11/18/2009  . Carpal tunnel syndrome 05/20/2008  . Cervical radiculitis 04/17/2012  . CHEST PAIN 09/26/2009  . DEPRESSION 06/12/2007  . DYSPNEA 07/28/2010  . ERECTILE DYSFUNCTION 02/24/2009  . GERD 06/09/2007  . GERD (gastroesophageal reflux disease)   . GLUCOSE INTOLERANCE 01/28/2010  . Gout 2017   Dr.Stephon Weathers  . HEMORRHOIDS 06/09/2007  . History of colonic polyps 08/07/2015  . HYPERLIPIDEMIA 02/07/2008  . HYPERTENSION 06/09/2007  . HYPOKALEMIA 01/28/2010  . Impaired glucose tolerance 01/23/2011  . LOW BACK PAIN 06/12/2007  . MYOCARDIAL PERFUSION SCAN, WITH STRESS TEST, ABNORMAL 10/08/2009  . PARESTHESIA 04/25/2008  . SHOULDER PAIN, LEFT 04/25/2008  . URI 08/19/2008  . VITAMIN B12 DEFICIENCY 02/09/2010   Past Surgical History:  Procedure Laterality Date  . COLONOSCOPY    . CORONARY STENT PLACEMENT     from right radial    reports that he has never smoked. He has  never used smokeless tobacco. He reports that he does not drink alcohol or use drugs. family history includes Alcohol abuse in his brother and sister; Coronary artery disease in his other; Heart attack in his other; Heart disease in his father and mother; Hyperlipidemia in his father and mother; Hypertension in his father, mother, and other; Stroke in his other. Allergies  Allergen Reactions  . Diclofenac Sodium     REACTION: maks pt drowsey   Current Outpatient Prescriptions on File Prior to Visit  Medication Sig Dispense Refill  . allopurinol (ZYLOPRIM) 300 MG tablet Take 1 tablet (300 mg total) by mouth daily. 90 tablet 3  . amLODipine (NORVASC) 10 MG tablet TAKE 1 TABLET (10 MG TOTAL) BY MOUTH DAILY. 90 tablet 3  . aspirin EC 81 MG tablet Take 81 mg by mouth daily.    . carvedilol (COREG) 12.5 MG tablet TAKE 1 TABLET TWICE DAILY 180 tablet 3  . clopidogrel (PLAVIX) 75 MG tablet TAKE 1 TABLET (75 MG TOTAL) BY MOUTH DAILY. 90 tablet 3  . colchicine 0.6 MG tablet Take 1 tablet (0.6 mg total) by mouth daily. (Patient taking differently: Take 0.6 mg by mouth as needed. ) 90 tablet 3  . dexlansoprazole (DEXILANT) 60 MG capsule Take 1 capsule (60 mg total) by mouth daily. 90 capsule 3  . fexofenadine (ALLEGRA) 180 MG tablet Take 1 tablet (180 mg total) by mouth daily. As needed (Patient taking differently: Take 180 mg by mouth daily as needed for allergies. ) 90 tablet  3  . fluticasone (FLONASE) 50 MCG/ACT nasal spray USE TWO SPRAYS IN EACH NOSTRIL ONCE DAILY 48 g 5  . HYDROcodone-acetaminophen (NORCO/VICODIN) 5-325 MG tablet Take 1 tablet by mouth every 6 (six) hours as needed for moderate pain. 60 tablet 0  . lisinopril-hydrochlorothiazide (PRINZIDE,ZESTORETIC) 10-12.5 MG tablet Take 2 tablets by mouth daily. 180 tablet 2  . meclizine (ANTIVERT) 12.5 MG tablet Take 1 tablet (12.5 mg total) by mouth 3 (three) times daily as needed for dizziness. 30 tablet 1  . meloxicam (MOBIC) 7.5 MG tablet TAKE  1 TO 2 TABLETS ONE TIME DAILY AS NEEDED FOR PAIN 180 tablet 3  . omeprazole (PRILOSEC) 20 MG capsule Take 1 capsule (20 mg total) by mouth daily. 30 capsule 3  . potassium chloride (K-DUR) 10 MEQ tablet Take 2 tablets (20 mEq total) by mouth daily. 180 tablet 3  . rosuvastatin (CRESTOR) 10 MG tablet TAKE 1 TABLET EVERY DAY 90 tablet 3  . tamsulosin (FLOMAX) 0.4 MG CAPS capsule TAKE ONE CAPSULE BY MOUTH ONCE DAILY 90 capsule 3  . traMADol (ULTRAM) 50 MG tablet Take 1 tablet (50 mg total) by mouth every 6 (six) hours as needed. 40 tablet 0   No current facility-administered medications on file prior to visit.    Review of Systems  Constitutional: Negative for other unusual diaphoresis or sweats HENT: Negative for ear discharge or swelling Eyes: Negative for other worsening visual disturbances Respiratory: Negative for stridor or other swelling  Gastrointestinal: Negative for worsening distension or other blood Genitourinary: Negative for retention or other urinary change Musculoskeletal: Negative for other MSK pain or swelling Skin: Negative for color change or other new lesions Neurological: Negative for worsening tremors and other numbness  Psychiatric/Behavioral: Negative for worsening agitation or other fatigue All other system neg per pt    Objective:   Physical Exam BP 140/78   Pulse 64   Ht 5\' 8"  (1.727 m)   Wt 206 lb (93.4 kg)   SpO2 97%   BMI 31.32 kg/m  VS noted, not ill appearing Constitutional: Pt appears in NAD HENT: Head: NCAT.  Right Ear: External ear normal.  Left Ear: External ear normal.  Eyes: . Pupils are equal, round, and reactive to light. Conjunctivae and EOM are normal Nose: without d/c or deformity Neck: Neck supple. Gross normal ROM Cardiovascular: Normal rate and regular rhythm.   Pulmonary/Chest: Effort normal and breath sounds without rales or wheezing.  Abd:  Soft, NT, ND, + BS, no organomegaly Right achilles insertion site marked tender, mild  swelling without ulcer, erythema or other skin change  Spine nontender and no appreciable bilat lumbar tenderness or swlelling or rash Neurological: Pt is alert. At baseline orientation, motor grossly intact Skin: Skin is warm. No rashes, other new lesions, no LE edema Psychiatric: Pt behavior is normal without agitation  No other exam findings  Lab Results  Component Value Date   WBC 5.9 01/11/2017   HGB 13.0 01/11/2017   HCT 37.3 (L) 01/11/2017   PLT 217.0 01/11/2017   GLUCOSE 87 01/11/2017   CHOL 149 01/11/2017   TRIG 355.0 (H) 01/11/2017   HDL 28.90 (L) 01/11/2017   LDLDIRECT 49.0 01/11/2017   LDLCALC 64 04/08/2015   ALT 11 01/11/2017   AST 16 01/11/2017   NA 144 01/11/2017   K 3.3 (L) 01/11/2017   CL 104 01/11/2017   CREATININE 1.03 01/11/2017   BUN 15 01/11/2017   CO2 34 (H) 01/11/2017   TSH 1.98 01/11/2017   PSA  0.56 01/11/2017   INR 1.0 ratio 10/23/2009   HGBA1C 6.4 01/11/2017   MICROALBUR 0.9 01/11/2017        Assessment & Plan:

## 2017-05-04 NOTE — Assessment & Plan Note (Signed)
stable overall by history and exam, recent data reviewed with pt, and pt to continue medical treatment as before,  to f/u any worsening symptoms or concerns  

## 2017-05-04 NOTE — Assessment & Plan Note (Signed)
I suspect tendonitis, ok for short course predpac asd,  to f/u any worsening symptoms or concerns

## 2017-05-04 NOTE — Assessment & Plan Note (Signed)
stable overall by history and exam, recent data reviewed with pt, and pt to continue medical treatment as before,  to f/u any worsening symptoms or concerns BP Readings from Last 3 Encounters:  05/03/17 140/78  02/07/17 (!) 142/72  02/03/17 136/64

## 2017-06-07 NOTE — Progress Notes (Signed)
[facility] [place],  [id number] Phone: [phone] Subjective:    I'm seeing this patient by the request  of:  Biagio Borg, MD   CC: Right leg pain  BDZ:[id number]  Travis Carey is a 76 y.o. male coming in with complaint of right heel pain Patient states it seems to radiate up his leg. Seems to be worse in the morning. Can give him discomfort that throughout the day. Patient has had increasing swelling he has numbness as well. Denies any numbness. Rates the severity pain is 7 out of 10. Not responding to over-the-counter medications.       Past Medical History:  Diagnosis Date  . Allergic rhinitis, cause unspecified 01/31/2013  . ANEMIA-NOS 01/28/2010  . BPH (benign prostatic hypertrophy) 04/25/2013  . CAD 02/17/2010  . CAROTID BRUIT 11/18/2009  . Carpal tunnel syndrome 05/20/2008  . Cervical radiculitis 04/17/2012  . CHEST PAIN 09/26/2009  . DEPRESSION 06/12/2007  . DYSPNEA 07/28/2010  . ERECTILE DYSFUNCTION 02/24/2009  . GERD 06/09/2007  . GERD (gastroesophageal reflux disease)   . GLUCOSE INTOLERANCE 01/28/2010  . Gout 14-Jan-2016   Dr.John  . HEMORRHOIDS 06/09/2007  . History of colonic polyps 08/07/2015  . HYPERLIPIDEMIA 02/07/2008  . HYPERTENSION 06/09/2007  . HYPOKALEMIA 01/28/2010  . Impaired glucose tolerance 01/23/2011  . LOW BACK PAIN 06/12/2007  . MYOCARDIAL PERFUSION SCAN, WITH STRESS TEST, ABNORMAL 10/08/2009  . PARESTHESIA 04/25/2008  . SHOULDER PAIN, LEFT 04/25/2008  . URI 08/19/2008  . VITAMIN B12 DEFICIENCY 02/09/2010   Past Surgical History:  Procedure Laterality Date  . COLONOSCOPY    . CORONARY STENT PLACEMENT     from right radial   Social History   Social History  . Marital status: Widowed    Spouse name: N/A  . Number of children: 5  . Years of education: N/A   Occupational History  . Investment banker, operational    Social History Main Topics  . Smoking status: Never Smoker  . Smokeless tobacco: Never Used  .  Alcohol use No  . Drug use: No  . Sexual activity: Not Asked   Other Topics Concern  . None   Social History Narrative   Lives in [place]. 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      Wife died January 14, 2008   Allergies  Allergen Reactions  . Diclofenac Sodium     REACTION: maks pt drowsey   Family History  Problem Relation Age of Onset  . Alcohol abuse Sister        ETOH dependence  . Alcohol abuse Brother        ETOH  dependence  . Heart attack Other   . Stroke Other   . Hypertension Other   . Coronary artery disease Other   . Hyperlipidemia Mother   . Heart disease Mother   . Hypertension Mother   . Hyperlipidemia Father   . Heart disease Father   . Hypertension Father      Past medical history, social, surgical and family history all reviewed in electronic medical record.  No pertanent information unless stated regarding to the chief complaint.   Review of Systems:Review of systems updated and as accurate as of 06/08/17  No headache, visual changes, nausea, vomiting, diarrhea, constipation, dizziness, abdominal pain, skin rash, fevers, chills, night sweats, weight loss, swollen lymph nodes, , chest pain, shortness of breath, mood changes. Positive muscle aches, joint swelling, body aches  Objective  Blood pressure 138/70,  pulse 75, height 5\' 8"  (1.727 m), weight 210 lb (95.3 kg), SpO2 97 %. Systems examined below as of 06/08/17   General: No apparent distress alert and oriented x3 mood and affect normal, dressed appropriately.  HEENT: Pupils equal, extraocular movements intact  Respiratory: Patient's speak in full sentences and does not appear short of breath  Cardiovascular: 2+ lower extremity edema, non tender, no erythema and she also has edema of the hands bilaterally Skin: Warm dry intact with no signs of infection or rash on extremities or on axial skeleton.  Abdomen: Soft nontender  Neuro: Cranial nerves II through XII are intact, neurovascularly intact in all  extremities with 2+ DTRs and 2+ pulses.  Lymph: No lymphadenopathy of posterior or anterior cervical chain or axillae bilaterally.  Gait Antalgic gait MSK:  Non tender with full range of motion and good stability and symmetric strength and tone of shoulders, elbows, wrist, hip, knee bilaterally. Arthritic changes of multiple joints significant tightness of the right leg and left legs especially in the hamstrings  Back exam shows the patient does have loss of range of motion in all planes by at least 5. Patient does have some discomfort to palpation in the paraspinal musculature. Negative straight leg test but significant tightness of the right hamstring compared to left. Ankle: Right  Swelling noted Loss of 5 in all planes 4-5 in all directions but fairly symmetric to the contralateral side Stable lateral and medial ligaments; squeeze test and kleiger test unremarkable; Talar dome nontender; No pain at base of 5th MT; No tenderness over cuboid; No tenderness over N spot or navicular prominence No tenderness on posterior aspects of lateral and medial malleolus No sign of peroneal tendon subluxations or tenderness to palpation Negative tarsal tunnel tinel's Patient does have tenderness over the Achilles at its insertion. No Haglund's nodule noted though. Able to walk 4 steps.   Impression and Recommendations:     This case required medical decision making of moderate complexity.      Note: This dictation was prepared with Dragon dictation along with smaller phrase technology. Any transcriptional errors that result from this process are unintentional.

## 2017-06-08 ENCOUNTER — Ambulatory Visit (INDEPENDENT_AMBULATORY_CARE_PROVIDER_SITE_OTHER)
Admission: RE | Admit: 2017-06-08 | Discharge: 2017-06-08 | Disposition: A | Discharge status: Home / Self Care | Payer: Medicare HMO | Source: Ambulatory Visit | Attending: Family Medicine | Admitting: Family Medicine

## 2017-06-08 ENCOUNTER — Ambulatory Visit (INDEPENDENT_AMBULATORY_CARE_PROVIDER_SITE_OTHER): Payer: Medicare HMO | Admitting: Family Medicine

## 2017-06-08 ENCOUNTER — Encounter: Payer: Self-pay | Admitting: Family Medicine

## 2017-06-08 VITALS — BP 138/70 | HR 75 | Ht 68.0 in | Wt 210.0 lb

## 2017-06-08 DIAGNOSIS — M5416 Radiculopathy, lumbar region: Secondary | ICD-10-CM

## 2017-06-08 DIAGNOSIS — M7661 Achilles tendinitis, right leg: Secondary | ICD-10-CM | POA: Diagnosis not present

## 2017-06-08 DIAGNOSIS — M5136 Other intervertebral disc degeneration, lumbar region: Secondary | ICD-10-CM | POA: Diagnosis not present

## 2017-06-08 DIAGNOSIS — M109 Gout, unspecified: Secondary | ICD-10-CM | POA: Diagnosis not present

## 2017-06-08 DIAGNOSIS — M7989 Other specified soft tissue disorders: Secondary | ICD-10-CM | POA: Diagnosis not present

## 2017-06-08 MED ORDER — AMLODIPINE BESYLATE 5 MG PO TABS: 5.0000 mg | ORAL_TABLET | Freq: Every day | ORAL | 1 refills | Status: DC

## 2017-06-08 MED ORDER — GABAPENTIN 100 MG PO CAPS: 200.0000 mg | ORAL_CAPSULE | Freq: Every day | ORAL | 3 refills | Status: DC

## 2017-06-08 NOTE — Patient Instructions (Addendum)
Good to see you  Get xray of back downstairs Start the allopurinol 300mg  daily  Gabapentin 200mg  at night Decrease the amlodipine to 5 mg daily to help with the swelling.  Ice is your friend. Ice ankle 10 minutes before bed.  Tart cherry extract any dose at night- you get this over the counter Exercises 3 times a week.  Wear shoes in the house to help the pain as well pennsaid pinkie amount topically 2 times daily as needed.   See me again in 3-4 weeks.

## 2017-06-08 NOTE — Assessment & Plan Note (Signed)
Patient does have what appears to be more of a right Achilles tendinitis. Home exercises given topical anti-inflammatories. We discussed which activities to do a which was to avoid. Patient will follow-up with me again in 4 weeks differential includes a lumbar radiculopathy and patient will have x-rays done today. Started on gabapentin to see if this will be beneficial.

## 2017-06-08 NOTE — Assessment & Plan Note (Signed)
Patient does have swelling noted of the hands and the ankles bilaterally. This could be a secondary side effect to patient's amlodipine. Decreased to 5 mg to see if this will be beneficial. Differential also includes the gouty arthritis. We will see how patient does.

## 2017-06-08 NOTE — Assessment & Plan Note (Signed)
Encouraged patient to take the allopurinol and a regular basis. Has not been doing so. Hopefully this will be beneficial.

## 2017-07-04 ENCOUNTER — Other Ambulatory Visit: Payer: Self-pay | Admitting: Internal Medicine

## 2017-07-06 ENCOUNTER — Ambulatory Visit: Payer: Medicare HMO | Admitting: Family Medicine

## 2017-08-09 ENCOUNTER — Other Ambulatory Visit (INDEPENDENT_AMBULATORY_CARE_PROVIDER_SITE_OTHER): Payer: Medicare HMO

## 2017-08-09 DIAGNOSIS — IMO0002 Reserved for concepts with insufficient information to code with codable children: Secondary | ICD-10-CM

## 2017-08-09 DIAGNOSIS — E1165 Type 2 diabetes mellitus with hyperglycemia: Secondary | ICD-10-CM

## 2017-08-09 DIAGNOSIS — E1159 Type 2 diabetes mellitus with other circulatory complications: Secondary | ICD-10-CM

## 2017-08-09 LAB — LIPID PANEL
Cholesterol: 125 mg/dL (ref 0–200)
HDL: 29.3 mg/dL — AB (ref >39.00)
NONHDL: 95.35
Total CHOL/HDL Ratio: 4
Triglycerides: 230 mg/dL — ABNORMAL HIGH (ref 0.0–149.0)
VLDL: 46 mg/dL — ABNORMAL HIGH (ref 0.0–40.0)

## 2017-08-09 LAB — BASIC METABOLIC PANEL
BUN: 10 mg/dL (ref 6–23)
CALCIUM: 9.7 mg/dL (ref 8.4–10.5)
CHLORIDE: 106 meq/L (ref 96–112)
CO2: 30 meq/L (ref 19–32)
Creatinine, Ser: 1 mg/dL (ref 0.40–1.50)
GFR: 93.52 mL/min (ref >60.00)
GLUCOSE: 144 mg/dL — AB (ref 70–99)
POTASSIUM: 3.6 meq/L (ref 3.5–5.1)
SODIUM: 142 meq/L (ref 135–145)

## 2017-08-09 LAB — HEPATIC FUNCTION PANEL
ALK PHOS: 55 U/L (ref 39–117)
ALT: 13 U/L (ref 0–53)
AST: 17 U/L (ref 0–37)
Albumin: 4 g/dL (ref 3.5–5.2)
BILIRUBIN DIRECT: 0 mg/dL (ref 0.0–0.3)
Total Bilirubin: 0.4 mg/dL (ref 0.2–1.2)
Total Protein: 6.9 g/dL (ref 6.0–8.3)

## 2017-08-09 LAB — HEMOGLOBIN A1C: HEMOGLOBIN A1C: 6.1 % (ref 4.6–6.5)

## 2017-08-09 LAB — LDL CHOLESTEROL, DIRECT: Direct LDL: 60 mg/dL

## 2017-08-11 ENCOUNTER — Ambulatory Visit (INDEPENDENT_AMBULATORY_CARE_PROVIDER_SITE_OTHER): Payer: Medicare HMO | Admitting: Internal Medicine

## 2017-08-11 ENCOUNTER — Ambulatory Visit: Payer: Medicare HMO | Admitting: Internal Medicine

## 2017-08-11 ENCOUNTER — Encounter: Payer: Self-pay | Admitting: Internal Medicine

## 2017-08-11 VITALS — BP 140/78 | HR 70 | Temp 97.6°F | Ht 68.0 in | Wt 212.0 lb

## 2017-08-11 DIAGNOSIS — E1165 Type 2 diabetes mellitus with hyperglycemia: Secondary | ICD-10-CM

## 2017-08-11 DIAGNOSIS — I10 Essential (primary) hypertension: Secondary | ICD-10-CM | POA: Diagnosis not present

## 2017-08-11 DIAGNOSIS — M109 Gout, unspecified: Secondary | ICD-10-CM | POA: Diagnosis not present

## 2017-08-11 DIAGNOSIS — Z0000 Encounter for general adult medical examination without abnormal findings: Secondary | ICD-10-CM | POA: Diagnosis not present

## 2017-08-11 DIAGNOSIS — Z23 Encounter for immunization: Secondary | ICD-10-CM

## 2017-08-11 DIAGNOSIS — E782 Mixed hyperlipidemia: Secondary | ICD-10-CM | POA: Diagnosis not present

## 2017-08-11 MED ORDER — CARVEDILOL 25 MG PO TABS: 25.0000 mg | ORAL_TABLET | Freq: Two times a day (BID) | ORAL | 3 refills | Status: DC

## 2017-08-11 MED ORDER — PREDNISONE 10 MG PO TABS: ORAL_TABLET | ORAL | 0 refills | Status: DC

## 2017-08-11 MED ORDER — AMLODIPINE BESYLATE 5 MG PO TABS: 5.0000 mg | ORAL_TABLET | Freq: Every day | ORAL | 3 refills | Status: DC

## 2017-08-11 NOTE — Assessment & Plan Note (Signed)
Improved but still mild bilat hand and wrists - for repeat prednisone asd

## 2017-08-11 NOTE — Progress Notes (Signed)
Subjective:    Patient ID: Travis Carey, male    DOB: 1941-09-24, 76 y.o.   MRN: [id number]  HPI  Here to f/u; overall doing ok,  Pt denies chest pain, increasing sob or doe, wheezing, orthopnea, PND, palpitations, dizziness or syncope.  Pt denies new neurological symptoms such as new headache, or facial or extremity weakness or numbness.  Pt denies polydipsia, polyuria, or low sugar episode.  Pt states overall good compliance with meds, mostly trying to follow appropriate diet, with wt overall stable,  but little exercise however. Swelling worse left > right legs on incresaed amlodipine. Also did well with prior steroid tx for bilat UE gouty arthritis, but now milder recurred in the past week. No fever or skin change Past Medical History:  Diagnosis Date  . Allergic rhinitis, cause unspecified 01/31/2013  . ANEMIA-NOS 01/28/2010  . BPH (benign prostatic hypertrophy) 04/25/2013  . CAD 02/17/2010  . CAROTID BRUIT 11/18/2009  . Carpal tunnel syndrome 05/20/2008  . Cervical radiculitis 04/17/2012  . CHEST PAIN 09/26/2009  . DEPRESSION 06/12/2007  . DYSPNEA 07/28/2010  . ERECTILE DYSFUNCTION 02/24/2009  . GERD 06/09/2007  . GERD (gastroesophageal reflux disease)   . GLUCOSE INTOLERANCE 01/28/2010  . Gout 2017   Dr.John  . HEMORRHOIDS 06/09/2007  . History of colonic polyps 08/07/2015  . HYPERLIPIDEMIA 02/07/2008  . HYPERTENSION 06/09/2007  . HYPOKALEMIA 01/28/2010  . Impaired glucose tolerance 01/23/2011  . LOW BACK PAIN 06/12/2007  . MYOCARDIAL PERFUSION SCAN, WITH STRESS TEST, ABNORMAL 10/08/2009  . PARESTHESIA 04/25/2008  . SHOULDER PAIN, LEFT 04/25/2008  . URI 08/19/2008  . VITAMIN B12 DEFICIENCY 02/09/2010   Past Surgical History:  Procedure Laterality Date  . COLONOSCOPY    . CORONARY STENT PLACEMENT     from right radial    reports that he has never smoked. He has never used smokeless tobacco. He reports that he does not drink alcohol or use drugs. family history includes Alcohol abuse in his  brother and sister; Coronary artery disease in his other; Heart attack in his other; Heart disease in his father and mother; Hyperlipidemia in his father and mother; Hypertension in his father, mother, and other; Stroke in his other. Allergies  Allergen Reactions  . Diclofenac Sodium     REACTION: maks pt drowsey   Current Outpatient Prescriptions on File Prior to Visit  Medication Sig Dispense Refill  . allopurinol (ZYLOPRIM) 300 MG tablet Take 1 tablet (300 mg total) by mouth daily. 90 tablet 3  . aspirin EC 81 MG tablet Take 81 mg by mouth daily.    . clopidogrel (PLAVIX) 75 MG tablet TAKE 1 TABLET EVERY DAY 90 tablet 0  . colchicine 0.6 MG tablet Take 1 tablet (0.6 mg total) by mouth daily. (Patient taking differently: Take 0.6 mg by mouth as needed. ) 90 tablet 3  . dexlansoprazole (DEXILANT) 60 MG capsule Take 1 capsule (60 mg total) by mouth daily. 90 capsule 3  . fexofenadine (ALLEGRA) 180 MG tablet Take 1 tablet (180 mg total) by mouth daily. As needed (Patient taking differently: Take 180 mg by mouth daily as needed for allergies. ) 90 tablet 3  . fluticasone (FLONASE) 50 MCG/ACT nasal spray USE TWO SPRAYS IN EACH NOSTRIL ONCE DAILY 48 g 5  . gabapentin (NEURONTIN) 100 MG capsule Take 2 capsules (200 mg total) by mouth at bedtime. 60 capsule 3  . HYDROcodone-acetaminophen (NORCO/VICODIN) 5-325 MG tablet Take 1 tablet by mouth every 6 (six) hours as needed for moderate  pain. 60 tablet 0  . lisinopril-hydrochlorothiazide (PRINZIDE,ZESTORETIC) 10-12.5 MG tablet Take 2 tablets by mouth daily. 180 tablet 2  . meloxicam (MOBIC) 7.5 MG tablet TAKE 1 TO 2 TABLETS ONE TIME DAILY AS NEEDED FOR PAIN 180 tablet 3  . omeprazole (PRILOSEC) 20 MG capsule Take 1 capsule (20 mg total) by mouth daily. 30 capsule 3  . potassium chloride (K-DUR) 10 MEQ tablet Take 2 tablets (20 mEq total) by mouth daily. 180 tablet 3  . rosuvastatin (CRESTOR) 10 MG tablet TAKE 1 TABLET EVERY DAY 90 tablet 3  . tamsulosin  (FLOMAX) 0.4 MG CAPS capsule TAKE ONE CAPSULE BY MOUTH ONCE DAILY 90 capsule 3  . traMADol (ULTRAM) 50 MG tablet Take 1 tablet (50 mg total) by mouth every 6 (six) hours as needed. 40 tablet 0   No current facility-administered medications on file prior to visit.    Review of Systems  Constitutional: Negative for other unusual diaphoresis or sweats HENT: Negative for ear discharge or swelling Eyes: Negative for other worsening visual disturbances Respiratory: Negative for stridor or other swelling  Gastrointestinal: Negative for worsening distension or other blood Genitourinary: Negative for retention or other urinary change Musculoskeletal: Negative for other MSK pain or swelling Skin: Negative for color change or other new lesions Neurological: Negative for worsening tremors and other numbness  Psychiatric/Behavioral: Negative for worsening agitation or other fatigue All other system neg per pt    Objective:   Physical Exam BP 140/78   Pulse 70   Temp 97.6 F (36.4 C) (Oral)   Ht 5\' 8"  (1.727 m)   Wt 212 lb (96.2 kg)   SpO2 99%   BMI 32.23 kg/m  VS noted,  Constitutional: Pt appears in NAD HENT: Head: NCAT.  Right Ear: External ear normal.  Left Ear: External ear normal.  Eyes: . Pupils are equal, round, and reactive to light. Conjunctivae and EOM are normal Nose: without d/c or deformity Neck: Neck supple. Gross normal ROM Cardiovascular: Normal rate and regular rhythm.   Pulmonary/Chest: Effort normal and breath sounds without rales or wheezing.  Abd:  Soft, NT, ND, + BS, no organomegaly Neurological: Pt is alert. At baseline orientation, motor grossly intact Skin: Skin is warm. No rashes, other new lesions, 1+ bilat LE edema, and bilat hands and wrists with mild tender, warm swelling Psychiatric: Pt behavior is normal without agitation  No other exam findings Lab Results  Component Value Date   WBC 5.9 01/11/2017   HGB 13.0 01/11/2017   HCT 37.3 (L) 01/11/2017    PLT 217.0 01/11/2017   GLUCOSE 144 (H) 08/09/2017   CHOL 125 08/09/2017   TRIG 230.0 (H) 08/09/2017   HDL 29.30 (L) 08/09/2017   LDLDIRECT 60.0 08/09/2017   LDLCALC 64 04/08/2015   ALT 13 08/09/2017   AST 17 08/09/2017   NA 142 08/09/2017   K 3.6 08/09/2017   CL 106 08/09/2017   CREATININE 1.00 08/09/2017   BUN 10 08/09/2017   CO2 30 08/09/2017   TSH 1.98 01/11/2017   PSA 0.56 01/11/2017   INR 1.0 ratio 10/23/2009   HGBA1C 6.1 08/09/2017   MICROALBUR 0.9 01/11/2017       Assessment & Plan:

## 2017-08-11 NOTE — Patient Instructions (Addendum)
Ok to stop the amlodipine 10 mg per day due to the worsening leg swelling  Please take all new medication as prescribed - the lower dose amlodipine 5 mg per day  OK to increase the Coreg (carvedilol) to 25 mg twice per day  Please take all new medication as prescribed - the prednisone  Please continue all other medications as before, and refills have been done if requested.  Please have the pharmacy call with any other refills you may need.  Please continue your efforts at being more active, low cholesterol diet, and weight control.  Please keep your appointments with your specialists as you may have planned  Please return in 6 months, or sooner if needed, with Lab testing done 3-5 days before

## 2017-08-13 NOTE — Assessment & Plan Note (Signed)
stable overall by history and exam, recent data reviewed with pt, and pt to continue medical treatment as before,  to f/u any worsening symptoms or concerns Lab Results  Component Value Date   LDLCALC 64 04/08/2015

## 2017-08-13 NOTE — Assessment & Plan Note (Signed)
stable overall by history and exam, recent data reviewed with pt, and pt to continue medical treatment as before,  to f/u any worsening symptoms or concerns Lab Results  Component Value Date   HGBA1C 6.1 08/09/2017  pt to call for cbg > 200 on steroid tx

## 2017-08-13 NOTE — Assessment & Plan Note (Addendum)
Has bilat LE edema likely amlodipine related, ok to decrease the amlodipine 5 qd and increase coreg 25 bid, o/w stable overall by history and exam, recent data reviewed with pt, and pt to continue medical treatment as before,  to f/u any worsening symptoms or concerns BP Readings from Last 3 Encounters:  08/11/17 140/78  06/08/17 138/70  05/03/17 140/78

## 2017-08-16 ENCOUNTER — Telehealth: Payer: Self-pay | Admitting: Internal Medicine

## 2017-08-16 NOTE — Telephone Encounter (Signed)
Pt said that he had a missed call. It does not look like anyone called him but he recently had labs done and would like to know the results.

## 2017-08-16 NOTE — Telephone Encounter (Signed)
Dr. Jenny Reichmann I haven't reached out to the patient but can you advise on the lab results.

## 2017-08-16 NOTE — Telephone Encounter (Signed)
All labs were ok - including the sugar AND cholesterol  No need for change in tx

## 2017-08-17 NOTE — Telephone Encounter (Signed)
Pt has been informed and expressed understanding.  

## 2017-08-25 ENCOUNTER — Other Ambulatory Visit: Payer: Self-pay | Admitting: Internal Medicine

## 2017-08-28 ENCOUNTER — Other Ambulatory Visit: Payer: Self-pay | Admitting: Internal Medicine

## 2017-09-05 ENCOUNTER — Other Ambulatory Visit: Payer: Self-pay

## 2017-09-05 MED ORDER — POTASSIUM CHLORIDE ER 10 MEQ PO TBCR: 20.0000 meq | EXTENDED_RELEASE_TABLET | Freq: Every day | ORAL | 3 refills | Status: DC

## 2017-09-05 MED ORDER — ALLOPURINOL 300 MG PO TABS: 300.0000 mg | ORAL_TABLET | Freq: Every day | ORAL | 3 refills | Status: DC

## 2017-09-05 MED ORDER — LISINOPRIL-HYDROCHLOROTHIAZIDE 10-12.5 MG PO TABS: 2.0000 | ORAL_TABLET | Freq: Every day | ORAL | 3 refills | Status: DC

## 2017-09-05 MED ORDER — GABAPENTIN 100 MG PO CAPS: 200.0000 mg | ORAL_CAPSULE | Freq: Every day | ORAL | 3 refills | Status: DC

## 2017-11-14 ENCOUNTER — Ambulatory Visit (INDEPENDENT_AMBULATORY_CARE_PROVIDER_SITE_OTHER): Payer: Medicare HMO | Admitting: Internal Medicine

## 2017-11-14 ENCOUNTER — Encounter: Payer: Self-pay | Admitting: Internal Medicine

## 2017-11-14 ENCOUNTER — Other Ambulatory Visit (INDEPENDENT_AMBULATORY_CARE_PROVIDER_SITE_OTHER): Payer: Medicare HMO

## 2017-11-14 VITALS — BP 138/84 | HR 68 | Temp 98.0°F | Ht 68.0 in | Wt 208.0 lb

## 2017-11-14 DIAGNOSIS — E1165 Type 2 diabetes mellitus with hyperglycemia: Secondary | ICD-10-CM

## 2017-11-14 DIAGNOSIS — Z0001 Encounter for general adult medical examination with abnormal findings: Secondary | ICD-10-CM

## 2017-11-14 DIAGNOSIS — M109 Gout, unspecified: Secondary | ICD-10-CM

## 2017-11-14 DIAGNOSIS — M7661 Achilles tendinitis, right leg: Secondary | ICD-10-CM | POA: Diagnosis not present

## 2017-11-14 DIAGNOSIS — I10 Essential (primary) hypertension: Secondary | ICD-10-CM

## 2017-11-14 DIAGNOSIS — K219 Gastro-esophageal reflux disease without esophagitis: Secondary | ICD-10-CM | POA: Diagnosis not present

## 2017-11-14 LAB — CBC WITH DIFFERENTIAL/PLATELET
BASOS PCT: 0.4 % (ref 0.0–3.0)
Basophils Absolute: 0 10*3/uL (ref 0.0–0.1)
EOS ABS: 0.1 10*3/uL (ref 0.0–0.7)
EOS PCT: 1.8 % (ref 0.0–5.0)
HCT: 37.9 % — ABNORMAL LOW (ref 39.0–52.0)
HEMOGLOBIN: 12.7 g/dL — AB (ref 13.0–17.0)
Lymphocytes Relative: 47.1 % — ABNORMAL HIGH (ref 12.0–46.0)
Lymphs Abs: 2.8 10*3/uL (ref 0.7–4.0)
MCHC: 33.7 g/dL (ref 30.0–36.0)
MCV: 89.3 fl (ref 78.0–100.0)
Monocytes Absolute: 0.6 10*3/uL (ref 0.1–1.0)
Monocytes Relative: 9.5 % (ref 3.0–12.0)
Neutro Abs: 2.5 10*3/uL (ref 1.4–7.7)
Neutrophils Relative %: 41.2 % — ABNORMAL LOW (ref 43.0–77.0)
Platelets: 218 10*3/uL (ref 150.0–400.0)
RBC: 4.24 Mil/uL (ref 4.22–5.81)
RDW: 14.4 % (ref 11.5–15.5)
WBC: 6 10*3/uL (ref 4.0–10.5)

## 2017-11-14 LAB — URINALYSIS, ROUTINE W REFLEX MICROSCOPIC
Bilirubin Urine: NEGATIVE
HGB URINE DIPSTICK: NEGATIVE
Nitrite: NEGATIVE
Specific Gravity, Urine: 1.02 (ref 1.000–1.030)
TOTAL PROTEIN, URINE-UPE24: NEGATIVE
Urine Glucose: NEGATIVE
Urobilinogen, UA: 0.2 (ref 0.0–1.0)
pH: 6 (ref 5.0–8.0)

## 2017-11-14 LAB — HEPATIC FUNCTION PANEL
ALT: 13 U/L (ref 0–53)
AST: 17 U/L (ref 0–37)
Albumin: 4 g/dL (ref 3.5–5.2)
Alkaline Phosphatase: 53 U/L (ref 39–117)
BILIRUBIN DIRECT: 0.1 mg/dL (ref 0.0–0.3)
TOTAL PROTEIN: 6.8 g/dL (ref 6.0–8.3)
Total Bilirubin: 0.4 mg/dL (ref 0.2–1.2)

## 2017-11-14 LAB — MICROALBUMIN / CREATININE URINE RATIO
CREATININE, U: 227.1 mg/dL
MICROALB UR: 1.2 mg/dL (ref 0.0–1.9)
Microalb Creat Ratio: 0.5 mg/g (ref 0.0–30.0)

## 2017-11-14 LAB — BASIC METABOLIC PANEL
BUN: 17 mg/dL (ref 6–23)
CHLORIDE: 103 meq/L (ref 96–112)
CO2: 35 meq/L — AB (ref 19–32)
Calcium: 10 mg/dL (ref 8.4–10.5)
Creatinine, Ser: 1.11 mg/dL (ref 0.40–1.50)
GFR: 82.85 mL/min (ref >60.00)
Glucose, Bld: 88 mg/dL (ref 70–99)
Potassium: 3.6 mEq/L (ref 3.5–5.1)
SODIUM: 144 meq/L (ref 135–145)

## 2017-11-14 LAB — LDL CHOLESTEROL, DIRECT: LDL DIRECT: 63 mg/dL

## 2017-11-14 LAB — LIPID PANEL
CHOL/HDL RATIO: 4
CHOLESTEROL: 144 mg/dL (ref 0–200)
HDL: 36.5 mg/dL — ABNORMAL LOW (ref >39.00)
NonHDL: 107.4
TRIGLYCERIDES: 325 mg/dL — AB (ref 0.0–149.0)
VLDL: 65 mg/dL — AB (ref 0.0–40.0)

## 2017-11-14 LAB — HEMOGLOBIN A1C: Hgb A1c MFr Bld: 6.3 % (ref 4.6–6.5)

## 2017-11-14 LAB — TSH: TSH: 2.69 u[IU]/mL (ref 0.35–4.50)

## 2017-11-14 LAB — PSA: PSA: 0.54 ng/mL (ref 0.10–4.00)

## 2017-11-14 MED ORDER — LISINOPRIL-HYDROCHLOROTHIAZIDE 20-12.5 MG PO TABS: 1.0000 | ORAL_TABLET | Freq: Every day | ORAL | 3 refills | Status: DC

## 2017-11-14 MED ORDER — MELOXICAM 7.5 MG PO TABS: ORAL_TABLET | ORAL | 3 refills | Status: DC

## 2017-11-14 MED ORDER — METHYLPREDNISOLONE ACETATE 80 MG/ML IJ SUSP
80.0000 mg | Freq: Once | INTRAMUSCULAR | Status: AC
  Administered 2017-11-14: 80 mg via INTRAMUSCULAR

## 2017-11-14 MED ORDER — PANTOPRAZOLE SODIUM 40 MG PO TBEC: 40.0000 mg | DELAYED_RELEASE_TABLET | Freq: Every day | ORAL | 3 refills | Status: DC

## 2017-11-14 MED ORDER — PREDNISONE 10 MG PO TABS: ORAL_TABLET | ORAL | 0 refills | Status: DC

## 2017-11-14 NOTE — Progress Notes (Signed)
Subjective:    Patient ID: Travis Carey, male    DOB: Jan 27, 1941, 77 y.o.   MRN: [id number]  HPI  Here for wellness and f/u;  Overall doing ok;  Pt denies neurological change such as new headache, facial or extremity weakness.  Pt denies polydipsia, polyuria, or low sugar symptoms. Pt states overall good compliance with treatment and medications, good tolerability, and has been trying to follow appropriate diet.  Pt denies worsening depressive symptoms, suicidal ideation or panic. No fever, night sweats, wt loss, loss of appetite, or other constitutional symptoms.  Pt states good ability with ADL's, has low fall risk, home safety reviewed and adequate, no other significant changes in hearing or vision, and only occasionally active with exercise. Want to stop allopurinol as seeems to make his pain worse.  Due for optho and colonoscopy, and plans to call for optho appt himself.  Pt denies chest pain, increased sob or doe, wheezing, orthopnea, PND, increased LE swelling, palpitations, dizziness or syncope except for occasional mild sob with eating per pt  Has f/u due with cardiology April 2019.  Leg swelling has improved since amlodipine reduced from 10 to 5 Also c/o worsening bilat hand pain and swelling c/w gout, stopped his allopurinol 300 mg as he was convinced it made the pain worse.  Has improved with steroid tx in past. Also has a type of mild to mod distal post right leg pain, sharp, only worse to first few steps to walk, then better after that with more walking  No trauma.  Has been standing and walking more recently Also has mild worsening reflux pain, prilosec generic 20 mg per cardiology did not work well enough so only taking alka seltzer prnb, and no abd pain, dysphagia, n/v, bowel change or blood. Past Medical History:  Diagnosis Date  . Allergic rhinitis, cause unspecified 01/31/2013  . ANEMIA-NOS 01/28/2010  . BPH (benign prostatic hypertrophy) 04/25/2013  . CAD 02/17/2010  . CAROTID  BRUIT 11/18/2009  . Carpal tunnel syndrome 05/20/2008  . Cervical radiculitis 04/17/2012  . CHEST PAIN 09/26/2009  . DEPRESSION 06/12/2007  . DYSPNEA 07/28/2010  . ERECTILE DYSFUNCTION 02/24/2009  . GERD 06/09/2007  . GERD (gastroesophageal reflux disease)   . GLUCOSE INTOLERANCE 01/28/2010  . Gout 2017   Dr.Dmani Mizer  . HEMORRHOIDS 06/09/2007  . History of colonic polyps 08/07/2015  . HYPERLIPIDEMIA 02/07/2008  . HYPERTENSION 06/09/2007  . HYPOKALEMIA 01/28/2010  . Impaired glucose tolerance 01/23/2011  . LOW BACK PAIN 06/12/2007  . MYOCARDIAL PERFUSION SCAN, WITH STRESS TEST, ABNORMAL 10/08/2009  . PARESTHESIA 04/25/2008  . SHOULDER PAIN, LEFT 04/25/2008  . URI 08/19/2008  . VITAMIN B12 DEFICIENCY 02/09/2010   Past Surgical History:  Procedure Laterality Date  . COLONOSCOPY    . CORONARY STENT PLACEMENT     from right radial    reports that  has never smoked. he has never used smokeless tobacco. He reports that he does not drink alcohol or use drugs. family history includes Alcohol abuse in his brother and sister; Coronary artery disease in his other; Heart attack in his other; Heart disease in his father and mother; Hyperlipidemia in his father and mother; Hypertension in his father, mother, and other; Stroke in his other. Allergies  Allergen Reactions  . Diclofenac Sodium     REACTION: maks pt drowsey   Current Outpatient Medications on File Prior to Visit  Medication Sig Dispense Refill  . amLODipine (NORVASC) 5 MG tablet Take 1 tablet (5 mg total) by mouth  daily. 90 tablet 3  . aspirin EC 81 MG tablet Take 81 mg by mouth daily.    . carvedilol (COREG) 25 MG tablet Take 1 tablet (25 mg total) by mouth 2 (two) times daily. 180 tablet 3  . clopidogrel (PLAVIX) 75 MG tablet TAKE 1 TABLET EVERY DAY 90 tablet 0  . colchicine 0.6 MG tablet Take 1 tablet (0.6 mg total) by mouth daily. (Patient taking differently: Take 0.6 mg by mouth as needed. ) 90 tablet 3  . dexlansoprazole (DEXILANT) 60 MG  capsule Take 1 capsule (60 mg total) by mouth daily. 90 capsule 3  . fexofenadine (ALLEGRA) 180 MG tablet Take 1 tablet (180 mg total) by mouth daily. As needed (Patient taking differently: Take 180 mg by mouth daily as needed for allergies. ) 90 tablet 3  . fluticasone (FLONASE) 50 MCG/ACT nasal spray USE TWO SPRAYS IN EACH NOSTRIL ONCE DAILY 48 g 5  . gabapentin (NEURONTIN) 100 MG capsule Take 2 capsules (200 mg total) by mouth at bedtime. 60 capsule 3  . HYDROcodone-acetaminophen (NORCO/VICODIN) 5-325 MG tablet Take 1 tablet by mouth every 6 (six) hours as needed for moderate pain. 60 tablet 0  . lisinopril-hydrochlorothiazide (PRINZIDE,ZESTORETIC) 10-12.5 MG tablet Take 2 tablets by mouth daily. 180 tablet 3  . potassium chloride (K-DUR) 10 MEQ tablet Take 2 tablets (20 mEq total) by mouth daily. 180 tablet 3  . predniSONE (DELTASONE) 10 MG tablet 3 tabs by mouth per day for 3 days,2tabs per day for 3 days,1tab per day for 3 days 18 tablet 0  . rosuvastatin (CRESTOR) 10 MG tablet TAKE 1 TABLET EVERY DAY 90 tablet 3  . tamsulosin (FLOMAX) 0.4 MG CAPS capsule TAKE ONE CAPSULE BY MOUTH ONCE DAILY 90 capsule 3   No current facility-administered medications on file prior to visit.    Review of Systems Constitutional: Negative for other unusual diaphoresis, sweats, appetite or weight changes HENT: Negative for other worsening hearing loss, ear pain, facial swelling, mouth sores or neck stiffness.   Eyes: Negative for other worsening pain, redness or other visual disturbance.  Respiratory: Negative for other stridor or swelling Cardiovascular: Negative for other palpitations or other chest pain  Gastrointestinal: Negative for worsening diarrhea or loose stools, blood in stool, distention or other pain Genitourinary: Negative for hematuria, flank pain or other change in urine volume.  Musculoskeletal: Negative for myalgias or other joint swelling.  Skin: Negative for other color change, or other  wound or worsening drainage.  Neurological: Negative for other syncope or numbness. Hematological: Negative for other adenopathy or swelling Psychiatric/Behavioral: Negative for hallucinations, other worsening agitation, SI, self-injury, or new decreased concentration All other system neg per pt    Objective:   Physical Exam BP 138/84   Pulse 68   Temp 98 F (36.7 C) (Oral)   Ht 5\' 8"  (1.727 m)   Wt 208 lb (94.3 kg)   SpO2 97%   BMI 31.63 kg/m  VS noted,  Constitutional: Pt is oriented to person, place, and time. Appears well-developed and well-nourished, in no significant distress and comfortable Head: Normocephalic and atraumatic  Eyes: Conjunctivae and EOM are normal. Pupils are equal, round, and reactive to light Right Ear: External ear normal without discharge Left Ear: External ear normal without discharge Nose: Nose without discharge or deformity Mouth/Throat: Oropharynx is without other ulcerations and moist  Neck: Normal range of motion. Neck supple. No JVD present. No tracheal deviation present or significant neck LA or mass Cardiovascular: Normal rate, regular  rhythm, normal heart sounds and intact distal pulses.   Pulmonary/Chest: WOB normal and breath sounds without rales or wheezing  Abdominal: Soft. Bowel sounds are normal. NT except mild epigastric tender,. No HSM  Musculoskeletal: Normal range of motion. Exhibits no edema except chronic trace bilat leg edema, also with bilat hand swelling/tender right > left mostly MCPs; also mild tender right achilles insertion site  Lymphadenopathy: Has no other cervical adenopathy.  Neurological: Pt is alert and oriented to person, place, and time. Pt has normal reflexes. No cranial nerve deficit. Motor grossly intact, Gait intact Skin: Skin is warm and dry. No rash noted or new ulcerations Psychiatric:  Has normal mood and affect. Behavior is normal without agitation No other exam  findings  Lab Results  Component Value Date     WBC 6.0 11/14/2017   HGB 12.7 (L) 11/14/2017   HCT 37.9 (L) 11/14/2017   PLT 218.0 11/14/2017   GLUCOSE 88 11/14/2017   CHOL 144 11/14/2017   TRIG 325.0 (H) 11/14/2017   HDL 36.50 (L) 11/14/2017   LDLDIRECT 63.0 11/14/2017   LDLCALC 64 04/08/2015   ALT 13 11/14/2017   AST 17 11/14/2017   NA 144 11/14/2017   K 3.6 11/14/2017   CL 103 11/14/2017   CREATININE 1.11 11/14/2017   BUN 17 11/14/2017   CO2 35 (H) 11/14/2017   TSH 2.69 11/14/2017   PSA 0.54 11/14/2017   INR 1.0 ratio 10/23/2009   HGBA1C 6.3 11/14/2017   MICROALBUR 1.2 11/14/2017      Assessment & Plan:

## 2017-11-14 NOTE — Assessment & Plan Note (Signed)
Mild for nsaid prn,  to f/u any worsening symptoms or concerns

## 2017-11-14 NOTE — Assessment & Plan Note (Signed)

## 2017-11-14 NOTE — Assessment & Plan Note (Addendum)
Mild to mod, for predpac asd, depomedrol IM 80,  to f/u any worsening symptoms or concerns  In addition to the time spent performing CPE, I spent an additional 25 minutes face to face,in which greater than 50% of this time was spent in counseling and coordination of care for patient's acute illness as documented, including the differential dx, treatment, further evaluation and other management of acute gouty arthritis, achilles tendonitis, uncontrolled GERD, uncontrolled HTN, and DM

## 2017-11-14 NOTE — Assessment & Plan Note (Addendum)
Mild uncontrolled, o/w stable overall by history and exam, recent data reviewed with pt, and pt to increase BP med to lisinopril HCT 20/12.5,,  to f/u any worsening symptoms or concerns BP Readings from Last 3 Encounters:  11/14/17 138/84  08/11/17 140/78  06/08/17 138/70

## 2017-11-14 NOTE — Assessment & Plan Note (Signed)
For protonix 40 qd,  to f/u any worsening symptoms or concerns 

## 2017-11-14 NOTE — Patient Instructions (Addendum)
OK to stay off the allopurinol for now, and stop the Lisinopril HCT 10-12.5 mg per day  Please take all new medication as prescribed - the increased Lisinopril HCT 20-12.5 mg per day  You had the steroid shot today  Please take all new medication as prescribed - the prednisone  You will be contacted regarding the referral for: colonoscopy  Please take all new medication as prescribed - the protonix 40 mg daily   Please continue all other medications as before, and refills have been done if requested, including the Mobic (antiinflammatory) that can help with gout and the right sided Achilles tendonitis  Please have the pharmacy call with any other refills you may need.  Please continue your efforts at being more active, low cholesterol diet, and weight control.  You are otherwise up to date with prevention measures today.  Please keep your appointments with your specialists as you may have planned  Please go to the LAB in the Basement (turn left off the elevator) for the tests to be done today  You will be contacted by phone if any changes need to be made immediately.  Otherwise, you will receive a letter about your results with an explanation, but please check with MyChart first.  Please remember to sign up for MyChart if you have not done so, as this will be important to you in the future with finding out test results, communicating by private email, and scheduling acute appointments online when needed.  Please return in 6 months, or sooner if needed, with Lab testing done 3-5 days before  OK to cancel the April 2019 appointment (this will be done for your)

## 2017-11-14 NOTE — Assessment & Plan Note (Signed)
stable overall by history and exam, recent data reviewed with pt, and pt to continue medical treatment as before,  to f/u any worsening symptoms or concerns Lab Results  Component Value Date   HGBA1C 6.3 11/14/2017

## 2017-12-16 ENCOUNTER — Telehealth: Payer: Self-pay | Admitting: Internal Medicine

## 2017-12-16 MED ORDER — ROSUVASTATIN CALCIUM 10 MG PO TABS: 10.0000 mg | ORAL_TABLET | Freq: Every day | ORAL | 1 refills | Status: DC

## 2017-12-16 NOTE — Telephone Encounter (Signed)
Copied from [place]. Topic: Quick Communication - Rx Refill/Question >> Dec 16, 2017 12:25 PM Boyd Kerbs wrote:  Medication: rosuvastatin (CRESTOR) 10 MG tablet  Has the patient contacted their pharmacy? No.  Switching to [employer] instead of [employer].  He says he has one pill left  (Agent: If no, request that the patient contact the pharmacy for the refill.)   Preferred Pharmacy (with phone number or street name): [facility] ([address]), [place] [facility] [place] ([place])  [id number] Phone: [phone] Fax: [phone]     Agent: Please be advised that RX refills may take up to 3 business days. We ask that you follow-up with your pharmacy.

## 2017-12-17 ENCOUNTER — Other Ambulatory Visit: Payer: Self-pay | Admitting: Internal Medicine

## 2017-12-28 ENCOUNTER — Encounter: Payer: Self-pay | Admitting: Internal Medicine

## 2017-12-28 ENCOUNTER — Telehealth: Payer: Self-pay | Admitting: Internal Medicine

## 2017-12-28 NOTE — Telephone Encounter (Signed)
Patient states he is doing a modification on his mortgage.  States he needs a letter in regard to his health to get qualified.  Patient states letter needs to state that he has had a stroke and heart issues therefore he is not able to work.  Which would qualify him for reduced mortgage.  Please let me know if patient needs appointment first.  I will call to set up.  Otherwise patient can be reached at number noted once ready.   Patient also states his handicap parking sticker on his windshield is getting ready to expire.  Patient would like to know if we have blank forms for renewal that can be completed?  Otherwise patient can get renewal form to bring back up to the office.

## 2017-12-29 NOTE — Telephone Encounter (Signed)
Both done hardcopy to shirron

## 2017-12-29 NOTE — Telephone Encounter (Signed)
Forms given to [facility]

## 2018-01-15 ENCOUNTER — Other Ambulatory Visit: Payer: Self-pay | Admitting: Internal Medicine

## 2018-02-02 ENCOUNTER — Ambulatory Visit (INDEPENDENT_AMBULATORY_CARE_PROVIDER_SITE_OTHER): Payer: Medicare HMO | Admitting: Internal Medicine

## 2018-02-02 ENCOUNTER — Encounter: Payer: Self-pay | Admitting: Internal Medicine

## 2018-02-02 VITALS — BP 122/76 | HR 75 | Temp 98.4°F | Ht 68.0 in | Wt 201.0 lb

## 2018-02-02 DIAGNOSIS — J438 Other emphysema: Secondary | ICD-10-CM | POA: Diagnosis not present

## 2018-02-02 DIAGNOSIS — M5416 Radiculopathy, lumbar region: Secondary | ICD-10-CM | POA: Insufficient documentation

## 2018-02-02 DIAGNOSIS — I10 Essential (primary) hypertension: Secondary | ICD-10-CM

## 2018-02-02 MED ORDER — GABAPENTIN 100 MG PO CAPS: 200.0000 mg | ORAL_CAPSULE | Freq: Three times a day (TID) | ORAL | 5 refills | Status: DC

## 2018-02-02 MED ORDER — HYDROCODONE-ACETAMINOPHEN 5-325 MG PO TABS: 1.0000 | ORAL_TABLET | Freq: Four times a day (QID) | ORAL | 0 refills | Status: DC | PRN

## 2018-02-02 NOTE — Patient Instructions (Signed)
Ok to increase the gabapentin to 200 mg (2 of the 100  Mg pills) to three times per day  Please continue all other medications as before, including the hydrocodone  If you need more pain medication after one wk, please call for refill  Please have the pharmacy call with any other refills you may need.  Please keep your appointments with your specialists as you may have planned  You will be contacted regarding the referral for: MRI for the lower back, and Neurosurgury

## 2018-02-02 NOTE — Assessment & Plan Note (Signed)
stable overall by history and exam, and pt to continue medical treatment as before,  to f/u any worsening symptoms or concerns 

## 2018-02-02 NOTE — Assessment & Plan Note (Signed)
stable overall by history and exam, recent data reviewed with pt, and pt to continue medical treatment as before,  to f/u any worsening symptoms or concerns BP Readings from Last 3 Encounters:  02/02/18 122/76  11/14/17 138/84  08/11/17 140/78

## 2018-02-02 NOTE — Assessment & Plan Note (Signed)
Acute onset, mod to severe pain with neuro change; for increased gabapentin to 200 tid, hydrocodone 5 prn, MRI LS spine and NS referral

## 2018-02-02 NOTE — Progress Notes (Signed)
Subjective:    Patient ID: Travis Carey, male    DOB: May 06, 1941, 77 y.o.   MRN: [id number]   HPI  Here to f/u with acute onset right lower back and leg pain after an episode of putting force to the right leg to stand up after a short time of kneeling to the right knee, has pain, numbness and weakness of the RLQ, usually 7/10 during day, 10/10 and worse to lie down at night, worse to ambulate, bend or twist.  No further injury, trauma or falls.  No prior hx of this, though he has known cervical disc dz for which he had deferred surgury in past.  Pt denies chest pain, increased sob or doe, wheezing, orthopnea, PND, increased LE swelling, palpitations, dizziness or syncope.   Pt denies polydipsia, polyuria Past Medical History:  Diagnosis Date  . Allergic rhinitis, cause unspecified 01/31/2013  . ANEMIA-NOS 01/28/2010  . BPH (benign prostatic hypertrophy) 04/25/2013  . CAD 02/17/2010  . CAROTID BRUIT 11/18/2009  . Carpal tunnel syndrome 05/20/2008  . Cervical radiculitis 04/17/2012  . CHEST PAIN 09/26/2009  . DEPRESSION 06/12/2007  . DYSPNEA 07/28/2010  . ERECTILE DYSFUNCTION 02/24/2009  . GERD 06/09/2007  . GERD (gastroesophageal reflux disease)   . GLUCOSE INTOLERANCE 01/28/2010  . Gout 2017   Dr.Wendie Diskin  . HEMORRHOIDS 06/09/2007  . History of colonic polyps 08/07/2015  . HYPERLIPIDEMIA 02/07/2008  . HYPERTENSION 06/09/2007  . HYPOKALEMIA 01/28/2010  . Impaired glucose tolerance 01/23/2011  . LOW BACK PAIN 06/12/2007  . MYOCARDIAL PERFUSION SCAN, WITH STRESS TEST, ABNORMAL 10/08/2009  . PARESTHESIA 04/25/2008  . SHOULDER PAIN, LEFT 04/25/2008  . URI 08/19/2008  . VITAMIN B12 DEFICIENCY 02/09/2010   Past Surgical History:  Procedure Laterality Date  . COLONOSCOPY    . CORONARY STENT PLACEMENT     from right radial    reports that he has never smoked. He has never used smokeless tobacco. He reports that he does not drink alcohol or use drugs. family history includes Alcohol abuse in his brother and  sister; Coronary artery disease in his other; Heart attack in his other; Heart disease in his father and mother; Hyperlipidemia in his father and mother; Hypertension in his father, mother, and other; Stroke in his other. Allergies  Allergen Reactions  . Diclofenac Sodium     REACTION: maks pt drowsey   Current Outpatient Medications on File Prior to Visit  Medication Sig Dispense Refill  . amLODipine (NORVASC) 5 MG tablet Take 1 tablet (5 mg total) by mouth daily. 90 tablet 3  . aspirin EC 81 MG tablet Take 81 mg by mouth daily.    . carvedilol (COREG) 25 MG tablet Take 1 tablet (25 mg total) by mouth 2 (two) times daily. 180 tablet 3  . clopidogrel (PLAVIX) 75 MG tablet TAKE 1 TABLET BY MOUTH ONCE DAILY 30 tablet 0  . colchicine 0.6 MG tablet Take 1 tablet (0.6 mg total) by mouth daily. (Patient taking differently: Take 0.6 mg by mouth as needed. ) 90 tablet 3  . dexlansoprazole (DEXILANT) 60 MG capsule Take 1 capsule (60 mg total) by mouth daily. 90 capsule 3  . fexofenadine (ALLEGRA) 180 MG tablet Take 1 tablet (180 mg total) by mouth daily. As needed (Patient taking differently: Take 180 mg by mouth daily as needed for allergies. ) 90 tablet 3  . fluticasone (FLONASE) 50 MCG/ACT nasal spray USE TWO SPRAYS IN EACH NOSTRIL ONCE DAILY 48 g 5  . lisinopril-hydrochlorothiazide (ZESTORETIC) 20-12.5 MG  tablet Take 1 tablet by mouth daily. 90 tablet 3  . meloxicam (MOBIC) 7.5 MG tablet TAKE 1 TO 2 TABLETS ONE TIME DAILY AS NEEDED FOR PAIN 180 tablet 3  . pantoprazole (PROTONIX) 40 MG tablet Take 1 tablet (40 mg total) by mouth daily. 90 tablet 3  . potassium chloride (K-DUR) 10 MEQ tablet Take 2 tablets (20 mEq total) by mouth daily. 180 tablet 3  . rosuvastatin (CRESTOR) 10 MG tablet Take 1 tablet (10 mg total) by mouth daily. 90 tablet 1  . tamsulosin (FLOMAX) 0.4 MG CAPS capsule TAKE ONE CAPSULE BY MOUTH ONCE DAILY 90 capsule 3   No current facility-administered medications on file prior to  visit.    Review of Systems  Constitutional: Negative for other unusual diaphoresis or sweats HENT: Negative for ear discharge or swelling Eyes: Negative for other worsening visual disturbances Respiratory: Negative for stridor or other swelling  Gastrointestinal: Negative for worsening distension or other blood Genitourinary: Negative for retention or other urinary change Musculoskeletal: Negative for other MSK pain or swelling Skin: Negative for color change or other new lesions Neurological: Negative for worsening tremors and other numbness  Psychiatric/Behavioral: Negative for worsening agitation or other fatigue All other system neg per pt    Objective:   Physical Exam BP 122/76   Pulse 75   Temp 98.4 F (36.9 C) (Oral)   Ht 5\' 8"  (1.727 m)   Wt 201 lb (91.2 kg)   SpO2 96%   BMI 30.56 kg/m  VS noted,  Constitutional: Pt appears in NAD HENT: Head: NCAT.  Right Ear: External ear normal.  Left Ear: External ear normal.  Eyes: . Pupils are equal, round, and reactive to light. Conjunctivae and EOM are normal Nose: without d/c or deformity Neck: Neck supple. Gross normal ROM Cardiovascular: Normal rate and regular rhythm.   Pulmonary/Chest: Effort normal and breath sounds without rales or wheezing.  Abd:  Soft, NT, ND, + BS, no organomegaly Neurological: Pt is alert. At baseline orientation, motor 5/5 except 4-4+/5 RLE weakness, as well as reduced Sensation to RLE below the knee to LT Skin: Skin is warm. No rashes, other new lesions, no LE edema Psychiatric: Pt behavior is normal without agitation  No other exam findings  Lab Results  Component Value Date   WBC 6.0 11/14/2017   HGB 12.7 (L) 11/14/2017   HCT 37.9 (L) 11/14/2017   PLT 218.0 11/14/2017   GLUCOSE 88 11/14/2017   CHOL 144 11/14/2017   TRIG 325.0 (H) 11/14/2017   HDL 36.50 (L) 11/14/2017   LDLDIRECT 63.0 11/14/2017   LDLCALC 64 04/08/2015   ALT 13 11/14/2017   AST 17 11/14/2017   NA 144 11/14/2017    K 3.6 11/14/2017   CL 103 11/14/2017   CREATININE 1.11 11/14/2017   BUN 17 11/14/2017   CO2 35 (H) 11/14/2017   TSH 2.69 11/14/2017   PSA 0.54 11/14/2017   INR 1.0 ratio 10/23/2009   HGBA1C 6.3 11/14/2017   MICROALBUR 1.2 11/14/2017       Assessment & Plan:

## 2018-02-09 ENCOUNTER — Ambulatory Visit (INDEPENDENT_AMBULATORY_CARE_PROVIDER_SITE_OTHER): Payer: Medicare HMO | Admitting: Internal Medicine

## 2018-02-09 ENCOUNTER — Encounter: Payer: Self-pay | Admitting: Internal Medicine

## 2018-02-09 VITALS — BP 144/82 | HR 69 | Temp 98.4°F | Ht 68.0 in | Wt 201.0 lb

## 2018-02-09 DIAGNOSIS — I10 Essential (primary) hypertension: Secondary | ICD-10-CM | POA: Diagnosis not present

## 2018-02-09 DIAGNOSIS — J019 Acute sinusitis, unspecified: Secondary | ICD-10-CM | POA: Insufficient documentation

## 2018-02-09 DIAGNOSIS — M5416 Radiculopathy, lumbar region: Secondary | ICD-10-CM

## 2018-02-09 DIAGNOSIS — N401 Enlarged prostate with lower urinary tract symptoms: Secondary | ICD-10-CM

## 2018-02-09 DIAGNOSIS — E1165 Type 2 diabetes mellitus with hyperglycemia: Secondary | ICD-10-CM | POA: Diagnosis not present

## 2018-02-09 DIAGNOSIS — N138 Other obstructive and reflux uropathy: Secondary | ICD-10-CM | POA: Diagnosis not present

## 2018-02-09 MED ORDER — AZITHROMYCIN 250 MG PO TABS: ORAL_TABLET | ORAL | 1 refills | Status: DC

## 2018-02-09 MED ORDER — AMLODIPINE BESYLATE 5 MG PO TABS: 5.0000 mg | ORAL_TABLET | Freq: Every day | ORAL | 1 refills | Status: DC

## 2018-02-09 MED ORDER — CARVEDILOL 25 MG PO TABS: 25.0000 mg | ORAL_TABLET | Freq: Two times a day (BID) | ORAL | 1 refills | Status: DC

## 2018-02-09 MED ORDER — POTASSIUM CHLORIDE ER 10 MEQ PO TBCR: 20.0000 meq | EXTENDED_RELEASE_TABLET | Freq: Every day | ORAL | 1 refills | Status: DC

## 2018-02-09 MED ORDER — CLOPIDOGREL BISULFATE 75 MG PO TABS: 75.0000 mg | ORAL_TABLET | Freq: Every day | ORAL | 1 refills | Status: DC

## 2018-02-09 NOTE — Patient Instructions (Signed)
Please take all new medication as prescribed - the antibiotic, and the flomax for the prostate  Please continue all other medications as before, and refills have been done if requested.  Please have the pharmacy call with any other refills you may need.  Please continue your efforts at being more active, low cholesterol diet, and weight control  Please keep your appointments with your specialists as you may have planned  Please return in 6 months, or sooner if needed, with Lab testing done 3-5 days before

## 2018-02-09 NOTE — Progress Notes (Signed)
Subjective:    Patient ID: Travis Carey, male    DOB: 30-Oct-1940, 77 y.o.   MRN: [id number]  HPI   Here with 2-3 days acute onset fever, facial pain, pressure, headache, general weakness and malaise, and greenish d/c, with mild ST and cough, but pt denies chest pain, wheezing, increased sob or doe, orthopnea, PND, increased LE swelling, palpitations, dizziness or syncope. Took a decongestant last PM so BP up today  Right lumbar back pain improved with improved leg symptoms, has some constipation with pain med but now tolerable.  Pt denies new neurological symptoms such as new headache, or facial or extremity weakness or numbness   Pt denies polydipsia, polyuria No other new complaints or interval hx  Denies urinary symptoms such as dysuria, frequency, urgency, flank pain, hematuria but has several months slow urinary flow and weak stream and frequency/nocturia Past Medical History:  Diagnosis Date  . Allergic rhinitis, cause unspecified 01/31/2013  . ANEMIA-NOS 01/28/2010  . BPH (benign prostatic hypertrophy) 04/25/2013  . CAD 02/17/2010  . CAROTID BRUIT 11/18/2009  . Carpal tunnel syndrome 05/20/2008  . Cervical radiculitis 04/17/2012  . CHEST PAIN 09/26/2009  . DEPRESSION 06/12/2007  . DYSPNEA 07/28/2010  . ERECTILE DYSFUNCTION 02/24/2009  . GERD 06/09/2007  . GERD (gastroesophageal reflux disease)   . GLUCOSE INTOLERANCE 01/28/2010  . Gout 2017   Dr.Kristi Hyer  . HEMORRHOIDS 06/09/2007  . History of colonic polyps 08/07/2015  . HYPERLIPIDEMIA 02/07/2008  . HYPERTENSION 06/09/2007  . HYPOKALEMIA 01/28/2010  . Impaired glucose tolerance 01/23/2011  . LOW BACK PAIN 06/12/2007  . MYOCARDIAL PERFUSION SCAN, WITH STRESS TEST, ABNORMAL 10/08/2009  . PARESTHESIA 04/25/2008  . SHOULDER PAIN, LEFT 04/25/2008  . URI 08/19/2008  . VITAMIN B12 DEFICIENCY 02/09/2010   Past Surgical History:  Procedure Laterality Date  . COLONOSCOPY    . CORONARY STENT PLACEMENT     from right radial    reports that he has never  smoked. He has never used smokeless tobacco. He reports that he does not drink alcohol or use drugs. family history includes Alcohol abuse in his brother and sister; Coronary artery disease in his other; Heart attack in his other; Heart disease in his father and mother; Hyperlipidemia in his father and mother; Hypertension in his father, mother, and other; Stroke in his other. Allergies  Allergen Reactions  . Diclofenac Sodium     REACTION: maks pt drowsey   Current Outpatient Medications on File Prior to Visit  Medication Sig Dispense Refill  . aspirin EC 81 MG tablet Take 81 mg by mouth daily.    . colchicine 0.6 MG tablet Take 1 tablet (0.6 mg total) by mouth daily. (Patient taking differently: Take 0.6 mg by mouth as needed. ) 90 tablet 3  . dexlansoprazole (DEXILANT) 60 MG capsule Take 1 capsule (60 mg total) by mouth daily. 90 capsule 3  . fexofenadine (ALLEGRA) 180 MG tablet Take 1 tablet (180 mg total) by mouth daily. As needed (Patient taking differently: Take 180 mg by mouth daily as needed for allergies. ) 90 tablet 3  . fluticasone (FLONASE) 50 MCG/ACT nasal spray USE TWO SPRAYS IN EACH NOSTRIL ONCE DAILY 48 g 5  . gabapentin (NEURONTIN) 100 MG capsule Take 2 capsules (200 mg total) by mouth 3 (three) times daily. 180 capsule 5  . HYDROcodone-acetaminophen (NORCO/VICODIN) 5-325 MG tablet Take 1 tablet by mouth every 6 (six) hours as needed for moderate pain. 30 tablet 0  . lisinopril-hydrochlorothiazide (ZESTORETIC) 20-12.5 MG tablet Take  1 tablet by mouth daily. 90 tablet 3  . meloxicam (MOBIC) 7.5 MG tablet TAKE 1 TO 2 TABLETS ONE TIME DAILY AS NEEDED FOR PAIN 180 tablet 3  . pantoprazole (PROTONIX) 40 MG tablet Take 1 tablet (40 mg total) by mouth daily. 90 tablet 3  . rosuvastatin (CRESTOR) 10 MG tablet Take 1 tablet (10 mg total) by mouth daily. 90 tablet 1  . tamsulosin (FLOMAX) 0.4 MG CAPS capsule TAKE ONE CAPSULE BY MOUTH ONCE DAILY 90 capsule 3   No current  facility-administered medications on file prior to visit.    Review of Systems  Constitutional: Negative for other unusual diaphoresis or sweats HENT: Negative for ear discharge or swelling Eyes: Negative for other worsening visual disturbances Respiratory: Negative for stridor or other swelling  Gastrointestinal: Negative for worsening distension or other blood Genitourinary: Negative for retention or other urinary change Musculoskeletal: Negative for other MSK pain or swelling Skin: Negative for color change or other new lesions Neurological: Negative for worsening tremors and other numbness  Psychiatric/Behavioral: Negative for worsening agitation or other fatigue All other system neg per pt   Objective:   Physical Exam BP (!) 144/82   Pulse 69   Temp 98.4 F (36.9 C) (Oral)   Ht 5\' 8"  (1.727 m)   Wt 201 lb (91.2 kg)   SpO2 96%   BMI 30.56 kg/m  VS noted, mild ill Constitutional: Pt appears in NAD HENT: Head: NCAT.  Right Ear: External ear normal.  Left Ear: External ear normal.  Eyes: . Pupils are equal, round, and reactive to light. Conjunctivae and EOM are normal Nose: without d/c or deformity Neck: Neck supple. Gross normal ROM Cardiovascular: Normal rate and regular rhythm.   Pulmonary/Chest: Effort normal and breath sounds without rales or wheezing.  Abd:  Soft, NT, ND, + BS, no organomegaly Spine nontender Neurological: Pt is alert. At baseline orientation, motor grossly intact Skin: Skin is warm. No rashes, other new lesions, no LE edema Psychiatric: Pt behavior is normal without agitation  No other exam findings  Lab Results  Component Value Date   WBC 6.0 11/14/2017   HGB 12.7 (L) 11/14/2017   HCT 37.9 (L) 11/14/2017   PLT 218.0 11/14/2017   GLUCOSE 88 11/14/2017   CHOL 144 11/14/2017   TRIG 325.0 (H) 11/14/2017   HDL 36.50 (L) 11/14/2017   LDLDIRECT 63.0 11/14/2017   LDLCALC 64 04/08/2015   ALT 13 11/14/2017   AST 17 11/14/2017   NA 144  11/14/2017   K 3.6 11/14/2017   CL 103 11/14/2017   CREATININE 1.11 11/14/2017   BUN 17 11/14/2017   CO2 35 (H) 11/14/2017   TSH 2.69 11/14/2017   PSA 0.54 11/14/2017   INR 1.0 ratio 10/23/2009   HGBA1C 6.3 11/14/2017   MICROALBUR 1.2 11/14/2017       Assessment & Plan:

## 2018-02-12 NOTE — Assessment & Plan Note (Signed)
Ok for flomax asd,  to f/u any worsening symptoms or concerns  

## 2018-02-12 NOTE — Assessment & Plan Note (Signed)
stable overall by history and exam, recent data reviewed with pt, and pt to continue medical treatment as before,  to f/u any worsening symptoms or concerns Lab Results  Component Value Date   HGBA1C 6.3 11/14/2017

## 2018-02-12 NOTE — Assessment & Plan Note (Signed)
Symptoms improved,  to f/u any worsening symptoms or concerns

## 2018-02-12 NOTE — Assessment & Plan Note (Signed)
Mild elevated, likely situational, o/w stable overall by history and exam, recent data reviewed with pt, and pt to continue medical treatment as before,  to f/u any worsening symptoms or concerns BP Readings from Last 3 Encounters:  02/09/18 (!) 144/82  02/02/18 122/76  11/14/17 138/84

## 2018-02-12 NOTE — Assessment & Plan Note (Signed)
Mild to mod, for antibx course,  to f/u any worsening symptoms or concerns 

## 2018-02-16 ENCOUNTER — Other Ambulatory Visit: Payer: Self-pay | Admitting: Internal Medicine

## 2018-02-16 DIAGNOSIS — Z77018 Contact with and (suspected) exposure to other hazardous metals: Secondary | ICD-10-CM

## 2018-02-23 ENCOUNTER — Other Ambulatory Visit: Payer: Medicare HMO

## 2018-02-24 ENCOUNTER — Encounter: Payer: Self-pay | Admitting: Internal Medicine

## 2018-03-08 ENCOUNTER — Other Ambulatory Visit: Payer: Self-pay | Admitting: Internal Medicine

## 2018-03-09 ENCOUNTER — Ambulatory Visit
Admission: RE | Admit: 2018-03-09 | Discharge: 2018-03-09 | Disposition: A | Discharge status: Home / Self Care | Payer: Medicare HMO | Source: Ambulatory Visit | Attending: Internal Medicine | Admitting: Internal Medicine

## 2018-03-09 DIAGNOSIS — M48061 Spinal stenosis, lumbar region without neurogenic claudication: Secondary | ICD-10-CM | POA: Diagnosis not present

## 2018-03-09 DIAGNOSIS — Z135 Encounter for screening for eye and ear disorders: Secondary | ICD-10-CM | POA: Diagnosis not present

## 2018-03-09 DIAGNOSIS — Z77018 Contact with and (suspected) exposure to other hazardous metals: Secondary | ICD-10-CM | POA: Diagnosis not present

## 2018-03-09 DIAGNOSIS — M5416 Radiculopathy, lumbar region: Secondary | ICD-10-CM

## 2018-03-10 ENCOUNTER — Encounter: Payer: Self-pay | Admitting: Internal Medicine

## 2018-03-13 DIAGNOSIS — M4807 Spinal stenosis, lumbosacral region: Secondary | ICD-10-CM | POA: Diagnosis not present

## 2018-04-27 ENCOUNTER — Encounter: Payer: Self-pay | Admitting: Family

## 2018-04-27 ENCOUNTER — Ambulatory Visit (INDEPENDENT_AMBULATORY_CARE_PROVIDER_SITE_OTHER): Payer: Medicare HMO | Admitting: Family

## 2018-04-27 VITALS — BP 152/78 | HR 53 | Temp 98.6°F | Ht 68.0 in | Wt 204.1 lb

## 2018-04-27 DIAGNOSIS — M5416 Radiculopathy, lumbar region: Secondary | ICD-10-CM | POA: Diagnosis not present

## 2018-04-27 MED ORDER — PREDNISONE 10 MG PO TABS: ORAL_TABLET | ORAL | 0 refills | Status: DC

## 2018-04-27 MED ORDER — MELOXICAM 7.5 MG PO TABS: ORAL_TABLET | ORAL | 0 refills | Status: DC

## 2018-04-27 NOTE — Progress Notes (Signed)
Travis Carey is a 77 y.o. male with the following history as recorded in [employer]:  Patient Active Problem List   Diagnosis Date Noted  . Acute sinus infection 02/09/2018  . Right lumbar radiculopathy 02/02/2018  . Right Achilles tendinitis 06/08/2017  . Swelling of extremity 06/08/2017  . Achilles tendon disorder, right 05/03/2017  . Acute gouty arthritis 09/16/2016  . Epididymo-orchitis 08/05/2016  . Vertigo 05/05/2016  . History of colonic polyps 08/07/2015  . CVA (cerebral infarction) 04/07/2015  . Rhinitis, allergic 03/29/2015  . Diabetes type 2, uncontrolled ([place]) 03/29/2015  . Right arm pain 11/27/2014  . Epididymal cyst 06/12/2014  . Left wrist pain 11/01/2013  . Right wrist pain 11/01/2013  . Nocturia 11/01/2013  . Encounter for well adult exam with abnormal findings 11/01/2013  . Obese 09/12/2013  . Benign prostatic hyperplasia 04/25/2013  . Knee pain, bilateral 04/25/2013  . Chest pain 01/31/2013  . Eustachian tube dysfunction 01/31/2013  . Cough 01/31/2013  . Allergic rhinitis 01/31/2013  . Cervical radiculitis 04/17/2012  . Cervical disc disease 04/17/2012  . Paresthesia of arm 12/08/2011  . Hypertension 12/08/2011  . Myalgia 11/05/2011  . B12 deficiency 11/05/2011  . Fatigue 07/27/2011  . COPD (chronic obstructive pulmonary disease) ([place]) 01/26/2011  . Left knee pain 01/26/2011  . Coronary atherosclerosis 02/17/2010  . VITAMIN B12 DEFICIENCY 02/09/2010  . Hypokalemia 01/28/2010  . ANEMIA-NOS 01/28/2010  . CAROTID BRUIT 11/18/2009  . ERECTILE DYSFUNCTION 02/24/2009  . Carpal tunnel syndrome 05/20/2008  . PARESTHESIA 04/25/2008  . Hyperlipidemia 02/07/2008  . DEPRESSION 06/12/2007  . LOW BACK PAIN 06/12/2007  . Gastroesophageal reflux disease without esophagitis 06/09/2007    Current Outpatient Medications  Medication Sig Dispense Refill  . amLODipine (NORVASC) 5 MG tablet Take 1 tablet (5 mg total) by mouth daily. 90 tablet 1  . aspirin EC 81 MG  tablet Take 81 mg by mouth daily.    Marland Kitchen azithromycin (ZITHROMAX Z-PAK) 250 MG tablet 2 tab by mouth day 1, then 1 per day 6 tablet 1  . carvedilol (COREG) 25 MG tablet Take 1 tablet (25 mg total) by mouth 2 (two) times daily. 180 tablet 1  . clopidogrel (PLAVIX) 75 MG tablet Take 1 tablet (75 mg total) by mouth daily. 90 tablet 1  . colchicine 0.6 MG tablet Take 1 tablet (0.6 mg total) by mouth daily. (Patient taking differently: Take 0.6 mg by mouth as needed. ) 90 tablet 3  . dexlansoprazole (DEXILANT) 60 MG capsule Take 1 capsule (60 mg total) by mouth daily. 90 capsule 3  . fexofenadine (ALLEGRA) 180 MG tablet Take 1 tablet (180 mg total) by mouth daily. As needed (Patient taking differently: Take 180 mg by mouth daily as needed for allergies. ) 90 tablet 3  . fluticasone (FLONASE) 50 MCG/ACT nasal spray USE TWO SPRAYS IN EACH NOSTRIL ONCE DAILY 48 g 5  . gabapentin (NEURONTIN) 100 MG capsule Take 2 capsules (200 mg total) by mouth 3 (three) times daily. 180 capsule 5  . HYDROcodone-acetaminophen (NORCO/VICODIN) 5-325 MG tablet Take 1 tablet by mouth every 6 (six) hours as needed for moderate pain. 30 tablet 0  . lisinopril-hydrochlorothiazide (ZESTORETIC) 20-12.5 MG tablet Take 1 tablet by mouth daily. 90 tablet 3  . meloxicam (MOBIC) 7.5 MG tablet TAKE 1 TO 2 TABLETS ONE TIME DAILY AS NEEDED FOR PAIN 180 tablet 0  . pantoprazole (PROTONIX) 40 MG tablet Take 1 tablet (40 mg total) by mouth daily. 90 tablet 3  . potassium chloride (K-DUR) 10  MEQ tablet Take 2 tablets (20 mEq total) by mouth daily. 180 tablet 1  . rosuvastatin (CRESTOR) 10 MG tablet Take 1 tablet (10 mg total) by mouth daily. 90 tablet 1  . tamsulosin (FLOMAX) 0.4 MG CAPS capsule TAKE ONE CAPSULE BY MOUTH ONCE DAILY 90 capsule 3  . predniSONE (DELTASONE) 10 MG tablet 3 tabs by mouth per day for 3 days,2tabs per day for 3 days,1tab per day for 3 days 18 tablet 0   No current facility-administered medications for this visit.      Allergies: Diclofenac sodium  Past Medical History:  Diagnosis Date  . Allergic rhinitis, cause unspecified 01/31/2013  . ANEMIA-NOS 01/28/2010  . BPH (benign prostatic hypertrophy) 04/25/2013  . CAD 02/17/2010  . CAROTID BRUIT 11/18/2009  . Carpal tunnel syndrome 05/20/2008  . Cervical radiculitis 04/17/2012  . CHEST PAIN 09/26/2009  . DEPRESSION 06/12/2007  . DYSPNEA 07/28/2010  . ERECTILE DYSFUNCTION 02/24/2009  . GERD 06/09/2007  . GERD (gastroesophageal reflux disease)   . GLUCOSE INTOLERANCE 01/28/2010  . Gout 2017   Dr.John  . HEMORRHOIDS 06/09/2007  . History of colonic polyps 08/07/2015  . HYPERLIPIDEMIA 02/07/2008  . HYPERTENSION 06/09/2007  . HYPOKALEMIA 01/28/2010  . Impaired glucose tolerance 01/23/2011  . LOW BACK PAIN 06/12/2007  . MYOCARDIAL PERFUSION SCAN, WITH STRESS TEST, ABNORMAL 10/08/2009  . PARESTHESIA 04/25/2008  . SHOULDER PAIN, LEFT 04/25/2008  . URI 08/19/2008  . VITAMIN B12 DEFICIENCY 02/09/2010    Past Surgical History:  Procedure Laterality Date  . COLONOSCOPY    . CORONARY STENT PLACEMENT     from right radial    Family History  Problem Relation Age of Onset  . Alcohol abuse Sister        ETOH dependence  . Alcohol abuse Brother        ETOH  dependence  . Heart attack Other   . Stroke Other   . Hypertension Other   . Coronary artery disease Other   . Hyperlipidemia Mother   . Heart disease Mother   . Hypertension Mother   . Hyperlipidemia Father   . Heart disease Father   . Hypertension Father     Social History   Tobacco Use  . Smoking status: Never Smoker  . Smokeless tobacco: Never Used  Substance Use Topics  . Alcohol use: No    Subjective:  Patient has documented back issues/ history of radiculopathy; MRI was done earlier this year; has seen neurosurgeon but not a surgical candidate; having increased flare of symptoms in the past few weeks; requesting short-term treatment with prednisone; no changes or bowel or bladder habits; also needs  updated Rx for his Mobic;    Objective:  Vitals:   04/27/18 1611  BP: (!) 152/78  Pulse: (!) 53  Temp: 98.6 F (37 C)  TempSrc: Oral  SpO2: 98%  Weight: 204 lb 1.3 oz (92.6 kg)  Height: 5\' 8"  (1.727 m)    General: Well developed, well nourished, in no acute distress  Skin : Warm and dry.  Head: Normocephalic and atraumatic  Lungs: Respirations unlabored; clear to auscultation bilaterally without wheeze, rales, rhonchi  CVS exam: normal rate and regular rhythm.  Musculoskeletal: No deformities; no active joint inflammation  Extremities: No edema, cyanosis, clubbing  Vessels: Symmetric bilaterally  Neurologic: Alert and oriented; speech intact; face symmetrical; moves all extremities well; CNII-XII intact without focal deficit  Assessment:  1. Right lumbar radiculopathy     Plan:  Refills updated as requested; refer to PT;  he will plan to see his PCP as needed otherwise.   No follow-ups on file.  Orders Placed This Encounter  Procedures  . Ambulatory referral to Physical Therapy    Referral Priority:   Routine    Referral Type:   Physical Medicine    Referral Reason:   Specialty Services Required    Requested Specialty:   Physical Therapy    Number of Visits Requested:   1    Requested Prescriptions   Signed Prescriptions Disp Refills  . meloxicam (MOBIC) 7.5 MG tablet 180 tablet 0    Sig: TAKE 1 TO 2 TABLETS ONE TIME DAILY AS NEEDED FOR PAIN  . predniSONE (DELTASONE) 10 MG tablet 18 tablet 0    Sig: 3 tabs by mouth per day for 3 days,2tabs per day for 3 days,1tab per day for 3 days

## 2018-05-04 ENCOUNTER — Ambulatory Visit: Payer: Medicare HMO | Admitting: Physical Therapy

## 2018-05-09 ENCOUNTER — Ambulatory Visit: Payer: Medicare HMO | Admitting: Physical Therapy

## 2018-05-11 ENCOUNTER — Encounter: Payer: Medicare HMO | Admitting: Physical Therapy

## 2018-05-15 ENCOUNTER — Encounter: Payer: Medicare HMO | Admitting: Physical Therapy

## 2018-05-22 ENCOUNTER — Other Ambulatory Visit: Payer: Self-pay | Admitting: Internal Medicine

## 2018-06-01 ENCOUNTER — Ambulatory Visit: Payer: Medicare HMO | Admitting: Internal Medicine

## 2018-06-05 ENCOUNTER — Ambulatory Visit (INDEPENDENT_AMBULATORY_CARE_PROVIDER_SITE_OTHER): Payer: Medicare HMO | Admitting: Internal Medicine

## 2018-06-05 ENCOUNTER — Encounter: Payer: Self-pay | Admitting: Internal Medicine

## 2018-06-05 VITALS — BP 144/76 | HR 69 | Temp 98.7°F | Ht 68.0 in | Wt 209.0 lb

## 2018-06-05 DIAGNOSIS — E782 Mixed hyperlipidemia: Secondary | ICD-10-CM

## 2018-06-05 DIAGNOSIS — E1165 Type 2 diabetes mellitus with hyperglycemia: Secondary | ICD-10-CM

## 2018-06-05 DIAGNOSIS — N453 Epididymo-orchitis: Secondary | ICD-10-CM

## 2018-06-05 DIAGNOSIS — Z0000 Encounter for general adult medical examination without abnormal findings: Secondary | ICD-10-CM | POA: Diagnosis not present

## 2018-06-05 LAB — POCT GLYCOSYLATED HEMOGLOBIN (HGB A1C)
HBA1C, POC (PREDIABETIC RANGE): 0 % — AB (ref 5.7–6.4)
HbA1c POC (<> result, manual entry): 0 % (ref 4.0–5.6)
HbA1c, POC (controlled diabetic range): 0 % (ref 0.0–7.0)
Hemoglobin A1C: 5.8 % — AB (ref 4.0–5.6)

## 2018-06-05 MED ORDER — DOXYCYCLINE HYCLATE 100 MG PO TABS: 100.0000 mg | ORAL_TABLET | Freq: Two times a day (BID) | ORAL | 0 refills | Status: DC

## 2018-06-05 NOTE — Assessment & Plan Note (Signed)
Left sided, for doxy course,  to f/u any worsening symptoms or concerns

## 2018-06-05 NOTE — Patient Instructions (Addendum)
Your A1c was OK today  Please take all new medication as prescribed - the antibiotic  Please continue all other medications as before, and refills have been done if requested.  Please have the pharmacy call with any other refills you may need.  Please continue your efforts at being more active, low cholesterol diet, and weight control.  You are otherwise up to date with prevention measures today.  Please keep your appointments with your specialists as you may have planned  Please return in 6 months, or sooner if needed, with Lab testing done 3-5 days before

## 2018-06-05 NOTE — Progress Notes (Signed)
Subjective:    Patient ID: Travis Carey, male    DOB: 06-03-1941, 77 y.o.   MRN: [id number]  HPI  Here to f/u; overall doing ok,  Pt denies chest pain, increasing sob or doe, wheezing, orthopnea, PND, increased LE swelling, palpitations, dizziness or syncope.  Pt denies new neurological symptoms such as new headache, or facial or extremity weakness or numbness.  Pt denies polydipsia, polyuria, or low sugar episode.  Pt states overall good compliance with meds, mostly trying to follow appropriate diet, with wt overall stable,  but little exercise however. States RLE radicular pain improved with predpac and gabapentin.   BP has been < 140/90's at home.  Does have pain, swelling for 3 days mild constant to left testicle but Denies urinary symptoms such as dysuria, frequency, urgency, flank pain, hematuria or n/v, fever, chills. Past Medical History:  Diagnosis Date  . Allergic rhinitis, cause unspecified 01/31/2013  . ANEMIA-NOS 01/28/2010  . BPH (benign prostatic hypertrophy) 04/25/2013  . CAD 02/17/2010  . CAROTID BRUIT 11/18/2009  . Carpal tunnel syndrome 05/20/2008  . Cervical radiculitis 04/17/2012  . CHEST PAIN 09/26/2009  . DEPRESSION 06/12/2007  . DYSPNEA 07/28/2010  . ERECTILE DYSFUNCTION 02/24/2009  . GERD 06/09/2007  . GERD (gastroesophageal reflux disease)   . GLUCOSE INTOLERANCE 01/28/2010  . Gout 2017   Dr.Alexes Menchaca  . HEMORRHOIDS 06/09/2007  . History of colonic polyps 08/07/2015  . HYPERLIPIDEMIA 02/07/2008  . HYPERTENSION 06/09/2007  . HYPOKALEMIA 01/28/2010  . Impaired glucose tolerance 01/23/2011  . LOW BACK PAIN 06/12/2007  . MYOCARDIAL PERFUSION SCAN, WITH STRESS TEST, ABNORMAL 10/08/2009  . PARESTHESIA 04/25/2008  . SHOULDER PAIN, LEFT 04/25/2008  . URI 08/19/2008  . VITAMIN B12 DEFICIENCY 02/09/2010   Past Surgical History:  Procedure Laterality Date  . COLONOSCOPY    . CORONARY STENT PLACEMENT     from right radial    reports that he has never smoked. He has never used smokeless  tobacco. He reports that he does not drink alcohol or use drugs. family history includes Alcohol abuse in his brother and sister; Coronary artery disease in his other; Heart attack in his other; Heart disease in his father and mother; Hyperlipidemia in his father and mother; Hypertension in his father, mother, and other; Stroke in his other. Allergies  Allergen Reactions  . Diclofenac Sodium     REACTION: maks pt drowsey   Current Outpatient Medications on File Prior to Visit  Medication Sig Dispense Refill  . amLODipine (NORVASC) 5 MG tablet Take 1 tablet (5 mg total) by mouth daily. 90 tablet 1  . aspirin EC 81 MG tablet Take 81 mg by mouth daily.    . carvedilol (COREG) 25 MG tablet Take 1 tablet (25 mg total) by mouth 2 (two) times daily. 180 tablet 1  . clopidogrel (PLAVIX) 75 MG tablet Take 1 tablet (75 mg total) by mouth daily. 90 tablet 1  . colchicine 0.6 MG tablet Take 1 tablet (0.6 mg total) by mouth daily. (Patient taking differently: Take 0.6 mg by mouth as needed. ) 90 tablet 3  . dexlansoprazole (DEXILANT) 60 MG capsule Take 1 capsule (60 mg total) by mouth daily. 90 capsule 3  . fexofenadine (ALLEGRA) 180 MG tablet Take 1 tablet (180 mg total) by mouth daily. As needed (Patient taking differently: Take 180 mg by mouth daily as needed for allergies. ) 90 tablet 3  . fluticasone (FLONASE) 50 MCG/ACT nasal spray USE TWO SPRAYS IN EACH NOSTRIL ONCE DAILY 48  g 5  . gabapentin (NEURONTIN) 100 MG capsule Take 2 capsules (200 mg total) by mouth 3 (three) times daily. 180 capsule 5  . HYDROcodone-acetaminophen (NORCO/VICODIN) 5-325 MG tablet Take 1 tablet by mouth every 6 (six) hours as needed for moderate pain. 30 tablet 0  . lisinopril-hydrochlorothiazide (ZESTORETIC) 20-12.5 MG tablet Take 1 tablet by mouth daily. 90 tablet 3  . meloxicam (MOBIC) 7.5 MG tablet TAKE 1 TO 2 TABLETS ONE TIME DAILY AS NEEDED FOR PAIN 180 tablet 0  . pantoprazole (PROTONIX) 40 MG tablet Take 1 tablet (40 mg  total) by mouth daily. 90 tablet 3  . potassium chloride (K-DUR) 10 MEQ tablet Take 2 tablets (20 mEq total) by mouth daily. 180 tablet 1  . rosuvastatin (CRESTOR) 10 MG tablet Take 1 tablet (10 mg total) by mouth daily. 90 tablet 1  . tamsulosin (FLOMAX) 0.4 MG CAPS capsule TAKE 1 CAPSULE BY MOUTH ONCE DAILY 90 capsule 1   No current facility-administered medications on file prior to visit.    Review of Systems  Constitutional: Negative for other unusual diaphoresis or sweats HENT: Negative for ear discharge or swelling Eyes: Negative for other worsening visual disturbances Respiratory: Negative for stridor or other swelling  Gastrointestinal: Negative for worsening distension or other blood Genitourinary: Negative for retention or other urinary change Musculoskeletal: Negative for other MSK pain or swelling Skin: Negative for color change or other new lesions Neurological: Negative for worsening tremors and other numbness  Psychiatric/Behavioral: Negative for worsening agitation or other fatigue All other system neg per pt    Objective:   Physical Exam BP (!) 144/76   Pulse 69   Temp 98.7 F (37.1 C) (Oral)   Ht 5\' 8"  (1.727 m)   Wt 209 lb (94.8 kg)   SpO2 94%   BMI 31.78 kg/m  VS noted,  Constitutional: Pt appears in NAD HENT: Head: NCAT.  Right Ear: External ear normal.  Left Ear: External ear normal.  Eyes: . Pupils are equal, round, and reactive to light. Conjunctivae and EOM are normal Nose: without d/c or deformity Neck: Neck supple. Gross normal ROM Cardiovascular: Normal rate and regular rhythm.   Pulmonary/Chest: Effort normal and breath sounds without rales or wheezing.  Abd:  Soft, NT, ND, + BS, no organomegaly GU: tender 1+ swelling to left testicle Neurological: Pt is alert. At baseline orientation, motor grossly intact Skin: Skin is warm. No rashes, other new lesions, no LE edema Psychiatric: Pt behavior is normal without agitation  No other exam  findings  POCT - a1c - POCT glycosylated hemoglobin (Hb A1C)     Ref Range & Units 16:10 58mo ago 82mo ago 35yr ago  Hemoglobin A1C 4.0 - 5.6 % 5.8Abnormal   6.3 R, CM 6.1 R, CM 6.4 R, CM           Assessment & Plan:

## 2018-06-05 NOTE — Assessment & Plan Note (Signed)
Lab Results  Component Value Date   LDLCALC 64 04/08/2015  stable overall by history and exam, recent data reviewed with pt, and pt to continue medical treatment as before,  to f/u any worsening symptoms or concerns

## 2018-06-05 NOTE — Assessment & Plan Note (Signed)
stable overall by history and exam, recent data reviewed with pt, and pt to continue medical treatment as before,  to f/u any worsening symptoms or concerns Lab Results  Component Value Date   HGBA1C 5.8 (A) 06/05/2018   HGBA1C 0 06/05/2018   HGBA1C 0 (A) 06/05/2018   HGBA1C 0.0 06/05/2018

## 2018-06-28 ENCOUNTER — Other Ambulatory Visit: Payer: Self-pay | Admitting: Internal Medicine

## 2018-07-22 ENCOUNTER — Emergency Department
Admission: EM | Admit: 2018-07-22 | Discharge: 2018-07-22 | Disposition: A | Discharge status: Home / Self Care | Payer: Medicare HMO | Attending: Emergency Medicine | Admitting: Emergency Medicine

## 2018-07-22 ENCOUNTER — Encounter: Payer: Self-pay | Admitting: Emergency Medicine

## 2018-07-22 ENCOUNTER — Other Ambulatory Visit: Payer: Self-pay

## 2018-07-22 DIAGNOSIS — Z955 Presence of coronary angioplasty implant and graft: Secondary | ICD-10-CM | POA: Insufficient documentation

## 2018-07-22 DIAGNOSIS — I10 Essential (primary) hypertension: Secondary | ICD-10-CM | POA: Insufficient documentation

## 2018-07-22 DIAGNOSIS — R200 Anesthesia of skin: Secondary | ICD-10-CM | POA: Diagnosis present

## 2018-07-22 DIAGNOSIS — G609 Hereditary and idiopathic neuropathy, unspecified: Secondary | ICD-10-CM

## 2018-07-22 DIAGNOSIS — Z79899 Other long term (current) drug therapy: Secondary | ICD-10-CM | POA: Insufficient documentation

## 2018-07-22 DIAGNOSIS — E119 Type 2 diabetes mellitus without complications: Secondary | ICD-10-CM | POA: Insufficient documentation

## 2018-07-22 DIAGNOSIS — Z8673 Personal history of transient ischemic attack (TIA), and cerebral infarction without residual deficits: Secondary | ICD-10-CM | POA: Diagnosis not present

## 2018-07-22 DIAGNOSIS — J449 Chronic obstructive pulmonary disease, unspecified: Secondary | ICD-10-CM | POA: Insufficient documentation

## 2018-07-22 DIAGNOSIS — I2510 Atherosclerotic heart disease of native coronary artery without angina pectoris: Secondary | ICD-10-CM | POA: Diagnosis not present

## 2018-07-22 DIAGNOSIS — Z7982 Long term (current) use of aspirin: Secondary | ICD-10-CM | POA: Diagnosis not present

## 2018-07-22 DIAGNOSIS — I252 Old myocardial infarction: Secondary | ICD-10-CM | POA: Diagnosis not present

## 2018-07-22 MED ORDER — METHYLPREDNISOLONE 4 MG PO TBPK: ORAL_TABLET | ORAL | 0 refills | Status: DC

## 2018-07-22 NOTE — ED Notes (Signed)
See triage note.  Presents with pain to both wrists/hands   States pain wakes him around 2 am  usually puts on his splints  But pain has gotten worse

## 2018-07-22 NOTE — ED Provider Notes (Signed)
[facility] Emergency Department Provider Note   ____________________________________________   First MD Initiated Contact with Patient 07/22/18 1227     (approximate)  I have reviewed the triage vital signs and the nursing notes.   HISTORY  Chief Complaint Numbness    HPI Travis Carey is a 77 y.o. male patient presents with bilateral upper and lower extremity numbness.  Patient state complaint increases when sleeping.  Patient state he has been using intimating doses of steroids from his PCP which improves his symptoms but it comes back.  Patient state he is scheduled for a "procedure" next week.  Patient last use of steroids was greater than 3 months ago.  Patient rates his pain as a 10/10.  Patient described pain is "aching".  No current palliative measures for complaint.   Past Medical History:  Diagnosis Date  . Allergic rhinitis, cause unspecified 01/31/2013  . ANEMIA-NOS 01/28/2010  . BPH (benign prostatic hypertrophy) 04/25/2013  . CAD 02/17/2010  . CAROTID BRUIT 11/18/2009  . Carpal tunnel syndrome 05/20/2008  . Cervical radiculitis 04/17/2012  . CHEST PAIN 09/26/2009  . DEPRESSION 06/12/2007  . DYSPNEA 07/28/2010  . ERECTILE DYSFUNCTION 02/24/2009  . GERD 06/09/2007  . GERD (gastroesophageal reflux disease)   . GLUCOSE INTOLERANCE 01/28/2010  . Gout 2017   Dr.John  . HEMORRHOIDS 06/09/2007  . History of colonic polyps 08/07/2015  . HYPERLIPIDEMIA 02/07/2008  . HYPERTENSION 06/09/2007  . HYPOKALEMIA 01/28/2010  . Impaired glucose tolerance 01/23/2011  . LOW BACK PAIN 06/12/2007  . MYOCARDIAL PERFUSION SCAN, WITH STRESS TEST, ABNORMAL 10/08/2009  . PARESTHESIA 04/25/2008  . SHOULDER PAIN, LEFT 04/25/2008  . URI 08/19/2008  . VITAMIN B12 DEFICIENCY 02/09/2010    Patient Active Problem List   Diagnosis Date Noted  . Acute sinus infection 02/09/2018  . Right lumbar radiculopathy 02/02/2018  . Right Achilles tendinitis 06/08/2017  . Swelling of extremity  06/08/2017  . Achilles tendon disorder, right 05/03/2017  . Acute gouty arthritis 09/16/2016  . Epididymo-orchitis 08/05/2016  . Vertigo 05/05/2016  . History of colonic polyps 08/07/2015  . CVA (cerebral infarction) 04/07/2015  . Rhinitis, allergic 03/29/2015  . Diabetes type 2, uncontrolled ([place]) 03/29/2015  . Right arm pain 11/27/2014  . Epididymal cyst 06/12/2014  . Left wrist pain 11/01/2013  . Right wrist pain 11/01/2013  . Nocturia 11/01/2013  . Encounter for well adult exam with abnormal findings 11/01/2013  . Obese 09/12/2013  . Benign prostatic hyperplasia 04/25/2013  . Knee pain, bilateral 04/25/2013  . Chest pain 01/31/2013  . Eustachian tube dysfunction 01/31/2013  . Cough 01/31/2013  . Allergic rhinitis 01/31/2013  . Cervical radiculitis 04/17/2012  . Cervical disc disease 04/17/2012  . Paresthesia of arm 12/08/2011  . Hypertension 12/08/2011  . Myalgia 11/05/2011  . B12 deficiency 11/05/2011  . Fatigue 07/27/2011  . COPD (chronic obstructive pulmonary disease) ([place]) 01/26/2011  . Left knee pain 01/26/2011  . Coronary atherosclerosis 02/17/2010  . VITAMIN B12 DEFICIENCY 02/09/2010  . Hypokalemia 01/28/2010  . ANEMIA-NOS 01/28/2010  . CAROTID BRUIT 11/18/2009  . ERECTILE DYSFUNCTION 02/24/2009  . Carpal tunnel syndrome 05/20/2008  . PARESTHESIA 04/25/2008  . Hyperlipidemia 02/07/2008  . DEPRESSION 06/12/2007  . LOW BACK PAIN 06/12/2007  . Gastroesophageal reflux disease without esophagitis 06/09/2007    Past Surgical History:  Procedure Laterality Date  . COLONOSCOPY    . CORONARY STENT PLACEMENT     from right radial    Prior to Admission medications   Medication Sig Start Date  End Date Taking? Authorizing Provider  amLODipine (NORVASC) 5 MG tablet Take 1 tablet (5 mg total) by mouth daily. 02/09/18   Biagio Borg, MD  aspirin EC 81 MG tablet Take 81 mg by mouth daily.    [provider]  carvedilol (COREG) 25 MG tablet Take 1 tablet (25  mg total) by mouth 2 (two) times daily. 02/09/18 02/09/19  Biagio Borg, MD  clopidogrel (PLAVIX) 75 MG tablet Take 1 tablet (75 mg total) by mouth daily. 02/09/18   Biagio Borg, MD  colchicine 0.6 MG tablet Take 1 tablet (0.6 mg total) by mouth daily. Patient taking differently: Take 0.6 mg by mouth as needed.  11/01/13   Biagio Borg, MD  dexlansoprazole (DEXILANT) 60 MG capsule Take 1 capsule (60 mg total) by mouth daily. 01/31/13   Biagio Borg, MD  doxycycline (VIBRA-TABS) 100 MG tablet Take 1 tablet (100 mg total) by mouth 2 (two) times daily. 06/05/18   Biagio Borg, MD  fexofenadine (ALLEGRA) 180 MG tablet Take 1 tablet (180 mg total) by mouth daily. As needed Patient taking differently: Take 180 mg by mouth daily as needed for allergies.  06/12/14   Biagio Borg, MD  fluticasone [facility]) 50 MCG/ACT nasal spray USE TWO SPRAYS IN [facility] NOSTRIL ONCE DAILY 08/06/16   Biagio Borg, MD  gabapentin (NEURONTIN) 100 MG capsule Take 2 capsules (200 mg total) by mouth 3 (three) times daily. 02/02/18   Biagio Borg, MD  HYDROcodone-acetaminophen (NORCO/VICODIN) 5-325 MG tablet Take 1 tablet by mouth every 6 (six) hours as needed for moderate pain. 02/02/18   Biagio Borg, MD  lisinopril-hydrochlorothiazide (ZESTORETIC) 20-12.5 MG tablet Take 1 tablet by mouth daily. 11/14/17   Biagio Borg, MD  meloxicam (MOBIC) 7.5 MG tablet TAKE 1 TO 2 TABLETS ONE TIME DAILY AS NEEDED FOR PAIN 04/27/18   Marrian Salvage, FNP  methylPREDNISolone (MEDROL DOSEPAK) 4 MG TBPK tablet Take Tapered dose as directed 07/22/18   Sable Feil, PA-C  pantoprazole (PROTONIX) 40 MG tablet Take 1 tablet (40 mg total) by mouth daily. 11/14/17 11/14/18  Biagio Borg, MD  potassium chloride (K-DUR) 10 MEQ tablet Take 2 tablets (20 mEq total) by mouth daily. 02/09/18   Biagio Borg, MD  rosuvastatin (CRESTOR) 10 MG tablet TAKE 1 TABLET BY MOUTH ONCE DAILY 06/28/18   Biagio Borg, MD  tamsulosin (FLOMAX) 0.4 MG CAPS capsule TAKE 1  CAPSULE BY MOUTH ONCE DAILY 05/23/18   Biagio Borg, MD    Allergies Diclofenac sodium  Family History  Problem Relation Age of Onset  . Alcohol abuse Sister        ETOH dependence  . Alcohol abuse Brother        ETOH  dependence  . Heart attack Other   . Stroke Other   . Hypertension Other   . Coronary artery disease Other   . Hyperlipidemia Mother   . Heart disease Mother   . Hypertension Mother   . Hyperlipidemia Father   . Heart disease Father   . Hypertension Father     Social History Social History   Tobacco Use  . Smoking status: Never Smoker  . Smokeless tobacco: Never Used  Substance Use Topics  . Alcohol use: No  . Drug use: No    Review of Systems  Constitutional: No fever/chills Eyes: No visual changes. ENT: No sore throat. Cardiovascular: Denies chest pain. Respiratory: Denies shortness of breath. Gastrointestinal: No  abdominal pain.  No nausea, no vomiting.  No diarrhea.  No constipation. Genitourinary: Negative for dysuria.  BPH and erectile dysfunction. Musculoskeletal: Negative for back pain. Skin: Negative for rash. Neurological: Negative for headaches and denies focal weakness.  Numbness to the upper and lower extremities. Psychiatric: Depression Endocrine:Glucose intolerance, gout, hyperlipidemia, and hypertension. Allergic/Immunilogical: See medication list.  ____________________________________________   PHYSICAL EXAM:  VITAL SIGNS: ED Triage Vitals  Enc Vitals Group     BP 07/22/18 1156 (!) 151/74     Pulse Rate 07/22/18 1156 (!) 56     Resp 07/22/18 1156 14     Temp 07/22/18 1156 (!) 97.5 F (36.4 C)     Temp Source 07/22/18 1156 Oral     SpO2 07/22/18 1156 98 %     Weight 07/22/18 1155 204 lb (92.5 kg)     Height 07/22/18 1155 5\' 8"  (1.727 m)     Head Circumference --      Peak Flow --      Pain Score 07/22/18 1208 10     Pain Loc --      Pain Edu? --      Excl. in [place]? --     Constitutional: Alert and oriented. Well  appearing and in no acute distress. Cardiovascular: Normal rate, regular rhythm. Grossly normal heart sounds.  Good peripheral circulation. Respiratory: Normal respiratory effort.  No retractions. Lungs CTAB. Gastrointestinal: Soft and nontender. No distention. No abdominal bruits. No CVA tenderness. Musculoskeletal: No lower extremity tenderness nor edema.  No joint effusions. Neurologic:  Normal speech and language. No gross focal neurologic deficits are appreciated. No gait instability. Skin:  Skin is warm, dry and intact. No rash noted. Psychiatric: Mood and affect are normal. Speech and behavior are normal.  ____________________________________________   LABS (all labs ordered are listed, but only abnormal results are displayed)  Labs Reviewed - No data to display ____________________________________________  EKG   ____________________________________________  RADIOLOGY  ED MD interpretation:    Official radiology report(s): No results found.  ____________________________________________   PROCEDURES  Procedure(s) performed: None  Procedures  Critical Care performed: No  ____________________________________________   INITIAL IMPRESSION / ASSESSMENT AND PLAN / ED COURSE  As part of my medical decision making, I reviewed the following data within the electronic MEDICAL RECORD NUMBER    Numbness to upper and lower extremity secondary to peripheral neuropathy.  Patient given discharge care instructions and a prescription for Medrol Dosepak.  Advised patient follow-up with schedule appointment.      ____________________________________________   FINAL CLINICAL IMPRESSION(S) / ED DIAGNOSES  Final diagnoses:  Idiopathic peripheral neuropathy     ED Discharge Orders         Ordered    methylPREDNISolone (MEDROL DOSEPAK) 4 MG TBPK tablet     07/22/18 1238           Note:  This document was prepared using Dragon voice recognition software and may  include unintentional dictation errors.    Sable Feil, PA-C 07/22/18 1247    Nance Pear, MD 07/22/18 [phone]

## 2018-07-22 NOTE — Discharge Instructions (Signed)
Closely monitor glucose levels while taking steroids.

## 2018-07-22 NOTE — ED Triage Notes (Signed)
Pt to ed with c/o bilat feet/hand numbness that happens when he is sleeping x 1 year.  Pt states he usually uses steroids for this and then feels better but it comes back repeatedly.  States he needs to have a "procedure" on his nerves done by his doctor and will schedule that for next week.  States "I just need to get the steroids to last me until I can see my doctor"

## 2018-08-02 ENCOUNTER — Other Ambulatory Visit: Payer: Self-pay | Admitting: Internal Medicine

## 2018-08-17 ENCOUNTER — Other Ambulatory Visit (INDEPENDENT_AMBULATORY_CARE_PROVIDER_SITE_OTHER): Payer: Medicare HMO

## 2018-08-17 ENCOUNTER — Ambulatory Visit (INDEPENDENT_AMBULATORY_CARE_PROVIDER_SITE_OTHER)
Admission: RE | Admit: 2018-08-17 | Discharge: 2018-08-17 | Disposition: A | Discharge status: Home / Self Care | Payer: Medicare HMO | Source: Ambulatory Visit | Attending: Internal Medicine | Admitting: Internal Medicine

## 2018-08-17 ENCOUNTER — Ambulatory Visit (INDEPENDENT_AMBULATORY_CARE_PROVIDER_SITE_OTHER): Payer: Medicare HMO | Admitting: Internal Medicine

## 2018-08-17 ENCOUNTER — Encounter: Payer: Self-pay | Admitting: Internal Medicine

## 2018-08-17 VITALS — BP 126/78 | HR 65 | Temp 98.3°F | Ht 68.0 in | Wt 205.0 lb

## 2018-08-17 DIAGNOSIS — M79642 Pain in left hand: Secondary | ICD-10-CM | POA: Diagnosis not present

## 2018-08-17 DIAGNOSIS — M79641 Pain in right hand: Secondary | ICD-10-CM | POA: Diagnosis not present

## 2018-08-17 DIAGNOSIS — E1165 Type 2 diabetes mellitus with hyperglycemia: Secondary | ICD-10-CM

## 2018-08-17 DIAGNOSIS — M19041 Primary osteoarthritis, right hand: Secondary | ICD-10-CM | POA: Diagnosis not present

## 2018-08-17 DIAGNOSIS — Z23 Encounter for immunization: Secondary | ICD-10-CM | POA: Diagnosis not present

## 2018-08-17 DIAGNOSIS — M7989 Other specified soft tissue disorders: Secondary | ICD-10-CM | POA: Diagnosis not present

## 2018-08-17 DIAGNOSIS — R1032 Left lower quadrant pain: Secondary | ICD-10-CM | POA: Insufficient documentation

## 2018-08-17 LAB — C-REACTIVE PROTEIN: CRP: 0.4 mg/dL — AB (ref 0.5–20.0)

## 2018-08-17 LAB — SEDIMENTATION RATE: SED RATE: 37 mm/h — AB (ref 0–20)

## 2018-08-17 MED ORDER — METHYLPREDNISOLONE ACETATE 80 MG/ML IJ SUSP
80.0000 mg | Freq: Once | INTRAMUSCULAR | Status: AC
  Administered 2018-08-17: 80 mg via INTRAMUSCULAR

## 2018-08-17 MED ORDER — MELOXICAM 7.5 MG PO TABS: ORAL_TABLET | ORAL | 3 refills | Status: DC

## 2018-08-17 MED ORDER — COLCHICINE 0.6 MG PO TABS: 0.6000 mg | ORAL_TABLET | Freq: Every day | ORAL | 3 refills | Status: DC

## 2018-08-17 MED ORDER — PREDNISONE 10 MG PO TABS: ORAL_TABLET | ORAL | 0 refills | Status: DC

## 2018-08-17 NOTE — Patient Instructions (Addendum)
You had the steroid shot today  Please take all new medication as prescribed - the short course of prednisone  Please continue all other medications as before, and refills have been done if requested for the colchicine and the meloxicam that you ran out of  Please have the pharmacy call with any other refills you may need.  Please keep your appointments with your specialists as you may have planned  Please go to the XRAY Department in the Basement (go straight as you get off the elevator) for the x-ray testing - xrays for the hands to check for rheumatoid arthritis changes  Please go to the LAB in the Basement (turn left off the elevator) for the tests to be done today - just the rheumatoid blood testing today  You will be contacted by phone if any changes need to be made immediately.  Otherwise, you will receive a letter about your results with an explanation, but please check with MyChart first.  Please remember to sign up for MyChart if you have not done so, as this will be important to you in the future with finding out test results, communicating by private email, and scheduling acute appointments online when needed.

## 2018-08-17 NOTE — Assessment & Plan Note (Signed)
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

## 2018-08-17 NOTE — Progress Notes (Signed)
Subjective:    Patient ID: Travis Carey, male    DOB: 1941-01-28, 77 y.o.   MRN: [id number]  HPI  Here after has not been taking the colchicine lately or the mobic - ran out, with c/o pain again to hands right > left with severe pain on right, mod on left assoc with swelling without fever or trauma for 3-4 days; also c/o mild to mod left groin pain without swelling, dull and sharp, without radiation worse to first stand up but better after a few steps, Denies worsening reflux, abd pain, dysphagia, n/v, bowel change or blood.  Denies urinary symptoms such as dysuria, frequency, urgency, flank pain, hematuria or n/v, fever, chills.  Pt denies chest pain, increased sob or doe, wheezing, orthopnea, PND, increased LE swelling, palpitations, dizziness or syncope. Past Medical History:  Diagnosis Date  . Allergic rhinitis, cause unspecified 01/31/2013  . ANEMIA-NOS 01/28/2010  . BPH (benign prostatic hypertrophy) 04/25/2013  . CAD 02/17/2010  . CAROTID BRUIT 11/18/2009  . Carpal tunnel syndrome 05/20/2008  . Cervical radiculitis 04/17/2012  . CHEST PAIN 09/26/2009  . DEPRESSION 06/12/2007  . DYSPNEA 07/28/2010  . ERECTILE DYSFUNCTION 02/24/2009  . GERD 06/09/2007  . GERD (gastroesophageal reflux disease)   . GLUCOSE INTOLERANCE 01/28/2010  . Gout 2017   Dr.Wendy Mikles  . HEMORRHOIDS 06/09/2007  . History of colonic polyps 08/07/2015  . HYPERLIPIDEMIA 02/07/2008  . HYPERTENSION 06/09/2007  . HYPOKALEMIA 01/28/2010  . Impaired glucose tolerance 01/23/2011  . LOW BACK PAIN 06/12/2007  . MYOCARDIAL PERFUSION SCAN, WITH STRESS TEST, ABNORMAL 10/08/2009  . PARESTHESIA 04/25/2008  . SHOULDER PAIN, LEFT 04/25/2008  . URI 08/19/2008  . VITAMIN B12 DEFICIENCY 02/09/2010   Past Surgical History:  Procedure Laterality Date  . COLONOSCOPY    . CORONARY STENT PLACEMENT     from right radial    reports that he has never smoked. He has never used smokeless tobacco. He reports that he does not drink alcohol or use drugs. family  history includes Alcohol abuse in his brother and sister; Coronary artery disease in his other; Heart attack in his other; Heart disease in his father and mother; Hyperlipidemia in his father and mother; Hypertension in his father, mother, and other; Stroke in his other. Allergies  Allergen Reactions  . Diclofenac Sodium     REACTION: maks pt drowsey   Current Outpatient Medications on File Prior to Visit  Medication Sig Dispense Refill  . amLODipine (NORVASC) 5 MG tablet Take 1 tablet (5 mg total) by mouth daily. 90 tablet 1  . aspirin EC 81 MG tablet Take 81 mg by mouth daily.    . carvedilol (COREG) 25 MG tablet Take 1 tablet (25 mg total) by mouth 2 (two) times daily. 180 tablet 1  . clopidogrel (PLAVIX) 75 MG tablet TAKE 1 TABLET BY MOUTH ONCE DAILY 90 tablet 1  . dexlansoprazole (DEXILANT) 60 MG capsule Take 1 capsule (60 mg total) by mouth daily. 90 capsule 3  . doxycycline (VIBRA-TABS) 100 MG tablet Take 1 tablet (100 mg total) by mouth 2 (two) times daily. 20 tablet 0  . fexofenadine (ALLEGRA) 180 MG tablet Take 1 tablet (180 mg total) by mouth daily. As needed (Patient taking differently: Take 180 mg by mouth daily as needed for allergies. ) 90 tablet 3  . fluticasone (FLONASE) 50 MCG/ACT nasal spray USE TWO SPRAYS IN EACH NOSTRIL ONCE DAILY 48 g 5  . gabapentin (NEURONTIN) 100 MG capsule Take 2 capsules (200 mg total) by  mouth 3 (three) times daily. 180 capsule 5  . HYDROcodone-acetaminophen (NORCO/VICODIN) 5-325 MG tablet Take 1 tablet by mouth every 6 (six) hours as needed for moderate pain. 30 tablet 0  . lisinopril-hydrochlorothiazide (ZESTORETIC) 20-12.5 MG tablet Take 1 tablet by mouth daily. 90 tablet 3  . methylPREDNISolone (MEDROL DOSEPAK) 4 MG TBPK tablet Take Tapered dose as directed 21 tablet 0  . pantoprazole (PROTONIX) 40 MG tablet Take 1 tablet (40 mg total) by mouth daily. 90 tablet 3  . potassium chloride (K-DUR) 10 MEQ tablet Take 2 tablets (20 mEq total) by mouth  daily. 180 tablet 1  . rosuvastatin (CRESTOR) 10 MG tablet TAKE 1 TABLET BY MOUTH ONCE DAILY 90 tablet 1  . tamsulosin (FLOMAX) 0.4 MG CAPS capsule TAKE 1 CAPSULE BY MOUTH ONCE DAILY 90 capsule 1   No current facility-administered medications on file prior to visit.    Review of Systems  Constitutional: Negative for other unusual diaphoresis or sweats HENT: Negative for ear discharge or swelling Eyes: Negative for other worsening visual disturbances Respiratory: Negative for stridor or other swelling  Gastrointestinal: Negative for worsening distension or other blood Genitourinary: Negative for retention or other urinary change Musculoskeletal: Negative for other MSK pain or swelling Skin: Negative for color change or other new lesions Neurological: Negative for worsening tremors and other numbness  Psychiatric/Behavioral: Negative for worsening agitation or other fatigue All other system neg per  pt    Objective:   Physical Exam BP 126/78   Pulse 65   Temp 98.3 F (36.8 C) (Oral)   Ht 5\' 8"  (1.727 m)   Wt 205 lb (93 kg)   SpO2 93%   BMI 31.17 kg/m  VS noted,  Constitutional: Pt appears in NAD HENT: Head: NCAT.  Right Ear: External ear normal.  Left Ear: External ear normal.  Eyes: . Pupils are equal, round, and reactive to light. Conjunctivae and EOM are normal Nose: without d/c or deformity Neck: Neck supple. Gross normal ROM Cardiovascular: Normal rate and regular rhythm.   Pulmonary/Chest: Effort normal and breath sounds without rales or wheezing.  Abd:  Soft, NT, ND, + BS, no organomegalylk no inguinal swelling or mass Neurological: Pt is alert. At baseline orientation, motor grossly intact Skin: Skin is warm. No rashes, other new lesions, no LE edema Psychiatric: Pt behavior is normal without agitation  Bilat MCP swelling, tender right > left No other exam findings  Lab Results  Component Value Date   WBC 6.0 11/14/2017   HGB 12.7 (L) 11/14/2017   HCT 37.9 (L)  11/14/2017   PLT 218.0 11/14/2017   GLUCOSE 88 11/14/2017   CHOL 144 11/14/2017   TRIG 325.0 (H) 11/14/2017   HDL 36.50 (L) 11/14/2017   LDLDIRECT 63.0 11/14/2017   LDLCALC 64 04/08/2015   ALT 13 11/14/2017   AST 17 11/14/2017   NA 144 11/14/2017   K 3.6 11/14/2017   CL 103 11/14/2017   CREATININE 1.11 11/14/2017   BUN 17 11/14/2017   CO2 35 (H) 11/14/2017   TSH 2.69 11/14/2017   PSA 0.54 11/14/2017   INR 1.0 ratio 10/23/2009   HGBA1C 5.8 (A) 06/05/2018   HGBA1C 0 06/05/2018   HGBA1C 0 (A) 06/05/2018   HGBA1C 0.0 06/05/2018   MICROALBUR 1.2 11/14/2017       Assessment & Plan:

## 2018-08-17 NOTE — Assessment & Plan Note (Signed)
Likely recurreng gout episode, cant r/o RA, for depomedrol IM 80, predpadc asd, restart colchicine and mobic pn, for bilat hand xray and RA labs,  to f/u any worsening symptoms or concerns

## 2018-08-17 NOTE — Assessment & Plan Note (Signed)
stable overall by history and exam, recent data reviewed with pt, and pt to continue medical treatment as before,  to f/u any worsening symptoms or concerns  

## 2018-08-18 ENCOUNTER — Encounter: Payer: Self-pay | Admitting: Internal Medicine

## 2018-08-18 LAB — CYCLIC CITRUL PEPTIDE ANTIBODY, IGG

## 2018-08-21 ENCOUNTER — Emergency Department
Admission: EM | Admit: 2018-08-21 | Discharge: 2018-08-21 | Disposition: A | Discharge status: Home / Self Care | Payer: Medicare HMO | Attending: Emergency Medicine | Admitting: Emergency Medicine

## 2018-08-21 ENCOUNTER — Encounter: Payer: Self-pay | Admitting: Emergency Medicine

## 2018-08-21 ENCOUNTER — Other Ambulatory Visit: Payer: Self-pay

## 2018-08-21 ENCOUNTER — Emergency Department: Payer: Medicare HMO

## 2018-08-21 DIAGNOSIS — E119 Type 2 diabetes mellitus without complications: Secondary | ICD-10-CM | POA: Diagnosis not present

## 2018-08-21 DIAGNOSIS — S76212A Strain of adductor muscle, fascia and tendon of left thigh, initial encounter: Secondary | ICD-10-CM | POA: Diagnosis not present

## 2018-08-21 DIAGNOSIS — S76812A Strain of other specified muscles, fascia and tendons at thigh level, left thigh, initial encounter: Secondary | ICD-10-CM | POA: Diagnosis not present

## 2018-08-21 DIAGNOSIS — Z7982 Long term (current) use of aspirin: Secondary | ICD-10-CM | POA: Diagnosis not present

## 2018-08-21 DIAGNOSIS — Y998 Other external cause status: Secondary | ICD-10-CM | POA: Insufficient documentation

## 2018-08-21 DIAGNOSIS — Z7902 Long term (current) use of antithrombotics/antiplatelets: Secondary | ICD-10-CM | POA: Diagnosis not present

## 2018-08-21 DIAGNOSIS — I10 Essential (primary) hypertension: Secondary | ICD-10-CM | POA: Insufficient documentation

## 2018-08-21 DIAGNOSIS — R1030 Lower abdominal pain, unspecified: Secondary | ICD-10-CM | POA: Diagnosis not present

## 2018-08-21 DIAGNOSIS — X509XXA Other and unspecified overexertion or strenuous movements or postures, initial encounter: Secondary | ICD-10-CM | POA: Diagnosis not present

## 2018-08-21 DIAGNOSIS — J449 Chronic obstructive pulmonary disease, unspecified: Secondary | ICD-10-CM | POA: Insufficient documentation

## 2018-08-21 DIAGNOSIS — Z79899 Other long term (current) drug therapy: Secondary | ICD-10-CM | POA: Insufficient documentation

## 2018-08-21 DIAGNOSIS — S76801A Unspecified injury of other specified muscles, fascia and tendons at thigh level, right thigh, initial encounter: Secondary | ICD-10-CM | POA: Diagnosis present

## 2018-08-21 DIAGNOSIS — Z955 Presence of coronary angioplasty implant and graft: Secondary | ICD-10-CM | POA: Insufficient documentation

## 2018-08-21 DIAGNOSIS — Y929 Unspecified place or not applicable: Secondary | ICD-10-CM | POA: Diagnosis not present

## 2018-08-21 DIAGNOSIS — Y9389 Activity, other specified: Secondary | ICD-10-CM | POA: Insufficient documentation

## 2018-08-21 DIAGNOSIS — I2510 Atherosclerotic heart disease of native coronary artery without angina pectoris: Secondary | ICD-10-CM | POA: Diagnosis not present

## 2018-08-21 DIAGNOSIS — F329 Major depressive disorder, single episode, unspecified: Secondary | ICD-10-CM | POA: Diagnosis not present

## 2018-08-21 LAB — URINALYSIS, COMPLETE (UACMP) WITH MICROSCOPIC
Bacteria, UA: NONE SEEN
Bilirubin Urine: NEGATIVE
Glucose, UA: NEGATIVE mg/dL
Hgb urine dipstick: NEGATIVE
Ketones, ur: NEGATIVE mg/dL
Nitrite: NEGATIVE
PH: 5 (ref 5.0–8.0)
Protein, ur: NEGATIVE mg/dL
SPECIFIC GRAVITY, URINE: 1.014 (ref 1.005–1.030)

## 2018-08-21 MED ORDER — SULFAMETHOXAZOLE-TRIMETHOPRIM 800-160 MG PO TABS: 1.0000 | ORAL_TABLET | Freq: Two times a day (BID) | ORAL | 0 refills | Status: DC

## 2018-08-21 MED ORDER — TRAMADOL-ACETAMINOPHEN 37.5-325 MG PO TABS: 1.0000 | ORAL_TABLET | Freq: Four times a day (QID) | ORAL | 0 refills | Status: DC | PRN

## 2018-08-21 NOTE — ED Notes (Signed)
See triage note  Presents with pain to groin area  States pain started last week and is mainly to the left side  States pain is worse in the mornings and with ambulation  States he may have had subjective fever about 5 days ago  Afebrile at present  States he went to see his PCP and was told it was gout

## 2018-08-21 NOTE — ED Provider Notes (Addendum)
[facility] Emergency Department Provider Note   ____________________________________________   First MD Initiated Contact with Patient 08/21/18 [phone]     (approximate)  I have reviewed the triage vital signs and the nursing notes.   HISTORY  Chief Complaint Groin Pain    HPI Travis Carey is a 77 y.o. male patient presents with left inguinal pain for 5 days.  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  Patient state pain is worse in the morning and improves a little throughout the day.  Patient did pain increased with ambulation and squatting.  Patient denies fever, dysuria, hematuria, no flank pain.  Patient states that his doctor 3 days ago and was given medicine for suspected "gout".  No relief with medications.  Patient not during the name of the medication stating it was a mild pain medicine.  Patient rates pain as 8/10.  Patient described the pain is "achy".  No other palliative measures for complaint.  Past Medical History:  Diagnosis Date  . Allergic rhinitis, cause unspecified 01/31/2013  . ANEMIA-NOS 01/28/2010  . BPH (benign prostatic hypertrophy) 04/25/2013  . CAD 02/17/2010  . CAROTID BRUIT 11/18/2009  . Carpal tunnel syndrome 05/20/2008  . Cervical radiculitis 04/17/2012  . CHEST PAIN 09/26/2009  . DEPRESSION 06/12/2007  . DYSPNEA 07/28/2010  . ERECTILE DYSFUNCTION 02/24/2009  . GERD 06/09/2007  . GERD (gastroesophageal reflux disease)   . GLUCOSE INTOLERANCE 01/28/2010  . Gout 2017   Dr.John  . HEMORRHOIDS 06/09/2007  . History of colonic polyps 08/07/2015  . HYPERLIPIDEMIA 02/07/2008  . HYPERTENSION 06/09/2007  . HYPOKALEMIA 01/28/2010  . Impaired glucose tolerance 01/23/2011  . LOW BACK PAIN 06/12/2007  . MYOCARDIAL PERFUSION SCAN, WITH STRESS TEST, ABNORMAL 10/08/2009  . PARESTHESIA 04/25/2008  . SHOULDER PAIN, LEFT 04/25/2008  . URI 08/19/2008  . VITAMIN B12 DEFICIENCY  02/09/2010    Patient Active Problem List   Diagnosis Date Noted  . Bilateral hand pain 08/17/2018  . Left groin pain 08/17/2018  . Right lumbar radiculopathy 02/02/2018  . Right Achilles tendinitis 06/08/2017  . Swelling of extremity 06/08/2017  . Achilles tendon disorder, right 05/03/2017  . Acute gouty arthritis 09/16/2016  . Epididymo-orchitis 08/05/2016  . Vertigo 05/05/2016  . History of colonic polyps 08/07/2015  . CVA (cerebral infarction) 04/07/2015  . Rhinitis, allergic 03/29/2015  . Diabetes type 2, uncontrolled ([place]) 03/29/2015  . Right arm pain 11/27/2014  . Epididymal cyst 06/12/2014  . Left wrist pain 11/01/2013  . Right wrist pain 11/01/2013  . Nocturia 11/01/2013  . Encounter for well adult exam with abnormal findings 11/01/2013  . Obese 09/12/2013  . Benign prostatic hyperplasia 04/25/2013  . Knee pain, bilateral 04/25/2013  . Chest pain 01/31/2013  . Eustachian tube dysfunction 01/31/2013  . Cough 01/31/2013  . Allergic rhinitis 01/31/2013  . Cervical radiculitis 04/17/2012  . Cervical disc disease 04/17/2012  . Paresthesia of arm 12/08/2011  . Hypertension 12/08/2011  . Myalgia 11/05/2011  . B12 deficiency 11/05/2011  . Fatigue 07/27/2011  . COPD (chronic obstructive pulmonary disease) ([place]) 01/26/2011  . Left knee pain 01/26/2011  . Coronary atherosclerosis 02/17/2010  . VITAMIN B12 DEFICIENCY 02/09/2010  . Hypokalemia 01/28/2010  . ANEMIA-NOS 01/28/2010  . CAROTID BRUIT 11/18/2009  . ERECTILE DYSFUNCTION 02/24/2009  . Carpal tunnel syndrome 05/20/2008  . PARESTHESIA 04/25/2008  . Hyperlipidemia 02/07/2008  . DEPRESSION 06/12/2007  . LOW BACK PAIN 06/12/2007  . Gastroesophageal  reflux disease without esophagitis 06/09/2007    Past Surgical History:  Procedure Laterality Date  . COLONOSCOPY    . CORONARY STENT PLACEMENT     from right radial    Prior to Admission medications   Medication Sig Start Date End Date Taking? Authorizing  Provider  amLODipine (NORVASC) 5 MG tablet Take 1 tablet (5 mg total) by mouth daily. 02/09/18   Biagio Borg, MD  aspirin EC 81 MG tablet Take 81 mg by mouth daily.    [provider]  carvedilol (COREG) 25 MG tablet Take 1 tablet (25 mg total) by mouth 2 (two) times daily. 02/09/18 02/09/19  Biagio Borg, MD  clopidogrel (PLAVIX) 75 MG tablet TAKE 1 TABLET BY MOUTH ONCE DAILY 08/03/18   Biagio Borg, MD  colchicine 0.6 MG tablet Take 1 tablet (0.6 mg total) by mouth daily. 08/17/18   Biagio Borg, MD  dexlansoprazole (DEXILANT) 60 MG capsule Take 1 capsule (60 mg total) by mouth daily. 01/31/13   Biagio Borg, MD  doxycycline (VIBRA-TABS) 100 MG tablet Take 1 tablet (100 mg total) by mouth 2 (two) times daily. 06/05/18   Biagio Borg, MD  fexofenadine (ALLEGRA) 180 MG tablet Take 1 tablet (180 mg total) by mouth daily. As needed Patient taking differently: Take 180 mg by mouth daily as needed for allergies.  06/12/14   Biagio Borg, MD  fluticasone [facility]) 50 MCG/ACT nasal spray USE TWO SPRAYS IN [facility] NOSTRIL ONCE DAILY 08/06/16   Biagio Borg, MD  gabapentin (NEURONTIN) 100 MG capsule Take 2 capsules (200 mg total) by mouth 3 (three) times daily. 02/02/18   Biagio Borg, MD  HYDROcodone-acetaminophen (NORCO/VICODIN) 5-325 MG tablet Take 1 tablet by mouth every 6 (six) hours as needed for moderate pain. 02/02/18   Biagio Borg, MD  lisinopril-hydrochlorothiazide (ZESTORETIC) 20-12.5 MG tablet Take 1 tablet by mouth daily. 11/14/17   Biagio Borg, MD  meloxicam (MOBIC) 7.5 MG tablet TAKE 1 TO 2 TABLETS ONE TIME DAILY AS NEEDED FOR PAIN 08/17/18   Biagio Borg, MD  methylPREDNISolone (MEDROL DOSEPAK) 4 MG TBPK tablet Take Tapered dose as directed 07/22/18   Sable Feil, PA-C  pantoprazole (PROTONIX) 40 MG tablet Take 1 tablet (40 mg total) by mouth daily. 11/14/17 11/14/18  Biagio Borg, MD  potassium chloride (K-DUR) 10 MEQ tablet Take 2 tablets (20 mEq total) by mouth daily. 02/09/18   Biagio Borg, MD  predniSONE (DELTASONE) 10 MG tablet 3 tabs by mouth per day for 3 days,2tabs per day for 3 days,1tab per day for 3 days 08/17/18   Biagio Borg, MD  rosuvastatin (CRESTOR) 10 MG tablet TAKE 1 TABLET BY MOUTH ONCE DAILY 06/28/18   Biagio Borg, MD  sulfamethoxazole-trimethoprim (BACTRIM DS,SEPTRA DS) 800-160 MG tablet Take 1 tablet by mouth 2 (two) times daily. 08/21/18   Sable Feil, PA-C  tamsulosin (FLOMAX) 0.4 MG CAPS capsule TAKE 1 CAPSULE BY MOUTH ONCE DAILY 05/23/18   Biagio Borg, MD  traMADol-acetaminophen (ULTRACET) 37.5-325 MG tablet Take 1 tablet by mouth every 6 (six) hours as needed. 08/21/18   Sable Feil, PA-C    Allergies Diclofenac sodium  Family History  Problem Relation Age of Onset  . Alcohol abuse Sister        ETOH dependence  . Alcohol abuse Brother        ETOH  dependence  . Heart attack Other   . Stroke  Other   . Hypertension Other   . Coronary artery disease Other   . Hyperlipidemia Mother   . Heart disease Mother   . Hypertension Mother   . Hyperlipidemia Father   . Heart disease Father   . Hypertension Father     Social History Social History   Tobacco Use  . Smoking status: Never Smoker  . Smokeless tobacco: Never Used  Substance Use Topics  . Alcohol use: No  . Drug use: No    Review of Systems  Constitutional: No fever/chills Eyes: No visual changes. ENT: No sore throat. Cardiovascular: Denies chest pain. Respiratory: Denies shortness of breath. Gastrointestinal: No abdominal pain.  No nausea, no vomiting.  No diarrhea.  No constipation. Genitourinary: Negative for dysuria. Musculoskeletal: Left inguinal pain. Skin: Negative for rash. Neurological: Negative for headaches, focal weakness or numbness. Psychiatric:Depression Endocrine:Diabetes, hypokalemia, hyperlipidemia, hypertension, Allergic/Immunilogical: NSAIDs  ____________________________________________   PHYSICAL EXAM:  VITAL SIGNS: ED Triage  Vitals  Enc Vitals Group     BP 08/21/18 0923 (!) 175/70     Pulse Rate 08/21/18 0923 68     Resp 08/21/18 0923 18     Temp 08/21/18 0923 (!) 97.5 F (36.4 C)     Temp Source 08/21/18 0923 Oral     SpO2 08/21/18 0923 99 %     Weight 08/21/18 0933 204 lb 15.7 oz (93 kg)     Height 08/21/18 0933 5\' 8"  (1.727 m)     Head Circumference --      Peak Flow --      Pain Score 08/21/18 0933 8     Pain Loc --      Pain Edu? --      Excl. in [place]? --     Constitutional: Alert and oriented. Well appearing and in no acute distress. Cardiovascular: Normal rate, regular rhythm. Grossly normal heart sounds.  Good peripheral circulation. Respiratory: Normal respiratory effort.  No retractions. Lungs CTAB. Gastrointestinal: Soft and nontender. No distention. No abdominal bruits. No CVA tenderness. Musculoskeletal: No lower extremity tenderness nor edema.  No joint effusions. Neurologic:  Normal speech and language. No gross focal neurologic deficits are appreciated. No gait instability. Skin:  Skin is warm, dry and intact. No rash noted. Psychiatric: Mood and affect are normal. Speech and behavior are normal.  ____________________________________________   LABS (all labs ordered are listed, but only abnormal results are displayed)  Labs Reviewed  URINALYSIS, COMPLETE (UACMP) WITH MICROSCOPIC - Abnormal; Notable for the following components:      Result Value   Color, Urine YELLOW (*)    APPearance CLEAR (*)    Leukocytes, UA LARGE (*)    All other components within normal limits   ____________________________________________  EKG   ____________________________________________  RADIOLOGY  ED MD interpretation:    Official radiology report(s): [place] Extrem Low Left Ltd  Result Date: 08/21/2018 CLINICAL DATA:  Left groin pain for several days EXAM: ULTRASOUND LEFT LOWER EXTREMITY LIMITED TECHNIQUE: Ultrasound examination of the lower extremity soft tissues was performed in the area of  clinical concern. COMPARISON:  None. FINDINGS: Scanning in the area of clinical concern in the left inguinal region reveals no evidence of soft tissue hernia or other focal abnormality. IMPRESSION: No acute abnormality noted. Electronically Signed   By: Inez Catalina M.D.   On: 08/21/2018 10:43    ____________________________________________   PROCEDURES  Procedure(s) performed:   Procedures  Critical Care performed:   ____________________________________________   INITIAL IMPRESSION / ASSESSMENT AND PLAN /  ED COURSE  As part of my medical decision making, I reviewed the following data within the [place]    Patient presents with 5 days of left groin pain.  Discussed ultrasound findings which was negative for hernia.  Patient urine had elevated white blood cells but no bacteria.  Patient given discharge care instruction advised take medication as directed.  Patient advised to decrease his physical activities and follow-up PCP if complaints persist.      ____________________________________________   FINAL CLINICAL IMPRESSION(S) / ED DIAGNOSES  Final diagnoses:  Inguinal strain, left, initial encounter     ED Discharge Orders         Ordered    traMADol-acetaminophen (ULTRACET) 37.5-325 MG tablet  Every 6 hours PRN     08/21/18 1050    sulfamethoxazole-trimethoprim (BACTRIM DS,SEPTRA DS) 800-160 MG tablet  2 times daily     08/21/18 1056           Note:  This document was prepared using Dragon voice recognition software and may include unintentional dictation errors.    Sable Feil, PA-C 08/21/18 1054    Sable Feil, PA-C 08/21/18 1057    Lisa Roca, MD 08/21/18 [phone]

## 2018-08-21 NOTE — ED Triage Notes (Signed)
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  Has had pain X 5 days.  Hurts worse in AM and gets better throughout day.  Pain when ambulating.  No fevers.  Reports his doctor saw him but did not do a xray and he thinks he needs one.  Unlabored. Denies testicle pain.

## 2018-08-21 NOTE — Discharge Instructions (Signed)
Follow discharge care instruction substituting heat instead of ice.  Discontinue meloxicam and take Ultracet as directed.  Follow-up PCP if no improvement in 1 week.  Be advised medication may cause drowsiness.  Do not drive or operate equipment while taking this medication.

## 2018-09-04 ENCOUNTER — Other Ambulatory Visit: Payer: Self-pay | Admitting: Internal Medicine

## 2018-11-04 ENCOUNTER — Other Ambulatory Visit: Payer: Self-pay | Admitting: Internal Medicine

## 2018-11-05 ENCOUNTER — Other Ambulatory Visit: Payer: Self-pay | Admitting: Internal Medicine

## 2018-12-07 ENCOUNTER — Other Ambulatory Visit (INDEPENDENT_AMBULATORY_CARE_PROVIDER_SITE_OTHER): Payer: Medicare HMO

## 2018-12-07 ENCOUNTER — Encounter: Payer: Self-pay | Admitting: Internal Medicine

## 2018-12-07 ENCOUNTER — Ambulatory Visit (INDEPENDENT_AMBULATORY_CARE_PROVIDER_SITE_OTHER): Payer: Medicare HMO | Admitting: Internal Medicine

## 2018-12-07 VITALS — BP 144/84 | HR 68 | Temp 97.7°F | Ht 68.0 in | Wt 203.0 lb

## 2018-12-07 DIAGNOSIS — M545 Low back pain: Secondary | ICD-10-CM

## 2018-12-07 DIAGNOSIS — G8929 Other chronic pain: Secondary | ICD-10-CM

## 2018-12-07 DIAGNOSIS — E1165 Type 2 diabetes mellitus with hyperglycemia: Secondary | ICD-10-CM

## 2018-12-07 DIAGNOSIS — Z23 Encounter for immunization: Secondary | ICD-10-CM

## 2018-12-07 DIAGNOSIS — I10 Essential (primary) hypertension: Secondary | ICD-10-CM | POA: Diagnosis not present

## 2018-12-07 DIAGNOSIS — J438 Other emphysema: Secondary | ICD-10-CM | POA: Diagnosis not present

## 2018-12-07 DIAGNOSIS — M48061 Spinal stenosis, lumbar region without neurogenic claudication: Secondary | ICD-10-CM | POA: Insufficient documentation

## 2018-12-07 DIAGNOSIS — M48062 Spinal stenosis, lumbar region with neurogenic claudication: Secondary | ICD-10-CM | POA: Diagnosis not present

## 2018-12-07 DIAGNOSIS — Z0001 Encounter for general adult medical examination with abnormal findings: Secondary | ICD-10-CM

## 2018-12-07 LAB — CBC WITH DIFFERENTIAL/PLATELET
Basophils Absolute: 0 10*3/uL (ref 0.0–0.1)
Basophils Relative: 0.6 % (ref 0.0–3.0)
EOS PCT: 2.4 % (ref 0.0–5.0)
Eosinophils Absolute: 0.1 10*3/uL (ref 0.0–0.7)
HCT: 39.5 % (ref 39.0–52.0)
Hemoglobin: 13.5 g/dL (ref 13.0–17.0)
Lymphocytes Relative: 45.8 % (ref 12.0–46.0)
Lymphs Abs: 2.5 10*3/uL (ref 0.7–4.0)
MCHC: 34.2 g/dL (ref 30.0–36.0)
MCV: 89.3 fl (ref 78.0–100.0)
Monocytes Absolute: 0.5 10*3/uL (ref 0.1–1.0)
Monocytes Relative: 8.3 % (ref 3.0–12.0)
Neutro Abs: 2.3 10*3/uL (ref 1.4–7.7)
Neutrophils Relative %: 42.9 % — ABNORMAL LOW (ref 43.0–77.0)
Platelets: 202 10*3/uL (ref 150.0–400.0)
RBC: 4.43 Mil/uL (ref 4.22–5.81)
RDW: 14.3 % (ref 11.5–15.5)
WBC: 5.5 10*3/uL (ref 4.0–10.5)

## 2018-12-07 LAB — TSH: TSH: 2.05 u[IU]/mL (ref 0.35–4.50)

## 2018-12-07 LAB — URINALYSIS, ROUTINE W REFLEX MICROSCOPIC
Bilirubin Urine: NEGATIVE
Hgb urine dipstick: NEGATIVE
Ketones, ur: NEGATIVE
Leukocytes,Ua: NEGATIVE
Nitrite: NEGATIVE
PH: 7 (ref 5.0–8.0)
Specific Gravity, Urine: 1.02 (ref 1.000–1.030)
Total Protein, Urine: NEGATIVE
Urine Glucose: NEGATIVE
Urobilinogen, UA: 0.2 (ref 0.0–1.0)

## 2018-12-07 LAB — LIPID PANEL
Cholesterol: 141 mg/dL (ref 0–200)
HDL: 35.7 mg/dL — AB (ref >39.00)
LDL Cholesterol: 71 mg/dL (ref 0–99)
NonHDL: 105.17
Total CHOL/HDL Ratio: 4
Triglycerides: 171 mg/dL — ABNORMAL HIGH (ref 0.0–149.0)
VLDL: 34.2 mg/dL (ref 0.0–40.0)

## 2018-12-07 LAB — MICROALBUMIN / CREATININE URINE RATIO
Creatinine,U: 113 mg/dL
Microalb Creat Ratio: 2.2 mg/g (ref 0.0–30.0)
Microalb, Ur: 2.5 mg/dL — ABNORMAL HIGH (ref 0.0–1.9)

## 2018-12-07 LAB — HEMOGLOBIN A1C: Hgb A1c MFr Bld: 6.2 % (ref 4.6–6.5)

## 2018-12-07 LAB — BASIC METABOLIC PANEL
BUN: 12 mg/dL (ref 6–23)
CALCIUM: 9.8 mg/dL (ref 8.4–10.5)
CO2: 31 mEq/L (ref 19–32)
Chloride: 104 mEq/L (ref 96–112)
Creatinine, Ser: 1.02 mg/dL (ref 0.40–1.50)
GFR: 85.7 mL/min (ref >60.00)
Glucose, Bld: 91 mg/dL (ref 70–99)
Potassium: 3.5 mEq/L (ref 3.5–5.1)
SODIUM: 144 meq/L (ref 135–145)

## 2018-12-07 LAB — HEPATIC FUNCTION PANEL
ALT: 16 U/L (ref 0–53)
AST: 20 U/L (ref 0–37)
Albumin: 4.3 g/dL (ref 3.5–5.2)
Alkaline Phosphatase: 63 U/L (ref 39–117)
Bilirubin, Direct: 0.1 mg/dL (ref 0.0–0.3)
Total Bilirubin: 0.5 mg/dL (ref 0.2–1.2)
Total Protein: 7.1 g/dL (ref 6.0–8.3)

## 2018-12-07 LAB — PSA: PSA: 0.48 ng/mL (ref 0.10–4.00)

## 2018-12-07 MED ORDER — KETOROLAC TROMETHAMINE 30 MG/ML IJ SOLN
30.0000 mg | Freq: Once | INTRAMUSCULAR | Status: AC
  Administered 2018-12-07: 30 mg via INTRAMUSCULAR

## 2018-12-07 NOTE — Assessment & Plan Note (Signed)

## 2018-12-07 NOTE — Assessment & Plan Note (Signed)
Persistent chronic, for toradol 30 mg IM, also refer new NS for second opinion, o/w cont same tx

## 2018-12-07 NOTE — Assessment & Plan Note (Signed)
stable overall by history and exam, recent data reviewed with pt, and pt to continue medical treatment as before,  to f/u any worsening symptoms or concerns  

## 2018-12-07 NOTE — Patient Instructions (Addendum)
You had the Tdap tetanus shot today  You had the pain shot today (toradol)  You will be contacted regarding the referral for: Neurosurgury (second opinion), ophthalmology, and colonoscopy  Please continue all other medications as before, and refills have been done if requested.  Please have the pharmacy call with any other refills you may need.  Please continue your efforts at being more active, low cholesterol diet, and weight control.  You are otherwise up to date with prevention measures today.  Please keep your appointments with your specialists as you may have planned  Please go to the LAB in the Basement (turn left off the elevator) for the tests to be done today  You will be contacted by phone if any changes need to be made immediately.  Otherwise, you will receive a letter about your results with an explanation, but please check with MyChart first.  Please remember to sign up for MyChart if you have not done so, as this will be important to you in the future with finding out test results, communicating by private email, and scheduling acute appointments online when needed.  Please return in 6 months, or sooner if needed, with Lab testing done 3-5 days before

## 2018-12-07 NOTE — Progress Notes (Signed)
Subjective:    Patient ID: Travis Carey, male    DOB: Jun 11, 1941, 78 y.o.   MRN: [id number]  HPI:  Here for wellness and f/u;  Overall doing ok;  Pt denies Chest pain, worsening SOB, DOE, wheezing, orthopnea, PND, worsening LE edema, dizziness or syncope.  Pt denies neurological change such as new headache, facial or extremity weakness.  Pt denies polydipsia, polyuria, or low sugar symptoms. Pt states overall good compliance with treatment and medications, good tolerability, and has been trying to follow appropriate diet.  Pt denies worsening depressive symptoms, suicidal ideation or panic. No fever, night sweats, wt loss, loss of appetite, or other constitutional symptoms.  Pt states good ability with ADL's, has low fall risk, home safety reviewed and adequate, no other significant changes in hearing or vision, and only occasionally active with exercise. Also c/o persistently recurrent numbness to both legs and off balance after walking while shopping, usually recurring after about 20 min.  Has hx of LS spine MRI 2019 with Lumbar spinal stenosis.  Asks for cardiolgy referral with palpitations more frequent.   Past Medical History:  Diagnosis Date  . Allergic rhinitis, cause unspecified 01/31/2013  . ANEMIA-NOS 01/28/2010  . BPH (benign prostatic hypertrophy) 04/25/2013  . CAD 02/17/2010  . CAROTID BRUIT 11/18/2009  . Carpal tunnel syndrome 05/20/2008  . Cervical radiculitis 04/17/2012  . CHEST PAIN 09/26/2009  . DEPRESSION 06/12/2007  . DYSPNEA 07/28/2010  . ERECTILE DYSFUNCTION 02/24/2009  . GERD 06/09/2007  . GERD (gastroesophageal reflux disease)   . GLUCOSE INTOLERANCE 01/28/2010  . Gout 2017   Dr.  . HEMORRHOIDS 06/09/2007  . History of colonic polyps 08/07/2015  . HYPERLIPIDEMIA 02/07/2008  . HYPERTENSION 06/09/2007  . HYPOKALEMIA 01/28/2010  . Impaired glucose tolerance 01/23/2011  . LOW BACK PAIN 06/12/2007  . MYOCARDIAL PERFUSION SCAN, WITH STRESS TEST, ABNORMAL 10/08/2009  . PARESTHESIA  04/25/2008  . SHOULDER PAIN, LEFT 04/25/2008  . URI 08/19/2008  . VITAMIN B12 DEFICIENCY 02/09/2010   Past Surgical History:  Procedure Laterality Date  . COLONOSCOPY    . CORONARY STENT PLACEMENT     from right radial    reports that he has never smoked. He has never used smokeless tobacco. He reports that he does not drink alcohol or use drugs. family history includes Alcohol abuse in his brother and sister; Coronary artery disease in an other family member; Heart attack in an other family member; Heart disease in his father and mother; Hyperlipidemia in his father and mother; Hypertension in his father, mother, and another family member; Stroke in an other family member. Allergies  Allergen Reactions  . Diclofenac Sodium     REACTION: maks pt drowsey   Current Outpatient Medications on File Prior to Visit  Medication Sig Dispense Refill  . amLODipine (NORVASC) 5 MG tablet TAKE 1 TABLET BY MOUTH ONCE DAILY 90 tablet 0  . aspirin EC 81 MG tablet Take 81 mg by mouth daily.    . carvedilol (COREG) 25 MG tablet TAKE 1 TABLET BY MOUTH TWICE DAILY 180 tablet 0  . clopidogrel (PLAVIX) 75 MG tablet TAKE 1 TABLET BY MOUTH ONCE DAILY 90 tablet 1  . colchicine 0.6 MG tablet Take 1 tablet (0.6 mg total) by mouth daily. 90 tablet 3  . dexlansoprazole (DEXILANT) 60 MG capsule Take 1 capsule (60 mg total) by mouth daily. 90 capsule 3  . doxycycline (VIBRA-TABS) 100 MG tablet Take 1 tablet (100 mg total) by mouth 2 (two) times daily. 20 tablet  0  . fexofenadine (ALLEGRA) 180 MG tablet Take 1 tablet (180 mg total) by mouth daily. As needed (Patient taking differently: Take 180 mg by mouth daily as needed for allergies. ) 90 tablet 3  . fluticasone (FLONASE) 50 MCG/ACT nasal spray USE TWO SPRAYS IN EACH NOSTRIL ONCE DAILY 48 g 5  . gabapentin (NEURONTIN) 100 MG capsule Take 2 capsules (200 mg total) by mouth 3 (three) times daily. 180 capsule 5  . HYDROcodone-acetaminophen (NORCO/VICODIN) 5-325 MG tablet  Take 1 tablet by mouth every 6 (six) hours as needed for moderate pain. 30 tablet 0  . lisinopril-hydrochlorothiazide (PRINZIDE,ZESTORETIC) 20-12.5 MG tablet TAKE 1 TABLET BY MOUTH ONCE DAILY 90 tablet 0  . meloxicam (MOBIC) 7.5 MG tablet TAKE 1 TO 2 TABLETS ONE TIME DAILY AS NEEDED FOR PAIN 180 tablet 3  . methylPREDNISolone (MEDROL DOSEPAK) 4 MG TBPK tablet Take Tapered dose as directed 21 tablet 0  . potassium chloride (K-DUR) 10 MEQ tablet TAKE 2 TABLETS BY MOUTH ONCE DAILY 180 tablet 1  . predniSONE (DELTASONE) 10 MG tablet 3 tabs by mouth per day for 3 days,2tabs per day for 3 days,1tab per day for 3 days 18 tablet 0  . rosuvastatin (CRESTOR) 10 MG tablet TAKE 1 TABLET BY MOUTH ONCE DAILY 90 tablet 1  . sulfamethoxazole-trimethoprim (BACTRIM DS,SEPTRA DS) 800-160 MG tablet Take 1 tablet by mouth 2 (two) times daily. 20 tablet 0  . tamsulosin (FLOMAX) 0.4 MG CAPS capsule TAKE 1 CAPSULE BY MOUTH ONCE DAILY 90 capsule 1  . traMADol-acetaminophen (ULTRACET) 37.5-325 MG tablet Take 1 tablet by mouth every 6 (six) hours as needed. 30 tablet 0  . pantoprazole (PROTONIX) 40 MG tablet Take 1 tablet (40 mg total) by mouth daily. 90 tablet 3   No current facility-administered medications on file prior to visit.    Review of Systems Constitutional: Negative for other unusual diaphoresis, sweats, appetite or weight changes HENT: Negative for other worsening hearing loss, ear pain, facial swelling, mouth sores or neck stiffness.   Eyes: Negative for other worsening pain, redness or other visual disturbance.  Respiratory: Negative for other stridor or swelling Cardiovascular: Negative for other palpitations or other chest pain  Gastrointestinal: Negative for worsening diarrhea or loose stools, blood in stool, distention or other pain Genitourinary: Negative for hematuria, flank pain or other change in urine volume.  Musculoskeletal: Negative for myalgias or other joint swelling.  Skin: Negative for  other color change, or other wound or worsening drainage.  Neurological: Negative for other syncope or numbness. Hematological: Negative for other adenopathy or swelling Psychiatric/Behavioral: Negative for hallucinations, other worsening agitation, SI, self-injury, or new decreased concentration All other system neg per pt    Objective:   Physical Exam BP (!) 144/84   Pulse 68   Temp 97.7 F (36.5 C) (Oral)   Ht 5\' 8"  (1.727 m)   Wt 203 lb (92.1 kg)   SpO2 90%   BMI 30.87 kg/m  VS noted,  Constitutional: Pt is oriented to person, place, and time. Appears well-developed and well-nourished, in no significant distress and comfortable Head: Normocephalic and atraumatic  Eyes: Conjunctivae and EOM are normal. Pupils are equal, round, and reactive to light Right Ear: External ear normal without discharge Left Ear: External ear normal without discharge Nose: Nose without discharge or deformity Mouth/Throat: Oropharynx is without other ulcerations and moist  Neck: Normal range of motion. Neck supple. No JVD present. No tracheal deviation present or significant neck LA or mass Cardiovascular: Normal rate,  regular rhythm, normal heart sounds and intact distal pulses.   Pulmonary/Chest: WOB normal and breath sounds without rales or wheezing  Abdominal: Soft. Bowel sounds are normal. NT. No HSM  Musculoskeletal: Normal range of motion. Exhibits no edema, spine nontender Lymphadenopathy: Has no other cervical adenopathy.  Neurological: Pt is alert and oriented to person, place, and time. Pt has normal reflexes. No cranial nerve deficit. Motor grossly intact, Gait intact Skin: Skin is warm and dry. No rash noted or new ulcerations Psychiatric:  Has normal mood and affect. Behavior is normal without agitation No other exam findings Lab Results  Component Value Date   WBC 5.5 12/07/2018   HGB 13.5 12/07/2018   HCT 39.5 12/07/2018   PLT 202.0 12/07/2018   GLUCOSE 91 12/07/2018   CHOL 141  12/07/2018   TRIG 171.0 (H) 12/07/2018   HDL 35.70 (L) 12/07/2018   LDLDIRECT 63.0 11/14/2017   LDLCALC 71 12/07/2018   ALT 16 12/07/2018   AST 20 12/07/2018   NA 144 12/07/2018   K 3.5 12/07/2018   CL 104 12/07/2018   CREATININE 1.02 12/07/2018   BUN 12 12/07/2018   CO2 31 12/07/2018   TSH 2.05 12/07/2018   PSA 0.48 12/07/2018   INR 1.0 ratio 10/23/2009   HGBA1C 6.2 12/07/2018   MICROALBUR 2.5 (H) 12/07/2018       Assessment & Plan:

## 2018-12-07 NOTE — Assessment & Plan Note (Addendum)
For NS referral as above  In addition to the time spent performing CPE, I spent an additional 25 minutes face to face,in which greater than 50% of this time was spent in counseling and coordination of care for patient's acute illness as documented, including the differential dx, treatment, further evaluation and other management of lumbar spinal stenosis, LBP, COPD, HTN, DM

## 2018-12-08 ENCOUNTER — Ambulatory Visit: Payer: Medicare HMO | Admitting: Internal Medicine

## 2019-01-03 ENCOUNTER — Other Ambulatory Visit: Payer: Self-pay | Admitting: Internal Medicine

## 2019-02-04 ENCOUNTER — Other Ambulatory Visit: Payer: Self-pay | Admitting: Internal Medicine

## 2019-02-20 ENCOUNTER — Telehealth: Payer: Self-pay | Admitting: Internal Medicine

## 2019-02-20 NOTE — Telephone Encounter (Signed)
Copied from [place] [id number]. Topic: General - Other >> Feb 20, 2019  4:18 PM Rainey Pines A wrote: Patients wife called and stated that patient wants a different referral to a heart doctor. Patient no longer wants to see Dr. Rockey Situ.

## 2019-02-21 ENCOUNTER — Telehealth: Payer: Self-pay | Admitting: Internal Medicine

## 2019-02-21 DIAGNOSIS — I2510 Atherosclerotic heart disease of native coronary artery without angina pectoris: Secondary | ICD-10-CM

## 2019-02-21 NOTE — Telephone Encounter (Signed)
Ok, referral done 

## 2019-02-21 NOTE — Telephone Encounter (Signed)
Ok this is done 

## 2019-03-08 DIAGNOSIS — M5136 Other intervertebral disc degeneration, lumbar region: Secondary | ICD-10-CM | POA: Diagnosis not present

## 2019-03-08 DIAGNOSIS — M5416 Radiculopathy, lumbar region: Secondary | ICD-10-CM | POA: Diagnosis not present

## 2019-03-08 DIAGNOSIS — M48062 Spinal stenosis, lumbar region with neurogenic claudication: Secondary | ICD-10-CM | POA: Diagnosis not present

## 2019-03-08 DIAGNOSIS — M549 Dorsalgia, unspecified: Secondary | ICD-10-CM | POA: Diagnosis not present

## 2019-03-08 DIAGNOSIS — M47896 Other spondylosis, lumbar region: Secondary | ICD-10-CM | POA: Diagnosis not present

## 2019-03-14 ENCOUNTER — Other Ambulatory Visit: Payer: Self-pay | Admitting: Internal Medicine

## 2019-03-15 DIAGNOSIS — M4802 Spinal stenosis, cervical region: Secondary | ICD-10-CM | POA: Diagnosis not present

## 2019-03-15 DIAGNOSIS — M48062 Spinal stenosis, lumbar region with neurogenic claudication: Secondary | ICD-10-CM | POA: Diagnosis not present

## 2019-03-15 DIAGNOSIS — M5136 Other intervertebral disc degeneration, lumbar region: Secondary | ICD-10-CM | POA: Diagnosis not present

## 2019-03-20 ENCOUNTER — Ambulatory Visit (INDEPENDENT_AMBULATORY_CARE_PROVIDER_SITE_OTHER): Payer: Medicare HMO | Admitting: Internal Medicine

## 2019-03-20 DIAGNOSIS — R350 Frequency of micturition: Secondary | ICD-10-CM | POA: Diagnosis not present

## 2019-03-20 DIAGNOSIS — J438 Other emphysema: Secondary | ICD-10-CM

## 2019-03-20 DIAGNOSIS — K219 Gastro-esophageal reflux disease without esophagitis: Secondary | ICD-10-CM

## 2019-03-20 MED ORDER — CIPROFLOXACIN HCL 500 MG PO TABS: 500.0000 mg | ORAL_TABLET | Freq: Two times a day (BID) | ORAL | 0 refills | Status: AC

## 2019-03-20 NOTE — Progress Notes (Signed)
Patient ID: Travis Carey, male   DOB: Oct 21, 1940, 78 y.o.   MRN: [id number]  Virtual Visit via Video Note  I connected with Travis Carey on 03/20/19 at  3:40 PM EDT by a video enabled telemedicine application and verified that I am speaking with the correct person using two identifiers.  Location: Patient: at home Provider: at office   I discussed the limitations of evaluation and management by telemedicine and the availability of in person appointments. The patient expressed understanding and agreed to proceed.  History of Present Illness: Here with acute onset 3-4 days mild to mod urinary frequency and unusual nocturia, assoc with left flank pain, but without n/v, fever or blood.  Pt denies chest pain, increased sob or doe, wheezing, orthopnea, PND, increased LE swelling, palpitations, dizziness or syncope.  Pt denies new neurological symptoms such as new headache, or facial or extremity weakness or numbness   Pt denies polydipsia, polyuria.  Denies worsening reflux, abd pain, dysphagia, n/v, bowel change or blood. Past Medical History:  Diagnosis Date  . Allergic rhinitis, cause unspecified 01/31/2013  . ANEMIA-NOS 01/28/2010  . BPH (benign prostatic hypertrophy) 04/25/2013  . CAD 02/17/2010  . CAROTID BRUIT 11/18/2009  . Carpal tunnel syndrome 05/20/2008  . Cervical radiculitis 04/17/2012  . CHEST PAIN 09/26/2009  . DEPRESSION 06/12/2007  . DYSPNEA 07/28/2010  . ERECTILE DYSFUNCTION 02/24/2009  . GERD 06/09/2007  . GERD (gastroesophageal reflux disease)   . GLUCOSE INTOLERANCE 01/28/2010  . Gout 2017   Dr.  . HEMORRHOIDS 06/09/2007  . History of colonic polyps 08/07/2015  . HYPERLIPIDEMIA 02/07/2008  . HYPERTENSION 06/09/2007  . HYPOKALEMIA 01/28/2010  . Impaired glucose tolerance 01/23/2011  . LOW BACK PAIN 06/12/2007  . MYOCARDIAL PERFUSION SCAN, WITH STRESS TEST, ABNORMAL 10/08/2009  . PARESTHESIA 04/25/2008  . SHOULDER PAIN, LEFT 04/25/2008  . URI 08/19/2008  . VITAMIN B12 DEFICIENCY  02/09/2010   Past Surgical History:  Procedure Laterality Date  . COLONOSCOPY    . CORONARY STENT PLACEMENT     from right radial    reports that he has never smoked. He has never used smokeless tobacco. He reports that he does not drink alcohol or use drugs. family history includes Alcohol abuse in his brother and sister; Coronary artery disease in an other family member; Heart attack in an other family member; Heart disease in his father and mother; Hyperlipidemia in his father and mother; Hypertension in his father, mother, and another family member; Stroke in an other family member. Allergies  Allergen Reactions  . Diclofenac Sodium     REACTION: maks pt drowsey   Current Outpatient Medications on File Prior to Visit  Medication Sig Dispense Refill  . amLODipine (NORVASC) 5 MG tablet Take 1 tablet by mouth once daily 90 tablet 3  . aspirin EC 81 MG tablet Take 81 mg by mouth daily.    . carvedilol (COREG) 25 MG tablet TAKE 1 TABLET BY MOUTH TWICE DAILY 180 tablet 0  . clopidogrel (PLAVIX) 75 MG tablet Take 1 tablet by mouth once daily 90 tablet 2  . colchicine 0.6 MG tablet Take 1 tablet (0.6 mg total) by mouth daily. 90 tablet 3  . dexlansoprazole (DEXILANT) 60 MG capsule Take 1 capsule (60 mg total) by mouth daily. 90 capsule 3  . doxycycline (VIBRA-TABS) 100 MG tablet Take 1 tablet (100 mg total) by mouth 2 (two) times daily. 20 tablet 0  . fexofenadine (ALLEGRA) 180 MG tablet Take 1 tablet (180 mg total) by  mouth daily. As needed (Patient taking differently: Take 180 mg by mouth daily as needed for allergies. ) 90 tablet 3  . fluticasone (FLONASE) 50 MCG/ACT nasal spray USE TWO SPRAYS IN EACH NOSTRIL ONCE DAILY 48 g 5  . gabapentin (NEURONTIN) 100 MG capsule Take 2 capsules (200 mg total) by mouth 3 (three) times daily. 180 capsule 5  . HYDROcodone-acetaminophen (NORCO/VICODIN) 5-325 MG tablet Take 1 tablet by mouth every 6 (six) hours as needed for moderate pain. 30 tablet 0  .  lisinopril-hydrochlorothiazide (ZESTORETIC) 20-12.5 MG tablet Take 1 tablet by mouth once daily 90 tablet 3  . meloxicam (MOBIC) 7.5 MG tablet TAKE 1 TO 2 TABLETS ONE TIME DAILY AS NEEDED FOR PAIN 180 tablet 3  . methylPREDNISolone (MEDROL DOSEPAK) 4 MG TBPK tablet Take Tapered dose as directed 21 tablet 0  . pantoprazole (PROTONIX) 40 MG tablet Take 1 tablet (40 mg total) by mouth daily. 90 tablet 3  . potassium chloride (K-DUR) 10 MEQ tablet Take 2 tablets by mouth once daily 180 tablet 0  . predniSONE (DELTASONE) 10 MG tablet 3 tabs by mouth per day for 3 days,2tabs per day for 3 days,1tab per day for 3 days 18 tablet 0  . rosuvastatin (CRESTOR) 10 MG tablet Take 1 tablet by mouth once daily 90 tablet 0  . sulfamethoxazole-trimethoprim (BACTRIM DS,SEPTRA DS) 800-160 MG tablet Take 1 tablet by mouth 2 (two) times daily. 20 tablet 0  . tamsulosin (FLOMAX) 0.4 MG CAPS capsule TAKE 1 CAPSULE BY MOUTH ONCE DAILY 90 capsule 1  . traMADol-acetaminophen (ULTRACET) 37.5-325 MG tablet Take 1 tablet by mouth every 6 (six) hours as needed. 30 tablet 0   No current facility-administered medications on file prior to visit.     Observations/Objective: Alert, NAD, appropriate mood and affect, resps normal, cn 2-12 intact, moves all 4s, no visible rash or swelling Lab Results  Component Value Date   WBC 5.5 12/07/2018   HGB 13.5 12/07/2018   HCT 39.5 12/07/2018   PLT 202.0 12/07/2018   GLUCOSE 91 12/07/2018   CHOL 141 12/07/2018   TRIG 171.0 (H) 12/07/2018   HDL 35.70 (L) 12/07/2018   LDLDIRECT 63.0 11/14/2017   LDLCALC 71 12/07/2018   ALT 16 12/07/2018   AST 20 12/07/2018   NA 144 12/07/2018   K 3.5 12/07/2018   CL 104 12/07/2018   CREATININE 1.02 12/07/2018   BUN 12 12/07/2018   CO2 31 12/07/2018   TSH 2.05 12/07/2018   PSA 0.48 12/07/2018   INR 1.0 ratio 10/23/2009   HGBA1C 6.2 12/07/2018   MICROALBUR 2.5 (H) 12/07/2018    Assessment and Plan: See notes  Follow Up Instructions: See  notes   I discussed the assessment and treatment plan with the patient. The patient was provided an opportunity to ask questions and all were answered. The patient agreed with the plan and demonstrated an understanding of the instructions.   The patient was advised to call back or seek an in-person evaluation if the symptoms worsen or if the condition fails to improve as anticipated.   Cathlean Cower, MD

## 2019-03-20 NOTE — Patient Instructions (Signed)
Please take all new medication as prescribed - the antibiotic  Please continue all other medications as before, and refills have been done if requested.  Please have the pharmacy call with any other refills you may need.  Please continue your efforts at being more active, low cholesterol diet, and weight control.  Please keep your appointments with your specialists as you may have planned  Please go to the LAB in the Basement (turn left off the elevator) for the tests to be done  - just the urine testing  You will be contacted by phone if any changes need to be made immediately.  Otherwise, you will receive a letter about your results with an explanation, but please check with MyChart first.  Please remember to sign up for MyChart if you have not done so, as this will be important to you in the future with finding out test results, communicating by private email, and scheduling acute appointments online when needed.

## 2019-03-21 ENCOUNTER — Encounter: Payer: Self-pay | Admitting: Internal Medicine

## 2019-03-21 ENCOUNTER — Other Ambulatory Visit (INDEPENDENT_AMBULATORY_CARE_PROVIDER_SITE_OTHER): Payer: Medicare HMO

## 2019-03-21 DIAGNOSIS — R350 Frequency of micturition: Secondary | ICD-10-CM | POA: Diagnosis not present

## 2019-03-21 DIAGNOSIS — E1165 Type 2 diabetes mellitus with hyperglycemia: Secondary | ICD-10-CM

## 2019-03-21 LAB — URINALYSIS, ROUTINE W REFLEX MICROSCOPIC
Bilirubin Urine: NEGATIVE
Hgb urine dipstick: NEGATIVE
Ketones, ur: NEGATIVE
Leukocytes,Ua: NEGATIVE
Nitrite: NEGATIVE
Specific Gravity, Urine: 1.025 (ref 1.000–1.030)
Total Protein, Urine: NEGATIVE
Urine Glucose: NEGATIVE
Urobilinogen, UA: 0.2 (ref 0.0–1.0)
pH: 5.5 (ref 5.0–8.0)

## 2019-03-21 LAB — HEPATIC FUNCTION PANEL
ALT: 11 U/L (ref 0–53)
AST: 14 U/L (ref 0–37)
Albumin: 3.7 g/dL (ref 3.5–5.2)
Alkaline Phosphatase: 61 U/L (ref 39–117)
Bilirubin, Direct: 0 mg/dL (ref 0.0–0.3)
Total Bilirubin: 0.4 mg/dL (ref 0.2–1.2)
Total Protein: 6.7 g/dL (ref 6.0–8.3)

## 2019-03-21 LAB — BASIC METABOLIC PANEL
BUN: 13 mg/dL (ref 6–23)
CO2: 29 mEq/L (ref 19–32)
Calcium: 9.4 mg/dL (ref 8.4–10.5)
Chloride: 107 mEq/L (ref 96–112)
Creatinine, Ser: 1.07 mg/dL (ref 0.40–1.50)
GFR: 81.04 mL/min (ref >60.00)
Glucose, Bld: 139 mg/dL — ABNORMAL HIGH (ref 70–99)
Potassium: 3.4 mEq/L — ABNORMAL LOW (ref 3.5–5.1)
Sodium: 142 mEq/L (ref 135–145)

## 2019-03-21 LAB — LIPID PANEL
Cholesterol: 126 mg/dL (ref 0–200)
HDL: 27.9 mg/dL — ABNORMAL LOW (ref >39.00)
NonHDL: 98.05
Total CHOL/HDL Ratio: 5
Triglycerides: 240 mg/dL — ABNORMAL HIGH (ref 0.0–149.0)
VLDL: 48 mg/dL — ABNORMAL HIGH (ref 0.0–40.0)

## 2019-03-21 LAB — HEMOGLOBIN A1C: Hgb A1c MFr Bld: 6.5 % (ref 4.6–6.5)

## 2019-03-21 LAB — LDL CHOLESTEROL, DIRECT: Direct LDL: 61 mg/dL

## 2019-03-21 NOTE — Assessment & Plan Note (Signed)
stable overall by history and exam, recent data reviewed with pt, and pt to continue medical treatment as before,  to f/u any worsening symptoms or concerns  

## 2019-03-21 NOTE — Assessment & Plan Note (Signed)
C/w probable UTI, for urine studies, and empiric antibx,  to f/u any worsening symptoms or concerns

## 2019-03-22 LAB — URINE CULTURE
MICRO NUMBER:: 555805
Result:: NO GROWTH
SPECIMEN QUALITY:: ADEQUATE

## 2019-04-04 ENCOUNTER — Other Ambulatory Visit: Payer: Self-pay | Admitting: Internal Medicine

## 2019-04-04 DIAGNOSIS — M48062 Spinal stenosis, lumbar region with neurogenic claudication: Secondary | ICD-10-CM | POA: Diagnosis not present

## 2019-04-26 DIAGNOSIS — Z79899 Other long term (current) drug therapy: Secondary | ICD-10-CM | POA: Diagnosis not present

## 2019-04-26 DIAGNOSIS — M4802 Spinal stenosis, cervical region: Secondary | ICD-10-CM | POA: Diagnosis not present

## 2019-04-26 DIAGNOSIS — G894 Chronic pain syndrome: Secondary | ICD-10-CM | POA: Diagnosis not present

## 2019-04-26 DIAGNOSIS — Z79891 Long term (current) use of opiate analgesic: Secondary | ICD-10-CM | POA: Diagnosis not present

## 2019-04-26 DIAGNOSIS — M5136 Other intervertebral disc degeneration, lumbar region: Secondary | ICD-10-CM | POA: Diagnosis not present

## 2019-04-26 DIAGNOSIS — M48062 Spinal stenosis, lumbar region with neurogenic claudication: Secondary | ICD-10-CM | POA: Diagnosis not present

## 2019-05-06 ENCOUNTER — Other Ambulatory Visit: Payer: Self-pay | Admitting: Internal Medicine

## 2019-05-14 DIAGNOSIS — M48062 Spinal stenosis, lumbar region with neurogenic claudication: Secondary | ICD-10-CM | POA: Diagnosis not present

## 2019-06-02 ENCOUNTER — Other Ambulatory Visit: Payer: Self-pay | Admitting: Internal Medicine

## 2019-06-04 ENCOUNTER — Ambulatory Visit: Payer: Medicare HMO | Admitting: Cardiovascular Disease

## 2019-06-04 ENCOUNTER — Telehealth: Payer: Self-pay

## 2019-06-04 DIAGNOSIS — I2510 Atherosclerotic heart disease of native coronary artery without angina pectoris: Secondary | ICD-10-CM

## 2019-06-04 NOTE — Telephone Encounter (Signed)
There is no referral for this patient.

## 2019-06-04 NOTE — Telephone Encounter (Signed)
Copied from [place]. Topic: General - Other >> Jun 01, 2019  2:18 PM Leward Quan A wrote: Reason for CRM: Patient called to say that he would like a different Cardiologist and not Dr Rockey Situ because he did not care for his way of patient care when he had him previously. Asking for a call back as soon as possible.  Ph# [phone] or [phone]

## 2019-06-05 NOTE — Telephone Encounter (Signed)
Ok, this referral is done

## 2019-06-05 NOTE — Addendum Note (Signed)
Addended by: Biagio Borg on: 06/05/2019 06:48 AM   Modules accepted: Orders

## 2019-06-07 ENCOUNTER — Ambulatory Visit (INDEPENDENT_AMBULATORY_CARE_PROVIDER_SITE_OTHER): Payer: Medicare HMO | Admitting: Internal Medicine

## 2019-06-07 ENCOUNTER — Encounter: Payer: Self-pay | Admitting: Internal Medicine

## 2019-06-07 ENCOUNTER — Other Ambulatory Visit: Payer: Self-pay

## 2019-06-07 VITALS — BP 122/78 | HR 76 | Temp 98.4°F | Ht 68.0 in | Wt 201.0 lb

## 2019-06-07 DIAGNOSIS — E782 Mixed hyperlipidemia: Secondary | ICD-10-CM | POA: Diagnosis not present

## 2019-06-07 DIAGNOSIS — K219 Gastro-esophageal reflux disease without esophagitis: Secondary | ICD-10-CM | POA: Diagnosis not present

## 2019-06-07 DIAGNOSIS — M79642 Pain in left hand: Secondary | ICD-10-CM

## 2019-06-07 DIAGNOSIS — M79641 Pain in right hand: Secondary | ICD-10-CM | POA: Diagnosis not present

## 2019-06-07 DIAGNOSIS — E876 Hypokalemia: Secondary | ICD-10-CM

## 2019-06-07 DIAGNOSIS — D369 Benign neoplasm, unspecified site: Secondary | ICD-10-CM

## 2019-06-07 DIAGNOSIS — I10 Essential (primary) hypertension: Secondary | ICD-10-CM | POA: Diagnosis not present

## 2019-06-07 DIAGNOSIS — E1165 Type 2 diabetes mellitus with hyperglycemia: Secondary | ICD-10-CM | POA: Diagnosis not present

## 2019-06-07 DIAGNOSIS — Z0000 Encounter for general adult medical examination without abnormal findings: Secondary | ICD-10-CM

## 2019-06-07 DIAGNOSIS — J438 Other emphysema: Secondary | ICD-10-CM

## 2019-06-07 DIAGNOSIS — E559 Vitamin D deficiency, unspecified: Secondary | ICD-10-CM

## 2019-06-07 DIAGNOSIS — E538 Deficiency of other specified B group vitamins: Secondary | ICD-10-CM | POA: Diagnosis not present

## 2019-06-07 DIAGNOSIS — E611 Iron deficiency: Secondary | ICD-10-CM

## 2019-06-07 MED ORDER — POTASSIUM CHLORIDE ER 10 MEQ PO TBCR: 20.0000 meq | EXTENDED_RELEASE_TABLET | Freq: Every day | ORAL | 3 refills | Status: DC

## 2019-06-07 MED ORDER — DICLOFENAC SODIUM 1 % TD GEL: 2.0000 g | Freq: Four times a day (QID) | TRANSDERMAL | 5 refills | Status: AC | PRN

## 2019-06-07 MED ORDER — PANTOPRAZOLE SODIUM 40 MG PO TBEC: 40.0000 mg | DELAYED_RELEASE_TABLET | Freq: Every day | ORAL | 3 refills | Status: DC

## 2019-06-07 NOTE — Assessment & Plan Note (Signed)
stable overall by history and exam, recent data reviewed with pt, and pt to continue medical treatment as before,  to f/u any worsening symptoms or concerns  

## 2019-06-07 NOTE — Patient Instructions (Addendum)
You will be contacted regarding the referral for: colonoscopy  You will be contacted regarding the referral for: voltaren gel for pain in the hands  Please continue all other medications as before, and refills have been done if requested - the potassium  Please have the pharmacy call with any other refills you may need.  Please continue your efforts at being more active, low cholesterol diet, and weight control.  Please keep your appointments with your specialists as you may have planned  Please return in 6 months, or sooner if needed, with Lab testing done 3-5 days before

## 2019-06-07 NOTE — Assessment & Plan Note (Signed)
OK for volt gel prn,  to f/u any worsening symptoms or concerns

## 2019-06-07 NOTE — Assessment & Plan Note (Signed)
Mild persistent, to restart the protonix asd,  to f/u any worsening symptoms or concerns

## 2019-06-07 NOTE — Assessment & Plan Note (Addendum)
stable overall by history and exam, recent data reviewed with pt, and pt to continue medical treatment as before,  to f/u any worsening symptoms or concerns  Note:  Total time for pt hx, exam, review of record with pt in the room, determination of diagnoses and plan for further eval and tx is > 40 min, with over 50% spent in coordination and counseling of patient including the differential dx, tx, further evaluation and other management of copd, hypokalemia, HTN, HLD, GERD, DM, b12 deficiency, bilateral hand arthritis

## 2019-06-07 NOTE — Assessment & Plan Note (Signed)
Also for b12 level °

## 2019-06-07 NOTE — Progress Notes (Signed)
Subjective:    Patient ID: Travis Carey, male    DOB: 02/02/1941, 78 y.o.   MRN: [id number]  HPI  Here to f/u; overall doing ok,  Pt denies chest pain, increasing sob or doe, wheezing, orthopnea, PND, increased LE swelling, palpitations, dizziness or syncope.  Pt denies new neurological symptoms such as new headache, or facial or extremity weakness or numbness.  Pt denies polydipsia, polyuria, or low sugar episode.  Pt states overall good compliance with meds, mostly trying to follow appropriate diet, with wt overall stable,  but little exercise however. Pt c/o mild worsening reflux, but no abd pain, dysphagia, n/v, bowel change or blood.  Has not taken PPI in the past month after running outl  ALso c/o bilat hand arthritic pain, asks for tx.   Pt denies fever, wt loss, night sweats, loss of appetite, or other constitutional symptoms Past Medical History:  Diagnosis Date  . Allergic rhinitis, cause unspecified 01/31/2013  . ANEMIA-NOS 01/28/2010  . BPH (benign prostatic hypertrophy) 04/25/2013  . CAD 02/17/2010  . CAROTID BRUIT 11/18/2009  . Carpal tunnel syndrome 05/20/2008  . Cervical radiculitis 04/17/2012  . CHEST PAIN 09/26/2009  . DEPRESSION 06/12/2007  . DYSPNEA 07/28/2010  . ERECTILE DYSFUNCTION 02/24/2009  . GERD 06/09/2007  . GERD (gastroesophageal reflux disease)   . GLUCOSE INTOLERANCE 01/28/2010  . Gout 2017   Dr.John  . HEMORRHOIDS 06/09/2007  . History of colonic polyps 08/07/2015  . HYPERLIPIDEMIA 02/07/2008  . HYPERTENSION 06/09/2007  . HYPOKALEMIA 01/28/2010  . Impaired glucose tolerance 01/23/2011  . LOW BACK PAIN 06/12/2007  . MYOCARDIAL PERFUSION SCAN, WITH STRESS TEST, ABNORMAL 10/08/2009  . PARESTHESIA 04/25/2008  . SHOULDER PAIN, LEFT 04/25/2008  . URI 08/19/2008  . VITAMIN B12 DEFICIENCY 02/09/2010   Past Surgical History:  Procedure Laterality Date  . COLONOSCOPY    . CORONARY STENT PLACEMENT     from right radial    reports that he has never smoked. He has never used  smokeless tobacco. He reports that he does not drink alcohol or use drugs. family history includes Alcohol abuse in his brother and sister; Coronary artery disease in an other family member; Heart attack in an other family member; Heart disease in his father and mother; Hyperlipidemia in his father and mother; Hypertension in his father, mother, and another family member; Stroke in an other family member. Allergies  Allergen Reactions  . Diclofenac Sodium     REACTION: maks pt drowsey   Current Outpatient Medications on File Prior to Visit  Medication Sig Dispense Refill  . amLODipine (NORVASC) 5 MG tablet Take 1 tablet by mouth once daily 90 tablet 3  . aspirin EC 81 MG tablet Take 81 mg by mouth daily.    . carvedilol (COREG) 25 MG tablet Take 1 tablet by mouth twice daily 180 tablet 0  . clopidogrel (PLAVIX) 75 MG tablet Take 1 tablet by mouth once daily 90 tablet 2  . colchicine 0.6 MG tablet Take 1 tablet (0.6 mg total) by mouth daily. 90 tablet 3  . dexlansoprazole (DEXILANT) 60 MG capsule Take 1 capsule (60 mg total) by mouth daily. 90 capsule 3  . doxycycline (VIBRA-TABS) 100 MG tablet Take 1 tablet (100 mg total) by mouth 2 (two) times daily. 20 tablet 0  . fexofenadine (ALLEGRA) 180 MG tablet Take 1 tablet (180 mg total) by mouth daily. As needed (Patient taking differently: Take 180 mg by mouth daily as needed for allergies. ) 90 tablet 3  .  fluticasone (FLONASE) 50 MCG/ACT nasal spray USE TWO SPRAYS IN EACH NOSTRIL ONCE DAILY 48 g 5  . gabapentin (NEURONTIN) 100 MG capsule Take 2 capsules (200 mg total) by mouth 3 (three) times daily. 180 capsule 5  . HYDROcodone-acetaminophen (NORCO/VICODIN) 5-325 MG tablet Take 1 tablet by mouth every 6 (six) hours as needed for moderate pain. 30 tablet 0  . lisinopril-hydrochlorothiazide (ZESTORETIC) 20-12.5 MG tablet Take 1 tablet by mouth once daily 90 tablet 3  . meloxicam (MOBIC) 7.5 MG tablet TAKE 1 TO 2 TABLETS BY MOUTH ONCE DAILY AS NEEDED  FOR PAIN 180 tablet 0  . methylPREDNISolone (MEDROL DOSEPAK) 4 MG TBPK tablet Take Tapered dose as directed 21 tablet 0  . predniSONE (DELTASONE) 10 MG tablet 3 tabs by mouth per day for 3 days,2tabs per day for 3 days,1tab per day for 3 days 18 tablet 0  . rosuvastatin (CRESTOR) 10 MG tablet Take 1 tablet by mouth once daily 90 tablet 0  . sulfamethoxazole-trimethoprim (BACTRIM DS,SEPTRA DS) 800-160 MG tablet Take 1 tablet by mouth 2 (two) times daily. 20 tablet 0  . tamsulosin (FLOMAX) 0.4 MG CAPS capsule TAKE 1 CAPSULE BY MOUTH ONCE DAILY 90 capsule 1  . traMADol-acetaminophen (ULTRACET) 37.5-325 MG tablet Take 1 tablet by mouth every 6 (six) hours as needed. 30 tablet 0   No current facility-administered medications on file prior to visit.    Review of Systems VS noted,  Constitutional: Pt appears in NAD HENT: Head: NCAT.  Right Ear: External ear normal.  Left Ear: External ear normal.  Eyes: . Pupils are equal, round, and reactive to light. Conjunctivae and EOM are normal Nose: without d/c or deformity Neck: Neck supple. Gross normal ROM Cardiovascular: Normal rate and regular rhythm.   Pulmonary/Chest: Effort normal and breath sounds without rales or wheezing.  Abd:  Soft, NT, ND, + BS, no organomegaly Neurological: Pt is alert. At baseline orientation, motor grossly intact Skin: Skin is warm. No rashes, other new lesions, no LE edema Psychiatric: Pt behavior is normal without agitation  No other exam findings    Objective:   Physical Exam BP 122/78   Pulse 76   Temp 98.4 F (36.9 C) (Oral)   Ht 5\' 8"  (1.727 m)   Wt 201 lb (91.2 kg)   SpO2 96%   BMI 30.56 kg/m  VS noted,  Constitutional: Pt appears in NAD HENT: Head: NCAT.  Right Ear: External ear normal.  Left Ear: External ear normal.  Eyes: . Pupils are equal, round, and reactive to light. Conjunctivae and EOM are normal Nose: without d/c or deformity Neck: Neck supple. Gross normal ROM Cardiovascular: Normal  rate and regular rhythm.   Pulmonary/Chest: Effort normal and breath sounds without rales or wheezing.  Abd:  Soft, NT, ND, + BS, no organomegaly Neurological: Pt is alert. At baseline orientation, motor grossly intact Skin: Skin is warm. No rashes, other new lesions, no LE edema Psychiatric: Pt behavior is normal without agitation  No new complaints Lab Results  Component Value Date   WBC 5.5 12/07/2018   HGB 13.5 12/07/2018   HCT 39.5 12/07/2018   PLT 202.0 12/07/2018   GLUCOSE 139 (H) 03/21/2019   CHOL 126 03/21/2019   TRIG 240.0 (H) 03/21/2019   HDL 27.90 (L) 03/21/2019   LDLDIRECT 61.0 03/21/2019   LDLCALC 71 12/07/2018   ALT 11 03/21/2019   AST 14 03/21/2019   NA 142 03/21/2019   K 3.4 (L) 03/21/2019   CL 107 03/21/2019  CREATININE 1.07 03/21/2019   BUN 13 03/21/2019   CO2 29 03/21/2019   TSH 2.05 12/07/2018   PSA 0.48 12/07/2018   INR 1.0 ratio 10/23/2009   HGBA1C 6.5 03/21/2019   MICROALBUR 2.5 (H) 12/07/2018       Assessment & Plan:

## 2019-06-21 ENCOUNTER — Encounter: Payer: Self-pay | Admitting: Internal Medicine

## 2019-07-04 ENCOUNTER — Telehealth: Payer: Self-pay

## 2019-07-04 ENCOUNTER — Other Ambulatory Visit: Payer: Self-pay | Admitting: Internal Medicine

## 2019-07-04 DIAGNOSIS — I2510 Atherosclerotic heart disease of native coronary artery without angina pectoris: Secondary | ICD-10-CM

## 2019-07-04 NOTE — Telephone Encounter (Signed)
Wife called back .  Appt cancelled with [facility] [place].  Per request closing referral and sending response to pcp.

## 2019-07-04 NOTE — Telephone Encounter (Signed)
Called patient because established with [facility] but scheduled by call center to see Dr. Saunders Revel as a new patient.  Per patient wife patient is not satisfied and wanted to see another provider but she would like to speak with patient before cancelling this appt.   Patient wife advised both providers have to agree to change.  Patient wife also advised that pcp referral was placed to same office.   Patient wife states we should know he doesn't want to see Dr Gwenyth Ober partner as anyone with common sense should be able to figure this out.     She was advised to call office atleast 24 hrs prior to appt to cancel to avoid a $50 fee.    Will fu with patient if appt not cancelled or new referral not placed to another provider next week .    Dr. Rockey Situ / Dr. Saunders Revel - is the change ok with you ?

## 2019-07-05 NOTE — Telephone Encounter (Signed)
I have not seen him since 4/18 No issues at that time

## 2019-07-08 ENCOUNTER — Other Ambulatory Visit: Payer: Self-pay | Admitting: Internal Medicine

## 2019-07-13 ENCOUNTER — Encounter: Payer: Self-pay | Admitting: Internal Medicine

## 2019-07-16 ENCOUNTER — Ambulatory Visit: Payer: Medicare HMO | Admitting: Internal Medicine

## 2019-08-01 ENCOUNTER — Other Ambulatory Visit: Payer: Self-pay | Admitting: Internal Medicine

## 2019-08-06 DIAGNOSIS — Z7689 Persons encountering health services in other specified circumstances: Secondary | ICD-10-CM | POA: Diagnosis not present

## 2019-08-06 DIAGNOSIS — I70219 Atherosclerosis of native arteries of extremities with intermittent claudication, unspecified extremity: Secondary | ICD-10-CM | POA: Diagnosis not present

## 2019-08-06 DIAGNOSIS — I25118 Atherosclerotic heart disease of native coronary artery with other forms of angina pectoris: Secondary | ICD-10-CM | POA: Diagnosis not present

## 2019-08-06 DIAGNOSIS — I6523 Occlusion and stenosis of bilateral carotid arteries: Secondary | ICD-10-CM | POA: Diagnosis not present

## 2019-08-06 DIAGNOSIS — I10 Essential (primary) hypertension: Secondary | ICD-10-CM | POA: Diagnosis not present

## 2019-08-16 DIAGNOSIS — I70219 Atherosclerosis of native arteries of extremities with intermittent claudication, unspecified extremity: Secondary | ICD-10-CM | POA: Diagnosis not present

## 2019-08-16 DIAGNOSIS — I6523 Occlusion and stenosis of bilateral carotid arteries: Secondary | ICD-10-CM | POA: Diagnosis not present

## 2019-08-21 ENCOUNTER — Other Ambulatory Visit: Payer: Self-pay | Admitting: Internal Medicine

## 2019-08-23 DIAGNOSIS — I25118 Atherosclerotic heart disease of native coronary artery with other forms of angina pectoris: Secondary | ICD-10-CM | POA: Diagnosis not present

## 2019-09-04 DIAGNOSIS — E782 Mixed hyperlipidemia: Secondary | ICD-10-CM | POA: Diagnosis not present

## 2019-09-04 DIAGNOSIS — Z7689 Persons encountering health services in other specified circumstances: Secondary | ICD-10-CM | POA: Diagnosis not present

## 2019-09-04 DIAGNOSIS — I6523 Occlusion and stenosis of bilateral carotid arteries: Secondary | ICD-10-CM | POA: Diagnosis not present

## 2019-09-04 DIAGNOSIS — I2510 Atherosclerotic heart disease of native coronary artery without angina pectoris: Secondary | ICD-10-CM | POA: Diagnosis not present

## 2019-09-04 DIAGNOSIS — I10 Essential (primary) hypertension: Secondary | ICD-10-CM | POA: Diagnosis not present

## 2019-09-16 DIAGNOSIS — H1132 Conjunctival hemorrhage, left eye: Secondary | ICD-10-CM | POA: Diagnosis not present

## 2019-09-19 DIAGNOSIS — E119 Type 2 diabetes mellitus without complications: Secondary | ICD-10-CM | POA: Diagnosis not present

## 2019-09-19 DIAGNOSIS — E782 Mixed hyperlipidemia: Secondary | ICD-10-CM | POA: Diagnosis not present

## 2019-09-19 DIAGNOSIS — I2510 Atherosclerotic heart disease of native coronary artery without angina pectoris: Secondary | ICD-10-CM | POA: Diagnosis not present

## 2019-09-19 DIAGNOSIS — M79641 Pain in right hand: Secondary | ICD-10-CM | POA: Diagnosis not present

## 2019-09-19 DIAGNOSIS — K5900 Constipation, unspecified: Secondary | ICD-10-CM | POA: Diagnosis not present

## 2019-09-19 DIAGNOSIS — E538 Deficiency of other specified B group vitamins: Secondary | ICD-10-CM | POA: Diagnosis not present

## 2019-09-19 DIAGNOSIS — Z7982 Long term (current) use of aspirin: Secondary | ICD-10-CM | POA: Diagnosis not present

## 2019-09-19 DIAGNOSIS — H1132 Conjunctival hemorrhage, left eye: Secondary | ICD-10-CM | POA: Diagnosis not present

## 2019-09-19 DIAGNOSIS — I10 Essential (primary) hypertension: Secondary | ICD-10-CM | POA: Diagnosis not present

## 2019-09-19 DIAGNOSIS — I6523 Occlusion and stenosis of bilateral carotid arteries: Secondary | ICD-10-CM | POA: Diagnosis not present

## 2019-09-19 DIAGNOSIS — M79642 Pain in left hand: Secondary | ICD-10-CM | POA: Diagnosis not present

## 2019-09-19 DIAGNOSIS — E785 Hyperlipidemia, unspecified: Secondary | ICD-10-CM | POA: Diagnosis not present

## 2019-10-19 ENCOUNTER — Other Ambulatory Visit: Payer: Self-pay | Admitting: Internal Medicine

## 2019-10-24 DIAGNOSIS — I517 Cardiomegaly: Secondary | ICD-10-CM | POA: Diagnosis not present

## 2019-10-24 DIAGNOSIS — Z7982 Long term (current) use of aspirin: Secondary | ICD-10-CM | POA: Diagnosis not present

## 2019-10-24 DIAGNOSIS — E119 Type 2 diabetes mellitus without complications: Secondary | ICD-10-CM | POA: Diagnosis not present

## 2019-10-24 DIAGNOSIS — M109 Gout, unspecified: Secondary | ICD-10-CM | POA: Diagnosis not present

## 2019-10-24 DIAGNOSIS — I2510 Atherosclerotic heart disease of native coronary artery without angina pectoris: Secondary | ICD-10-CM | POA: Diagnosis not present

## 2019-10-24 DIAGNOSIS — I119 Hypertensive heart disease without heart failure: Secondary | ICD-10-CM | POA: Diagnosis not present

## 2019-10-24 DIAGNOSIS — E538 Deficiency of other specified B group vitamins: Secondary | ICD-10-CM | POA: Diagnosis not present

## 2019-10-24 DIAGNOSIS — K5909 Other constipation: Secondary | ICD-10-CM | POA: Diagnosis not present

## 2019-10-24 DIAGNOSIS — E7800 Pure hypercholesterolemia, unspecified: Secondary | ICD-10-CM | POA: Diagnosis not present

## 2019-10-30 DIAGNOSIS — H25813 Combined forms of age-related cataract, bilateral: Secondary | ICD-10-CM | POA: Diagnosis not present

## 2019-10-30 DIAGNOSIS — H524 Presbyopia: Secondary | ICD-10-CM | POA: Diagnosis not present

## 2019-11-12 ENCOUNTER — Other Ambulatory Visit: Payer: Self-pay | Admitting: Internal Medicine

## 2019-11-12 NOTE — Telephone Encounter (Signed)
Please refill as per office routine med refill policy (all routine meds refilled for 3 mo or monthly per pt preference up to one year from last visit, then month to month grace period for 3 mo, then further med refills will have to be denied)  

## 2019-11-22 ENCOUNTER — Other Ambulatory Visit: Payer: Self-pay | Admitting: Internal Medicine

## 2019-11-22 NOTE — Telephone Encounter (Signed)
Please refill as per office routine med refill policy (all routine meds refilled for 3 mo or monthly per pt preference up to one year from last visit, then month to month grace period for 3 mo, then further med refills will have to be denied)  

## 2019-11-28 ENCOUNTER — Other Ambulatory Visit: Payer: Self-pay | Admitting: Internal Medicine

## 2019-12-13 ENCOUNTER — Ambulatory Visit: Payer: Medicare HMO | Admitting: Internal Medicine

## 2020-01-13 ENCOUNTER — Other Ambulatory Visit: Payer: Self-pay | Admitting: Internal Medicine

## 2020-01-14 NOTE — Telephone Encounter (Signed)
Please refill as per office routine med refill policy (all routine meds refilled for 3 mo or monthly per pt preference up to one year from last visit, then month to month grace period for 3 mo, then further med refills will have to be denied)  

## 2020-02-15 DIAGNOSIS — E782 Mixed hyperlipidemia: Secondary | ICD-10-CM | POA: Diagnosis not present

## 2020-02-15 DIAGNOSIS — E538 Deficiency of other specified B group vitamins: Secondary | ICD-10-CM | POA: Diagnosis not present

## 2020-02-15 DIAGNOSIS — I10 Essential (primary) hypertension: Secondary | ICD-10-CM | POA: Diagnosis not present

## 2020-02-17 ENCOUNTER — Other Ambulatory Visit: Payer: Self-pay | Admitting: Internal Medicine

## 2020-02-17 NOTE — Telephone Encounter (Signed)
Please refill as per office routine med refill policy (all routine meds refilled for 3 mo or monthly per pt preference up to one year from last visit, then month to month grace period for 3 mo, then further med refills will have to be denied)  

## 2020-02-22 DIAGNOSIS — I6523 Occlusion and stenosis of bilateral carotid arteries: Secondary | ICD-10-CM | POA: Diagnosis not present

## 2020-02-22 DIAGNOSIS — Z0000 Encounter for general adult medical examination without abnormal findings: Secondary | ICD-10-CM | POA: Diagnosis not present

## 2020-02-22 DIAGNOSIS — K5904 Chronic idiopathic constipation: Secondary | ICD-10-CM | POA: Diagnosis not present

## 2020-02-22 DIAGNOSIS — I10 Essential (primary) hypertension: Secondary | ICD-10-CM | POA: Diagnosis not present

## 2020-02-22 DIAGNOSIS — E785 Hyperlipidemia, unspecified: Secondary | ICD-10-CM | POA: Diagnosis not present

## 2020-02-22 DIAGNOSIS — E119 Type 2 diabetes mellitus without complications: Secondary | ICD-10-CM | POA: Diagnosis not present

## 2020-02-22 DIAGNOSIS — I2510 Atherosclerotic heart disease of native coronary artery without angina pectoris: Secondary | ICD-10-CM | POA: Diagnosis not present

## 2020-02-22 DIAGNOSIS — E538 Deficiency of other specified B group vitamins: Secondary | ICD-10-CM | POA: Diagnosis not present

## 2020-02-22 DIAGNOSIS — Z7982 Long term (current) use of aspirin: Secondary | ICD-10-CM | POA: Diagnosis not present

## 2020-02-28 DIAGNOSIS — I6523 Occlusion and stenosis of bilateral carotid arteries: Secondary | ICD-10-CM | POA: Diagnosis not present

## 2020-02-28 DIAGNOSIS — I10 Essential (primary) hypertension: Secondary | ICD-10-CM | POA: Diagnosis not present

## 2020-02-28 DIAGNOSIS — I2510 Atherosclerotic heart disease of native coronary artery without angina pectoris: Secondary | ICD-10-CM | POA: Diagnosis not present

## 2020-02-28 DIAGNOSIS — E782 Mixed hyperlipidemia: Secondary | ICD-10-CM | POA: Diagnosis not present

## 2020-02-28 DIAGNOSIS — R002 Palpitations: Secondary | ICD-10-CM | POA: Diagnosis not present

## 2020-03-04 ENCOUNTER — Other Ambulatory Visit: Payer: Self-pay | Admitting: Internal Medicine

## 2020-04-03 DIAGNOSIS — I517 Cardiomegaly: Secondary | ICD-10-CM | POA: Diagnosis not present

## 2020-04-03 DIAGNOSIS — Z87891 Personal history of nicotine dependence: Secondary | ICD-10-CM | POA: Diagnosis not present

## 2020-04-03 DIAGNOSIS — R0981 Nasal congestion: Secondary | ICD-10-CM | POA: Diagnosis not present

## 2020-04-03 DIAGNOSIS — R29898 Other symptoms and signs involving the musculoskeletal system: Secondary | ICD-10-CM | POA: Diagnosis not present

## 2020-04-03 DIAGNOSIS — R05 Cough: Secondary | ICD-10-CM | POA: Diagnosis not present

## 2020-05-16 ENCOUNTER — Other Ambulatory Visit: Payer: Self-pay | Admitting: Internal Medicine

## 2020-05-16 NOTE — Telephone Encounter (Signed)
Please refill as per office routine med refill policy (all routine meds refilled for 3 mo or monthly per pt preference up to one year from last visit, then month to month grace period for 3 mo, then further med refills will have to be denied)  

## 2020-06-02 DIAGNOSIS — I6523 Occlusion and stenosis of bilateral carotid arteries: Secondary | ICD-10-CM | POA: Diagnosis not present

## 2020-06-02 DIAGNOSIS — I2510 Atherosclerotic heart disease of native coronary artery without angina pectoris: Secondary | ICD-10-CM | POA: Diagnosis not present

## 2020-06-02 DIAGNOSIS — E782 Mixed hyperlipidemia: Secondary | ICD-10-CM | POA: Diagnosis not present

## 2020-06-02 DIAGNOSIS — I10 Essential (primary) hypertension: Secondary | ICD-10-CM | POA: Diagnosis not present

## 2020-06-17 DIAGNOSIS — I2510 Atherosclerotic heart disease of native coronary artery without angina pectoris: Secondary | ICD-10-CM | POA: Diagnosis not present

## 2020-06-17 DIAGNOSIS — E782 Mixed hyperlipidemia: Secondary | ICD-10-CM | POA: Diagnosis not present

## 2020-06-17 DIAGNOSIS — I6523 Occlusion and stenosis of bilateral carotid arteries: Secondary | ICD-10-CM | POA: Diagnosis not present

## 2020-06-17 DIAGNOSIS — J3089 Other allergic rhinitis: Secondary | ICD-10-CM | POA: Diagnosis not present

## 2020-06-17 DIAGNOSIS — I10 Essential (primary) hypertension: Secondary | ICD-10-CM | POA: Diagnosis not present

## 2020-06-17 DIAGNOSIS — E119 Type 2 diabetes mellitus without complications: Secondary | ICD-10-CM | POA: Diagnosis not present

## 2020-07-01 DIAGNOSIS — E785 Hyperlipidemia, unspecified: Secondary | ICD-10-CM | POA: Diagnosis not present

## 2020-07-01 DIAGNOSIS — M79641 Pain in right hand: Secondary | ICD-10-CM | POA: Diagnosis not present

## 2020-07-01 DIAGNOSIS — K5900 Constipation, unspecified: Secondary | ICD-10-CM | POA: Diagnosis not present

## 2020-07-01 DIAGNOSIS — E119 Type 2 diabetes mellitus without complications: Secondary | ICD-10-CM | POA: Diagnosis not present

## 2020-07-01 DIAGNOSIS — I10 Essential (primary) hypertension: Secondary | ICD-10-CM | POA: Diagnosis not present

## 2020-07-01 DIAGNOSIS — J449 Chronic obstructive pulmonary disease, unspecified: Secondary | ICD-10-CM | POA: Diagnosis not present

## 2020-07-01 DIAGNOSIS — M79642 Pain in left hand: Secondary | ICD-10-CM | POA: Diagnosis not present

## 2020-07-01 DIAGNOSIS — I2510 Atherosclerotic heart disease of native coronary artery without angina pectoris: Secondary | ICD-10-CM | POA: Diagnosis not present

## 2020-08-04 DIAGNOSIS — G5603 Carpal tunnel syndrome, bilateral upper limbs: Secondary | ICD-10-CM | POA: Diagnosis not present

## 2020-08-13 ENCOUNTER — Other Ambulatory Visit: Payer: Self-pay | Admitting: Internal Medicine

## 2020-08-13 NOTE — Telephone Encounter (Signed)
Please refill as per office routine med refill policy (all routine meds refilled for 3 mo or monthly per pt preference up to one year from last visit, then month to month grace period for 3 mo, then further med refills will have to be denied)  

## 2020-08-15 DIAGNOSIS — Z1212 Encounter for screening for malignant neoplasm of rectum: Secondary | ICD-10-CM | POA: Diagnosis not present

## 2020-08-15 DIAGNOSIS — Z1211 Encounter for screening for malignant neoplasm of colon: Secondary | ICD-10-CM | POA: Diagnosis not present

## 2020-08-26 DIAGNOSIS — H2513 Age-related nuclear cataract, bilateral: Secondary | ICD-10-CM | POA: Diagnosis not present

## 2020-08-27 LAB — COLOGUARD: COLOGUARD: NEGATIVE

## 2020-09-16 DIAGNOSIS — M79641 Pain in right hand: Secondary | ICD-10-CM | POA: Diagnosis not present

## 2020-09-16 DIAGNOSIS — M79642 Pain in left hand: Secondary | ICD-10-CM | POA: Diagnosis not present

## 2020-09-24 DIAGNOSIS — R7303 Prediabetes: Secondary | ICD-10-CM | POA: Diagnosis not present

## 2020-09-24 DIAGNOSIS — I10 Essential (primary) hypertension: Secondary | ICD-10-CM | POA: Diagnosis not present

## 2020-09-24 DIAGNOSIS — E782 Mixed hyperlipidemia: Secondary | ICD-10-CM | POA: Diagnosis not present

## 2020-09-24 DIAGNOSIS — I2510 Atherosclerotic heart disease of native coronary artery without angina pectoris: Secondary | ICD-10-CM | POA: Diagnosis not present

## 2020-10-01 DIAGNOSIS — I10 Essential (primary) hypertension: Secondary | ICD-10-CM | POA: Diagnosis not present

## 2020-10-01 DIAGNOSIS — I2510 Atherosclerotic heart disease of native coronary artery without angina pectoris: Secondary | ICD-10-CM | POA: Diagnosis not present

## 2020-10-01 DIAGNOSIS — E785 Hyperlipidemia, unspecified: Secondary | ICD-10-CM | POA: Diagnosis not present

## 2020-10-01 DIAGNOSIS — E119 Type 2 diabetes mellitus without complications: Secondary | ICD-10-CM | POA: Diagnosis not present

## 2020-10-02 DIAGNOSIS — R200 Anesthesia of skin: Secondary | ICD-10-CM | POA: Insufficient documentation

## 2020-10-08 ENCOUNTER — Other Ambulatory Visit: Payer: Self-pay | Admitting: Internal Medicine

## 2020-10-08 NOTE — Telephone Encounter (Signed)
Sorry unable to refill further due to office refill policy  Pt last seen aug 2020

## 2020-10-21 ENCOUNTER — Other Ambulatory Visit: Payer: Self-pay | Admitting: Orthopedic Surgery

## 2020-11-18 ENCOUNTER — Inpatient Hospital Stay: Admission: RE | Admit: 2020-11-18 | Payer: Medicare HMO | Source: Ambulatory Visit

## 2020-11-18 ENCOUNTER — Other Ambulatory Visit: Payer: Self-pay | Admitting: Internal Medicine

## 2020-11-18 NOTE — Telephone Encounter (Signed)
Please refill as per office routine med refill policy (all routine meds refilled for 3 mo or monthly per pt preference up to one year from last visit, then month to month grace period for 3 mo, then further med refills will have to be denied)  

## 2020-11-20 ENCOUNTER — Other Ambulatory Visit: Payer: Self-pay | Admitting: Orthopedic Surgery

## 2020-11-20 ENCOUNTER — Other Ambulatory Visit (HOSPITAL_COMMUNITY): Payer: Self-pay | Admitting: Orthopedic Surgery

## 2020-11-20 DIAGNOSIS — G8929 Other chronic pain: Secondary | ICD-10-CM

## 2020-11-20 DIAGNOSIS — M5442 Lumbago with sciatica, left side: Secondary | ICD-10-CM

## 2020-11-20 DIAGNOSIS — M5441 Lumbago with sciatica, right side: Secondary | ICD-10-CM

## 2020-11-20 DIAGNOSIS — M4807 Spinal stenosis, lumbosacral region: Secondary | ICD-10-CM | POA: Diagnosis not present

## 2020-11-20 DIAGNOSIS — J449 Chronic obstructive pulmonary disease, unspecified: Secondary | ICD-10-CM | POA: Diagnosis not present

## 2020-11-20 DIAGNOSIS — G9519 Other vascular myelopathies: Secondary | ICD-10-CM | POA: Diagnosis not present

## 2020-11-20 DIAGNOSIS — M5136 Other intervertebral disc degeneration, lumbar region: Secondary | ICD-10-CM | POA: Diagnosis not present

## 2020-11-20 DIAGNOSIS — M47816 Spondylosis without myelopathy or radiculopathy, lumbar region: Secondary | ICD-10-CM | POA: Diagnosis not present

## 2020-11-21 ENCOUNTER — Other Ambulatory Visit: Admission: RE | Admit: 2020-11-21 | Payer: Medicare HMO | Source: Ambulatory Visit

## 2020-11-25 ENCOUNTER — Ambulatory Visit: Admit: 2020-11-25 | Payer: Medicare HMO | Admitting: Orthopedic Surgery

## 2020-11-25 SURGERY — CARPAL TUNNEL RELEASE
Anesthesia: Choice | Laterality: Right

## 2020-11-27 DIAGNOSIS — E782 Mixed hyperlipidemia: Secondary | ICD-10-CM | POA: Diagnosis not present

## 2020-11-27 DIAGNOSIS — I6523 Occlusion and stenosis of bilateral carotid arteries: Secondary | ICD-10-CM | POA: Diagnosis not present

## 2020-11-27 DIAGNOSIS — I25118 Atherosclerotic heart disease of native coronary artery with other forms of angina pectoris: Secondary | ICD-10-CM | POA: Diagnosis not present

## 2020-11-27 DIAGNOSIS — I2510 Atherosclerotic heart disease of native coronary artery without angina pectoris: Secondary | ICD-10-CM | POA: Diagnosis not present

## 2020-11-27 DIAGNOSIS — R002 Palpitations: Secondary | ICD-10-CM | POA: Diagnosis not present

## 2020-11-27 DIAGNOSIS — I10 Essential (primary) hypertension: Secondary | ICD-10-CM | POA: Diagnosis not present

## 2020-11-30 ENCOUNTER — Ambulatory Visit
Admission: RE | Admit: 2020-11-30 | Discharge: 2020-11-30 | Disposition: A | Discharge status: Home / Self Care | Payer: Medicare HMO | Source: Ambulatory Visit | Attending: Orthopedic Surgery | Admitting: Orthopedic Surgery

## 2020-11-30 ENCOUNTER — Other Ambulatory Visit: Payer: Self-pay

## 2020-11-30 DIAGNOSIS — M545 Low back pain, unspecified: Secondary | ICD-10-CM | POA: Diagnosis not present

## 2020-11-30 DIAGNOSIS — G8929 Other chronic pain: Secondary | ICD-10-CM | POA: Insufficient documentation

## 2020-11-30 DIAGNOSIS — M5442 Lumbago with sciatica, left side: Secondary | ICD-10-CM | POA: Diagnosis not present

## 2020-11-30 DIAGNOSIS — M5441 Lumbago with sciatica, right side: Secondary | ICD-10-CM | POA: Diagnosis not present

## 2020-12-25 DIAGNOSIS — M5416 Radiculopathy, lumbar region: Secondary | ICD-10-CM | POA: Diagnosis not present

## 2020-12-25 DIAGNOSIS — M48062 Spinal stenosis, lumbar region with neurogenic claudication: Secondary | ICD-10-CM | POA: Diagnosis not present

## 2020-12-31 DIAGNOSIS — I10 Essential (primary) hypertension: Secondary | ICD-10-CM | POA: Diagnosis not present

## 2020-12-31 DIAGNOSIS — E119 Type 2 diabetes mellitus without complications: Secondary | ICD-10-CM | POA: Diagnosis not present

## 2020-12-31 DIAGNOSIS — I2510 Atherosclerotic heart disease of native coronary artery without angina pectoris: Secondary | ICD-10-CM | POA: Diagnosis not present

## 2020-12-31 DIAGNOSIS — N400 Enlarged prostate without lower urinary tract symptoms: Secondary | ICD-10-CM | POA: Diagnosis not present

## 2020-12-31 DIAGNOSIS — E782 Mixed hyperlipidemia: Secondary | ICD-10-CM | POA: Diagnosis not present

## 2020-12-31 DIAGNOSIS — E785 Hyperlipidemia, unspecified: Secondary | ICD-10-CM | POA: Diagnosis not present

## 2020-12-31 DIAGNOSIS — E538 Deficiency of other specified B group vitamins: Secondary | ICD-10-CM | POA: Diagnosis not present

## 2021-01-29 DIAGNOSIS — M48061 Spinal stenosis, lumbar region without neurogenic claudication: Secondary | ICD-10-CM | POA: Diagnosis not present

## 2021-01-29 DIAGNOSIS — M5416 Radiculopathy, lumbar region: Secondary | ICD-10-CM | POA: Diagnosis not present

## 2021-03-30 DIAGNOSIS — G5601 Carpal tunnel syndrome, right upper limb: Secondary | ICD-10-CM | POA: Diagnosis not present

## 2021-03-30 DIAGNOSIS — G8929 Other chronic pain: Secondary | ICD-10-CM | POA: Diagnosis not present

## 2021-03-30 DIAGNOSIS — M47816 Spondylosis without myelopathy or radiculopathy, lumbar region: Secondary | ICD-10-CM | POA: Diagnosis not present

## 2021-03-30 DIAGNOSIS — G9519 Other vascular myelopathies: Secondary | ICD-10-CM | POA: Diagnosis not present

## 2021-03-30 DIAGNOSIS — M48062 Spinal stenosis, lumbar region with neurogenic claudication: Secondary | ICD-10-CM | POA: Diagnosis not present

## 2021-03-30 DIAGNOSIS — M5441 Lumbago with sciatica, right side: Secondary | ICD-10-CM | POA: Diagnosis not present

## 2021-03-30 DIAGNOSIS — M5442 Lumbago with sciatica, left side: Secondary | ICD-10-CM | POA: Diagnosis not present

## 2021-03-30 DIAGNOSIS — M5136 Other intervertebral disc degeneration, lumbar region: Secondary | ICD-10-CM | POA: Diagnosis not present

## 2021-04-27 ENCOUNTER — Encounter: Payer: Self-pay | Admitting: Internal Medicine

## 2021-04-27 ENCOUNTER — Other Ambulatory Visit: Payer: Self-pay

## 2021-04-27 ENCOUNTER — Ambulatory Visit (INDEPENDENT_AMBULATORY_CARE_PROVIDER_SITE_OTHER): Payer: Medicare HMO | Admitting: Internal Medicine

## 2021-04-27 VITALS — BP 150/70 | HR 56 | Temp 97.9°F | Wt 206.4 lb

## 2021-04-27 DIAGNOSIS — E782 Mixed hyperlipidemia: Secondary | ICD-10-CM | POA: Diagnosis not present

## 2021-04-27 DIAGNOSIS — M25561 Pain in right knee: Secondary | ICD-10-CM | POA: Diagnosis not present

## 2021-04-27 DIAGNOSIS — Z1159 Encounter for screening for other viral diseases: Secondary | ICD-10-CM

## 2021-04-27 DIAGNOSIS — I10 Essential (primary) hypertension: Secondary | ICD-10-CM | POA: Diagnosis not present

## 2021-04-27 DIAGNOSIS — E538 Deficiency of other specified B group vitamins: Secondary | ICD-10-CM

## 2021-04-27 DIAGNOSIS — Z860100 Personal history of colon polyps, unspecified: Secondary | ICD-10-CM

## 2021-04-27 DIAGNOSIS — E559 Vitamin D deficiency, unspecified: Secondary | ICD-10-CM | POA: Diagnosis not present

## 2021-04-27 DIAGNOSIS — E1165 Type 2 diabetes mellitus with hyperglycemia: Secondary | ICD-10-CM

## 2021-04-27 DIAGNOSIS — Z0001 Encounter for general adult medical examination with abnormal findings: Secondary | ICD-10-CM | POA: Diagnosis not present

## 2021-04-27 LAB — URINALYSIS, ROUTINE W REFLEX MICROSCOPIC
Bilirubin Urine: NEGATIVE
Hgb urine dipstick: NEGATIVE
Leukocytes,Ua: NEGATIVE
Nitrite: NEGATIVE
RBC / HPF: NONE SEEN (ref 0–?)
Specific Gravity, Urine: 1.02 (ref 1.000–1.030)
Total Protein, Urine: NEGATIVE
Urine Glucose: NEGATIVE
Urobilinogen, UA: 0.2 (ref 0.0–1.0)
pH: 6.5 (ref 5.0–8.0)

## 2021-04-27 LAB — HEPATIC FUNCTION PANEL
ALT: 19 U/L (ref 0–53)
AST: 23 U/L (ref 0–37)
Albumin: 4.1 g/dL (ref 3.5–5.2)
Alkaline Phosphatase: 56 U/L (ref 39–117)
Bilirubin, Direct: 0.1 mg/dL (ref 0.0–0.3)
Total Bilirubin: 0.5 mg/dL (ref 0.2–1.2)
Total Protein: 6.8 g/dL (ref 6.0–8.3)

## 2021-04-27 LAB — CBC WITH DIFFERENTIAL/PLATELET
Basophils Absolute: 0 10*3/uL (ref 0.0–0.1)
Basophils Relative: 0.6 % (ref 0.0–3.0)
Eosinophils Absolute: 0.1 10*3/uL (ref 0.0–0.7)
Eosinophils Relative: 2.1 % (ref 0.0–5.0)
HCT: 38.6 % — ABNORMAL LOW (ref 39.0–52.0)
Hemoglobin: 13.1 g/dL (ref 13.0–17.0)
Lymphocytes Relative: 40.3 % (ref 12.0–46.0)
Lymphs Abs: 2.2 10*3/uL (ref 0.7–4.0)
MCHC: 33.9 g/dL (ref 30.0–36.0)
MCV: 88.4 fl (ref 78.0–100.0)
Monocytes Absolute: 0.6 10*3/uL (ref 0.1–1.0)
Monocytes Relative: 10.4 % (ref 3.0–12.0)
Neutro Abs: 2.6 10*3/uL (ref 1.4–7.7)
Neutrophils Relative %: 46.6 % (ref 43.0–77.0)
Platelets: 180 10*3/uL (ref 150.0–400.0)
RBC: 4.36 Mil/uL (ref 4.22–5.81)
RDW: 14.2 % (ref 11.5–15.5)
WBC: 5.6 10*3/uL (ref 4.0–10.5)

## 2021-04-27 LAB — TSH: TSH: 2.36 u[IU]/mL (ref 0.35–5.50)

## 2021-04-27 LAB — BASIC METABOLIC PANEL
BUN: 17 mg/dL (ref 6–23)
CO2: 34 mEq/L — ABNORMAL HIGH (ref 19–32)
Calcium: 9.9 mg/dL (ref 8.4–10.5)
Chloride: 103 mEq/L (ref 96–112)
Creatinine, Ser: 1.12 mg/dL (ref 0.40–1.50)
GFR: 62.55 mL/min (ref >60.00)
Glucose, Bld: 99 mg/dL (ref 70–99)
Potassium: 3.5 mEq/L (ref 3.5–5.1)
Sodium: 144 mEq/L (ref 135–145)

## 2021-04-27 LAB — LIPID PANEL
Cholesterol: 133 mg/dL (ref 0–200)
HDL: 33.5 mg/dL — ABNORMAL LOW (ref >39.00)
NonHDL: 99.78
Total CHOL/HDL Ratio: 4
Triglycerides: 251 mg/dL — ABNORMAL HIGH (ref 0.0–149.0)
VLDL: 50.2 mg/dL — ABNORMAL HIGH (ref 0.0–40.0)

## 2021-04-27 LAB — LDL CHOLESTEROL, DIRECT: Direct LDL: 59 mg/dL

## 2021-04-27 LAB — MICROALBUMIN / CREATININE URINE RATIO
Creatinine,U: 152.2 mg/dL
Microalb Creat Ratio: 0.7 mg/g (ref 0.0–30.0)
Microalb, Ur: 1.1 mg/dL (ref 0.0–1.9)

## 2021-04-27 LAB — HEMOGLOBIN A1C: Hgb A1c MFr Bld: 6.3 % (ref 4.6–6.5)

## 2021-04-27 LAB — PSA: PSA: 0.38 ng/mL (ref 0.10–4.00)

## 2021-04-27 LAB — VITAMIN D 25 HYDROXY (VIT D DEFICIENCY, FRACTURES): VITD: 22.51 ng/mL — ABNORMAL LOW (ref 30.00–100.00)

## 2021-04-27 LAB — VITAMIN B12: Vitamin B-12: 674 pg/mL (ref 211–911)

## 2021-04-27 MED ORDER — TRAMADOL-ACETAMINOPHEN 37.5-325 MG PO TABS: 1.0000 | ORAL_TABLET | Freq: Four times a day (QID) | ORAL | 2 refills | Status: DC | PRN

## 2021-04-27 MED ORDER — AMLODIPINE BESYLATE 5 MG PO TABS: 5.0000 mg | ORAL_TABLET | Freq: Every day | ORAL | 1 refills | Status: DC

## 2021-04-27 MED ORDER — PREDNISONE 10 MG PO TABS: ORAL_TABLET | ORAL | 0 refills | Status: DC

## 2021-04-27 NOTE — Assessment & Plan Note (Signed)
Lab Results  Component Value Date   LDLCALC 71 12/07/2018   Mild uncontrolled, goal ldl < 70, pt to continue current statin crestor 10 as declines change for now, for f/u lipids

## 2021-04-27 NOTE — Assessment & Plan Note (Signed)
Lab Results  Component Value Date   HGBA1C 6.3 04/27/2021   Stable, pt to continue current medical treatment  - diet

## 2021-04-27 NOTE — Progress Notes (Signed)
Patient ID: Travis Carey, male   DOB: [birth year], 80 y.o.   MRN: [id number]         Chief Complaint:: wellness exam and hemorrhoid bleeding, right knee pain, hx CVA and HTN       HPI:  Travis Carey is a 80 y.o. male here for wellness exam; declines covid booster, shingrix; and due for Hep C screen and colonoscopy, o/w up to date with preventive referrals and immunizations                        Also has 2 wks sudden worsening right knee pain, moderate, constant, worse to walk, better to sit, no fever or trauma, giveaways or falls. Better with prednisone previously, hoping for a repeat thinking it may be gout again.  Also with incidental BRBPR small volume x 1 last pm, none today, asks for f/u colonoscopy as is due anyway.  Denies worsening reflux, abd pain, dysphagia, n/v, bowel change or blood.   Pt denies polydipsia, polyuria, or new focal neuro s/s.  BP has been < 140/90 at home..    Wt Readings from Last 3 Encounters:  04/27/21 206 lb 6.4 oz (93.6 kg)  06/07/19 201 lb (91.2 kg)  12/07/18 203 lb (92.1 kg)   BP Readings from Last 3 Encounters:  04/27/21 (!) 150/70  06/07/19 122/78  12/07/18 (!) 144/84   Immunization History  Administered Date(s) Administered   Influenza Split 07/27/2011   Influenza Whole 08/09/1997, 08/28/2009, 07/28/2010   Influenza, High Dose Seasonal PF 08/11/2017, 08/17/2018   Influenza,inj,Quad PF,6+ Mos 06/12/2014, 08/07/2015   Influenza-Unspecified 06/11/2013   PFIZER(Purple Top)SARS-COV-2 Vaccination 01/30/2020, 02/26/2020, 10/01/2020   Pneumococcal Conjugate-13 11/01/2013   Pneumococcal Polysaccharide-23 08/19/2008   Td 08/19/2008   Tdap 12/07/2018   Zoster, Live 01/31/2013   Health Maintenance Due  Topic Date Due   Hepatitis C Screening  Never done   COLONOSCOPY (Pts 45-25yrs Insurance coverage will need to be confirmed)  10/11/2017      Past Medical History:  Diagnosis Date   Allergic rhinitis, cause unspecified 01/31/2013   ANEMIA-NOS 01/28/2010    BPH (benign prostatic hypertrophy) 04/25/2013   CAD 02/17/2010   CAROTID BRUIT 11/18/2009   Carpal tunnel syndrome 05/20/2008   Cervical radiculitis 04/17/2012   CHEST PAIN 09/26/2009   DEPRESSION 06/12/2007   DYSPNEA 07/28/2010   ERECTILE DYSFUNCTION 02/24/2009   GERD 06/09/2007   GERD (gastroesophageal reflux disease)    GLUCOSE INTOLERANCE 01/28/2010   Gout 2017   Dr.Kelsha Older   HEMORRHOIDS 06/09/2007   History of colonic polyps 08/07/2015   HYPERLIPIDEMIA 02/07/2008   HYPERTENSION 06/09/2007   HYPOKALEMIA 01/28/2010   Impaired glucose tolerance 01/23/2011   LOW BACK PAIN 06/12/2007   MYOCARDIAL PERFUSION SCAN, WITH STRESS TEST, ABNORMAL 10/08/2009   PARESTHESIA 04/25/2008   SHOULDER PAIN, LEFT 04/25/2008   URI 08/19/2008   VITAMIN B12 DEFICIENCY 02/09/2010   Past Surgical History:  Procedure Laterality Date   COLONOSCOPY     CORONARY STENT PLACEMENT     from right radial    reports that he has never smoked. He has never used smokeless tobacco. He reports that he does not drink alcohol and does not use drugs. family history includes Alcohol abuse in his brother and sister; Coronary artery disease in an other family member; Heart attack in an other family member; Heart disease in his father and mother; Hyperlipidemia in his father and mother; Hypertension in his father, mother, and another family member; Stroke  in an other family member. Allergies  Allergen Reactions   Diclofenac Sodium     REACTION: maks pt drowsey   Current Outpatient Medications on File Prior to Visit  Medication Sig Dispense Refill   aspirin EC 81 MG tablet Take 81 mg by mouth daily.     fexofenadine (ALLEGRA) 180 MG tablet Take 1 tablet (180 mg total) by mouth daily. As needed (Patient taking differently: Take 180 mg by mouth daily as needed for allergies.) 90 tablet 3   fluticasone (FLONASE) 50 MCG/ACT nasal spray USE TWO SPRAYS IN EACH NOSTRIL ONCE DAILY 48 g 5   lisinopril-hydrochlorothiazide (ZESTORETIC) 20-12.5 MG  tablet Take 1 tablet by mouth once daily 90 tablet 3   meloxicam (MOBIC) 7.5 MG tablet TAKE 1 TO 2 TABLET(S) BY MOUTH ONCE DAILY AS NEEDED FOR PAIN 180 tablet 0   potassium chloride (K-DUR) 10 MEQ tablet Take 2 tablets (20 mEq total) by mouth daily. 180 tablet 3   rosuvastatin (CRESTOR) 10 MG tablet TAKE 1 TABLET BY MOUTH ONCE DAILY. ANNUAL APPOINTMENT IS DUE. MUST SEE PROVIDER FOR FURTHER REFILLS. 30 tablet 0   tamsulosin (FLOMAX) 0.4 MG CAPS capsule TAKE 1 CAPSULE BY MOUTH ONCE DAILY 90 capsule 1   carvedilol (COREG) 25 MG tablet Take 1 tablet (25 mg total) by mouth 2 (two) times daily with a meal. Must keep scheduled appt for March 3rd for future refills 30 tablet 0   colchicine 0.6 MG tablet Take 1 tablet (0.6 mg total) by mouth daily. (Patient not taking: Reported on 04/27/2021) 90 tablet 3   dexlansoprazole (DEXILANT) 60 MG capsule Take 1 capsule (60 mg total) by mouth daily. (Patient not taking: Reported on 04/27/2021) 90 capsule 3   diclofenac sodium (VOLTAREN) 1 % GEL Apply 2 g topically 4 (four) times daily as needed. (Patient not taking: Reported on 04/27/2021) 100 g 5   doxycycline (VIBRA-TABS) 100 MG tablet Take 1 tablet (100 mg total) by mouth 2 (two) times daily. (Patient not taking: Reported on 04/27/2021) 20 tablet 0   gabapentin (NEURONTIN) 100 MG capsule TAKE 2 CAPSULES BY MOUTH THREE TIMES DAILY (Patient not taking: Reported on 04/27/2021) 180 capsule 1   HYDROcodone-acetaminophen (NORCO/VICODIN) 5-325 MG tablet Take 1 tablet by mouth every 6 (six) hours as needed for moderate pain. (Patient not taking: Reported on 04/27/2021) 30 tablet 0   methylPREDNISolone (MEDROL DOSEPAK) 4 MG TBPK tablet Take Tapered dose as directed (Patient not taking: Reported on 04/27/2021) 21 tablet 0   pantoprazole (PROTONIX) 40 MG tablet Take 1 tablet (40 mg total) by mouth daily. 90 tablet 3   sulfamethoxazole-trimethoprim (BACTRIM DS,SEPTRA DS) 800-160 MG tablet Take 1 tablet by mouth 2 (two) times daily.  (Patient not taking: Reported on 04/27/2021) 20 tablet 0   No current facility-administered medications on file prior to visit.        ROS:  All others reviewed and negative.  Objective        PE:  BP (!) 150/70 (BP Location: Left Arm, Patient Position: Sitting, Cuff Size: Normal)   Pulse (!) 56   Temp 97.9 F (36.6 C) (Oral)   Wt 206 lb 6.4 oz (93.6 kg)   SpO2 97%   BMI 31.38 kg/m                 Constitutional: Pt appears in NAD               HENT: Head: NCAT.  Right Ear: External ear normal.                 Left Ear: External ear normal.                Eyes: . Pupils are equal, round, and reactive to light. Conjunctivae and EOM are normal               Nose: without d/c or deformity               Neck: Neck supple. Gross normal ROM               Cardiovascular: Normal rate and regular rhythm.                 Pulmonary/Chest: Effort normal and breath sounds without rales or wheezing.                Abd:  Soft, NT, ND, + BS, no organomegaly               Neurological: Pt is alert. At baseline orientation, motor grossly intact; right knee with 2+ tender effusion and decrased ROM               Skin: Skin is warm. No rashes, no other new lesions, LE edema - none               Psychiatric: Pt behavior is normal without agitation   Micro: none  Cardiac tracings I have personally interpreted today:  none  Pertinent Radiological findings (summarize): none   Lab Results  Component Value Date   WBC 5.6 04/27/2021   HGB 13.1 04/27/2021   HCT 38.6 (L) 04/27/2021   PLT 180.0 04/27/2021   GLUCOSE 99 04/27/2021   CHOL 133 04/27/2021   TRIG 251.0 (H) 04/27/2021   HDL 33.50 (L) 04/27/2021   LDLDIRECT 59.0 04/27/2021   LDLCALC 71 12/07/2018   ALT 19 04/27/2021   AST 23 04/27/2021   NA 144 04/27/2021   K 3.5 04/27/2021   CL 103 04/27/2021   CREATININE 1.12 04/27/2021   BUN 17 04/27/2021   CO2 34 (H) 04/27/2021   TSH 2.36 04/27/2021   PSA 0.38 04/27/2021   INR  1.0 ratio 10/23/2009   HGBA1C 6.3 04/27/2021   MICROALBUR 1.1 04/27/2021   Assessment/Plan:  Travis Carey is a 80 y.o. Black or African American [2] male with  has a past medical history of Allergic rhinitis, cause unspecified (01/31/2013), ANEMIA-NOS (01/28/2010), BPH (benign prostatic hypertrophy) (04/25/2013), CAD (02/17/2010), CAROTID BRUIT (11/18/2009), Carpal tunnel syndrome (05/20/2008), Cervical radiculitis (04/17/2012), CHEST PAIN (09/26/2009), DEPRESSION (06/12/2007), DYSPNEA (07/28/2010), ERECTILE DYSFUNCTION (02/24/2009), GERD (06/09/2007), GERD (gastroesophageal reflux disease), GLUCOSE INTOLERANCE (01/28/2010), Gout (2017), HEMORRHOIDS (06/09/2007), History of colonic polyps (08/07/2015), HYPERLIPIDEMIA (02/07/2008), HYPERTENSION (06/09/2007), HYPOKALEMIA (01/28/2010), Impaired glucose tolerance (01/23/2011), LOW BACK PAIN (06/12/2007), MYOCARDIAL PERFUSION SCAN, WITH STRESS TEST, ABNORMAL (10/08/2009), PARESTHESIA (04/25/2008), SHOULDER PAIN, LEFT (04/25/2008), URI (08/19/2008), and VITAMIN B12 DEFICIENCY (02/09/2010).  Encounter for well adult exam with abnormal findings Age and sex appropriate education and counseling updated with regular exercise and diet Referrals for preventative services - for colonoscopy, hep c screen Immunizations addressed - declines covid booster, shingrix Smoking counseling  - none needed Evidence for depression or other mood disorder - none significant Most recent labs reviewed. I have personally reviewed and have noted: 1) the patient's medical and social history 2) The patient's current medications and supplements 3) The patient's height, weight, and BMI have been recorded in the  chart   Right knee pain With effusion, cant r/o acute gouty arthritis, for predpac asd, ultracet prn, and volt gel prn, and to sport med if not improved  Hypertension BP Readings from Last 3 Encounters:  04/27/21 (!) 150/70  06/07/19 122/78  12/07/18 (!) 144/84   uncontroled here, pt state <  140/90 at home pt to continue medical treatment norvasc, coreg   Hyperlipidemia Lab Results  Component Value Date   LDLCALC 71 12/07/2018   Mild uncontrolled, goal ldl < 70, pt to continue current statin crestor 10 as declines change for now, for f/u lipids   Diabetes type 2, uncontrolled Lab Results  Component Value Date   HGBA1C 6.3 04/27/2021   Stable, pt to continue current medical treatment  - diet   B12 deficiency Lab Results  Component Value Date   VITAMINB12 674 04/27/2021   Stable, cont oral replacement - b12 1000 mcg qd  History of colonic polyps For colnoscopy  Followup: Return in about 6 months (around 10/28/2021).  Cathlean Cower, MD 04/27/2021 9:36 PM [facility]

## 2021-04-27 NOTE — Assessment & Plan Note (Signed)
BP Readings from Last 3 Encounters:  04/27/21 (!) 150/70  06/07/19 122/78  12/07/18 (!) 144/84   uncontroled here, pt state < 140/90 at home pt to continue medical treatment norvasc, coreg

## 2021-04-27 NOTE — Assessment & Plan Note (Signed)
For colnoscopy

## 2021-04-27 NOTE — Assessment & Plan Note (Signed)
Lab Results  Component Value Date   VITAMINB12 674 04/27/2021   Stable, cont oral replacement - b12 1000 mcg qd

## 2021-04-27 NOTE — Assessment & Plan Note (Addendum)
With effusion, cant r/o acute gouty arthritis, for predpac asd, ultracet prn, and volt gel prn, and to sport med if not improved

## 2021-04-27 NOTE — Assessment & Plan Note (Signed)
Age and sex appropriate education and counseling updated with regular exercise and diet Referrals for preventative services - for colonoscopy, hep c screen Immunizations addressed - declines covid booster, shingrix Smoking counseling  - none needed Evidence for depression or other mood disorder - none significant Most recent labs reviewed. I have personally reviewed and have noted: 1) the patient's medical and social history 2) The patient's current medications and supplements 3) The patient's height, weight, and BMI have been recorded in the chart

## 2021-04-27 NOTE — Patient Instructions (Signed)
Ok to stop the plavix  Please take all new medication as prescribed - the prednisone  Ok to also take the [employer] (generic) for pain as needed, and the OTC Voltaren gel topical for the right knee  Please continue all other medications as before, and refills have been done if requested.  Please have the pharmacy call with any other refills you may need.  Please continue your efforts at being more active, low cholesterol diet, and weight control.  You are otherwise up to date with prevention measures today.  Please keep your appointments with your specialists as you may have planned  You will be contacted regarding the referral for: Colonoscopy  Please go to the LAB at the blood drawing area for the tests to be done  You will be contacted by phone if any changes need to be made immediately.  Otherwise, you will receive a letter about your results with an explanation, but please check with MyChart first.  Please remember to sign up for MyChart if you have not done so, as this will be important to you in the future with finding out test results, communicating by private email, and scheduling acute appointments online when needed.  Please make an Appointment to return in 6 months, or sooner if needed

## 2021-04-28 ENCOUNTER — Telehealth: Payer: Self-pay

## 2021-04-28 LAB — HEPATITIS C ANTIBODY
Hepatitis C Ab: NONREACTIVE
SIGNAL TO CUT-OFF: 0.01 (ref <1.00)

## 2021-04-28 NOTE — Telephone Encounter (Signed)
We have a new NP, [employer], starting next week, but he will not be able to absorb any TOCs outside of [place] until later in the Fall.If the pt can wait until Oct or Nov he can also call [facility] at [phone] at that time.

## 2021-04-28 NOTE — Telephone Encounter (Signed)
If this patien tis okay withseening an NP they can call and schedule with Laverna Peace [phone]

## 2021-04-28 NOTE — Telephone Encounter (Signed)
Biagio Borg, MD  Lucinda Dell, RN Ameliah Baskins   "This patient Travis Carey [id number] saw me today as they were unsatisfied with their current PCP at a location closer to their home in [place].  They explained this was one time physicals, howevere, and would like to be transferred in care again closer to their home since it is too far to travel each time here.  They were asking for a [place] provider at [facility] or [facility].  I am at a loss here,  Is this something youcanhelp with or how to direct them?  thanks"

## 2021-04-30 NOTE — Telephone Encounter (Signed)
Pt's wife aware that TOC appt at [facility] location was made for 10/25 at 1030.  Given locations direct line to make changes as needed.  Wife verb understanding.

## 2021-05-06 ENCOUNTER — Other Ambulatory Visit: Payer: Self-pay | Admitting: Orthopedic Surgery

## 2021-05-25 ENCOUNTER — Ambulatory Visit (INDEPENDENT_AMBULATORY_CARE_PROVIDER_SITE_OTHER): Payer: Medicare HMO

## 2021-05-25 ENCOUNTER — Other Ambulatory Visit: Payer: Self-pay

## 2021-05-25 ENCOUNTER — Ambulatory Visit (INDEPENDENT_AMBULATORY_CARE_PROVIDER_SITE_OTHER): Payer: Medicare HMO | Admitting: Internal Medicine

## 2021-05-25 ENCOUNTER — Encounter: Payer: Self-pay | Admitting: Internal Medicine

## 2021-05-25 VITALS — BP 132/80 | HR 73 | Temp 98.8°F | Resp 18 | Ht 68.0 in | Wt 207.8 lb

## 2021-05-25 DIAGNOSIS — M25551 Pain in right hip: Secondary | ICD-10-CM | POA: Diagnosis not present

## 2021-05-25 DIAGNOSIS — M25561 Pain in right knee: Secondary | ICD-10-CM

## 2021-05-25 MED ORDER — METHYLPREDNISOLONE ACETATE 40 MG/ML IJ SUSP
40.0000 mg | Freq: Once | INTRAMUSCULAR | Status: AC
  Administered 2021-05-25: 40 mg via INTRAMUSCULAR

## 2021-05-25 NOTE — Progress Notes (Signed)
   Subjective:   Patient ID: Travis Carey, male    DOB: June 17, 1941, 80 y.o.   MRN: [id number]  HPI The patient is a 80 YO man coming in for pain in his right leg. Started 3 weeks ago with pain at the hip and the knee. Denies injury or overuse. Stiff with sitting for awhile. Pain worse at night. Some mild swelling in the leg.   Review of Systems  Constitutional: Negative.   HENT: Negative.    Eyes: Negative.   Respiratory:  Negative for cough, chest tightness and shortness of breath.   Cardiovascular:  Negative for chest pain, palpitations and leg swelling.  Gastrointestinal:  Negative for abdominal distention, abdominal pain, constipation, diarrhea, nausea and vomiting.  Musculoskeletal:  Positive for arthralgias and myalgias.  Skin: Negative.   Neurological: Negative.   Psychiatric/Behavioral: Negative.     Objective:  Physical Exam Constitutional:      Appearance: He is well-developed.  HENT:     Head: Normocephalic and atraumatic.  Cardiovascular:     Rate and Rhythm: Normal rate and regular rhythm.  Pulmonary:     Effort: Pulmonary effort is normal. No respiratory distress.     Breath sounds: Normal breath sounds. No wheezing or rales.  Abdominal:     General: Bowel sounds are normal. There is no distension.     Palpations: Abdomen is soft.     Tenderness: There is no abdominal tenderness. There is no rebound.  Musculoskeletal:        General: Tenderness present.     Cervical back: Normal range of motion.  Skin:    General: Skin is warm and dry.  Neurological:     Mental Status: He is alert and oriented to person, place, and time.     Coordination: Coordination normal.    Vitals:   05/25/21 1312  BP: 132/80  Pulse: 73  Resp: 18  Temp: 98.8 F (37.1 C)  TempSrc: Oral  SpO2: 98%  Weight: 207 lb 12.8 oz (94.3 kg)  Height: '5\' 8"'$  (1.727 m)    This visit occurred during the SARS-CoV-2 public health emergency.  Safety protocols were in place, including screening  questions prior to the visit, additional usage of staff PPE, and extensive cleaning of exam room while observing appropriate contact time as indicated for disinfecting solutions.   Assessment & Plan:  Depo-medrol 40 mg IM given at visit

## 2021-05-25 NOTE — Assessment & Plan Note (Signed)
Could be trochanteric bursitis or lumbar radiculopathy. Checking x-ray right hip and given depo-medrol 40 mg IM.

## 2021-05-25 NOTE — Patient Instructions (Signed)
We have given you a shot today and will check the x-rays.

## 2021-05-25 NOTE — Assessment & Plan Note (Signed)
Checking x-ray right knee. Given depo-medrol 40 mg IM during visit.

## 2021-05-27 ENCOUNTER — Telehealth: Payer: Self-pay | Admitting: Internal Medicine

## 2021-05-27 NOTE — Telephone Encounter (Signed)
Calling in to advice they never received phone call from gastro referral to schedule appt for colonoscopy  Patient wife says they have transportation issues & would prefer if they could be referred to an office in [place] bc that is close to home   Please advise [phone]

## 2021-06-01 ENCOUNTER — Other Ambulatory Visit: Payer: Medicare HMO

## 2021-06-03 ENCOUNTER — Telehealth: Payer: Self-pay | Admitting: Internal Medicine

## 2021-06-03 NOTE — Telephone Encounter (Signed)
Pt's spouse called back regarding the results of x-rays. Relayed that Travis Carey had spoken to the pt regarding his results. She is requesting a callback to discuss possible treatments for his pain.  Best callback #: [phone]

## 2021-06-03 NOTE — Telephone Encounter (Signed)
No DPR on file. Unable to talk to the patient's wife without his consent. Called patient. Unable to get in contact with him due to the phone constantly ringing.

## 2021-06-09 ENCOUNTER — Ambulatory Visit: Admit: 2021-06-09 | Payer: Medicare HMO | Admitting: Orthopedic Surgery

## 2021-06-09 SURGERY — CARPAL TUNNEL RELEASE
Anesthesia: Choice | Laterality: Right

## 2021-06-19 ENCOUNTER — Other Ambulatory Visit: Payer: Self-pay

## 2021-06-19 ENCOUNTER — Ambulatory Visit (INDEPENDENT_AMBULATORY_CARE_PROVIDER_SITE_OTHER): Payer: Medicare HMO | Admitting: Internal Medicine

## 2021-06-19 ENCOUNTER — Encounter: Payer: Self-pay | Admitting: Internal Medicine

## 2021-06-19 VITALS — BP 148/70 | HR 58 | Temp 98.9°F | Ht 68.0 in | Wt 203.0 lb

## 2021-06-19 DIAGNOSIS — I10 Essential (primary) hypertension: Secondary | ICD-10-CM

## 2021-06-19 DIAGNOSIS — M79641 Pain in right hand: Secondary | ICD-10-CM

## 2021-06-19 DIAGNOSIS — M79605 Pain in left leg: Secondary | ICD-10-CM | POA: Diagnosis not present

## 2021-06-19 DIAGNOSIS — M79604 Pain in right leg: Secondary | ICD-10-CM | POA: Diagnosis not present

## 2021-06-19 DIAGNOSIS — M79642 Pain in left hand: Secondary | ICD-10-CM

## 2021-06-19 DIAGNOSIS — E1165 Type 2 diabetes mellitus with hyperglycemia: Secondary | ICD-10-CM | POA: Diagnosis not present

## 2021-06-19 MED ORDER — METHYLPREDNISOLONE ACETATE 80 MG/ML IJ SUSP
80.0000 mg | Freq: Once | INTRAMUSCULAR | Status: AC
  Administered 2021-06-19: 80 mg via INTRAMUSCULAR

## 2021-06-19 MED ORDER — HYDROCODONE-ACETAMINOPHEN 5-325 MG PO TABS: 1.0000 | ORAL_TABLET | Freq: Four times a day (QID) | ORAL | 0 refills | Status: DC | PRN

## 2021-06-19 MED ORDER — PREDNISONE 10 MG PO TABS: ORAL_TABLET | ORAL | 0 refills | Status: DC

## 2021-06-19 NOTE — Patient Instructions (Addendum)
You had the steroid shot today  Please take all new medication as prescribed- the prednisone  Please continue all other medications as before, and refills have been done if requested - the pain medication  Please have the pharmacy call with any other refills you may need.  Please keep your appointments with your specialists as you may have planned  Please go to the LAB at the blood drawing area for the tests to be done, to see about whether the pain may be inflammatory or not inflammatory  You will be contacted by phone if any changes need to be made immediately.  Otherwise, you will receive a letter about your results with an explanation, but please check with MyChart first.  Please remember to sign up for MyChart if you have not done so, as this will be important to you in the future with finding out test results, communicating by private email, and scheduling acute appointments online when needed.

## 2021-06-19 NOTE — Progress Notes (Signed)
Patient ID: Travis Carey, male   DOB: [birth year], 80 y.o.   MRN: [id number]        Chief Complaint: follow up bilateral hand pain, leg pain       HPI:  Travis Carey is a 80 y.o. male here with c/o worsening bilateral hand pain and swelling for several months, worse in the am with stiffness, wax and wane somewhat, better after using then hands in the AM, ow nothing else seems to make better or worse.  Has been tx for probable gout in past.  Had neg lab testing for RA in 2019. Last uric acid normal dec 2020.   Also c/o worsening bilateral leg pain that is not clearly neuritic it seems in that is seems more knee and hip related rather than lower back with radicular as before.  Pain is not exertional as well.  Pt denies chest pain, increased sob or doe, wheezing, orthopnea, PND, increased LE swelling, palpitations, dizziness or syncope.   Pt denies polydipsia, polyuria, or new focal neuro s/s.  .           Wt Readings from Last 3 Encounters:  06/19/21 203 lb (92.1 kg)  05/25/21 207 lb 12.8 oz (94.3 kg)  04/27/21 206 lb 6.4 oz (93.6 kg)   BP Readings from Last 3 Encounters:  06/19/21 (!) 148/70  05/25/21 132/80  04/27/21 (!) 150/70         Past Medical History:  Diagnosis Date   Allergic rhinitis, cause unspecified 01/31/2013   ANEMIA-NOS 01/28/2010   BPH (benign prostatic hypertrophy) 04/25/2013   CAD 02/17/2010   CAROTID BRUIT 11/18/2009   Carpal tunnel syndrome 05/20/2008   Cervical radiculitis 04/17/2012   CHEST PAIN 09/26/2009   DEPRESSION 06/12/2007   DYSPNEA 07/28/2010   ERECTILE DYSFUNCTION 02/24/2009   GERD 06/09/2007   GERD (gastroesophageal reflux disease)    GLUCOSE INTOLERANCE 01/28/2010   Gout 2017   Dr.Verginia Toohey   HEMORRHOIDS 06/09/2007   History of colonic polyps 08/07/2015   HYPERLIPIDEMIA 02/07/2008   HYPERTENSION 06/09/2007   HYPOKALEMIA 01/28/2010   Impaired glucose tolerance 01/23/2011   LOW BACK PAIN 06/12/2007   MYOCARDIAL PERFUSION SCAN, WITH STRESS TEST, ABNORMAL 10/08/2009    PARESTHESIA 04/25/2008   SHOULDER PAIN, LEFT 04/25/2008   URI 08/19/2008   VITAMIN B12 DEFICIENCY 02/09/2010   Past Surgical History:  Procedure Laterality Date   COLONOSCOPY     CORONARY STENT PLACEMENT     from right radial    reports that he has never smoked. He has never used smokeless tobacco. He reports that he does not drink alcohol and does not use drugs. family history includes Alcohol abuse in his brother and sister; Coronary artery disease in an other family member; Heart attack in an other family member; Heart disease in his father and mother; Hyperlipidemia in his father and mother; Hypertension in his father, mother, and another family member; Stroke in an other family member. Allergies  Allergen Reactions   Diclofenac Sodium     REACTION: maks pt drowsey   Current Outpatient Medications on File Prior to Visit  Medication Sig Dispense Refill   amLODipine (NORVASC) 5 MG tablet Take 1 tablet (5 mg total) by mouth daily. 90 tablet 1   aspirin EC 81 MG tablet Take 81 mg by mouth daily.     carvedilol (COREG) 25 MG tablet Take 1 tablet (25 mg total) by mouth 2 (two) times daily with a meal. Must keep scheduled appt for March 3rd for  future refills 30 tablet 0   colchicine 0.6 MG tablet Take 1 tablet (0.6 mg total) by mouth daily. 90 tablet 3   dexlansoprazole (DEXILANT) 60 MG capsule Take 1 capsule (60 mg total) by mouth daily. 90 capsule 3   diclofenac sodium (VOLTAREN) 1 % GEL Apply 2 g topically 4 (four) times daily as needed. 100 g 5   fexofenadine (ALLEGRA) 180 MG tablet Take 1 tablet (180 mg total) by mouth daily. As needed (Patient taking differently: Take 180 mg by mouth daily as needed for allergies.) 90 tablet 3   fluticasone (FLONASE) 50 MCG/ACT nasal spray USE TWO SPRAYS IN EACH NOSTRIL ONCE DAILY 48 g 5   gabapentin (NEURONTIN) 100 MG capsule TAKE 2 CAPSULES BY MOUTH THREE TIMES DAILY 180 capsule 1   lisinopril-hydrochlorothiazide (ZESTORETIC) 20-12.5 MG tablet Take 1  tablet by mouth once daily 90 tablet 3   meloxicam (MOBIC) 7.5 MG tablet TAKE 1 TO 2 TABLET(S) BY MOUTH ONCE DAILY AS NEEDED FOR PAIN 180 tablet 0   potassium chloride (K-DUR) 10 MEQ tablet Take 2 tablets (20 mEq total) by mouth daily. 180 tablet 3   rosuvastatin (CRESTOR) 10 MG tablet TAKE 1 TABLET BY MOUTH ONCE DAILY. ANNUAL APPOINTMENT IS DUE. MUST SEE PROVIDER FOR FURTHER REFILLS. 30 tablet 0   tamsulosin (FLOMAX) 0.4 MG CAPS capsule TAKE 1 CAPSULE BY MOUTH ONCE DAILY 90 capsule 1   traMADol-acetaminophen (ULTRACET) 37.5-325 MG tablet Take 1 tablet by mouth every 6 (six) hours as needed. 60 tablet 2   pantoprazole (PROTONIX) 40 MG tablet Take 1 tablet (40 mg total) by mouth daily. 90 tablet 3   No current facility-administered medications on file prior to visit.        ROS:  All others reviewed and negative.  Objective        PE:  BP (!) 148/70 (BP Location: Left Arm, Patient Position: Sitting, Cuff Size: Large)   Pulse (!) 58   Temp 98.9 F (37.2 C) (Oral)   Ht '5\' 8"'$  (1.727 m)   Wt 203 lb (92.1 kg)   SpO2 96%   BMI 30.87 kg/m                 Constitutional: Pt appears in NAD               HENT: Head: NCAT.                Right Ear: External ear normal.                 Left Ear: External ear normal.                Eyes: . Pupils are equal, round, and reactive to light. Conjunctivae and EOM are normal               Nose: without d/c or deformity               Neck: Neck supple. Gross normal ROM               Cardiovascular: Normal rate and regular rhythm.                 Pulmonary/Chest: Effort normal and breath sounds without rales or wheezing.                Abd:  Soft, NT, ND, + BS, no organomegaly               Neurological: Pt is  alert. At baseline orientation, motor grossly intact               Skin: Skin is warm. No rashes, no other new lesions, LE edema - none, but has bilat mcps with mild diffuse swelling tender               Psychiatric: Pt behavior is normal without  agitation   Micro: none  Cardiac tracings I have personally interpreted today:  none  Pertinent Radiological findings (summarize): none   Lab Results  Component Value Date   WBC 5.6 04/27/2021   HGB 13.1 04/27/2021   HCT 38.6 (L) 04/27/2021   PLT 180.0 04/27/2021   GLUCOSE 99 04/27/2021   CHOL 133 04/27/2021   TRIG 251.0 (H) 04/27/2021   HDL 33.50 (L) 04/27/2021   LDLDIRECT 59.0 04/27/2021   LDLCALC 71 12/07/2018   ALT 19 04/27/2021   AST 23 04/27/2021   NA 144 04/27/2021   K 3.5 04/27/2021   CL 103 04/27/2021   CREATININE 1.12 04/27/2021   BUN 17 04/27/2021   CO2 34 (H) 04/27/2021   TSH 2.36 04/27/2021   PSA 0.38 04/27/2021   INR 1.0 ratio 10/23/2009   HGBA1C 6.3 04/27/2021   MICROALBUR 1.1 04/27/2021   Assessment/Plan:  REGNALD SZCZEPANSKI is a 80 y.o. Black or African American [2] male with  has a past medical history of Allergic rhinitis, cause unspecified (01/31/2013), ANEMIA-NOS (01/28/2010), BPH (benign prostatic hypertrophy) (04/25/2013), CAD (02/17/2010), CAROTID BRUIT (11/18/2009), Carpal tunnel syndrome (05/20/2008), Cervical radiculitis (04/17/2012), CHEST PAIN (09/26/2009), DEPRESSION (06/12/2007), DYSPNEA (07/28/2010), ERECTILE DYSFUNCTION (02/24/2009), GERD (06/09/2007), GERD (gastroesophageal reflux disease), GLUCOSE INTOLERANCE (01/28/2010), Gout (2017), HEMORRHOIDS (06/09/2007), History of colonic polyps (08/07/2015), HYPERLIPIDEMIA (02/07/2008), HYPERTENSION (06/09/2007), HYPOKALEMIA (01/28/2010), Impaired glucose tolerance (01/23/2011), LOW BACK PAIN (06/12/2007), MYOCARDIAL PERFUSION SCAN, WITH STRESS TEST, ABNORMAL (10/08/2009), PARESTHESIA (04/25/2008), SHOULDER PAIN, LEFT (04/25/2008), URI (08/19/2008), and VITAMIN B12 DEFICIENCY (02/09/2010).  Bilateral leg pain Etiology unclear, for tx as above, but if not improved should f/u with sport medicine  Bilateral hand pain ? Gout vs other - for depomedrol im 80, predpac asd, check uric acid, consider add allopurinol, also for rheum labs as  ordered,  to f/u any worsening symptoms or concerns  Diabetes type 2, uncontrolled Lab Results  Component Value Date   HGBA1C 6.3 04/27/2021   Stable, pt to continue current medical treatment  - diet   Hypertension BP Readings from Last 3 Encounters:  06/19/21 (!) 148/70  05/25/21 132/80  04/27/21 (!) 150/70   Mild uncontrolled, likely situational, pt to continue medical treatment coreg, norvasc  Followup: Return if symptoms worsen or fail to improve.  Cathlean Cower, MD 06/20/2021 2:17 PM [facility]

## 2021-06-20 ENCOUNTER — Encounter: Payer: Self-pay | Admitting: Internal Medicine

## 2021-06-20 NOTE — Assessment & Plan Note (Signed)
Etiology unclear, for tx as above, but if not improved should f/u with sport medicine

## 2021-06-20 NOTE — Assessment & Plan Note (Addendum)
BP Readings from Last 3 Encounters:  06/19/21 (!) 148/70  05/25/21 132/80  04/27/21 (!) 150/70   Mild uncontrolled, likely situational, pt to continue medical treatment coreg, norvasc

## 2021-06-20 NOTE — Assessment & Plan Note (Signed)
Lab Results  Component Value Date   HGBA1C 6.3 04/27/2021   Stable, pt to continue current medical treatment  - diet

## 2021-06-20 NOTE — Assessment & Plan Note (Signed)
?   Gout vs other - for depomedrol im 80, predpac asd, check uric acid, consider add allopurinol, also for rheum labs as ordered,  to f/u any worsening symptoms or concerns

## 2021-06-22 ENCOUNTER — Encounter: Payer: Self-pay | Admitting: Internal Medicine

## 2021-06-22 LAB — URIC ACID: Uric Acid, Serum: 7.5 mg/dL (ref 4.0–8.0)

## 2021-06-22 LAB — C-REACTIVE PROTEIN: CRP: 1.5 mg/L (ref <8.0)

## 2021-06-22 LAB — ANA: Anti Nuclear Antibody (ANA): POSITIVE — AB

## 2021-06-22 LAB — SEDIMENTATION RATE: Sed Rate: 25 mm/h — ABNORMAL HIGH (ref 0–20)

## 2021-06-22 LAB — ANTI-NUCLEAR AB-TITER (ANA TITER): ANA Titer 1: 1:1280 {titer} — ABNORMAL HIGH

## 2021-06-22 LAB — RHEUMATOID FACTOR: Rheumatoid fact SerPl-aCnc: <14 IU/mL (ref <14)

## 2021-06-22 LAB — CYCLIC CITRUL PEPTIDE ANTIBODY, IGG: Cyclic Citrullin Peptide Ab: <16 UNITS

## 2021-07-03 ENCOUNTER — Telehealth: Payer: Self-pay | Admitting: Internal Medicine

## 2021-07-03 DIAGNOSIS — M79642 Pain in left hand: Secondary | ICD-10-CM

## 2021-07-03 DIAGNOSIS — M79641 Pain in right hand: Secondary | ICD-10-CM

## 2021-07-03 DIAGNOSIS — M79604 Pain in right leg: Secondary | ICD-10-CM

## 2021-07-03 NOTE — Telephone Encounter (Signed)
   Call patient with lab results. Spouse states never got copy of lab results in the mail

## 2021-07-06 NOTE — Telephone Encounter (Signed)
Ok I will resend the lab letter and referral is done

## 2021-07-06 NOTE — Telephone Encounter (Signed)
Results reviewed with patient. Patient inquiring about referral to Sports Medicine that was discussed at visit. Please advise

## 2021-07-06 NOTE — Telephone Encounter (Signed)
Patient notified

## 2021-07-09 ENCOUNTER — Ambulatory Visit: Payer: Medicare HMO | Admitting: Sports Medicine

## 2021-07-09 NOTE — Progress Notes (Deleted)
Travis Carey D.[place] Phone: [phone]   Assessment and Plan:     There are no diagnoses linked to this encounter.  ***   Pertinent previous records reviewed include ***   Follow Up: ***     Subjective:   I, [name], am serving as a scribe for Dr. Glennon Mac  Chief Complaint: Bilateral hand and leg pain   HPI:   07/09/21 Patient states that he has been having worsening bilateral hand pain and swelling for several months. Pain is worse in the morning and is stiff, the pain comes and goes, but once he starts moving his hands they start to loosen up and feel better. Patient also complains of bilateral leg pain that has been going on for    and locates Pain to   Relevant Historical Information: ***  Additional pertinent review of systems negative.   Current Outpatient Medications:    amLODipine (NORVASC) 5 MG tablet, Take 1 tablet (5 mg total) by mouth daily., Disp: 90 tablet, Rfl: 1   aspirin EC 81 MG tablet, Take 81 mg by mouth daily., Disp: , Rfl:    carvedilol (COREG) 25 MG tablet, Take 1 tablet (25 mg total) by mouth 2 (two) times daily with a meal. Must keep scheduled appt for March 3rd for future refills, Disp: 30 tablet, Rfl: 0   colchicine 0.6 MG tablet, Take 1 tablet (0.6 mg total) by mouth daily., Disp: 90 tablet, Rfl: 3   dexlansoprazole (DEXILANT) 60 MG capsule, Take 1 capsule (60 mg total) by mouth daily., Disp: 90 capsule, Rfl: 3   diclofenac sodium (VOLTAREN) 1 % GEL, Apply 2 g topically 4 (four) times daily as needed., Disp: 100 g, Rfl: 5   fexofenadine (ALLEGRA) 180 MG tablet, Take 1 tablet (180 mg total) by mouth daily. As needed (Patient taking differently: Take 180 mg by mouth daily as needed for allergies.), Disp: 90 tablet, Rfl: 3   fluticasone (FLONASE) 50 MCG/ACT nasal spray, USE TWO SPRAYS IN EACH NOSTRIL ONCE DAILY, Disp: 48 g, Rfl: 5   gabapentin (NEURONTIN)  100 MG capsule, TAKE 2 CAPSULES BY MOUTH THREE TIMES DAILY, Disp: 180 capsule, Rfl: 1   HYDROcodone-acetaminophen (NORCO/VICODIN) 5-325 MG tablet, Take 1 tablet by mouth every 6 (six) hours as needed for moderate pain., Disp: 30 tablet, Rfl: 0   lisinopril-hydrochlorothiazide (ZESTORETIC) 20-12.5 MG tablet, Take 1 tablet by mouth once daily, Disp: 90 tablet, Rfl: 3   meloxicam (MOBIC) 7.5 MG tablet, TAKE 1 TO 2 TABLET(S) BY MOUTH ONCE DAILY AS NEEDED FOR PAIN, Disp: 180 tablet, Rfl: 0   pantoprazole (PROTONIX) 40 MG tablet, Take 1 tablet (40 mg total) by mouth daily., Disp: 90 tablet, Rfl: 3   potassium chloride (K-DUR) 10 MEQ tablet, Take 2 tablets (20 mEq total) by mouth daily., Disp: 180 tablet, Rfl: 3   predniSONE (DELTASONE) 10 MG tablet, 3 tabs by mouth per day for 3 days,2tabs per day for 3 days,1tab per day for 3 days, Disp: 18 tablet, Rfl: 0   rosuvastatin (CRESTOR) 10 MG tablet, TAKE 1 TABLET BY MOUTH ONCE DAILY. ANNUAL APPOINTMENT IS DUE. MUST SEE PROVIDER FOR FURTHER REFILLS., Disp: 30 tablet, Rfl: 0   tamsulosin (FLOMAX) 0.4 MG CAPS capsule, TAKE 1 CAPSULE BY MOUTH ONCE DAILY, Disp: 90 capsule, Rfl: 1   traMADol-acetaminophen (ULTRACET) 37.5-325 MG tablet, Take 1 tablet by mouth every 6 (six) hours as needed., Disp: 60 tablet, Rfl: 2  Objective:     There were no vitals filed for this visit.    There is no height or weight on file to calculate BMI.    Physical Exam:    ***   Electronically signed by:  Travis Carey D.[facility] 1:13 PM 07/09/21

## 2021-07-14 ENCOUNTER — Other Ambulatory Visit: Payer: Self-pay

## 2021-07-14 ENCOUNTER — Ambulatory Visit: Payer: Medicare HMO | Admitting: Sports Medicine

## 2021-07-14 VITALS — BP 138/80 | HR 65 | Ht 68.0 in | Wt 207.0 lb

## 2021-07-14 DIAGNOSIS — M79642 Pain in left hand: Secondary | ICD-10-CM

## 2021-07-14 DIAGNOSIS — M25551 Pain in right hip: Secondary | ICD-10-CM | POA: Diagnosis not present

## 2021-07-14 DIAGNOSIS — R768 Other specified abnormal immunological findings in serum: Secondary | ICD-10-CM

## 2021-07-14 DIAGNOSIS — M79641 Pain in right hand: Secondary | ICD-10-CM | POA: Diagnosis not present

## 2021-07-14 MED ORDER — MELOXICAM 7.5 MG PO TABS: ORAL_TABLET | ORAL | 0 refills | Status: DC

## 2021-07-14 NOTE — Progress Notes (Signed)
Travis Carey D.[place] Phone: [phone]   Assessment and Plan:     1. Positive ANA (antinuclear antibody) 2. Bilateral hand pain -Chronic, initial sports medicine visit - Upon chart review, previous lab work shows the patient has positive ANA titer with positive centromere antibodies - Patient HPI, PE, lab work consistent with rheumatologic condition, so we will refer to rheumatology - Meloxicam 7.5 mg daily as needed for pain control.  Encouraged to stop taking Ultracet and Vicodin due to addictive properties, patient age and risk for falls  3. Right hip pain -Acute, initial sports medicine visit - Flare of OA versus rheumatologic condition.  Patient's descriptor of worse in the morning, worse with inactivity, better with activity, fits more consistently with rheumatologic condition - Referred to rheumatology - Meloxicam 7.5 mg daily as needed for pain control.  Encouraged to stop taking Ultracet and Vicodin due to addictive properties, patient age and risk for falls   Pertinent previous records reviewed include previous PCP note, lab work including hematologic work-up, old x-rays including left hand, right hand, right hip   Follow Up: As needed   Subjective:   I, Travis Carey, am serving as a scribe for Dr. Glennon Carey  Chief Complaint: bilateral hand and leg pain   HPI:   07/14/21 Patient is a 80 year old male presenting with bilateral hand and leg pain. Patient was seen by his PCP 06/19/21 for these symptoms and  C/O worsening bilateral hand pain and swelling over the last several months. Patient states the pain is worse in the morning and feels very stiff. Other wise the pain comes and goes and gets better once he uses his hands throughout the day. Patient complains of leg pain locates in hip and knees. Patient locates pain in legs more specifically to lateral hip/lower back. And the pain has  been bothering him for 2 months. Patient states that the hips hurt worse when sitting too long or going from seated to standing position. Patient had labs done at PCP's office on 06/19/21 and patient has had hip and R knee x rays on 05/25/2021.  Leg pain  Radiates:  Mechanical symptoms:no Numbness/tingling: yes Weakness:yes  Treatments tried: resting , heat    Relevant Historical Information: Positive ANA and anticentromere lab work  Additional pertinent review of systems negative.   Current Outpatient Medications:    amLODipine (NORVASC) 5 MG tablet, Take 1 tablet (5 mg total) by mouth daily., Disp: 90 tablet, Rfl: 1   aspirin EC 81 MG tablet, Take 81 mg by mouth daily., Disp: , Rfl:    carvedilol (COREG) 25 MG tablet, Take 1 tablet (25 mg total) by mouth 2 (two) times daily with a meal. Must keep scheduled appt for March 3rd for future refills, Disp: 30 tablet, Rfl: 0   colchicine 0.6 MG tablet, Take 1 tablet (0.6 mg total) by mouth daily., Disp: 90 tablet, Rfl: 3   dexlansoprazole (DEXILANT) 60 MG capsule, Take 1 capsule (60 mg total) by mouth daily., Disp: 90 capsule, Rfl: 3   diclofenac sodium (VOLTAREN) 1 % GEL, Apply 2 g topically 4 (four) times daily as needed., Disp: 100 g, Rfl: 5   fexofenadine (ALLEGRA) 180 MG tablet, Take 1 tablet (180 mg total) by mouth daily. As needed (Patient taking differently: Take 180 mg by mouth daily as needed for allergies.), Disp: 90 tablet, Rfl: 3   fluticasone (FLONASE) 50 MCG/ACT nasal spray, USE TWO  SPRAYS IN EACH NOSTRIL ONCE DAILY, Disp: 48 g, Rfl: 5   gabapentin (NEURONTIN) 100 MG capsule, TAKE 2 CAPSULES BY MOUTH THREE TIMES DAILY, Disp: 180 capsule, Rfl: 1   HYDROcodone-acetaminophen (NORCO/VICODIN) 5-325 MG tablet, Take 1 tablet by mouth every 6 (six) hours as needed for moderate pain., Disp: 30 tablet, Rfl: 0   lisinopril-hydrochlorothiazide (ZESTORETIC) 20-12.5 MG tablet, Take 1 tablet by mouth once daily, Disp: 90 tablet, Rfl: 3   potassium  chloride (K-DUR) 10 MEQ tablet, Take 2 tablets (20 mEq total) by mouth daily., Disp: 180 tablet, Rfl: 3   predniSONE (DELTASONE) 10 MG tablet, 3 tabs by mouth per day for 3 days,2tabs per day for 3 days,1tab per day for 3 days, Disp: 18 tablet, Rfl: 0   rosuvastatin (CRESTOR) 10 MG tablet, TAKE 1 TABLET BY MOUTH ONCE DAILY. ANNUAL APPOINTMENT IS DUE. MUST SEE PROVIDER FOR FURTHER REFILLS., Disp: 30 tablet, Rfl: 0   tamsulosin (FLOMAX) 0.4 MG CAPS capsule, TAKE 1 CAPSULE BY MOUTH ONCE DAILY, Disp: 90 capsule, Rfl: 1   traMADol-acetaminophen (ULTRACET) 37.5-325 MG tablet, Take 1 tablet by mouth every 6 (six) hours as needed., Disp: 60 tablet, Rfl: 2   meloxicam (MOBIC) 7.5 MG tablet, TAKE 1 TABLET BY MOUTH ONCE DAILY AS NEEDED FOR PAIN, Disp: 30 tablet, Rfl: 0   pantoprazole (PROTONIX) 40 MG tablet, Take 1 tablet (40 mg total) by mouth daily., Disp: 90 tablet, Rfl: 3   Objective:     Vitals:   07/14/21 1447  BP: 138/80  Pulse: 65  SpO2: 95%  Weight: 207 lb (93.9 kg)  Height: 5\' 8"  (1.727 m)      Body mass index is 31.47 kg/m.    Physical Exam:    General: Appears well, nad, nontoxic and pleasant Neuro:sensation intact, strength is 5/5 with df/pf/inv/ev, muscle tone wnl Skin:no susupicious lesions or rashes  Bilateral wrist: Hypothenar muscular deformity and right hand. Wrist ROM  Ext 90, flexion70, radial/ulnar deviation 30 nttp over the snauff box, dorsal carpals, volar carpals, radial styloid, ulnar styloid, 1st mcp, tfcc Negative Tinel's Negative finklestein Neg tfcc bounce test No pain with resisted ext, flex or deviation    Right hip: No deformity, swelling or wasting ROM Fexion 80, ext 15, IR 30, ER 30 NTTP over the hip flexors, adductors, inguinal canal, pubic ramus, greater troch, glute musculature, si joint, lumbar spine Negative log roll with FROM Negative FABER Positive FADIR Gait normal   Electronically signed by:  Travis Carey D.[facility] 3:12 PM 07/14/21

## 2021-07-14 NOTE — Patient Instructions (Addendum)
Good to see you  Referral to rheumatology  Stop taking Vicodin Stop taking Ultracet Refill of mobic/meloxicam sent in

## 2021-07-30 ENCOUNTER — Other Ambulatory Visit: Payer: Self-pay

## 2021-07-30 ENCOUNTER — Ambulatory Visit: Payer: Medicare HMO | Admitting: Gastroenterology

## 2021-07-30 ENCOUNTER — Encounter: Payer: Self-pay | Admitting: Gastroenterology

## 2021-07-30 VITALS — BP 172/68 | HR 68 | Temp 97.3°F | Ht 68.0 in | Wt 209.0 lb

## 2021-07-30 DIAGNOSIS — Z1211 Encounter for screening for malignant neoplasm of colon: Secondary | ICD-10-CM

## 2021-07-30 NOTE — Progress Notes (Signed)
Jonathon Bellows MD, MRCP(U.K) [address]  [place]  [place], [place] [address]  Main: [phone]  Fax: [phone]   Gastroenterology Consultation  Referring Provider:     Biagio Borg, MD Primary Care Physician:  Biagio Borg, MD Primary Gastroenterologist:  Dr. Jonathon Bellows  Reason for Consultation:     Colon cancer screening        HPI:   Travis Carey is a 80 y.o. y/o male referred for consultation & management  by Dr. Jenny Reichmann, Hunt Oris, MD.     He is here today to discuss for colon cancer screening.  He states that he has had a Cologuard test which I found was done in November 2021 and it was negative.  He does not have any active GI symptoms at this point of time.  No change in bowel habits at this point of time.  Past Medical History:  Diagnosis Date   Allergic rhinitis, cause unspecified 01/31/2013   ANEMIA-NOS 01/28/2010   BPH (benign prostatic hypertrophy) 04/25/2013   CAD 02/17/2010   CAROTID BRUIT 11/18/2009   Carpal tunnel syndrome 05/20/2008   Cervical radiculitis 04/17/2012   CHEST PAIN 09/26/2009   DEPRESSION 06/12/2007   DYSPNEA 07/28/2010   ERECTILE DYSFUNCTION 02/24/2009   GERD 06/09/2007   GERD (gastroesophageal reflux disease)    GLUCOSE INTOLERANCE 01/28/2010   Gout 2017   Dr.John   HEMORRHOIDS 06/09/2007   History of colonic polyps 08/07/2015   HYPERLIPIDEMIA 02/07/2008   HYPERTENSION 06/09/2007   HYPOKALEMIA 01/28/2010   Impaired glucose tolerance 01/23/2011   LOW BACK PAIN 06/12/2007   MYOCARDIAL PERFUSION SCAN, WITH STRESS TEST, ABNORMAL 10/08/2009   PARESTHESIA 04/25/2008   SHOULDER PAIN, LEFT 04/25/2008   URI 08/19/2008   VITAMIN B12 DEFICIENCY 02/09/2010    Past Surgical History:  Procedure Laterality Date   COLONOSCOPY     CORONARY STENT PLACEMENT     from right radial    Prior to Admission medications   Medication Sig Start Date End Date Taking? Authorizing Provider  amLODipine (NORVASC) 5 MG tablet Take 1 tablet (5 mg total) by mouth daily.  04/27/21   Biagio Borg, MD  aspirin EC 81 MG tablet Take 81 mg by mouth daily.    [provider]  carvedilol (COREG) 25 MG tablet Take 1 tablet (25 mg total) by mouth 2 (two) times daily with a meal. Must keep scheduled appt for March 3rd for future refills 11/28/19   Biagio Borg, MD  clopidogrel (PLAVIX) 75 MG tablet  05/10/21   [provider]  colchicine 0.6 MG tablet Take 1 tablet (0.6 mg total) by mouth daily. 08/17/18   Biagio Borg, MD  dexlansoprazole (DEXILANT) 60 MG capsule Take 1 capsule (60 mg total) by mouth daily. 01/31/13   Biagio Borg, MD  diclofenac sodium (VOLTAREN) 1 % GEL Apply 2 g topically 4 (four) times daily as needed. 06/07/19   Biagio Borg, MD  fexofenadine (ALLEGRA) 180 MG tablet Take 1 tablet (180 mg total) by mouth daily. As needed Patient taking differently: Take 180 mg by mouth daily as needed for allergies. 06/12/14   Biagio Borg, MD  fluticasone [facility]) 50 MCG/ACT nasal spray USE TWO SPRAYS IN [facility] NOSTRIL ONCE DAILY 08/06/16   Biagio Borg, MD  gabapentin (NEURONTIN) 100 MG capsule TAKE 2 CAPSULES BY MOUTH THREE TIMES DAILY 08/22/19   Biagio Borg, MD  HYDROcodone-acetaminophen (NORCO/VICODIN) 5-325 MG tablet Take 1 tablet by  mouth every 6 (six) hours as needed for moderate pain. 06/19/21   Biagio Borg, MD  lisinopril-hydrochlorothiazide (ZESTORETIC) 20-12.5 MG tablet Take 1 tablet by mouth once daily 02/05/19   Biagio Borg, MD  meloxicam (MOBIC) 7.5 MG tablet TAKE 1 TABLET BY MOUTH ONCE DAILY AS NEEDED FOR PAIN 07/14/21   Glennon Mac, DO  pantoprazole (PROTONIX) 40 MG tablet Take 1 tablet (40 mg total) by mouth daily. 06/07/19 06/06/20  Biagio Borg, MD  potassium chloride (K-DUR) 10 MEQ tablet Take 2 tablets (20 mEq total) by mouth daily. 06/07/19   Biagio Borg, MD  predniSONE (DELTASONE) 10 MG tablet 3 tabs by mouth per day for 3 days,2tabs per day for 3 days,1tab per day for 3 days 06/19/21   Biagio Borg, MD  rosuvastatin  (CRESTOR) 10 MG tablet TAKE 1 TABLET BY MOUTH ONCE DAILY. ANNUAL APPOINTMENT IS DUE. MUST SEE PROVIDER FOR FURTHER REFILLS. 03/04/20   Biagio Borg, MD  tamsulosin (FLOMAX) 0.4 MG CAPS capsule TAKE 1 CAPSULE BY MOUTH ONCE DAILY 05/23/18   Biagio Borg, MD  traMADol-acetaminophen (ULTRACET) 37.5-325 MG tablet Take 1 tablet by mouth every 6 (six) hours as needed. 04/27/21   Biagio Borg, MD    Family History  Problem Relation Age of Onset   Alcohol abuse Sister        ETOH dependence   Alcohol abuse Brother        ETOH  dependence   Heart attack Other    Stroke Other    Hypertension Other    Coronary artery disease Other    Hyperlipidemia Mother    Heart disease Mother    Hypertension Mother    Hyperlipidemia Father    Heart disease Father    Hypertension Father      Social History   Tobacco Use   Smoking status: Never   Smokeless tobacco: Never  Vaping Use   Vaping Use: Never used  Substance Use Topics   Alcohol use: No   Drug use: No    Allergies as of 07/30/2021 - Review Complete 07/14/2021  Allergen Reaction Noted   Diclofenac sodium      Review of Systems:    All systems reviewed and negative except where noted in HPI.   Physical Exam:  There were no vitals taken for this visit. No LMP for male patient. Psych:  Alert and cooperative. Normal mood and affect. General:   Alert,  Well-developed, well-nourished, pleasant and cooperative in NAD Head:  Normocephalic and atraumatic. Eyes:  Sclera clear, no icterus.   Conjunctiva pink. Ears:  Normal auditory acuity.  Neurologic:  Alert and oriented x3;  grossly normal neurologically. Psych:  Alert and cooperative. Normal mood and affect.  Imaging Studies: No results found.  Assessment and Plan:   Travis Carey is a 80 y.o. y/o male has been referred for colon cancer screening.  Explained to him that usually for colon cancer screening we do not screen for those over the age of 70.  Above this age is usually on a  risk-benefit ratio basis.  As one gets older the risks of anesthesia start to gradually rise and the relative benefits of a colonoscopy decline. He will go home and decide if he wishes to pursue.  In addition I explained to him that since his Cologuard was negative in November 2021 it would suffice as a test for 3 years if one did not have colon polyps but in his case it  should not have been done to begin with as he has had colon polyps in the past.  Cologuard is only meant for colon cancer screening average risk.  Follow up in PRN  Dr Jonathon Bellows MD,MRCP(U.K)

## 2021-08-03 ENCOUNTER — Telehealth: Payer: Self-pay | Admitting: Gastroenterology

## 2021-08-03 NOTE — Telephone Encounter (Signed)
Pt. Calling he says he has chosen not to have a colonoscopy at his age.

## 2021-08-04 ENCOUNTER — Ambulatory Visit: Payer: Medicare HMO | Admitting: Adult Health

## 2021-08-19 NOTE — Progress Notes (Signed)
Office Visit Note  Patient: Travis Carey             Date of Birth: [birth year]           MRN: [id number]             PCP: Biagio Borg, MD Referring: Glennon Mac, DO Visit Date: 08/20/2021 Occupation: Retired Dealer  Subjective:  New Patient (Initial Visit) (Bil leg and hip pain, bil feet numbness)   History of Present Illness: Travis Carey is a 80 y.o. male here for joint pain in multiple sites and positive ANA. Particularly he has had increased pain in his hands and lower extremities. 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  He has numbness in bilateral distal legs he has to pause walking or standing for this and is relieved after leaning on something or sitting for about 5 minutes. He has injections in the low back earlier this year without a great improvement. Bilateral hand pain and swelling he had carpal tunnel syndrome in the past but not recently having similar issues to that. He has had some atrophy of thenar muscles and decreased grip strength. Sometimes hands were very puffy or swollen and decreased flexibility currently they are somewhat better. 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 Lab tests were negative for rheumatoid arthritis related antibodies but highly positive ANA 1:1280. He was seen in sports medicine clinic with concern for inflammatory joint pain and treated with oral meloxicam for this. He took some low dose pain medication and felt very drowsy or loopy so did not take much. He has GERD but without frequent symptoms as long as he takes medicine for this. He experiences pain and numbness in fingertips with cold exposure worst on right index finger no specific discoloration or injuries reported. He has mild pedal edema.  Labs reviewed 06/2021 ANA 1:1280 centromere RF neg CCP neg ESR 25 CRP 1.5 Uric acid 7.5  Imaging reviewed 05/25/21  Right hip xray IMPRESSION: Mild degenerative change   11/30/20 Lumbar MRI IMPRESSION: 1. Severe multifactorial spinal stenosis at L2-3 and L3-4 likely to cause neural compression. Findings are worse at L3-4. Slight worsening since 2019. 2. Spinal stenosis at L4-5 because of a small canal, abundant epidural fat and bulging of the disc. Facet and ligamentous hypertrophy. Neural compression could occur at this level as well. The facet arthropathy is associated with edema and could contribute to low back pain. 3. L1-2 disc bulge. Facet osteoarthritis. Multifactorial stenosis with crowding of the nerve roots but without definite focal neural compression. 4. Aortic atherosclerosis. Infrarenal abdominal aortic aneurysm with maximal diameter 3.2 cm, not visibly changed since 2019. Recommend follow-up ultrasound every 3 years  Activities of Daily Living:  Patient reports morning stiffness for 2 hours.   Patient Reports nocturnal pain.  Difficulty dressing/grooming: Denies Difficulty climbing stairs: Reports Difficulty getting out of chair: Denies Difficulty using hands for taps, buttons, cutlery, and/or writing: Reports  Review of Systems  Constitutional:  Positive for fatigue.  HENT:  Positive for mouth dryness.   Eyes:  Positive for dryness.  Respiratory:  Positive for shortness of breath.   Cardiovascular:  Positive for swelling in legs/feet.  Gastrointestinal:  Positive for constipation.  Endocrine: Positive for heat intolerance and increased urination.  Genitourinary:  Positive for decreased urine output.  Musculoskeletal:  Positive for joint pain, gait problem, joint  pain, joint swelling, muscle weakness and morning stiffness.  Skin:  Negative for rash.  Allergic/Immunologic: Negative for susceptible to infections.  Neurological:  Positive for numbness and weakness.  Hematological:  Negative for bruising/bleeding tendency.  Psychiatric/Behavioral:  Positive for sleep disturbance.     PMFS History:  Patient Active Problem List   Diagnosis Date Noted   Positive ANA (antinuclear antibody) 08/20/2021   Bilateral leg pain 06/19/2021   Right hip pain 05/25/2021   Right knee pain 04/27/2021   Bilateral hand numbness 10/02/2020   Bilateral carotid artery stenosis 08/06/2019   Lumbar spinal stenosis 12/07/2018   Bilateral hand pain 08/17/2018   Left groin pain 08/17/2018   Right lumbar radiculopathy 02/02/2018   Right Achilles tendinitis 06/08/2017   Swelling of extremity 06/08/2017   Achilles tendon disorder, right 05/03/2017   Acute gouty arthritis 09/16/2016   Epididymo-orchitis 08/05/2016   Vertigo 05/05/2016   History of colonic polyps 08/07/2015   CVA (cerebral infarction) 04/07/2015   Urinary frequency 03/29/2015   Diabetes type 2, uncontrolled 03/29/2015   Right arm pain 11/27/2014   Epididymal cyst 06/12/2014   Left wrist pain 11/01/2013   Right wrist pain 11/01/2013   Nocturia 11/01/2013   Encounter for well adult exam with abnormal findings 11/01/2013   Obese 09/12/2013   Benign prostatic hyperplasia 04/25/2013   Knee pain, bilateral 04/25/2013   Chest pain 01/31/2013   Eustachian tube dysfunction 01/31/2013   Cough 01/31/2013   Allergic rhinitis 01/31/2013   Cervical radiculitis 04/17/2012   Cervical disc disease 04/17/2012   Paresthesia of arm 12/08/2011   Hypertension 12/08/2011   Myalgia 11/05/2011   B12 deficiency 11/05/2011   Fatigue 07/27/2011   COPD (chronic obstructive pulmonary disease) ([place]) 01/26/2011   Left knee pain 01/26/2011   Coronary atherosclerosis 02/17/2010   Hypokalemia 01/28/2010   ANEMIA-NOS 01/28/2010   CAROTID BRUIT 11/18/2009   Carotid bruit 11/18/2009   ERECTILE DYSFUNCTION 02/24/2009   Carpal tunnel syndrome 05/20/2008   PARESTHESIA 04/25/2008   Hyperlipidemia 02/07/2008   DEPRESSION 06/12/2007   LOW BACK PAIN 06/12/2007   Gastroesophageal reflux disease without esophagitis 06/09/2007    Past Medical  History:  Diagnosis Date   Allergic rhinitis, cause unspecified 01/31/2013   ANEMIA-NOS 01/28/2010   BPH (benign prostatic hypertrophy) 04/25/2013   CAD 02/17/2010   CAROTID BRUIT 11/18/2009   Carpal tunnel syndrome 05/20/2008   Cervical radiculitis 04/17/2012   CHEST PAIN 09/26/2009   DEPRESSION 06/12/2007   DYSPNEA 07/28/2010   ERECTILE DYSFUNCTION 02/24/2009   GERD 06/09/2007   GERD (gastroesophageal reflux disease)    GLUCOSE INTOLERANCE 01/28/2010   Gout 2017   Dr.John   HEMORRHOIDS 06/09/2007   History of colonic polyps 08/07/2015   HYPERLIPIDEMIA 02/07/2008   HYPERTENSION 06/09/2007   HYPOKALEMIA 01/28/2010   Impaired glucose tolerance 01/23/2011   LOW BACK PAIN 06/12/2007   MYOCARDIAL PERFUSION SCAN, WITH STRESS TEST, ABNORMAL 10/08/2009   PARESTHESIA 04/25/2008   SHOULDER PAIN, LEFT 04/25/2008   URI 08/19/2008   VITAMIN B12 DEFICIENCY 02/09/2010    Family History  Problem Relation Age of Onset   Hyperlipidemia Mother    Heart disease Mother    Hypertension Mother    Hyperlipidemia Father    Heart disease Father    Hypertension Father    Alcohol abuse Sister        ETOH dependence   Alcohol abuse Brother        ETOH  dependence   Heart disease Brother    Heart attack Other  Stroke Other    Hypertension Other    Coronary artery disease Other    Past Surgical History:  Procedure Laterality Date   COLONOSCOPY     CORONARY STENT PLACEMENT     from right radial   Social History   Social History Narrative   Lives in [place]. 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      Wife died 2007/12/26   Immunization History  Administered Date(s) Administered   Influenza Split 07/27/2011   Influenza Whole 08/09/1997, 08/28/2009, 07/28/2010   Influenza, High Dose Seasonal PF 08/11/2017, 08/17/2018, 07/23/2019, 07/23/2019   Influenza,inj,Quad PF,6+ Mos 06/12/2014, 08/07/2015   Influenza-Unspecified 06/11/2013, 06/12/2014, 08/07/2015   PFIZER Comirnaty(Gray Top)Covid-19 Tri-Sucrose Vaccine  02/05/2020   PFIZER(Purple Top)SARS-COV-2 Vaccination 01/30/2020, 10/01/2020   Pneumococcal Conjugate-13 11/01/2013   Pneumococcal Polysaccharide-23 08/19/2008   Td 08/19/2008   Tdap 12/07/2018   Zoster, Live 01/31/2013     Objective: Vital Signs: BP (!) 154/64 (BP Location: Right Arm, Patient Position: Sitting, Cuff Size: Normal)   Pulse 76   Resp 17   Ht 5' 7.5" (1.715 m)   Wt 211 lb (95.7 kg)   BMI 32.56 kg/m    Physical Exam Constitutional:      Appearance: He is obese.  HENT:     Mouth/Throat:     Mouth: Mucous membranes are moist.     Pharynx: Oropharynx is clear.  Eyes:     Conjunctiva/sclera: Conjunctivae normal.  Cardiovascular:     Rate and Rhythm: Normal rate and regular rhythm.  Pulmonary:     Effort: Pulmonary effort is normal.     Breath sounds: Normal breath sounds.  Musculoskeletal:     Comments: 1+ pedal edema b/l, no dermatitis  Skin:    General: Skin is warm and dry.     Findings: No rash.     Comments: Nailfold capillary dropout on bilateral hands  Neurological:     Mental Status: He is alert.  Psychiatric:        Mood and Affect: Mood normal.     Musculoskeletal Exam:  Neck full ROM no tenderness Shoulders slightly decreased abduction ROM Elbows full ROM no tenderness or swelling Right wrist decreased flexion and particularly extension, left normal, no palpable swelling Right index finger incomplete flexion ROM others are intact although with stiffness, no tenderness or palpable swelling, mild overlying skin thickening on fingers without discoloration Knees no tenderness or effusions   Investigation: No additional findings.  Imaging: No results found.  Recent Labs: Lab Results  Component Value Date   WBC 5.6 04/27/2021   HGB 13.1 04/27/2021   PLT 180.0 04/27/2021   NA 144 04/27/2021   K 3.5 04/27/2021   CL 103 04/27/2021   CO2 34 (H) 04/27/2021   GLUCOSE 99 04/27/2021   BUN 17 04/27/2021   CREATININE 1.12 04/27/2021    BILITOT 0.5 04/27/2021   ALKPHOS 56 04/27/2021   AST 23 04/27/2021   ALT 19 04/27/2021   PROT 6.8 04/27/2021   ALBUMIN 4.1 04/27/2021   CALCIUM 9.9 04/27/2021   GFRAA >60 03/17/2016    Speciality Comments: No specialty comments available.  Procedures:  No procedures performed Allergies: Diclofenac sodium   Assessment / Plan:     Visit Diagnoses: Bilateral hand pain  Bilateral hand numbness - Plan: Anti-scleroderma antibody, RNP Antibody, Anti-Smith antibody, Sjogrens syndrome-A extractable nuclear antibody, Anti-DNA antibody, double-stranded, Centromere Antibodies, Sedimentation rate  Bilateral hand pain swelling numbness and skin changes may be suggestive for chronic inflammatory disease when associated with  the high positive ANA anticentromere antibodies.  We will check specific antibody markers as detailed above also sedimentation rate.  He does not describe typical Raynaud's symptoms although does report pain and numbness occurring with cold exposure and has abnormal nailfold capillaries on exam.  Although chronic hand skin thickening and hyperkeratosis may also be related to occupation.  Bilateral leg pain Spinal stenosis of lumbar region with neurogenic claudication  The leg pain and numbness appears very consistent with neurogenic claudication review of lumbar spine MRI from earlier this year shows a pretty considerable disease. I do not see any inflammatory changes in knees or feet to indicate gouty arthritis or other autoimmune proces at these areas.  Gastroesophageal reflux disease without esophagitis  Describe chronic reflux and indigestion symptoms but no history of Barrett's esophagus or other complications.  No pulmonary complaints or abnormal exam.  Orders: Orders Placed This Encounter  Procedures   Anti-scleroderma antibody   RNP Antibody   Anti-Smith antibody   Sjogrens syndrome-A extractable nuclear antibody   Anti-DNA antibody, double-stranded   Centromere  Antibodies   Sedimentation rate    No orders of the defined types were placed in this encounter.   Follow-Up Instructions: No follow-ups on file.   Collier Salina, MD  Note - This record has been created using [employer].  Chart creation errors have been sought, but may not always  have been located. Such creation errors do not reflect on  the standard of medical care.

## 2021-08-20 ENCOUNTER — Other Ambulatory Visit: Payer: Self-pay

## 2021-08-20 ENCOUNTER — Ambulatory Visit: Payer: Medicare HMO | Admitting: Internal Medicine

## 2021-08-20 ENCOUNTER — Encounter: Payer: Self-pay | Admitting: Internal Medicine

## 2021-08-20 VITALS — BP 154/64 | HR 76 | Resp 17 | Ht 67.5 in | Wt 211.0 lb

## 2021-08-20 DIAGNOSIS — R200 Anesthesia of skin: Secondary | ICD-10-CM | POA: Diagnosis not present

## 2021-08-20 DIAGNOSIS — M79605 Pain in left leg: Secondary | ICD-10-CM | POA: Diagnosis not present

## 2021-08-20 DIAGNOSIS — M79604 Pain in right leg: Secondary | ICD-10-CM | POA: Diagnosis not present

## 2021-08-20 DIAGNOSIS — M79641 Pain in right hand: Secondary | ICD-10-CM | POA: Diagnosis not present

## 2021-08-20 DIAGNOSIS — R768 Other specified abnormal immunological findings in serum: Secondary | ICD-10-CM

## 2021-08-20 DIAGNOSIS — M48062 Spinal stenosis, lumbar region with neurogenic claudication: Secondary | ICD-10-CM | POA: Diagnosis not present

## 2021-08-20 DIAGNOSIS — M79642 Pain in left hand: Secondary | ICD-10-CM

## 2021-08-20 DIAGNOSIS — K219 Gastro-esophageal reflux disease without esophagitis: Secondary | ICD-10-CM | POA: Diagnosis not present

## 2021-08-21 LAB — ANTI-SCLERODERMA ANTIBODY: Scleroderma (Scl-70) (ENA) Antibody, IgG: <1 AI

## 2021-08-21 LAB — SJOGRENS SYNDROME-A EXTRACTABLE NUCLEAR ANTIBODY: SSA (Ro) (ENA) Antibody, IgG: 3.7 AI — AB

## 2021-08-21 LAB — ANTI-SMITH ANTIBODY: ENA SM Ab Ser-aCnc: <1 AI

## 2021-08-21 LAB — CENTROMERE ANTIBODIES: Centromere Ab Screen: >8 AI — AB

## 2021-08-21 LAB — ANTI-DNA ANTIBODY, DOUBLE-STRANDED: ds DNA Ab: 2 IU/mL

## 2021-08-21 LAB — RNP ANTIBODY: Ribonucleic Protein(ENA) Antibody, IgG: <1 AI

## 2021-08-21 LAB — SEDIMENTATION RATE: Sed Rate: 22 mm/h — ABNORMAL HIGH (ref 0–20)

## 2021-08-31 DIAGNOSIS — I6523 Occlusion and stenosis of bilateral carotid arteries: Secondary | ICD-10-CM | POA: Diagnosis not present

## 2021-08-31 DIAGNOSIS — E782 Mixed hyperlipidemia: Secondary | ICD-10-CM | POA: Diagnosis not present

## 2021-08-31 DIAGNOSIS — I10 Essential (primary) hypertension: Secondary | ICD-10-CM | POA: Diagnosis not present

## 2021-08-31 DIAGNOSIS — I2510 Atherosclerotic heart disease of native coronary artery without angina pectoris: Secondary | ICD-10-CM | POA: Diagnosis not present

## 2021-09-02 ENCOUNTER — Telehealth: Payer: Self-pay | Admitting: Internal Medicine

## 2021-09-02 MED ORDER — COLCHICINE 0.6 MG PO TABS: 0.6000 mg | ORAL_TABLET | Freq: Every day | ORAL | 0 refills | Status: DC

## 2021-09-02 NOTE — Telephone Encounter (Signed)
Patient's wife notified that medication was sent

## 2021-09-02 NOTE — Telephone Encounter (Signed)
Patient's spouse Travis Carey requesting medication refill for gout  Patient nor spouse knew the name of the medication  Patient was last seen 06-19-2021  [place] (N), [place]

## 2021-09-07 NOTE — Progress Notes (Signed)
Office Visit Note  Patient: Travis Carey             Date of Birth: Sep 02, 1941           MRN: [id number]             PCP: Biagio Borg, MD Referring: Biagio Borg, MD Visit Date: 09/08/2021   Subjective:  No chief complaint on file.   History of Present Illness: Travis Carey is a 80 y.o. male here for follow up with joint pains initial visit exam suggestive for mostly degenerative changes and neurogenic claudication features labs with normal inflammatory markers but highly positive anti centromere Abs 8.0 and positive SSA 3.7.  Since our last visit symptoms remain overall unchanged.  His biggest complaint today continues to be the bilateral leg pains he is unable to walk for more than about 5 minutes at a time before developing radiating symptoms.  He is also having pain in the lateral hips and thighs that is distracting also bothersome sleeping at nighttime.  Hand stiffness remains about the same still not having any discoloration and this is not his primary complaint at the moment.  He reports some intermittent episodes of numbness on the backs of the hands.  Review of prior imaging and testing includes overall normal echocardiogram from 2016 nuclear medicine study of the heart looked good after previous stenting for 90% arterial obstruction.  2018 EKG with nonspecific first-degree block.  Previous HPI 08/20/21 Travis Carey is a 80 y.o. male here for joint pain in multiple sites and positive ANA. Particularly he has had increased pain in his hands and lower extremities. 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  He has numbness in bilateral distal legs he has to pause walking or standing for this and is relieved after leaning on something or sitting for about 5 minutes. He has injections in the low back earlier this year without a great improvement. Bilateral hand pain and swelling he had carpal tunnel syndrome  in the past but not recently having similar issues to that. He has had some atrophy of thenar muscles and decreased grip strength. Sometimes hands were very puffy or swollen and decreased flexibility currently they are somewhat better. 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 Lab tests were negative for rheumatoid arthritis related antibodies but highly positive ANA 1:1280. He was seen in sports medicine clinic with concern for inflammatory joint pain and treated with oral meloxicam for this. He took some low dose pain medication and felt very drowsy or loopy so did not take much. He has GERD but without frequent symptoms as long as he takes medicine for this. He experiences pain and numbness in fingertips with cold exposure worst on right index finger no specific discoloration or injuries reported. He has mild pedal edema.   Labs reviewed 06/2021 ANA 1:1280 centromere RF neg CCP neg ESR 25 CRP 1.5 Uric acid 7.5   Imaging reviewed 05/25/21 Right hip xray IMPRESSION: Mild degenerative change   11/30/20 Lumbar MRI IMPRESSION: 1. Severe multifactorial spinal stenosis at L2-3 and L3-4 likely to cause neural compression. Findings are worse at L3-4. Slight worsening since 2019. 2. Spinal stenosis at L4-5 because of a small canal, abundant epidural fat and bulging of the disc. Facet and ligamentous hypertrophy. Neural compression could occur at this level as well. The facet arthropathy  is associated with edema and could contribute to low back pain. 3. L1-2 disc bulge. Facet osteoarthritis. Multifactorial stenosis with crowding of the nerve roots but without definite focal neural compression. 4. Aortic atherosclerosis. Infrarenal abdominal aortic aneurysm with maximal diameter 3.2 cm, not visibly changed since 2019. Recommend follow-up ultrasound every 3 years   Review of Systems  Constitutional:  Positive for fatigue.  HENT:  Positive for mouth dryness and  nose dryness. Negative for mouth sores.   Eyes:  Positive for pain, itching and dryness.  Respiratory:  Negative for shortness of breath and difficulty breathing.   Cardiovascular:  Negative for chest pain and palpitations.  Gastrointestinal:  Positive for constipation. Negative for blood in stool and diarrhea.  Endocrine: Negative for increased urination.  Genitourinary:  Positive for urgency. Negative for difficulty urinating.  Musculoskeletal:  Positive for joint pain, joint pain, joint swelling, myalgias, morning stiffness, muscle tenderness and myalgias.  Skin:  Negative for color change, rash and redness.  Allergic/Immunologic: Positive for susceptible to infections.  Neurological:  Positive for weakness. Negative for dizziness, numbness, headaches and memory loss.  Hematological:  Negative for bruising/bleeding tendency.  Psychiatric/Behavioral:  Negative for confusion.    PMFS History:  Patient Active Problem List   Diagnosis Date Noted   Positive ANA (antinuclear antibody) 08/20/2021   Bilateral leg pain 06/19/2021   Right hip pain 05/25/2021   Right knee pain 04/27/2021   Bilateral hand numbness 10/02/2020   Bilateral carotid artery stenosis 08/06/2019   Lumbar spinal stenosis 12/07/2018   Bilateral hand pain 08/17/2018   Left groin pain 08/17/2018   Right lumbar radiculopathy 02/02/2018   Right Achilles tendinitis 06/08/2017   Swelling of extremity 06/08/2017   Achilles tendon disorder, right 05/03/2017   Acute gouty arthritis 09/16/2016   Epididymo-orchitis 08/05/2016   Vertigo 05/05/2016   History of colonic polyps 08/07/2015   CVA (cerebral infarction) 04/07/2015   Urinary frequency 03/29/2015   Diabetes type 2, uncontrolled 03/29/2015   Right arm pain 11/27/2014   Epididymal cyst 06/12/2014   Left wrist pain 11/01/2013   Right wrist pain 11/01/2013   Nocturia 11/01/2013   Encounter for well adult exam with abnormal findings 11/01/2013   Obese 09/12/2013    Benign prostatic hyperplasia 04/25/2013   Knee pain, bilateral 04/25/2013   Chest pain 01/31/2013   Eustachian tube dysfunction 01/31/2013   Cough 01/31/2013   Allergic rhinitis 01/31/2013   Cervical radiculitis 04/17/2012   Cervical disc disease 04/17/2012   Paresthesia of arm 12/08/2011   Hypertension 12/08/2011   Myalgia 11/05/2011   B12 deficiency 11/05/2011   Fatigue 07/27/2011   COPD (chronic obstructive pulmonary disease) ([place]) 01/26/2011   Left knee pain 01/26/2011   Coronary atherosclerosis 02/17/2010   Hypokalemia 01/28/2010   ANEMIA-NOS 01/28/2010   CAROTID BRUIT 11/18/2009   Carotid bruit 11/18/2009   ERECTILE DYSFUNCTION 02/24/2009   Carpal tunnel syndrome 05/20/2008   PARESTHESIA 04/25/2008   Hyperlipidemia 02/07/2008   DEPRESSION 06/12/2007   LOW BACK PAIN 06/12/2007   Gastroesophageal reflux disease without esophagitis 06/09/2007    Past Medical History:  Diagnosis Date   Allergic rhinitis, cause unspecified 01/31/2013   ANEMIA-NOS 01/28/2010   BPH (benign prostatic hypertrophy) 04/25/2013   CAD 02/17/2010   CAROTID BRUIT 11/18/2009   Carpal tunnel syndrome 05/20/2008   Cervical radiculitis 04/17/2012   CHEST PAIN 09/26/2009   DEPRESSION 06/12/2007   DYSPNEA 07/28/2010   ERECTILE DYSFUNCTION 02/24/2009   GERD 06/09/2007   GERD (gastroesophageal reflux disease)    GLUCOSE  INTOLERANCE 01/28/2010   Gout 2017   Dr.John   HEMORRHOIDS 06/09/2007   History of colonic polyps 08/07/2015   HYPERLIPIDEMIA 02/07/2008   HYPERTENSION 06/09/2007   HYPOKALEMIA 01/28/2010   Impaired glucose tolerance 01/23/2011   LOW BACK PAIN 06/12/2007   MYOCARDIAL PERFUSION SCAN, WITH STRESS TEST, ABNORMAL 10/08/2009   PARESTHESIA 04/25/2008   SHOULDER PAIN, LEFT 04/25/2008   URI 08/19/2008   VITAMIN B12 DEFICIENCY 02/09/2010    Family History  Problem Relation Age of Onset   Hyperlipidemia Mother    Heart disease Mother    Hypertension Mother    Hyperlipidemia Father    Heart disease Father     Hypertension Father    Alcohol abuse Sister        ETOH dependence   Alcohol abuse Brother        ETOH  dependence   Heart disease Brother    Heart attack Other    Stroke Other    Hypertension Other    Coronary artery disease Other    Past Surgical History:  Procedure Laterality Date   COLONOSCOPY     CORONARY STENT PLACEMENT     from right radial   Social History   Social History Narrative   Lives in [place]. 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      Wife died 01/04/08   Immunization History  Administered Date(s) Administered   Influenza Split 07/27/2011   Influenza Whole 08/09/1997, 08/28/2009, 07/28/2010   Influenza, High Dose Seasonal PF 08/11/2017, 08/17/2018, 07/23/2019, 07/23/2019   Influenza,inj,Quad PF,6+ Mos 06/12/2014, 08/07/2015   Influenza-Unspecified 06/11/2013, 06/12/2014, 08/07/2015   PFIZER Comirnaty(Gray Top)Covid-19 Tri-Sucrose Vaccine 02/05/2020   PFIZER(Purple Top)SARS-COV-2 Vaccination 01/30/2020, 10/01/2020   Pneumococcal Conjugate-13 11/01/2013   Pneumococcal Polysaccharide-23 08/19/2008   Td 08/19/2008   Tdap 12/07/2018   Zoster, Live 01/31/2013     Objective: Vital Signs: BP (!) 155/77 (BP Location: Left Arm, Patient Position: Sitting, Cuff Size: Large)   Pulse 64   Ht 5' 8"  (1.727 m)   Wt 211 lb 6.4 oz (95.9 kg)   BMI 32.14 kg/m    Physical Exam Constitutional:      Appearance: He is obese.  Cardiovascular:     Rate and Rhythm: Normal rate and regular rhythm.  Pulmonary:     Effort: Pulmonary effort is normal.     Breath sounds: Normal breath sounds.  Musculoskeletal:     Comments: 1+ pedal edema b/l  Skin:    General: Skin is warm and dry.  Neurological:     Mental Status: He is alert.  Psychiatric:        Mood and Affect: Mood normal.     Musculoskeletal Exam:  Shoulders slightly decreased abduction ROM Elbows full ROM no tenderness or swelling Right wrist decreased flexion and particularly extension, left normal, no  palpable swelling Right index finger incomplete flexion ROM others appear grossly intact, no tenderness or palpable swelling, skin thickening distal to PIP joints on dorsal aspect, callused palms Knees no tenderness or effusions    Investigation: No additional findings.  Imaging: No results found.  Recent Labs: Lab Results  Component Value Date   WBC 5.6 04/27/2021   HGB 13.1 04/27/2021   PLT 180.0 04/27/2021   NA 144 04/27/2021   K 3.5 04/27/2021   CL 103 04/27/2021   CO2 34 (H) 04/27/2021   GLUCOSE 99 04/27/2021   BUN 17 04/27/2021   CREATININE 1.12 04/27/2021   BILITOT 0.5 04/27/2021   ALKPHOS 56 04/27/2021   AST  23 04/27/2021   ALT 19 04/27/2021   PROT 6.8 04/27/2021   ALBUMIN 4.1 04/27/2021   CALCIUM 9.9 04/27/2021   GFRAA >60 03/17/2016    Speciality Comments: No specialty comments available.  Procedures:  No procedures performed Allergies: Diclofenac sodium   Assessment / Plan:     Visit Diagnoses: Spinal stenosis of lumbar region with neurogenic claudication  His biggest complaint again today with the leg symptoms I believe is largely due to his severe lumbar spinal stenosis demonstrated on MRI imaging from earlier this year.  He apparently had discussed management options with a specialist but was not interested in surgical treatment at that time and did not have large improvement to local steroid injections.  I recommend that if symptoms are continuing to worsen he could revisit these options since I do not have any good medication recommendations for this problem.  Carpal tunnel syndrome, unspecified laterality Bilateral hand pain  Bilateral hand pain swelling and intermittent numbness combined with his abnormal labs may represent some inflammatory changes but currently very functional and this is not his biggest pain complaint.  Does not meet criteria of limited scleroderma at this time either.  After discussion plan for observation for now no new DMARD  treatment.   Orders: No orders of the defined types were placed in this encounter.  No orders of the defined types were placed in this encounter.    Follow-Up Instructions: Return in about 6 months (around 03/08/2022) for ?CTD/CTS/Radiculitis f/u 6 mos.   Collier Salina, MD  Note - This record has been created using [employer].  Chart creation errors have been sought, but may not always  have been located. Such creation errors do not reflect on  the standard of medical care.

## 2021-09-08 ENCOUNTER — Ambulatory Visit (INDEPENDENT_AMBULATORY_CARE_PROVIDER_SITE_OTHER): Payer: Medicare HMO | Admitting: Internal Medicine

## 2021-09-08 ENCOUNTER — Other Ambulatory Visit: Payer: Self-pay

## 2021-09-08 ENCOUNTER — Encounter: Payer: Self-pay | Admitting: Internal Medicine

## 2021-09-08 VITALS — BP 155/77 | HR 64 | Ht 68.0 in | Wt 211.4 lb

## 2021-09-08 DIAGNOSIS — M48062 Spinal stenosis, lumbar region with neurogenic claudication: Secondary | ICD-10-CM

## 2021-09-08 DIAGNOSIS — G5600 Carpal tunnel syndrome, unspecified upper limb: Secondary | ICD-10-CM | POA: Diagnosis not present

## 2021-09-08 DIAGNOSIS — M79642 Pain in left hand: Secondary | ICD-10-CM

## 2021-09-08 DIAGNOSIS — M79641 Pain in right hand: Secondary | ICD-10-CM

## 2021-10-06 DIAGNOSIS — M48061 Spinal stenosis, lumbar region without neurogenic claudication: Secondary | ICD-10-CM | POA: Diagnosis not present

## 2021-10-06 DIAGNOSIS — M5416 Radiculopathy, lumbar region: Secondary | ICD-10-CM | POA: Diagnosis not present

## 2021-10-06 DIAGNOSIS — M48062 Spinal stenosis, lumbar region with neurogenic claudication: Secondary | ICD-10-CM | POA: Diagnosis not present

## 2021-10-08 ENCOUNTER — Other Ambulatory Visit: Payer: Self-pay | Admitting: Internal Medicine

## 2021-10-08 NOTE — Telephone Encounter (Signed)
Please refill as per office routine med refill policy (all routine meds to be refilled for 3 mo or monthly (per pt preference) up to one year from last visit, then month to month grace period for 3 mo, then further med refills will have to be denied) ? ?

## 2021-10-16 DIAGNOSIS — M6281 Muscle weakness (generalized): Secondary | ICD-10-CM | POA: Diagnosis not present

## 2021-10-16 DIAGNOSIS — R262 Difficulty in walking, not elsewhere classified: Secondary | ICD-10-CM | POA: Diagnosis not present

## 2021-10-16 DIAGNOSIS — M545 Low back pain, unspecified: Secondary | ICD-10-CM | POA: Diagnosis not present

## 2021-10-27 ENCOUNTER — Encounter: Payer: Self-pay | Admitting: Internal Medicine

## 2021-10-27 ENCOUNTER — Ambulatory Visit (INDEPENDENT_AMBULATORY_CARE_PROVIDER_SITE_OTHER): Payer: Medicare HMO | Admitting: Internal Medicine

## 2021-10-27 ENCOUNTER — Other Ambulatory Visit: Payer: Self-pay

## 2021-10-27 VITALS — BP 138/64 | HR 69 | Ht 68.0 in | Wt 215.0 lb

## 2021-10-27 DIAGNOSIS — Z0000 Encounter for general adult medical examination without abnormal findings: Secondary | ICD-10-CM | POA: Diagnosis not present

## 2021-10-27 DIAGNOSIS — E538 Deficiency of other specified B group vitamins: Secondary | ICD-10-CM | POA: Diagnosis not present

## 2021-10-27 DIAGNOSIS — I10 Essential (primary) hypertension: Secondary | ICD-10-CM

## 2021-10-27 DIAGNOSIS — E782 Mixed hyperlipidemia: Secondary | ICD-10-CM

## 2021-10-27 DIAGNOSIS — E559 Vitamin D deficiency, unspecified: Secondary | ICD-10-CM | POA: Diagnosis not present

## 2021-10-27 DIAGNOSIS — E1165 Type 2 diabetes mellitus with hyperglycemia: Secondary | ICD-10-CM | POA: Diagnosis not present

## 2021-10-27 DIAGNOSIS — J309 Allergic rhinitis, unspecified: Secondary | ICD-10-CM

## 2021-10-27 DIAGNOSIS — Z0001 Encounter for general adult medical examination with abnormal findings: Secondary | ICD-10-CM

## 2021-10-27 LAB — LIPID PANEL
Cholesterol: 152 mg/dL (ref 0–200)
HDL: 30.3 mg/dL — ABNORMAL LOW (ref >39.00)
NonHDL: 121.55
Total CHOL/HDL Ratio: 5
Triglycerides: 367 mg/dL — ABNORMAL HIGH (ref 0.0–149.0)
VLDL: 73.4 mg/dL — ABNORMAL HIGH (ref 0.0–40.0)

## 2021-10-27 LAB — BASIC METABOLIC PANEL
BUN: 12 mg/dL (ref 6–23)
CO2: 33 mEq/L — ABNORMAL HIGH (ref 19–32)
Calcium: 9.8 mg/dL (ref 8.4–10.5)
Chloride: 102 mEq/L (ref 96–112)
Creatinine, Ser: 1.06 mg/dL (ref 0.40–1.50)
GFR: 66.59 mL/min (ref >60.00)
Glucose, Bld: 111 mg/dL — ABNORMAL HIGH (ref 70–99)
Potassium: 3.3 mEq/L — ABNORMAL LOW (ref 3.5–5.1)
Sodium: 142 mEq/L (ref 135–145)

## 2021-10-27 LAB — CBC WITH DIFFERENTIAL/PLATELET
Basophils Absolute: 0 10*3/uL (ref 0.0–0.1)
Basophils Relative: 0.4 % (ref 0.0–3.0)
Eosinophils Absolute: 0.1 10*3/uL (ref 0.0–0.7)
Eosinophils Relative: 2.2 % (ref 0.0–5.0)
HCT: 38.9 % — ABNORMAL LOW (ref 39.0–52.0)
Hemoglobin: 12.8 g/dL — ABNORMAL LOW (ref 13.0–17.0)
Lymphocytes Relative: 32.8 % (ref 12.0–46.0)
Lymphs Abs: 1.9 10*3/uL (ref 0.7–4.0)
MCHC: 32.8 g/dL (ref 30.0–36.0)
MCV: 89.7 fl (ref 78.0–100.0)
Monocytes Absolute: 0.6 10*3/uL (ref 0.1–1.0)
Monocytes Relative: 10.2 % (ref 3.0–12.0)
Neutro Abs: 3.1 10*3/uL (ref 1.4–7.7)
Neutrophils Relative %: 54.4 % (ref 43.0–77.0)
Platelets: 202 10*3/uL (ref 150.0–400.0)
RBC: 4.34 Mil/uL (ref 4.22–5.81)
RDW: 14.2 % (ref 11.5–15.5)
WBC: 5.7 10*3/uL (ref 4.0–10.5)

## 2021-10-27 LAB — HEPATIC FUNCTION PANEL
ALT: 16 U/L (ref 0–53)
AST: 20 U/L (ref 0–37)
Albumin: 3.9 g/dL (ref 3.5–5.2)
Alkaline Phosphatase: 56 U/L (ref 39–117)
Bilirubin, Direct: 0.1 mg/dL (ref 0.0–0.3)
Total Bilirubin: 0.4 mg/dL (ref 0.2–1.2)
Total Protein: 7.1 g/dL (ref 6.0–8.3)

## 2021-10-27 LAB — VITAMIN D 25 HYDROXY (VIT D DEFICIENCY, FRACTURES): VITD: 19.13 ng/mL — ABNORMAL LOW (ref 30.00–100.00)

## 2021-10-27 LAB — HEMOGLOBIN A1C: Hgb A1c MFr Bld: 6.5 % (ref 4.6–6.5)

## 2021-10-27 LAB — TSH: TSH: 2.57 u[IU]/mL (ref 0.35–5.50)

## 2021-10-27 LAB — LDL CHOLESTEROL, DIRECT: Direct LDL: 66 mg/dL

## 2021-10-27 LAB — VITAMIN B12: Vitamin B-12: 1238 pg/mL — ABNORMAL HIGH (ref 211–911)

## 2021-10-27 MED ORDER — METHYLPREDNISOLONE ACETATE 80 MG/ML IJ SUSP
80.0000 mg | Freq: Once | INTRAMUSCULAR | Status: AC
  Administered 2021-10-27: 80 mg via INTRAMUSCULAR

## 2021-10-27 MED ORDER — ROSUVASTATIN CALCIUM 10 MG PO TABS: ORAL_TABLET | ORAL | 3 refills | Status: DC

## 2021-10-27 NOTE — Patient Instructions (Signed)
You had the steroid shot today  Please continue all other medications as before, and refills have been done if requested.  Please have the pharmacy call with any other refills you may need.  Please continue your efforts at being more active, low cholesterol diet, and weight control.  You are otherwise up to date with prevention measures today.  Please keep your appointments with your specialists as you may have planned  Please go to the LAB at the blood drawing area for the tests to be done  You will be contacted by phone if any changes need to be made immediately.  Otherwise, you will receive a letter about your results with an explanation, but please check with MyChart first.  Please remember to sign up for MyChart if you have not done so, as this will be important to you in the future with finding out test results, communicating by private email, and scheduling acute appointments online when needed.  Please return in 6 months unless you are being seen at [facility].

## 2021-10-27 NOTE — Assessment & Plan Note (Signed)
Mild to mod uncontrolled flare, cont flonase, also for depomedrol IM 80,  to f/u any worsening symptoms or concerns

## 2021-10-27 NOTE — Progress Notes (Signed)
Patient ID: Travis Carey, male   DOB: 1940-10-16, 81 y.o.   MRN: [id number]         Chief Complaint:: wellness exam and allergies, DM, hld, htn, b12 deficiency       HPI:  Travis Carey is a 81 y.o. 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 Started PT last wk, improved already per pt.  .Does have several wks ongoing nasal allergy symptoms with clearish congestion, itch and sneezing, without fever, pain, ST, cough, swelling or wheezing.  Pt denies chest pain, increased sob or doe, wheezing, orthopnea, PND, increased LE swelling, palpitations, dizziness or syncope.   Pt denies polydipsia, polyuria, or new focal neuro changes.   Pt denies fever, wt loss, night sweats, loss of appetite, or other constitutional symptoms    Wt Readings from Last 3 Encounters:  10/27/21 215 lb (97.5 kg)  09/08/21 211 lb 6.4 oz (95.9 kg)  08/20/21 211 lb (95.7 kg)   BP Readings from Last 3 Encounters:  10/27/21 138/64  09/08/21 (!) 155/77  08/20/21 (!) 154/64   Immunization History  Administered Date(s) Administered   Influenza Split 07/27/2011   Influenza Whole 08/09/1997, 08/28/2009, 07/28/2010   Influenza, High Dose Seasonal PF 08/11/2017, 08/17/2018, 07/23/2019, 07/23/2019   Influenza,inj,Quad PF,6+ Mos 06/12/2014, 08/07/2015   Influenza-Unspecified 06/11/2013, 06/12/2014, 08/07/2015, 07/13/2021   PFIZER Comirnaty(Gray Top)Covid-19 Tri-Sucrose Vaccine 02/05/2020, 02/26/2020   PFIZER(Purple Top)SARS-COV-2 Vaccination 01/30/2020, 10/01/2020   Pneumococcal Conjugate-13 11/01/2013   Pneumococcal Polysaccharide-23 08/19/2008   Td 08/19/2008   Tdap 12/07/2018   Zoster, Live 01/31/2013  There are no preventive care reminders to display for this patient.    Past Medical History:  Diagnosis Date   Allergic rhinitis, cause unspecified 01/31/2013   ANEMIA-NOS 01/28/2010   BPH (benign prostatic  hypertrophy) 04/25/2013   CAD 02/17/2010   CAROTID BRUIT 11/18/2009   Carpal tunnel syndrome 05/20/2008   Cervical radiculitis 04/17/2012   CHEST PAIN 09/26/2009   DEPRESSION 06/12/2007   DYSPNEA 07/28/2010   ERECTILE DYSFUNCTION 02/24/2009   GERD 06/09/2007   GERD (gastroesophageal reflux disease)    GLUCOSE INTOLERANCE 01/28/2010   Gout 2017   Dr.Jayleon Mcfarlane   HEMORRHOIDS 06/09/2007   History of colonic polyps 08/07/2015   HYPERLIPIDEMIA 02/07/2008   HYPERTENSION 06/09/2007   HYPOKALEMIA 01/28/2010   Impaired glucose tolerance 01/23/2011   LOW BACK PAIN 06/12/2007   MYOCARDIAL PERFUSION SCAN, WITH STRESS TEST, ABNORMAL 10/08/2009   PARESTHESIA 04/25/2008   SHOULDER PAIN, LEFT 04/25/2008   URI 08/19/2008   VITAMIN B12 DEFICIENCY 02/09/2010   Past Surgical History:  Procedure Laterality Date   COLONOSCOPY     CORONARY STENT PLACEMENT     from right radial    reports that he has never smoked. He has never used smokeless tobacco. He reports that he does not drink alcohol and does not use drugs. family history includes Alcohol abuse in his brother and sister; Coronary artery disease in an other family member; Heart attack in an other family member; Heart disease in his brother, father, and mother; Hyperlipidemia in his father and mother; Hypertension in his father, mother, and another family member; Stroke in an other family member. Allergies  Allergen Reactions   Diclofenac Sodium     REACTION: maks pt drowsey   Current Outpatient Medications on File  Prior to Visit  Medication Sig Dispense Refill   amLODipine (NORVASC) 5 MG tablet Take 1 tablet (5 mg total) by mouth daily. 90 tablet 1   aspirin EC 81 MG tablet Take 81 mg by mouth daily.     carvedilol (COREG) 25 MG tablet Take 1 tablet (25 mg total) by mouth 2 (two) times daily with a meal. Must keep scheduled appt for March 3rd for future refills 30 tablet 0   dexlansoprazole (DEXILANT) 60 MG capsule Take 1 capsule (60 mg total) by mouth daily. 90  capsule 3   diclofenac sodium (VOLTAREN) 1 % GEL Apply 2 g topically 4 (four) times daily as needed. 100 g 5   gabapentin (NEURONTIN) 100 MG capsule TAKE 2 CAPSULES BY MOUTH THREE TIMES DAILY (Patient taking differently: prn) 180 capsule 1   HYDROcodone-acetaminophen (NORCO/VICODIN) 5-325 MG tablet Take 1 tablet by mouth every 6 (six) hours as needed for moderate pain. 30 tablet 0   lisinopril-hydrochlorothiazide (ZESTORETIC) 20-12.5 MG tablet Take 1 tablet by mouth once daily 90 tablet 3   meloxicam (MOBIC) 7.5 MG tablet TAKE 1 TABLET BY MOUTH ONCE DAILY AS NEEDED FOR PAIN 30 tablet 0   MITIGARE 0.6 MG CAPS Take 1 capsule by mouth once daily 90 capsule 0   potassium chloride (K-DUR) 10 MEQ tablet Take 2 tablets (20 mEq total) by mouth daily. 180 tablet 3   tamsulosin (FLOMAX) 0.4 MG CAPS capsule TAKE 1 CAPSULE BY MOUTH ONCE DAILY 90 capsule 1   clopidogrel (PLAVIX) 75 MG tablet  (Patient not taking: Reported on 10/27/2021)     fexofenadine (ALLEGRA) 180 MG tablet Take 1 tablet (180 mg total) by mouth daily. As needed (Patient not taking: Reported on 08/20/2021) 90 tablet 3   fluticasone (FLONASE) 50 MCG/ACT nasal spray USE TWO SPRAYS IN EACH NOSTRIL ONCE DAILY (Patient not taking: Reported on 08/20/2021) 48 g 5   pantoprazole (PROTONIX) 40 MG tablet Take 1 tablet (40 mg total) by mouth daily. 90 tablet 3   traMADol-acetaminophen (ULTRACET) 37.5-325 MG tablet Take 1 tablet by mouth every 6 (six) hours as needed. (Patient not taking: Reported on 08/20/2021) 60 tablet 2   No current facility-administered medications on file prior to visit.        ROS:  All others reviewed and negative.  Objective        PE:  BP 138/64 (BP Location: Left Arm, Patient Position: Sitting, Cuff Size: Large)    Pulse 69    Ht 5\' 8"  (1.727 m)    Wt 215 lb (97.5 kg)    SpO2 98%    BMI 32.69 kg/m                 Constitutional: Pt appears in NAD               HENT: Head: NCAT.                Right Ear: External ear  normal.                 Left Ear: External ear normal. Bilat tm's with mild erythema.  Max sinus areas non tender.  Pharynx with mild erythema, no exudate               Eyes: . Pupils are equal, round, and reactive to light. Conjunctivae and EOM are normal               Nose: without d/c or deformity  Neck: Neck supple. Gross normal ROM               Cardiovascular: Normal rate and regular rhythm.                 Pulmonary/Chest: Effort normal and breath sounds without rales or wheezing.                Abd:  Soft, NT, ND, + BS, no organomegaly               Neurological: Pt is alert. At baseline orientation, motor grossly intact               Skin: Skin is warm. No rashes, no other new lesions, LE edema - none               Psychiatric: Pt behavior is normal without agitation   Micro: none  Cardiac tracings I have personally interpreted today:  none  Pertinent Radiological findings (summarize): none   Lab Results  Component Value Date   WBC 5.7 10/27/2021   HGB 12.8 (L) 10/27/2021   HCT 38.9 (L) 10/27/2021   PLT 202.0 10/27/2021   GLUCOSE 111 (H) 10/27/2021   CHOL 152 10/27/2021   TRIG 367.0 (H) 10/27/2021   HDL 30.30 (L) 10/27/2021   LDLDIRECT 66.0 10/27/2021   LDLCALC 71 12/07/2018   ALT 16 10/27/2021   AST 20 10/27/2021   NA 142 10/27/2021   K 3.3 (L) 10/27/2021   CL 102 10/27/2021   CREATININE 1.06 10/27/2021   BUN 12 10/27/2021   CO2 33 (H) 10/27/2021   TSH 2.57 10/27/2021   PSA 0.38 04/27/2021   INR 1.0 ratio 10/23/2009   HGBA1C 6.5 10/27/2021   MICROALBUR 1.1 10/30/2021   Assessment/Plan:  MATIX HENSHAW is a 81 y.o. Black or African American [2] male with  has a past medical history of Allergic rhinitis, cause unspecified (01/31/2013), ANEMIA-NOS (01/28/2010), BPH (benign prostatic hypertrophy) (04/25/2013), CAD (02/17/2010), CAROTID BRUIT (11/18/2009), Carpal tunnel syndrome (05/20/2008), Cervical radiculitis (04/17/2012), CHEST PAIN (09/26/2009), DEPRESSION  (06/12/2007), DYSPNEA (07/28/2010), ERECTILE DYSFUNCTION (02/24/2009), GERD (06/09/2007), GERD (gastroesophageal reflux disease), GLUCOSE INTOLERANCE (01/28/2010), Gout (2017), HEMORRHOIDS (06/09/2007), History of colonic polyps (08/07/2015), HYPERLIPIDEMIA (02/07/2008), HYPERTENSION (06/09/2007), HYPOKALEMIA (01/28/2010), Impaired glucose tolerance (01/23/2011), LOW BACK PAIN (06/12/2007), MYOCARDIAL PERFUSION SCAN, WITH STRESS TEST, ABNORMAL (10/08/2009), PARESTHESIA (04/25/2008), SHOULDER PAIN, LEFT (04/25/2008), URI (08/19/2008), and VITAMIN B12 DEFICIENCY (02/09/2010).  Allergic rhinitis Mild to mod uncontrolled flare, cont flonase, also for depomedrol IM 80,  to f/u any worsening symptoms or concerns  Encounter for well adult exam with abnormal findings Age and sex appropriate education and counseling updated with regular exercise and diet Referrals for preventative services - declines colonoscopy Immunizations addressed - declines covid booster, shingirx Smoking counseling  - none needed Evidence for depression or other mood disorder - none significant Most recent labs reviewed. I have personally reviewed and have noted: 1) the patient's medical and social history 2) The patient's current medications and supplements 3) The patient's height, weight, and BMI have been recorded in the chart   B12 deficiency Lab Results  Component Value Date   VITAMINB12 1,238 (H) 10/27/2021   Stable, cont oral replacement - b12 1000 mcg qd   Diabetes ([place]) Lab Results  Component Value Date   HGBA1C 6.5 10/27/2021   Stable, pt to continue current medical treatment  - diet   Hyperlipidemia Lab Results  Component Value Date   LDLCALC 71 12/07/2018   Uncontrolled mild, goal ldl <  70, pt to continue current statin crestor 10 as declines change for now   Hypertension BP Readings from Last 3 Encounters:  10/27/21 138/64  09/08/21 (!) 155/77  08/20/21 (!) 154/64   Stable, pt to continue medical treatment  norvasc, coreg, zestoretic   Vitamin D deficiency Last vitamin D Lab Results  Component Value Date   VD25OH 19.13 (L) 10/27/2021   Low, to start oral replacement  Followup: Return in about 6 months (around 04/26/2022).  Cathlean Cower, MD 11/01/2021 11:38 AM [facility]

## 2021-10-30 ENCOUNTER — Other Ambulatory Visit (INDEPENDENT_AMBULATORY_CARE_PROVIDER_SITE_OTHER): Payer: Medicare HMO

## 2021-10-30 DIAGNOSIS — E1165 Type 2 diabetes mellitus with hyperglycemia: Secondary | ICD-10-CM

## 2021-10-30 LAB — URINALYSIS, ROUTINE W REFLEX MICROSCOPIC
Bilirubin Urine: NEGATIVE
Hgb urine dipstick: NEGATIVE
Ketones, ur: NEGATIVE
Leukocytes,Ua: NEGATIVE
Nitrite: NEGATIVE
Specific Gravity, Urine: 1.01 (ref 1.000–1.030)
Total Protein, Urine: NEGATIVE
Urine Glucose: NEGATIVE
Urobilinogen, UA: 0.2 (ref 0.0–1.0)
pH: 7.5 (ref 5.0–8.0)

## 2021-10-30 LAB — MICROALBUMIN / CREATININE URINE RATIO
Creatinine,U: 103.9 mg/dL
Microalb Creat Ratio: 1.1 mg/g (ref 0.0–30.0)
Microalb, Ur: 1.1 mg/dL (ref 0.0–1.9)

## 2021-11-01 ENCOUNTER — Encounter: Payer: Self-pay | Admitting: Internal Medicine

## 2021-11-01 DIAGNOSIS — E559 Vitamin D deficiency, unspecified: Secondary | ICD-10-CM | POA: Insufficient documentation

## 2021-11-01 NOTE — Assessment & Plan Note (Signed)
Lab Results  Component Value Date   VITAMINB12 1,238 (H) 10/27/2021   Stable, cont oral replacement - b12 1000 mcg qd

## 2021-11-01 NOTE — Assessment & Plan Note (Signed)
Lab Results  Component Value Date   HGBA1C 6.5 10/27/2021   Stable, pt to continue current medical treatment  - diet

## 2021-11-01 NOTE — Assessment & Plan Note (Signed)
Age and sex appropriate education and counseling updated with regular exercise and diet Referrals for preventative services - declines colonoscopy Immunizations addressed - declines covid booster, shingirx Smoking counseling  - none needed Evidence for depression or other mood disorder - none significant Most recent labs reviewed. I have personally reviewed and have noted: 1) the patient's medical and social history 2) The patient's current medications and supplements 3) The patient's height, weight, and BMI have been recorded in the chart

## 2021-11-01 NOTE — Assessment & Plan Note (Signed)
BP Readings from Last 3 Encounters:  10/27/21 138/64  09/08/21 (!) 155/77  08/20/21 (!) 154/64   Stable, pt to continue medical treatment norvasc, coreg, zestoretic

## 2021-11-01 NOTE — Assessment & Plan Note (Signed)
Lab Results  Component Value Date   LDLCALC 71 12/07/2018   Uncontrolled mild, goal ldl < 70, pt to continue current statin crestor 10 as declines change for now

## 2021-11-01 NOTE — Assessment & Plan Note (Signed)
Last vitamin D Lab Results  Component Value Date   VD25OH 19.13 (L) 10/27/2021   Low, to start oral replacement

## 2021-11-03 ENCOUNTER — Ambulatory Visit: Payer: Medicare HMO | Admitting: Internal Medicine

## 2021-11-03 ENCOUNTER — Other Ambulatory Visit: Payer: Self-pay | Admitting: Internal Medicine

## 2021-11-03 NOTE — Telephone Encounter (Signed)
Please refill as per office routine med refill policy (all routine meds to be refilled for 3 mo or monthly (per pt preference) up to one year from last visit, then month to month grace period for 3 mo, then further med refills will have to be denied) ? ?

## 2021-11-12 ENCOUNTER — Telehealth: Payer: Self-pay | Admitting: Internal Medicine

## 2021-11-12 NOTE — Telephone Encounter (Signed)
1.Medication Requested: lisinopril-hydrochlorothiazide (ZESTORETIC) 20-12.5 MG tablet  2. Pharmacy (Name, [facility], [place]): [facility] [place] - [address]  3. On Med List: yes   4. Last Visit with PCP: 10-27-2021  5. Next visit date with PCP: n/a   Patient is out of medication

## 2021-11-12 NOTE — Telephone Encounter (Signed)
Please refill as per office routine med refill policy (all routine meds to be refilled for 3 mo or monthly (per pt preference) up to one year from last visit, then month to month grace period for 3 mo, then further med refills will have to be denied) ? ?

## 2021-11-13 MED ORDER — LISINOPRIL-HYDROCHLOROTHIAZIDE 20-12.5 MG PO TABS: 1.0000 | ORAL_TABLET | Freq: Every day | ORAL | 3 refills | Status: DC

## 2021-12-04 ENCOUNTER — Emergency Department: Payer: Medicare HMO

## 2021-12-04 ENCOUNTER — Other Ambulatory Visit: Payer: Self-pay

## 2021-12-04 ENCOUNTER — Emergency Department
Admission: EM | Admit: 2021-12-04 | Discharge: 2021-12-04 | Disposition: A | Discharge status: Home / Self Care | Payer: Medicare HMO | Attending: Emergency Medicine | Admitting: Emergency Medicine

## 2021-12-04 DIAGNOSIS — J209 Acute bronchitis, unspecified: Secondary | ICD-10-CM | POA: Insufficient documentation

## 2021-12-04 DIAGNOSIS — H538 Other visual disturbances: Secondary | ICD-10-CM | POA: Diagnosis not present

## 2021-12-04 DIAGNOSIS — R7989 Other specified abnormal findings of blood chemistry: Secondary | ICD-10-CM | POA: Diagnosis not present

## 2021-12-04 DIAGNOSIS — H539 Unspecified visual disturbance: Secondary | ICD-10-CM

## 2021-12-04 DIAGNOSIS — E876 Hypokalemia: Secondary | ICD-10-CM | POA: Insufficient documentation

## 2021-12-04 DIAGNOSIS — J0110 Acute frontal sinusitis, unspecified: Secondary | ICD-10-CM | POA: Insufficient documentation

## 2021-12-04 DIAGNOSIS — H532 Diplopia: Secondary | ICD-10-CM | POA: Diagnosis present

## 2021-12-04 DIAGNOSIS — I2510 Atherosclerotic heart disease of native coronary artery without angina pectoris: Secondary | ICD-10-CM | POA: Insufficient documentation

## 2021-12-04 DIAGNOSIS — R0602 Shortness of breath: Secondary | ICD-10-CM | POA: Diagnosis not present

## 2021-12-04 DIAGNOSIS — I10 Essential (primary) hypertension: Secondary | ICD-10-CM | POA: Insufficient documentation

## 2021-12-04 DIAGNOSIS — J40 Bronchitis, not specified as acute or chronic: Secondary | ICD-10-CM

## 2021-12-04 DIAGNOSIS — R778 Other specified abnormalities of plasma proteins: Secondary | ICD-10-CM | POA: Diagnosis not present

## 2021-12-04 DIAGNOSIS — J019 Acute sinusitis, unspecified: Secondary | ICD-10-CM

## 2021-12-04 DIAGNOSIS — Z20822 Contact with and (suspected) exposure to covid-19: Secondary | ICD-10-CM | POA: Diagnosis not present

## 2021-12-04 LAB — CBC
HCT: 38.4 % — ABNORMAL LOW (ref 39.0–52.0)
Hemoglobin: 12.5 g/dL — ABNORMAL LOW (ref 13.0–17.0)
MCH: 29.5 pg (ref 26.0–34.0)
MCHC: 32.6 g/dL (ref 30.0–36.0)
MCV: 90.6 fL (ref 80.0–100.0)
Platelets: 202 10*3/uL (ref 150–400)
RBC: 4.24 MIL/uL (ref 4.22–5.81)
RDW: 14.3 % (ref 11.5–15.5)
WBC: 6 10*3/uL (ref 4.0–10.5)
nRBC: 0 % (ref 0.0–0.2)

## 2021-12-04 LAB — BASIC METABOLIC PANEL
Anion gap: 7 (ref 5–15)
BUN: 15 mg/dL (ref 8–23)
CO2: 30 mmol/L (ref 22–32)
Calcium: 9.3 mg/dL (ref 8.9–10.3)
Chloride: 103 mmol/L (ref 98–111)
Creatinine, Ser: 1.03 mg/dL (ref 0.61–1.24)
GFR, Estimated: >60 mL/min (ref >60)
Glucose, Bld: 89 mg/dL (ref 70–99)
Potassium: 3.1 mmol/L — ABNORMAL LOW (ref 3.5–5.1)
Sodium: 140 mmol/L (ref 135–145)

## 2021-12-04 LAB — TROPONIN I (HIGH SENSITIVITY)
Troponin I (High Sensitivity): 47 ng/L — ABNORMAL HIGH (ref <18)
Troponin I (High Sensitivity): 51 ng/L — ABNORMAL HIGH (ref <18)

## 2021-12-04 LAB — RESP PANEL BY RT-PCR (FLU A&B, COVID) ARPGX2
Influenza A by PCR: NEGATIVE
Influenza B by PCR: NEGATIVE
SARS Coronavirus 2 by RT PCR: NEGATIVE

## 2021-12-04 LAB — BRAIN NATRIURETIC PEPTIDE: B Natriuretic Peptide: 119.7 pg/mL — ABNORMAL HIGH (ref 0.0–100.0)

## 2021-12-04 LAB — MAGNESIUM: Magnesium: 2 mg/dL (ref 1.7–2.4)

## 2021-12-04 MED ORDER — POTASSIUM CHLORIDE CRYS ER 20 MEQ PO TBCR
40.0000 meq | EXTENDED_RELEASE_TABLET | Freq: Once | ORAL | Status: AC
  Administered 2021-12-04: 40 meq via ORAL
  Filled 2021-12-04: qty 2

## 2021-12-04 MED ORDER — AMOXICILLIN-POT CLAVULANATE 875-125 MG PO TABS: 1.0000 | ORAL_TABLET | Freq: Two times a day (BID) | ORAL | 0 refills | Status: AC

## 2021-12-04 MED ORDER — GADOBUTROL 1 MMOL/ML IV SOLN
10.0000 mL | Freq: Once | INTRAVENOUS | Status: AC | PRN
  Administered 2021-12-04: 10 mL via INTRAVENOUS
  Filled 2021-12-04: qty 10

## 2021-12-04 NOTE — ED Triage Notes (Signed)
Pt to ED via POV from home. Pt states he has had a head cold x3 weeks. Pt reports increased BP, SOB and cough. Wife states today he also lost his train of thought and had an episode blurry vision and HA 2-3hrs PTA. Pt states hx stroke with no residual deficits. Pt A&Ox3.

## 2021-12-04 NOTE — ED Notes (Signed)
E-signature pad not functional. 

## 2021-12-04 NOTE — ED Provider Notes (Signed)
[facility] Provider Note    Event Date/Time   First MD Initiated Contact with Patient 12/04/21 1632     (approximate)   History   Headache (Blurry vision)   HPI  Travis Carey is a 81 y.o. male with a past medical history of CAD, HTN, HDL, GERD, anemia, BPH, chronic back pain, positive ANA, who presents for evaluation of some blurry and double vision that started this morning around 10 AM in the setting of 3 to 4 weeks of some frontal bilateral headache associated with congestion and cough.  No fever, earache, sore throat, chest pain, shortness of breath, back pain, abdominal pain, vomiting, diarrhea, rash or extremity pain.  Denies any other clear associated symptoms.  Denies any blood thinners and states his doctors took him off ASA and Plavix which she had previously been on.  No other acute concerns at this time.  He does not wear glasses or contacts.  He denies any actual visual pain.    Past Medical History:  Diagnosis Date   Allergic rhinitis, cause unspecified 01/31/2013   ANEMIA-NOS 01/28/2010   BPH (benign prostatic hypertrophy) 04/25/2013   CAD 02/17/2010   CAROTID BRUIT 11/18/2009   Carpal tunnel syndrome 05/20/2008   Cervical radiculitis 04/17/2012   CHEST PAIN 09/26/2009   DEPRESSION 06/12/2007   DYSPNEA 07/28/2010   ERECTILE DYSFUNCTION 02/24/2009   GERD 06/09/2007   GERD (gastroesophageal reflux disease)    GLUCOSE INTOLERANCE 01/28/2010   Gout 2017   Dr.John   HEMORRHOIDS 06/09/2007   History of colonic polyps 08/07/2015   HYPERLIPIDEMIA 02/07/2008   HYPERTENSION 06/09/2007   HYPOKALEMIA 01/28/2010   Impaired glucose tolerance 01/23/2011   LOW BACK PAIN 06/12/2007   MYOCARDIAL PERFUSION SCAN, WITH STRESS TEST, ABNORMAL 10/08/2009   PARESTHESIA 04/25/2008   SHOULDER PAIN, LEFT 04/25/2008   URI 08/19/2008   VITAMIN B12 DEFICIENCY 02/09/2010      Physical Exam  Triage Vital Signs: ED Triage Vitals  Enc Vitals Group     BP 12/04/21 1517 (!) 155/76      Pulse Rate 12/04/21 1517 64     Resp 12/04/21 1517 18     Temp 12/04/21 1517 98.1 F (36.7 C)     Temp Source 12/04/21 1517 Oral     SpO2 12/04/21 1517 96 %     Weight --      Height 12/04/21 1512 5\' 8"  (1.727 m)     Head Circumference --      Peak Flow --      Pain Score 12/04/21 1512 0     Pain Loc --      Pain Edu? --      Excl. in [place]? --     Most recent vital signs: Vitals:   12/04/21 1517 12/04/21 1720  BP: (!) 155/76 (!) 172/77  Pulse: 64 (!) 57  Resp: 18 18  Temp: 98.1 F (36.7 C)   SpO2: 96% 98%    General: Awake, no distress.  CV:  Good peripheral perfusion.  2+ radial pulse. Resp:  Normal effort.  Clear bilaterally. Abd:  No distention.  Soft. Other:  Symmetric lower extremity edema. Cranial nerves II through XII grossly intact.  No pronator drift.  No finger dysmetria.  Symmetric 5/5 strength of all extremities.  Sensation intact to light touch in all extremities.     ED Results / Procedures / Treatments  Labs (all labs ordered are listed, but only abnormal results are displayed) Labs Reviewed  CBC -  Abnormal; Notable for the following components:      Result Value   Hemoglobin 12.5 (*)    HCT 38.4 (*)    All other components within normal limits  BASIC METABOLIC PANEL - Abnormal; Notable for the following components:   Potassium 3.1 (*)    All other components within normal limits  BRAIN NATRIURETIC PEPTIDE - Abnormal; Notable for the following components:   B Natriuretic Peptide 119.7 (*)    All other components within normal limits  TROPONIN I (HIGH SENSITIVITY) - Abnormal; Notable for the following components:   Troponin I (High Sensitivity) 47 (*)    All other components within normal limits  TROPONIN I (HIGH SENSITIVITY) - Abnormal; Notable for the following components:   Troponin I (High Sensitivity) 51 (*)    All other components within normal limits  RESP PANEL BY RT-PCR (FLU A&B, COVID) ARPGX2  MAGNESIUM     EKG  ECG is  remarkable for sinus rhythm with first-degree AV block with a PR interval of 220, left anterior fascicle block and some nonspecific ST changes in leads I and aVF nonspecific change in lead III without other clear evidence of acute ischemia or significant arrhythmia.   RADIOLOGY CT head on my interpretation shows no large acute ischemic areas, hemorrhage, abscess or other clear acute process.  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  Chest x-ray without focal consolidation, effusion, edema, pneumothorax or any other clear acute thoracic process.   PROCEDURES:  Critical Care performed: No  Procedures    MEDICATIONS ORDERED IN ED: Medications  potassium chloride SA (KLOR-CON M) CR tablet 40 mEq (40 mEq Oral Given 12/04/21 1712)  gadobutrol (GADAVIST) 1 MMOL/ML injection 10 mL (10 mLs Intravenous Contrast Given 12/04/21 2000)     IMPRESSION / MDM / ASSESSMENT AND PLAN / ED COURSE  I reviewed the triage vital signs and the nursing notes.                              Differential diagnosis includes, but is not limited to sinusitis with component of bronchitis with possible component of pneumonia, CHF, postnasal drip, GERD, arrhythmia, pneumonia metabolic emergency and ACS.  With regard to his vision complaints today I do not see evidence of orbital cellulitis, there is no evidence of objective CVA additional considerations include possible sinus invasion into the cavernous sinus possibly affecting cranial nerves.  CT head on my interpretation shows no large acute ischemic areas, hemorrhage, abscess or other clear acute process.  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  Chest x-ray without focal consolidation, effusion, edema, pneumothorax or any other clear  acute thoracic process.  Discussed with on-call neurologist Dr. Leonel Ramsay who recommended MRI brain as well as MR orbits.  CBC shows no acidosis or acute anemia.  BMP shows a K of 3.1 without any other significant electrolyte or metabolic derangements.  COVID and influenza PCR is negative.  Magnesium is 2.  Troponins are elevated but stable at 47 and 51 which I suspect reflects some mild demand ischemia with a lower suspicion for occlusion MI especially since patient denies any pain.  This only could be related to some high blood pressure although will defer treating this pending rule out of ischemic stroke.  BNP is only  slightly elevated at 119 and overall patient does not appear significantly volume overloaded on chest imaging and have a low suspicion for decompensated heart failure as cause of his cough.  Patient signed over to assuming father approximately 8 PM.  Plan to follow-up MRI and reassess.  If MRIs are normal patient can likely be discharged home with a course of antibiotics for some subacute sinusitis of bronchitis.  He will also have to have his blood pressure rechecked by PCP.      FINAL CLINICAL IMPRESSION(S) / ED DIAGNOSES   Final diagnoses:  Acute non-recurrent sinusitis, unspecified location  Bronchitis  Hypertension, unspecified type  Vision changes  Subacute frontal sinusitis     Rx / DC Orders   ED Discharge Orders     None        Note:  This document was prepared using Dragon voice recognition software and may include unintentional dictation errors.   Lucrezia Starch, MD 12/04/21 2002

## 2021-12-04 NOTE — Discharge Instructions (Addendum)
Take Augmentin twice daily for seven days.  Please follow up with PCP about hypertension.

## 2021-12-04 NOTE — ED Notes (Signed)
Patient ambulated to hallway bathroom with a slow, steady gait. Patient changed into gown, cardiac stickers removed.

## 2021-12-04 NOTE — ED Notes (Signed)
Patient is alert and oriented, no deficits noted at this time. Patient is talking to MRI tech on the phone at this time.

## 2021-12-04 NOTE — ED Provider Notes (Signed)
°  Physical Exam  BP (!) 172/77 (BP Location: Right Arm)    Pulse (!) 57    Temp 98.1 F (36.7 C) (Oral)    Resp 18    Ht 5\' 8"  (1.727 m)    SpO2 98%    BMI 32.69 kg/m   Physical Exam  Procedures  Procedures  ED Course / MDM    Medical Decision Making Amount and/or Complexity of Data Reviewed Labs: ordered. Radiology: ordered.  Risk Prescription drug management.   Assumed care from Charlann Boxer, MD.  No acute abnormalities on MRI orbits and MRI brain.  Will discharge patient with Augmentin for sinusitis and recommend the patient follow-up with his PCP regarding hypertension noted in the emergency department.       Vallarie Mare Bruno, Hershal Coria 12/04/21 2028    Lucrezia Starch, MD 12/04/21 2141

## 2022-01-25 NOTE — Progress Notes (Deleted)
Cardiology Office Note  Date:  01/25/2022   ID:  LOYS SHUGARS, DOB Jan 24, 1941, MRN [id number]  PCP:  Biagio Borg, MD   No chief complaint on file.   HPI:  Mr. Kortz is a 81 year old gentleman with  coronary artery disease, stent to the RCA,  hyperlipidemia  H/o unstable angina with stenting of the RCA on 10/31/2009.   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  LOV 01/2017 Has been followed at [facility], would like to switch back to [facility]  Last echocardiogram November 2020    On his last clinic visit he reported having chest discomfort in the afternoon's typically after lunch Symptoms seem to get better with antacids On today's visit he reports having discomfort in the mornings He eats large bowl of cereal followed by high-volume coffee  Lab work reviewed with him in detail LDL 49, total chol 149, HBA1C 6.4 Potassium 3.3, he is on HCTZ daily Supposed to take 2 potassiums per day, sometimes only takes 1  Denies any TIA or stroke type symptoms Retiring this summer No regular exercise program   EKG personally reviewed by myself on todays visit Shows normal sinus rhythm with rate 59 bpm left anterior fascicular block, no significant ST or T-wave changes   Other past medical history reviewed Previous stroke symptoms, evaluated in the hospital in June 2016 He had slurred speech, difficulty swallowing  Echocardiogram was essentially normal Discharged with aspirin and Plavix   Denies any significant  palpitations, tachycardia   EKG on today's visit shows normal sinus rhythm with right bundle branch block, left anterior fascicular block, rate 59 bpm   Other past medical history He works as a Dealer. He continues to have hand swelling, possible carpal tunnel issues.   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  Has several dogs at home   Prior lab work showing total cholesterol 153, LDL 89 which is higher from previous lab work     PMH:   has a past medical history of Allergic rhinitis, cause unspecified (01/31/2013), ANEMIA-NOS (01/28/2010), BPH (benign prostatic hypertrophy) (04/25/2013), CAD (02/17/2010), CAROTID BRUIT (11/18/2009), Carpal tunnel syndrome (05/20/2008), Cervical radiculitis (04/17/2012), CHEST PAIN (09/26/2009), DEPRESSION (06/12/2007), DYSPNEA (07/28/2010), ERECTILE DYSFUNCTION (02/24/2009), GERD (06/09/2007), GERD (gastroesophageal reflux disease), GLUCOSE INTOLERANCE (01/28/2010), Gout (2017), HEMORRHOIDS (06/09/2007), History of colonic polyps (08/07/2015), HYPERLIPIDEMIA (02/07/2008), HYPERTENSION (06/09/2007), HYPOKALEMIA (01/28/2010), Impaired glucose tolerance (01/23/2011), LOW BACK PAIN (06/12/2007), MYOCARDIAL PERFUSION SCAN, WITH STRESS TEST, ABNORMAL (10/08/2009), PARESTHESIA (04/25/2008), SHOULDER PAIN, LEFT (04/25/2008), URI (08/19/2008), and VITAMIN B12 DEFICIENCY (02/09/2010).  PSH:    Past Surgical History:  Procedure Laterality Date   COLONOSCOPY     CORONARY STENT PLACEMENT     from right radial    Current Outpatient Medications  Medication Sig Dispense Refill   amLODipine (NORVASC) 5 MG tablet Take 1 tablet by mouth once daily 90 tablet 3   aspirin EC 81 MG tablet Take 81 mg by mouth daily.     carvedilol (COREG) 25 MG tablet Take 1 tablet (25 mg total) by mouth 2 (two) times daily with a meal. Must keep scheduled appt for March 3rd for future refills 30 tablet 0   clopidogrel (PLAVIX) 75 MG tablet  (Patient not taking: Reported on 10/27/2021)     dexlansoprazole ([place]) 60  MG capsule Take 1 capsule (60 mg total) by mouth daily. 90 capsule 3   diclofenac sodium (VOLTAREN) 1 % GEL Apply 2 g topically 4 (four) times daily as needed. 100 g 5   fexofenadine (ALLEGRA) 180 MG tablet Take 1 tablet (180 mg total) by  mouth daily. As needed (Patient not taking: Reported on 08/20/2021) 90 tablet 3   fluticasone (FLONASE) 50 MCG/ACT nasal spray USE TWO SPRAYS IN EACH NOSTRIL ONCE DAILY (Patient not taking: Reported on 08/20/2021) 48 g 5   gabapentin (NEURONTIN) 100 MG capsule TAKE 2 CAPSULES BY MOUTH THREE TIMES DAILY (Patient taking differently: prn) 180 capsule 1   HYDROcodone-acetaminophen (NORCO/VICODIN) 5-325 MG tablet Take 1 tablet by mouth every 6 (six) hours as needed for moderate pain. 30 tablet 0   lisinopril-hydrochlorothiazide (ZESTORETIC) 20-12.5 MG tablet Take 1 tablet by mouth daily. 90 tablet 3   meloxicam (MOBIC) 7.5 MG tablet TAKE 1 TABLET BY MOUTH ONCE DAILY AS NEEDED FOR PAIN 30 tablet 0   MITIGARE 0.6 MG CAPS Take 1 capsule by mouth once daily 90 capsule 0   pantoprazole (PROTONIX) 40 MG tablet Take 1 tablet (40 mg total) by mouth daily. 90 tablet 3   potassium chloride (K-DUR) 10 MEQ tablet Take 2 tablets (20 mEq total) by mouth daily. 180 tablet 3   rosuvastatin (CRESTOR) 10 MG tablet TAKE 1 TABLET BY MOUTH ONCE DAILY. ANNUAL APPOINTMENT IS DUE. MUST SEE PROVIDER FOR FURTHER REFILLS. 90 tablet 3   tamsulosin (FLOMAX) 0.4 MG CAPS capsule TAKE 1 CAPSULE BY MOUTH ONCE DAILY 90 capsule 1   traMADol-acetaminophen (ULTRACET) 37.5-325 MG tablet Take 1 tablet by mouth every 6 (six) hours as needed. (Patient not taking: Reported on 08/20/2021) 60 tablet 2   No current facility-administered medications for this visit.     Allergies:   Diclofenac sodium   Social History:  The patient  reports that he has never smoked. He has never used smokeless tobacco. He reports that he does not drink alcohol and does not use drugs.   Family History:   family history includes Alcohol abuse in his brother and sister; Coronary artery disease in an other family member; Heart attack in an other family member; Heart disease in his brother, father, and mother; Hyperlipidemia in his father and mother; Hypertension in  his father, mother, and another family member; Stroke in an other family member.    Review of Systems: Review of Systems  Constitutional: Negative.   Respiratory: Negative.    Cardiovascular: Negative.   Gastrointestinal:  Positive for heartburn.  Musculoskeletal: Negative.   Neurological: Negative.   Psychiatric/Behavioral: Negative.    All other systems reviewed and are negative.   PHYSICAL EXAM: VS:  There were no vitals taken for this visit. , BMI There is no height or weight on file to calculate BMI. GEN: Well nourished, well developed, in no acute distress  HEENT: normal  Neck: no JVD, carotid bruits, or masses Cardiac: RRR; no murmurs, rubs, or gallops,no edema  Respiratory:  clear to auscultation bilaterally, normal work of breathing GI: soft, nontender, nondistended, + BS MS: no deformity or atrophy  Skin: warm and dry, no rash Neuro:  Strength and sensation are intact Psych: euthymic mood, full affect    Recent Labs: 10/27/2021: ALT 16; TSH 2.57 12/04/2021: B Natriuretic Peptide 119.7; BUN 15; Creatinine, Ser 1.03; Hemoglobin 12.5; Magnesium 2.0; Platelets 202; Potassium 3.1; Sodium 140    Lipid Panel Lab Results  Component Value Date   CHOL 152  10/27/2021   HDL 30.30 (L) 10/27/2021   LDLCALC 71 12/07/2018   TRIG 367.0 (H) 10/27/2021      Wt Readings from Last 3 Encounters:  10/27/21 97.5 kg  09/08/21 95.9 kg  08/20/21 95.7 kg       ASSESSMENT AND PLAN:  Atherosclerosis of native coronary artery of native heart with stable angina pectoris ([place]) - Plan: EKG 12-Lead Currently with no symptoms of angina. No further workup at this time. Continue current medication regimen.  Essential hypertension - Plan: EKG 12-Lead Blood pressure high end of normal range Recommended exercise, weight loss, monitoring blood pressure at home Collis numbers continue to run high  Mixed hyperlipidemia - Plan: EKG 12-Lead Goal LDL less than 70 His LDL is 49  Other  emphysema (HCC) - Plan: EKG 12-Lead We have encouraged him to continue to work on weaning his cigarettes and smoking cessation. He will continue to work on this and does not want any assistance with chantix.   Gastroesophageal reflux disease without esophagitis long discussion concerning his symptoms . They do not sound cardiac in nature  Recommended he continue Pepcid and Tums   if he would like he could try omeprazole   Total encounter time more than 25 minutes  Greater than 50% was spent in counseling and coordination of care with the patient  Disposition:   F/U  12 months   No orders of the defined types were placed in this encounter.    Signed, Esmond Plants, M.D., Ph.D. 01/25/2022  [place], [place]

## 2022-01-26 ENCOUNTER — Ambulatory Visit: Payer: Medicare HMO | Admitting: Cardiovascular Disease

## 2022-01-26 DIAGNOSIS — G459 Transient cerebral ischemic attack, unspecified: Secondary | ICD-10-CM

## 2022-01-26 DIAGNOSIS — I25118 Atherosclerotic heart disease of native coronary artery with other forms of angina pectoris: Secondary | ICD-10-CM

## 2022-01-26 DIAGNOSIS — E782 Mixed hyperlipidemia: Secondary | ICD-10-CM

## 2022-01-27 ENCOUNTER — Telehealth: Payer: Self-pay

## 2022-01-27 MED ORDER — ROSUVASTATIN CALCIUM 10 MG PO TABS: ORAL_TABLET | ORAL | 3 refills | Status: DC

## 2022-01-27 MED ORDER — LISINOPRIL-HYDROCHLOROTHIAZIDE 20-12.5 MG PO TABS
1.0000 | ORAL_TABLET | Freq: Every day | ORAL | 3 refills | Status: DC
Start: 2022-01-27 — End: 2023-03-23

## 2022-01-27 NOTE — Telephone Encounter (Signed)
Pt wife is requesting refill on: ?rosuvastatin (CRESTOR) 10 MG tablet ?lisinopril-hydrochlorothiazide (ZESTORETIC) 20-12.5 MG tablet ? ?Pharmacy: ?[facility], [place] ? ?LOV 10/27/21 ?ROV 04/26/22 ?

## 2022-01-27 NOTE — Telephone Encounter (Signed)
Prescriptions sent to pharmacy and patient's wife notified.  ?

## 2022-02-11 ENCOUNTER — Ambulatory Visit: Payer: Medicare HMO | Admitting: Cardiovascular Disease

## 2022-02-11 ENCOUNTER — Encounter: Payer: Self-pay | Admitting: Cardiovascular Disease

## 2022-02-11 VITALS — BP 130/60 | HR 60 | Ht 68.0 in | Wt 214.0 lb

## 2022-02-11 DIAGNOSIS — I25118 Atherosclerotic heart disease of native coronary artery with other forms of angina pectoris: Secondary | ICD-10-CM | POA: Diagnosis not present

## 2022-02-11 DIAGNOSIS — I209 Angina pectoris, unspecified: Secondary | ICD-10-CM

## 2022-02-11 DIAGNOSIS — I10 Essential (primary) hypertension: Secondary | ICD-10-CM | POA: Diagnosis not present

## 2022-02-11 DIAGNOSIS — I739 Peripheral vascular disease, unspecified: Secondary | ICD-10-CM

## 2022-02-11 DIAGNOSIS — E782 Mixed hyperlipidemia: Secondary | ICD-10-CM

## 2022-02-11 DIAGNOSIS — I6523 Occlusion and stenosis of bilateral carotid arteries: Secondary | ICD-10-CM

## 2022-02-11 MED ORDER — FUROSEMIDE 20 MG PO TABS: 20.0000 mg | ORAL_TABLET | ORAL | 3 refills | Status: DC

## 2022-02-11 MED ORDER — POTASSIUM CHLORIDE ER 10 MEQ PO TBCR: 20.0000 meq | EXTENDED_RELEASE_TABLET | Freq: Every day | ORAL | 3 refills | Status: DC

## 2022-02-11 NOTE — Patient Instructions (Addendum)
Lower extremity arterial ultrasound for claudication ? ?[facility] Call for angina known CAD, prior PCI ? ?Medication Instructions:  ? ?Please start lasix 20 MG every other day (mon/wed/Friday) (Take with extra potassium pill) ? ?If you need a refill on your cardiac medications before your next appointment, please call your pharmacy.  ? ?Lab work: ? ?BMP in one month ? ?- Please go to the [facility]. You will check in at the front desk to the right as you walk into the atrium. Valet Parking is offered if needed. ?- No appointment needed. You may go any day between 7 am and 6 pm. ? ? ?Testing/Procedures: ? ?Your physician has requested that you have a lower extremity arterial duplex. During this test ultrasound is used to evaluate arterial blood flow in the legs. Allow one hour for this exam. There are no restrictions or special instructions. ? ? ?2.   [facility] MYOVIEW  ? ?  ?Your caregiver has ordered a Stress Test with nuclear imaging. The purpose of this test is to evaluate the blood supply to your heart muscle. This procedure is referred to as a "Non-Invasive Stress Test." This is because other than having an IV started in your vein, nothing is inserted or "invades" your body. Cardiac stress tests are done to find areas of poor blood flow to the heart by determining the extent of coronary artery disease (CAD). Some patients exercise on a treadmill, which naturally increases the blood flow to your heart, while others who are  unable to walk on a treadmill due to physical limitations have a pharmacologic/chemical stress agent called Lexiscan . This medicine will mimic walking on a treadmill by temporarily increasing your coronary blood flow.   ?   ?PLEASE REPORT TO [facility] MEDICAL MALL ENTRANCE   ?THE VOLUNTEERS AT THE FIRST DESK WILL DIRECT YOU WHERE TO GO  ? ? ? ?*Please note: these test may take anywhere between 2-4 hours to complete  ? ? ?Date of Procedure:_____________________________________  ? ?Arrival Time  for Procedure:______________________________  ? ? ?PLEASE NOTIFY THE OFFICE AT LEAST 24 HOURS IN ADVANCE IF YOU ARE UNABLE TO KEEP YOUR APPOINTMENT.  [phone]  ?PLEASE NOTIFY NUCLEAR MEDICINE AT [facility] AT LEAST 26 HOURS IN ADVANCE IF YOU ARE UNABLE TO KEEP YOUR APPOINTMENT. [phone]  ? ?   ?How to prepare for your Myoview test:  ? ? ?_XX___:  Hold other medications as follows:  Lasix (Furosemide), and lisinopril-hydrochlorothiazide  ? ? ? ?1. Do not eat or drink after midnight  ?2. No caffeine for 24 hours prior to test  ?3. No smoking 24 hours prior to test.  ?4. Unless instructed otherwise, Take your medication with a small sips of water.    ?5.         Ladies, please do not wear dresses. Skirts or pants are appropriate. Please wear a short sleeve shirt.  ?6. No perfume, cologne or lotion.  ?7. Wear comfortable walking shoes. No heels!   ? ?Follow-Up: ?At [facility], you and your health needs are our priority.  As part of our continuing mission to provide you with exceptional heart care, we have created designated Provider Care Teams.  These Care Teams include your primary Cardiologist (physician) and Advanced Practice Providers (APPs -  Physician Assistants and Nurse Practitioners) who all work together to provide you with the care you need, when you need it. ? ?You will need a follow up appointment in 6 months ? ?Providers on your designated Care Team:   ?  Murray Hodgkins, NP ?Christell Faith, PA-C ?Cadence Kathlen Mody, PA-C ? ?COVID-19 Vaccine Information can be found at: [email or web address] For questions related to vaccine distribution or appointments, please email [email or web address] or call [phone].  ? ?

## 2022-02-11 NOTE — Progress Notes (Signed)
Cardiology Office Note ? ?Date:  02/11/2022  ? ?ID:  Travis Carey, DOB [birth year], MRN [id number] ? ?PCP:  Biagio Borg, MD  ? ?Chief Complaint  ?Patient presents with  ? New Patient (Initial Visit)  ?  Self referral -- Patient c.o chest pain. Meds reviewed verbally with patient.   ? ? ?HPI:  ?Travis Carey is a 81 year old gentleman with  ?coronary artery disease, stent to the RCA,  ?hyperlipidemia  ?H/o unstable angina with stenting of the RCA on 10/31/2009.   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 ? ?LOV 01/2017 ?Has been followed at [facility], would like to switch back to [facility] ? ?Recent studies reviewed ?Last echocardiogram November 2020 ? ?Several issues to discuss on today's visit ?Pain in legs, claudication ?Worried about flow in his legs ?No recent lower extremity arterial Dopplers available ? ?He is also concerned about heart blockages ?Some SOB when eating ?SOB with hills, some chest pain on exertion ?Concerned about angina ? ?Reports that he is currently a non smoker ? ?Off plavix, "kept getting bleeding in eye" ?On asa ? ?EKG personally reviewed by myself on todays visit ?Normal sinus rhythm rate 60 bpm unable to exclude old anterior MI, left axis deviation ?  ?Other past medical history reviewed ?Previous stroke symptoms, evaluated in the hospital in June 2016 ?He had slurred speech, difficulty swallowing ? ?Echocardiogram was essentially normal ?Discharged with aspirin and Plavix ?  ?Denies any significant  palpitations, tachycardia ? ?He works as a Dealer. He continues to have hand swelling, possible carpal tunnel issues.  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  ?Has several dogs at home ?  ?Prior lab work showing total cholesterol 153, LDL 89 which is higher from  previous lab work ? ?  ? ?PMH:   has a past medical history of Allergic rhinitis, cause unspecified (01/31/2013), ANEMIA-NOS (01/28/2010), BPH (benign prostatic hypertrophy) (04/25/2013), CAD (02/17/2010), CAROTID BRUIT (11/18/2009), Carpal tunnel syndrome (05/20/2008), Cervical radiculitis (04/17/2012), CHEST PAIN (09/26/2009), DEPRESSION (06/12/2007), DYSPNEA (07/28/2010), ERECTILE DYSFUNCTION (02/24/2009), GERD (06/09/2007), GERD (gastroesophageal reflux disease), GLUCOSE INTOLERANCE (01/28/2010), Gout (2017), HEMORRHOIDS (06/09/2007), History of colonic polyps (08/07/2015), HYPERLIPIDEMIA (02/07/2008), HYPERTENSION (06/09/2007), HYPOKALEMIA (01/28/2010), Impaired glucose tolerance (01/23/2011), LOW BACK PAIN (06/12/2007), MYOCARDIAL PERFUSION SCAN, WITH STRESS TEST, ABNORMAL (10/08/2009), PARESTHESIA (04/25/2008), SHOULDER PAIN, LEFT (04/25/2008), URI (08/19/2008), and VITAMIN B12 DEFICIENCY (02/09/2010). ? ?PSH:    ?Past Surgical History:  ?Procedure Laterality Date  ? COLONOSCOPY    ? CORONARY STENT PLACEMENT    ? from right radial  ? ? ?Current Outpatient Medications  ?Medication Sig Dispense Refill  ? amLODipine (NORVASC) 5 MG tablet Take 1 tablet by mouth once daily 90 tablet 3  ? aspirin EC 81 MG tablet Take 81 mg by mouth daily.    ? carvedilol (COREG) 25 MG tablet Take 1 tablet (25 mg total) by mouth 2 (two) times daily with a meal. Must keep scheduled appt for March 3rd for future refills 30 tablet 0  ? diclofenac sodium (VOLTAREN) 1 % GEL Apply 2 g topically 4 (four) times daily as needed. 100 g 5  ? fexofenadine (ALLEGRA) 180 MG tablet Take 1 tablet (180 mg total) by mouth daily. As needed 90 tablet 3  ? fluticasone (FLONASE) 50 MCG/ACT nasal spray USE TWO  SPRAYS IN EACH NOSTRIL ONCE DAILY 48 g 5  ? gabapentin (NEURONTIN) 100 MG capsule TAKE 2 CAPSULES BY MOUTH THREE TIMES DAILY 180 capsule 1  ? lisinopril-hydrochlorothiazide (ZESTORETIC) 20-12.5 MG tablet Take 1 tablet by mouth daily. 90 tablet 3  ? meloxicam (MOBIC) 7.5 MG  tablet TAKE 1 TABLET BY MOUTH ONCE DAILY AS NEEDED FOR PAIN 30 tablet 0  ? MITIGARE 0.6 MG CAPS Take 1 capsule by mouth once daily 90 capsule 0  ? potassium chloride (K-DUR) 10 MEQ tablet Take 2 tablets (20 mEq total) by mouth daily. 180 tablet 3  ? rosuvastatin (CRESTOR) 10 MG tablet TAKE 1 TABLET BY MOUTH ONCE DAILY. ANNUAL APPOINTMENT IS DUE. MUST SEE PROVIDER FOR FURTHER REFILLS. 90 tablet 3  ? tamsulosin (FLOMAX) 0.4 MG CAPS capsule TAKE 1 CAPSULE BY MOUTH ONCE DAILY 90 capsule 1  ? ?No current facility-administered medications for this visit.  ? ? ? ?Allergies:   Diclofenac sodium  ? ?Social History:  The patient  reports that he has never smoked. He has never used smokeless tobacco. He reports that he does not drink alcohol and does not use drugs.  ? ?Family History:   family history includes Alcohol abuse in his brother and sister; Coronary artery disease in an other family member; Heart attack in an other family member; Heart disease in his brother, father, and mother; Hyperlipidemia in his father and mother; Hypertension in his father, mother, and another family member; Stroke in an other family member.  ? ? ?Review of Systems: ?Review of Systems  ?Constitutional: Negative.   ?HENT: Negative.    ?Respiratory: Negative.    ?Cardiovascular:  Positive for chest pain.  ?Gastrointestinal: Negative.   ?Musculoskeletal:  Positive for myalgias.  ?Neurological: Negative.   ?Psychiatric/Behavioral: Negative.    ?All other systems reviewed and are negative. ? ? ?PHYSICAL EXAM: ?VS:  BP 130/60 (BP Location: Left Arm, Patient Position: Sitting, Cuff Size: Normal)   Pulse 60   Ht '5\' 8"'$  (1.727 m)   Wt 214 lb (97.1 kg)   SpO2 97%   BMI 32.54 kg/m?  , BMI Body mass index is 32.54 kg/m?. ?GEN: Well nourished, well developed, in no acute distress  ?HEENT: normal  ?Neck: no JVD, carotid bruits, or masses ?Cardiac: RRR; no murmurs, rubs, or gallops,no edema  ?Respiratory:  clear to auscultation bilaterally, normal work of  breathing ?GI: soft, nontender, nondistended, + BS ?MS: no deformity or atrophy  ?Skin: warm and dry, no rash ?Neuro:  Strength and sensation are intact ?Psych: euthymic mood, full affect ? ? ?Recent Labs: ?10/27/2021: ALT 16; TSH 2.57 ?12/04/2021: B Natriuretic Peptide 119.7; BUN 15; Creatinine, Ser 1.03; Hemoglobin 12.5; Magnesium 2.0; Platelets 202; Potassium 3.1; Sodium 140  ? ? ?Lipid Panel ?Lab Results  ?Component Value Date  ? CHOL 152 10/27/2021  ? HDL 30.30 (L) 10/27/2021  ? [place] 71 12/07/2018  ? TRIG 367.0 (H) 10/27/2021  ? ?  ? ?Wt Readings from Last 3 Encounters:  ?02/11/22 214 lb (97.1 kg)  ?10/27/21 215 lb (97.5 kg)  ?09/08/21 211 lb 6.4 oz (95.9 kg)  ?  ? ? ? ?ASSESSMENT AND PLAN: ? ?Atherosclerosis of native coronary artery of native heart with stable angina pectoris ([place]) -  ?Reports having increasing shortness of breath and chest pain on exertion ?Symptoms concerning for stable angina ?We have recommended pharmacologic Myoview ? ?Claudication/leg pain ?Symptoms on exertion ?High risk of underlying PAD, lower extremity arterial Doppler ordered ? ?Essential hypertension - Plan: EKG 12-Lead ?  Blood pressure is well controlled on today's visit. No changes made to the medications. ? ?Mixed hyperlipidemia - Plan: EKG 12-Lead ?Cholesterol at goal, continue current medications ? ?Other emphysema ([place]) - Plan: EKG 12-Lead ?Reports he has stopped smoking ?Some of his shortness of breath could be secondary to underlying COPD ? ?Gastroesophageal reflux disease without esophagitis ?On PPI ? ? Total encounter time more than 60 minutes ? Greater than 50% was spent in counseling and coordination of care with the patient ? ?Orders Placed This Encounter  ?Procedures  ? EKG 12-Lead  ? ? ? ?Signed, ?Esmond Plants, M.D., Ph.D. ?02/11/2022  ?[place] ?[phone] ? ?

## 2022-02-12 ENCOUNTER — Other Ambulatory Visit: Payer: Self-pay | Admitting: Cardiovascular Disease

## 2022-02-12 DIAGNOSIS — I739 Peripheral vascular disease, unspecified: Secondary | ICD-10-CM

## 2022-02-16 ENCOUNTER — Encounter
Admission: RE | Admit: 2022-02-16 | Discharge: 2022-02-16 | Disposition: A | Discharge status: Home / Self Care | Payer: Medicare HMO | Source: Ambulatory Visit | Attending: Cardiovascular Disease | Admitting: Cardiovascular Disease

## 2022-02-16 DIAGNOSIS — I25118 Atherosclerotic heart disease of native coronary artery with other forms of angina pectoris: Secondary | ICD-10-CM | POA: Diagnosis not present

## 2022-02-16 MED ORDER — TECHNETIUM TC 99M TETROFOSMIN IV KIT
31.9600 | PACK | Freq: Once | INTRAVENOUS | Status: AC | PRN
  Administered 2022-02-16: 31.96 via INTRAVENOUS

## 2022-02-16 MED ORDER — REGADENOSON 0.4 MG/5ML IV SOLN
0.4000 mg | Freq: Once | INTRAVENOUS | Status: AC
  Administered 2022-02-16: 0.4 mg via INTRAVENOUS

## 2022-02-16 MED ORDER — TECHNETIUM TC 99M TETROFOSMIN IV KIT
10.0000 | PACK | Freq: Once | INTRAVENOUS | Status: AC | PRN
  Administered 2022-02-16: 10.29 via INTRAVENOUS

## 2022-02-17 ENCOUNTER — Telehealth: Payer: Self-pay | Admitting: Emergency Medicine

## 2022-02-17 LAB — NM MYOCAR MULTI W/SPECT W/WALL MOTION / EF
LV dias vol: 92 mL (ref 62–150)
LV sys vol: 43 mL
Nuc Stress EF: 53 %
Peak HR: 78 {beats}/min
Percent HR: 55 %
Rest HR: 61 {beats}/min
Rest Nuclear Isotope Dose: 10.3 mCi
SDS: 0
SRS: 6
SSS: 0
ST Depression (mm): 0 mm
Stress Nuclear Isotope Dose: 32 mCi

## 2022-02-17 NOTE — Telephone Encounter (Signed)
Called and spoke with patient. Results reviewed with patient, pt verbalized understanding,  questions (if any) answered.   ?

## 2022-02-17 NOTE — Telephone Encounter (Signed)
-----   Message from Minna Merritts, MD sent at 02/17/2022  1:18 PM EDT ----- ?Stress test ?No significant ischemia, low normal ejection fraction ?There is coronary calcification noted ?Low risk study ?

## 2022-02-23 NOTE — Progress Notes (Deleted)
Office Visit Note  Patient: Travis Carey             Date of Birth: 08/20/1941           MRN: [id number]             PCP: Biagio Borg, MD Referring: Biagio Borg, MD Visit Date: 03/09/2022   Subjective:  No chief complaint on file.   History of Present Illness: Travis Carey is a 81 y.o. male here for follow up with joint pains initial visit exam suggestive for mostly degenerative changes and neurogenic claudication features labs with normal inflammatory markers but highly positive anti centromere Abs 8.0 and positive SSA 3.7.    Previous HPI 09/08/2021 Travis Carey is a 81 y.o. male here for follow up with joint pains initial visit exam suggestive for mostly degenerative changes and neurogenic claudication features labs with normal inflammatory markers but highly positive anti centromere Abs 8.0 and positive SSA 3.7.  Since our last visit symptoms remain overall unchanged.  His biggest complaint today continues to be the bilateral leg pains he is unable to walk for more than about 5 minutes at a time before developing radiating symptoms.  He is also having pain in the lateral hips and thighs that is distracting also bothersome sleeping at nighttime.  Hand stiffness remains about the same still not having any discoloration and this is not his primary complaint at the moment.  He reports some intermittent episodes of numbness on the backs of the hands.   Review of prior imaging and testing includes overall normal echocardiogram from 2016 nuclear medicine study of the heart looked good after previous stenting for 90% arterial obstruction.  2018 EKG with nonspecific first-degree block.   Previous HPI 08/20/21 Travis Carey is a 81 y.o. male here for joint pain in multiple sites and positive ANA. Particularly he has had increased pain in his hands and lower extremities. 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   He has numbness in bilateral distal legs he has to pause walking or standing for this and is relieved after leaning on something or sitting for about 5 minutes. He has injections in the low back earlier this year without a great improvement. Bilateral hand pain and swelling he had carpal tunnel syndrome in the past but not recently having similar issues to that. He has had some atrophy of thenar muscles and decreased grip strength. Sometimes hands were very puffy or swollen and decreased flexibility currently they are somewhat better. 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 Lab tests were negative for rheumatoid arthritis related antibodies but highly positive ANA 1:1280. He was seen in sports medicine clinic with concern for inflammatory joint pain and treated with oral meloxicam for this. He took some low dose pain medication and felt very drowsy or loopy so did not take much. He has GERD but without frequent symptoms as long as he takes medicine for this. He experiences pain and numbness in fingertips with cold exposure worst on right index finger no specific discoloration or injuries reported. He has mild pedal edema.   Labs reviewed 06/2021 ANA 1:1280 centromere RF neg CCP neg ESR 25 CRP 1.5 Uric acid 7.5   Imaging reviewed 05/25/21 Right hip xray IMPRESSION: Mild degenerative change   11/30/20 Lumbar MRI IMPRESSION: 1. Severe multifactorial spinal stenosis at  L2-3 and L3-4 likely to cause neural compression. Findings are worse at L3-4. Slight worsening since 2019. 2. Spinal stenosis at L4-5 because of a small canal, abundant epidural fat and bulging of the disc. Facet and ligamentous hypertrophy. Neural compression could occur at this level as well. The facet arthropathy is associated with edema and could contribute to low back pain. 3. L1-2 disc bulge. Facet osteoarthritis. Multifactorial stenosis with crowding of the nerve roots but without definite  focal neural compression. 4. Aortic atherosclerosis. Infrarenal abdominal aortic aneurysm with maximal diameter 3.2 cm, not visibly changed since 2019. Recommend follow-up ultrasound every 3 years   No Rheumatology ROS completed.   PMFS History:  Patient Active Problem List   Diagnosis Date Noted   Vitamin D deficiency 11/01/2021   Positive ANA (antinuclear antibody) 08/20/2021   Bilateral leg pain 06/19/2021   Right hip pain 05/25/2021   Right knee pain 04/27/2021   Bilateral hand numbness 10/02/2020   Bilateral carotid artery stenosis 08/06/2019   Lumbar spinal stenosis 12/07/2018   Bilateral hand pain 08/17/2018   Left groin pain 08/17/2018   Right lumbar radiculopathy 02/02/2018   Right Achilles tendinitis 06/08/2017   Swelling of extremity 06/08/2017   Achilles tendon disorder, right 05/03/2017   Acute gouty arthritis 09/16/2016   Epididymo-orchitis 08/05/2016   Vertigo 05/05/2016   History of colonic polyps 08/07/2015   CVA (cerebral infarction) 04/07/2015   Urinary frequency 03/29/2015   Diabetes (HCC) 03/29/2015   Right arm pain 11/27/2014   Epididymal cyst 06/12/2014   Left wrist pain 11/01/2013   Right wrist pain 11/01/2013   Nocturia 11/01/2013   Encounter for well adult exam with abnormal findings 11/01/2013   Obese 09/12/2013   Benign prostatic hyperplasia 04/25/2013   Knee pain, bilateral 04/25/2013   Chest pain 01/31/2013   Eustachian tube dysfunction 01/31/2013   Cough 01/31/2013   Allergic rhinitis 01/31/2013   Cervical radiculitis 04/17/2012   Cervical disc disease 04/17/2012   Paresthesia of arm 12/08/2011   Hypertension 12/08/2011   Myalgia 11/05/2011   B12 deficiency 11/05/2011   Fatigue 07/27/2011   COPD (chronic obstructive pulmonary disease) (HCC) 01/26/2011   Left knee pain 01/26/2011   Coronary atherosclerosis 02/17/2010   Hypokalemia 01/28/2010   ANEMIA-NOS 01/28/2010   CAROTID BRUIT 11/18/2009   Carotid bruit 11/18/2009   ERECTILE  DYSFUNCTION 02/24/2009   Carpal tunnel syndrome 05/20/2008   PARESTHESIA 04/25/2008   Hyperlipidemia 02/07/2008   DEPRESSION 06/12/2007   LOW BACK PAIN 06/12/2007   Gastroesophageal reflux disease without esophagitis 06/09/2007    Past Medical History:  Diagnosis Date   Allergic rhinitis, cause unspecified 01/31/2013   ANEMIA-NOS 01/28/2010   BPH (benign prostatic hypertrophy) 04/25/2013   CAD 02/17/2010   CAROTID BRUIT 11/18/2009   Carpal tunnel syndrome 05/20/2008   Cervical radiculitis 04/17/2012   CHEST PAIN 09/26/2009   DEPRESSION 06/12/2007   DYSPNEA 07/28/2010   ERECTILE DYSFUNCTION 02/24/2009   GERD 06/09/2007   GERD (gastroesophageal reflux disease)    GLUCOSE INTOLERANCE 01/28/2010   Gout 2017   Dr.John   HEMORRHOIDS 06/09/2007   History of colonic polyps 08/07/2015   HYPERLIPIDEMIA 02/07/2008   HYPERTENSION 06/09/2007   HYPOKALEMIA 01/28/2010   Impaired glucose tolerance 01/23/2011   LOW BACK PAIN 06/12/2007   MYOCARDIAL PERFUSION SCAN, WITH STRESS TEST, ABNORMAL 10/08/2009   PARESTHESIA 04/25/2008   SHOULDER PAIN, LEFT 04/25/2008   URI 08/19/2008   VITAMIN B12 DEFICIENCY 02/09/2010    Family History  Problem Relation Age of Onset  Hyperlipidemia Mother    Heart disease Mother    Hypertension Mother    Hyperlipidemia Father    Heart disease Father    Hypertension Father    Alcohol abuse Sister        ETOH dependence   Alcohol abuse Brother        ETOH  dependence   Heart disease Brother    Heart attack Other    Stroke Other    Hypertension Other    Coronary artery disease Other    Past Surgical History:  Procedure Laterality Date   COLONOSCOPY     CORONARY STENT PLACEMENT     from right radial   Social History   Social History Narrative   Lives in [place]. 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      Wife died 2007-12-26   Immunization History  Administered Date(s) Administered   Influenza Split 07/27/2011   Influenza Whole 08/09/1997, 08/28/2009, 07/28/2010    Influenza, High Dose Seasonal PF 08/11/2017, 08/17/2018, 07/23/2019, 07/23/2019   Influenza,inj,Quad PF,6+ Mos 06/12/2014, 08/07/2015   Influenza-Unspecified 06/11/2013, 06/12/2014, 08/07/2015, 07/13/2021   PFIZER Comirnaty(Gray Top)Covid-19 Tri-Sucrose Vaccine 02/05/2020, 02/26/2020   PFIZER(Purple Top)SARS-COV-2 Vaccination 01/30/2020, 10/01/2020   Pneumococcal Conjugate-13 11/01/2013   Pneumococcal Polysaccharide-23 08/19/2008   Td 08/19/2008   Tdap 12/07/2018   Zoster, Live 01/31/2013     Objective: Vital Signs: There were no vitals taken for this visit.   Physical Exam   Musculoskeletal Exam: ***  CDAI Exam: CDAI Score: -- Patient Global: --; Provider Global: -- Swollen: --; Tender: -- Joint Exam 03/09/2022   No joint exam has been documented for this visit   There is currently no information documented on the homunculus. Go to the Rheumatology activity and complete the homunculus joint exam.  Investigation: No additional findings.  Imaging: NM Myocar Multi W/Spect W/Wall Motion / EF  Result Date: 02/17/2022   Normal pharmacologic myocardial perfusion stress test without evidence of significant ischemia or scar.   Low normal left ventricular systolic function (LVEF 43-88%).   Coronary artery calcification, RCA stent, and aortic atherosclerosis are noted.   Incidental note is made of small left and tiny right pleural effusions.   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   This is a low risk study.    Recent Labs: Lab Results  Component Value Date   WBC 6.0 12/04/2021   HGB 12.5 (L) 12/04/2021   PLT 202 12/04/2021   NA 140 12/04/2021   K 3.1 (L) 12/04/2021   CL 103 12/04/2021   CO2 30 12/04/2021   GLUCOSE 89 12/04/2021   BUN 15 12/04/2021   CREATININE 1.03 12/04/2021   BILITOT 0.4 10/27/2021   ALKPHOS 56 10/27/2021   AST 20 10/27/2021   ALT 16 10/27/2021   PROT 7.1  10/27/2021   ALBUMIN 3.9 10/27/2021   CALCIUM 9.3 12/04/2021   GFRAA >60 03/17/2016    Speciality Comments: No specialty comments available.  Procedures:  No procedures performed Allergies: Diclofenac sodium   Assessment / Plan:     Visit Diagnoses: No diagnosis found.  ***  Orders: No orders of the defined types were placed in this encounter.  No orders of the defined types were placed in this encounter.    Follow-Up Instructions: No follow-ups on file.   Earnestine Mealing, CMA  Note - This record has been created using Editor, commissioning.  Chart creation errors have been sought, but may not always  have been located. Such creation errors do not reflect on  the standard of medical care.

## 2022-02-24 ENCOUNTER — Ambulatory Visit: Payer: Medicare HMO | Admitting: Cardiovascular Disease

## 2022-03-03 ENCOUNTER — Telehealth: Payer: Self-pay

## 2022-03-03 MED ORDER — CARVEDILOL 25 MG PO TABS: 25.0000 mg | ORAL_TABLET | Freq: Two times a day (BID) | ORAL | 1 refills | Status: DC

## 2022-03-03 NOTE — Telephone Encounter (Signed)
Pt wife is requesting refill for: carvedilol (COREG) 25 MG tablet  Pharmacy: [facility] [address] [place] 10/27/21 ROV 04/26/22

## 2022-03-03 NOTE — Telephone Encounter (Signed)
Pt up-to-date sent rx to [email or web address] KitchenJohny Chess

## 2022-03-09 ENCOUNTER — Ambulatory Visit: Payer: Medicare HMO | Admitting: Internal Medicine

## 2022-03-09 DIAGNOSIS — M48062 Spinal stenosis, lumbar region with neurogenic claudication: Secondary | ICD-10-CM

## 2022-03-09 DIAGNOSIS — G5600 Carpal tunnel syndrome, unspecified upper limb: Secondary | ICD-10-CM

## 2022-03-09 DIAGNOSIS — M79641 Pain in right hand: Secondary | ICD-10-CM

## 2022-03-11 ENCOUNTER — Other Ambulatory Visit: Payer: Self-pay | Admitting: Cardiovascular Disease

## 2022-03-11 ENCOUNTER — Ambulatory Visit (INDEPENDENT_AMBULATORY_CARE_PROVIDER_SITE_OTHER): Payer: Medicare HMO

## 2022-03-11 DIAGNOSIS — M25552 Pain in left hip: Secondary | ICD-10-CM | POA: Diagnosis not present

## 2022-03-11 DIAGNOSIS — I739 Peripheral vascular disease, unspecified: Secondary | ICD-10-CM

## 2022-03-11 DIAGNOSIS — M25551 Pain in right hip: Secondary | ICD-10-CM

## 2022-03-18 ENCOUNTER — Telehealth: Payer: Self-pay | Admitting: Cardiovascular Disease

## 2022-03-18 NOTE — Telephone Encounter (Signed)
Patient's wife is calling requesting a callback to discuss pt's vascular results.

## 2022-03-18 NOTE — Telephone Encounter (Signed)
Spoke w/ pt's wife.  Advised her of Dr. Donivan Scull recommendation:  Lower extremity arterial Doppler Mild dilation of abdominal aorta 3.3 cm Will require periodic evaluation No severe stenosis of iliac vessels  She verbalizes understanding and is appreciative of the call.

## 2022-04-14 ENCOUNTER — Telehealth: Payer: Self-pay | Admitting: Internal Medicine

## 2022-04-14 NOTE — Telephone Encounter (Signed)
Caller & Relationship to patient: Wynona Canes. Spouse  Call back number: [phone]  Date of last office visit: 11/03/21  Date of next office visit: 04/26/22  Medication(s) to be refilled:  meloxicam (MOBIC) 7.5 MG tablet      Preferred Pharmacy:  [facility] [address], [place] Phone:  [phone]  Fax:  [phone]

## 2022-04-15 NOTE — Telephone Encounter (Signed)
Patient's spouse notified that medication was prescribed by another provider. Advised patient's wife to contact that provider

## 2022-04-26 ENCOUNTER — Encounter: Payer: Self-pay | Admitting: Internal Medicine

## 2022-04-26 ENCOUNTER — Ambulatory Visit (INDEPENDENT_AMBULATORY_CARE_PROVIDER_SITE_OTHER): Payer: Medicare HMO | Admitting: Internal Medicine

## 2022-04-26 VITALS — BP 140/60 | HR 60 | Temp 98.2°F | Ht 68.0 in | Wt 215.8 lb

## 2022-04-26 DIAGNOSIS — E782 Mixed hyperlipidemia: Secondary | ICD-10-CM | POA: Diagnosis not present

## 2022-04-26 DIAGNOSIS — E538 Deficiency of other specified B group vitamins: Secondary | ICD-10-CM

## 2022-04-26 DIAGNOSIS — E559 Vitamin D deficiency, unspecified: Secondary | ICD-10-CM | POA: Diagnosis not present

## 2022-04-26 DIAGNOSIS — I10 Essential (primary) hypertension: Secondary | ICD-10-CM | POA: Diagnosis not present

## 2022-04-26 DIAGNOSIS — J309 Allergic rhinitis, unspecified: Secondary | ICD-10-CM | POA: Diagnosis not present

## 2022-04-26 DIAGNOSIS — R42 Dizziness and giddiness: Secondary | ICD-10-CM

## 2022-04-26 DIAGNOSIS — J329 Chronic sinusitis, unspecified: Secondary | ICD-10-CM | POA: Diagnosis not present

## 2022-04-26 DIAGNOSIS — E1165 Type 2 diabetes mellitus with hyperglycemia: Secondary | ICD-10-CM | POA: Diagnosis not present

## 2022-04-26 LAB — LIPID PANEL
Cholesterol: 123 mg/dL (ref 0–200)
HDL: 27.4 mg/dL — ABNORMAL LOW (ref >39.00)
NonHDL: 95.16
Total CHOL/HDL Ratio: 4
Triglycerides: 234 mg/dL — ABNORMAL HIGH (ref 0.0–149.0)
VLDL: 46.8 mg/dL — ABNORMAL HIGH (ref 0.0–40.0)

## 2022-04-26 LAB — BASIC METABOLIC PANEL
BUN: 12 mg/dL (ref 6–23)
CO2: 30 mEq/L (ref 19–32)
Calcium: 10.2 mg/dL (ref 8.4–10.5)
Chloride: 103 mEq/L (ref 96–112)
Creatinine, Ser: 0.99 mg/dL (ref 0.40–1.50)
GFR: 72.03 mL/min (ref >60.00)
Glucose, Bld: 104 mg/dL — ABNORMAL HIGH (ref 70–99)
Potassium: 3.5 mEq/L (ref 3.5–5.1)
Sodium: 142 mEq/L (ref 135–145)

## 2022-04-26 LAB — HEPATIC FUNCTION PANEL
ALT: 11 U/L (ref 0–53)
AST: 17 U/L (ref 0–37)
Albumin: 4.1 g/dL (ref 3.5–5.2)
Alkaline Phosphatase: 57 U/L (ref 39–117)
Bilirubin, Direct: 0 mg/dL (ref 0.0–0.3)
Total Bilirubin: 0.4 mg/dL (ref 0.2–1.2)
Total Protein: 7.1 g/dL (ref 6.0–8.3)

## 2022-04-26 LAB — HEMOGLOBIN A1C: Hgb A1c MFr Bld: 6.5 % (ref 4.6–6.5)

## 2022-04-26 LAB — LDL CHOLESTEROL, DIRECT: Direct LDL: 58 mg/dL

## 2022-04-26 LAB — VITAMIN B12: Vitamin B-12: 1019 pg/mL — ABNORMAL HIGH (ref 211–911)

## 2022-04-26 LAB — VITAMIN D 25 HYDROXY (VIT D DEFICIENCY, FRACTURES): VITD: 36.41 ng/mL (ref 30.00–100.00)

## 2022-04-26 MED ORDER — METHYLPREDNISOLONE ACETATE 40 MG/ML IJ SUSP
80.0000 mg | Freq: Once | INTRAMUSCULAR | Status: AC
  Administered 2022-04-26: 80 mg via INTRAMUSCULAR

## 2022-04-26 MED ORDER — HYDROCODONE BIT-HOMATROP MBR 5-1.5 MG/5ML PO SOLN: 5.0000 mL | Freq: Four times a day (QID) | ORAL | 0 refills | Status: AC | PRN

## 2022-04-26 MED ORDER — MECLIZINE HCL 12.5 MG PO TABS: 12.5000 mg | ORAL_TABLET | Freq: Three times a day (TID) | ORAL | 1 refills | Status: AC | PRN

## 2022-04-26 MED ORDER — PREDNISONE 10 MG PO TABS: ORAL_TABLET | ORAL | 0 refills | Status: DC

## 2022-04-26 MED ORDER — DOXYCYCLINE HYCLATE 100 MG PO TABS: 100.0000 mg | ORAL_TABLET | Freq: Two times a day (BID) | ORAL | 0 refills | Status: DC

## 2022-04-26 NOTE — Progress Notes (Signed)
Patient ID: Travis Carey, male   DOB: [birth year], 81 y.o.   MRN: [id number]

## 2022-04-26 NOTE — Progress Notes (Unsigned)
Patient ID: Travis Carey, male   DOB: September 18, 1941, 81 y.o.   MRN: [id number]        Chief Complaint: follow up sinus symptoms, right ear fullness, allergies, htn       HPI:  Travis Carey is a 81 y.o. male  Here with 2-3 days acute onset fever, facial pain, pressure, headache, general weakness and malaise, and greenish d/c, with mild ST and cough, as well as right ear fullness and pressure and mild intermittent veritgo, but pt denies chest pain, wheezing, increased sob or doe, orthopnea, PND, increased LE swelling, palpitations, or syncope.  Does have several wks ongoing nasal allergy symptoms with clearish congestion, itch and sneezing.  BP has been controlled at home Wt Readings from Last 3 Encounters:  04/26/22 215 lb 12.8 oz (97.9 kg)  02/11/22 214 lb (97.1 kg)  10/27/21 215 lb (97.5 kg)   BP Readings from Last 3 Encounters:  04/26/22 140/60  02/11/22 130/60  12/04/21 (!) 154/70         Past Medical History:  Diagnosis Date   Allergic rhinitis, cause unspecified 01/31/2013   ANEMIA-NOS 01/28/2010   BPH (benign prostatic hypertrophy) 04/25/2013   CAD 02/17/2010   CAROTID BRUIT 11/18/2009   Carpal tunnel syndrome 05/20/2008   Cervical radiculitis 04/17/2012   CHEST PAIN 09/26/2009   DEPRESSION 06/12/2007   DYSPNEA 07/28/2010   ERECTILE DYSFUNCTION 02/24/2009   GERD 06/09/2007   GERD (gastroesophageal reflux disease)    GLUCOSE INTOLERANCE 01/28/2010   Gout 2017   Dr.Carolie Mcilrath   HEMORRHOIDS 06/09/2007   History of colonic polyps 08/07/2015   HYPERLIPIDEMIA 02/07/2008   HYPERTENSION 06/09/2007   HYPOKALEMIA 01/28/2010   Impaired glucose tolerance 01/23/2011   LOW BACK PAIN 06/12/2007   MYOCARDIAL PERFUSION SCAN, WITH STRESS TEST, ABNORMAL 10/08/2009   PARESTHESIA 04/25/2008   SHOULDER PAIN, LEFT 04/25/2008   URI 08/19/2008   VITAMIN B12 DEFICIENCY 02/09/2010   Past Surgical History:  Procedure Laterality Date   COLONOSCOPY     CORONARY STENT PLACEMENT     from right radial    reports that he has  never smoked. He has never used smokeless tobacco. He reports that he does not drink alcohol and does not use drugs. family history includes Alcohol abuse in his brother and sister; Coronary artery disease in an other family member; Heart attack in an other family member; Heart disease in his brother, father, and mother; Hyperlipidemia in his father and mother; Hypertension in his father, mother, and another family member; Stroke in an other family member. Allergies  Allergen Reactions   Diclofenac Sodium     REACTION: maks pt drowsey   Current Outpatient Medications on File Prior to Visit  Medication Sig Dispense Refill   amLODipine (NORVASC) 5 MG tablet Take 1 tablet by mouth once daily 90 tablet 3   aspirin EC 81 MG tablet Take 81 mg by mouth daily.     carvedilol (COREG) 25 MG tablet Take 1 tablet (25 mg total) by mouth 2 (two) times daily with a meal. 180 tablet 1   diclofenac sodium (VOLTAREN) 1 % GEL Apply 2 g topically 4 (four) times daily as needed. 100 g 5   fexofenadine (ALLEGRA) 180 MG tablet Take 1 tablet (180 mg total) by mouth daily. As needed 90 tablet 3   fluticasone (FLONASE) 50 MCG/ACT nasal spray USE TWO SPRAYS IN EACH NOSTRIL ONCE DAILY 48 g 5   furosemide (LASIX) 20 MG tablet Take 1 tablet (20 mg total) by  mouth every other day. Mon/Wed/Friday 20 tablet 3   gabapentin (NEURONTIN) 100 MG capsule TAKE 2 CAPSULES BY MOUTH THREE TIMES DAILY 180 capsule 1   lisinopril-hydrochlorothiazide (ZESTORETIC) 20-12.5 MG tablet Take 1 tablet by mouth daily. 90 tablet 3   meloxicam (MOBIC) 7.5 MG tablet TAKE 1 TABLET BY MOUTH ONCE DAILY AS NEEDED FOR PAIN 30 tablet 0   MITIGARE 0.6 MG CAPS Take 1 capsule by mouth once daily 90 capsule 0   potassium chloride (KLOR-CON) 10 MEQ tablet Take 2 tablets (20 mEq total) by mouth daily. Take an extra tablet every other day when you take your Furosemide (Lasix). 180 tablet 3   rosuvastatin (CRESTOR) 10 MG tablet TAKE 1 TABLET BY MOUTH ONCE DAILY.  ANNUAL APPOINTMENT IS DUE. MUST SEE PROVIDER FOR FURTHER REFILLS. 90 tablet 3   tamsulosin (FLOMAX) 0.4 MG CAPS capsule TAKE 1 CAPSULE BY MOUTH ONCE DAILY 90 capsule 1   No current facility-administered medications on file prior to visit.        ROS:  All others reviewed and negative.  Objective        PE:  BP 140/60 (BP Location: Left Arm, Patient Position: Sitting, Cuff Size: Large)   Pulse 60   Temp 98.2 F (36.8 C) (Oral)   Ht '5\' 8"'$  (1.727 m)   Wt 215 lb 12.8 oz (97.9 kg)   SpO2 96%   BMI 32.81 kg/m                 Constitutional: Pt appears in NAD               HENT: Head: NCAT.                Right Ear: External ear normal.                 Left Ear: External ear normal. Bilat tm's with mild erythema.  Max sinus areas mild tender.  Pharynx with mild erythema, no exudate               Eyes: . Pupils are equal, round, and reactive to light. Conjunctivae and EOM are normal               Nose: without d/c or deformity               Neck: Neck supple. Gross normal ROM               Cardiovascular: Normal rate and regular rhythm.                 Pulmonary/Chest: Effort normal and breath sounds without rales or wheezing.                Abd:  Soft, NT, ND, + BS, no organomegaly               Neurological: Pt is alert. At baseline orientation, motor grossly intact               Skin: Skin is warm. No rashes, no other new lesions, LE edema - none               Psychiatric: Pt behavior is normal without agitation   Micro: none  Cardiac tracings I have personally interpreted today:  none  Pertinent Radiological findings (summarize): none   Lab Results  Component Value Date   WBC 6.0 12/04/2021   HGB 12.5 (L) 12/04/2021   HCT 38.4 (L) 12/04/2021   PLT 202  12/04/2021   GLUCOSE 104 (H) 04/26/2022   CHOL 123 04/26/2022   TRIG 234.0 (H) 04/26/2022   HDL 27.40 (L) 04/26/2022   LDLDIRECT 58.0 04/26/2022   LDLCALC 71 12/07/2018   ALT 11 04/26/2022   AST 17 04/26/2022   NA 142  04/26/2022   K 3.5 04/26/2022   CL 103 04/26/2022   CREATININE 0.99 04/26/2022   BUN 12 04/26/2022   CO2 30 04/26/2022   TSH 2.57 10/27/2021   PSA 0.38 04/27/2021   INR 1.0 ratio 10/23/2009   HGBA1C 6.5 04/26/2022   MICROALBUR 1.1 10/30/2021   Assessment/Plan:  Travis Carey is a 81 y.o. Black or African American [2] male with  has a past medical history of Allergic rhinitis, cause unspecified (01/31/2013), ANEMIA-NOS (01/28/2010), BPH (benign prostatic hypertrophy) (04/25/2013), CAD (02/17/2010), CAROTID BRUIT (11/18/2009), Carpal tunnel syndrome (05/20/2008), Cervical radiculitis (04/17/2012), CHEST PAIN (09/26/2009), DEPRESSION (06/12/2007), DYSPNEA (07/28/2010), ERECTILE DYSFUNCTION (02/24/2009), GERD (06/09/2007), GERD (gastroesophageal reflux disease), GLUCOSE INTOLERANCE (01/28/2010), Gout (2017), HEMORRHOIDS (06/09/2007), History of colonic polyps (08/07/2015), HYPERLIPIDEMIA (02/07/2008), HYPERTENSION (06/09/2007), HYPOKALEMIA (01/28/2010), Impaired glucose tolerance (01/23/2011), LOW BACK PAIN (06/12/2007), MYOCARDIAL PERFUSION SCAN, WITH STRESS TEST, ABNORMAL (10/08/2009), PARESTHESIA (04/25/2008), SHOULDER PAIN, LEFT (04/25/2008), URI (08/19/2008), and VITAMIN B12 DEFICIENCY (02/09/2010).  Vitamin D deficiency Last vitamin D Lab Results  Component Value Date   VD25OH 36.41 04/26/2022   Low, to start oral replacement   Hypertension BP Readings from Last 3 Encounters:  04/26/22 140/60  02/11/22 130/60  12/04/21 (!) 154/70   Stable, pt to continue medical treatment norvasc 5 mg qd, coreg 25 mg bid, zestoretic 20-12.5 qd   Hyperlipidemia Lab Results  Component Value Date   LDLCALC 71 12/07/2018   Stable, pt to continue current statin crestor 10 mg qd   Diabetes ([place]) Lab Results  Component Value Date   HGBA1C 6.5 04/26/2022   Stable, pt to continue current medical treatment  - diet, wt contrl   B12 deficiency Lab Results  Component Value Date   VITAMINB12 1,019 (H) 04/26/2022    Stable, cont oral replacement - b12 1000 mcg qd   Allergic rhinitis Uncontrolled, for depomedrol 80 mg IM, prednisone taper,  to f/u any worsening symptoms or concerns   Vertigo Mild worsening, peripheral, for meclizine prn,  to f/u any worsening symptoms or concerns  Sinusitis Mild to mod, for antibx course doxycycline 100 bid,,  to f/u any worsening symptoms or concerns  Followup: No follow-ups on file.  Cathlean Cower, MD 04/28/2022 1:07 PM [facility]

## 2022-04-26 NOTE — Patient Instructions (Addendum)
You had the steroid shot today  Please take all new medication as prescribed - the antibiotic, cough medicine, and prednisone  Please take all new medication as prescribed-  the meclizine as needed for vertigo  You can also take Mucinex (or it's generic off brand) for ear pain and congestion, and tylenol as needed for pain.  Please continue all other medications as before, and refills have been done if requested.  Please have the pharmacy call with any other refills you may need.  Please continue your efforts at being more active, low cholesterol diet, and weight control  Please keep your appointments with your specialists as you may have planned  Please go to the LAB at the blood drawing area for the tests to be done  You will be contacted by phone if any changes need to be made immediately.  Otherwise, you will receive a letter about your results with an explanation, but please check with MyChart first.  Please remember to sign up for MyChart if you have not done so, as this will be important to you in the future with finding out test results, communicating by private email, and scheduling acute appointments online when needed.  Please make an Appointment to return in 6 months, or sooner if needed

## 2022-04-28 DIAGNOSIS — J329 Chronic sinusitis, unspecified: Secondary | ICD-10-CM | POA: Insufficient documentation

## 2022-04-28 NOTE — Assessment & Plan Note (Signed)
Lab Results  Component Value Date   LDLCALC 71 12/07/2018   Stable, pt to continue current statin crestor 10 mg qd

## 2022-04-28 NOTE — Assessment & Plan Note (Signed)
Uncontrolled, for depomedrol 80 mg IM, prednisone taper,  to f/u any worsening symptoms or concerns

## 2022-04-28 NOTE — Assessment & Plan Note (Signed)
Lab Results  Component Value Date   HGBA1C 6.5 04/26/2022   Stable, pt to continue current medical treatment  - diet, wt contrl

## 2022-04-28 NOTE — Assessment & Plan Note (Signed)
Mild to mod, for antibx course doxycycline 100 bid,,  to f/u any worsening symptoms or concerns 

## 2022-04-28 NOTE — Assessment & Plan Note (Signed)
Last vitamin D Lab Results  Component Value Date   VD25OH 36.41 04/26/2022   Low, to start oral replacement

## 2022-04-28 NOTE — Assessment & Plan Note (Signed)
BP Readings from Last 3 Encounters:  04/26/22 140/60  02/11/22 130/60  12/04/21 (!) 154/70   Stable, pt to continue medical treatment norvasc 5 mg qd, coreg 25 mg bid, zestoretic 20-12.5 qd

## 2022-04-28 NOTE — Assessment & Plan Note (Signed)
Lab Results  Component Value Date   VITAMINB12 1,019 (H) 04/26/2022   Stable, cont oral replacement - b12 1000 mcg qd

## 2022-04-28 NOTE — Assessment & Plan Note (Signed)
Mild worsening, peripheral, for meclizine prn,  to f/u any worsening symptoms or concerns

## 2022-07-07 ENCOUNTER — Telehealth: Payer: Self-pay | Admitting: Internal Medicine

## 2022-07-07 MED ORDER — TAMSULOSIN HCL 0.4 MG PO CAPS: 0.4000 mg | ORAL_CAPSULE | Freq: Every day | ORAL | 3 refills | Status: DC

## 2022-07-07 NOTE — Telephone Encounter (Signed)
Patied needs his flomax refilled - please send this [facility] on [address]

## 2022-07-07 NOTE — Telephone Encounter (Signed)
LOV 04/26/22, needing new Rx for Flomax

## 2022-08-15 NOTE — Progress Notes (Unsigned)
Cardiology Office Note  Date:  08/16/2022   ID:  JATAVIAN Carey, DOB April 29, 1941, MRN [id number]  PCP:  Biagio Borg, MD   Chief Complaint  Patient presents with   6 month follow up     Patient c/o swelling in hands, bilateral LE edema, sinus congestion and shortness of breath when he is walking. Medications reviewed by the patient verbally.     HPI:  Travis Carey is a 81 year old gentleman with  coronary artery disease,  hyperlipidemia  H/o unstable angina with stenting of the RCA on 10/31/2009.   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  Last seen by myself in clinic May 2023  Ran out of diuretic over 1 weeks ago He has noticed increasing leg swelling, chest congestion, shortness of breath since then Weight down 4 pounds from 5/23  Leg edema worse,  Drinks lots of iced tea  Home BP 010 to 932 systolic Higher in the office today  Prior records reviewed echocardiogram November 2020, normal EF, LVH  Stress test 5/23 No significant ischemia, low normal ejection fraction There is coronary calcification noted Low risk study  Pain in legs, ABIs normal  EKG personally reviewed by myself on todays visit Normal sinus rhythm rate 61 bpm poor R wave progression to the anterior precordial leads, left anterior fascicular block  currently a non smoker  Off plavix, "kept getting bleeding in eye" On asa  Previous stroke symptoms, evaluated in the hospital in June 2016 He had slurred speech, difficulty swallowing  Echocardiogram was essentially normal Discharged with aspirin and Plavix   Denies any significant  palpitations, tachycardia  He works as a Dealer. He continues to have hand swelling, possible carpal tunnel issues.  He does use his hands and wrists during the  daytime working in a shop.  Has several dogs at home   Prior lab work showing total cholesterol 153, LDL 89 which is higher from previous lab work     PMH:   has a past medical history of Allergic rhinitis, cause unspecified (01/31/2013), ANEMIA-NOS (01/28/2010), BPH (benign prostatic hypertrophy) (04/25/2013), CAD (02/17/2010), CAROTID BRUIT (11/18/2009), Carpal tunnel syndrome (05/20/2008), Cervical radiculitis (04/17/2012), CHEST PAIN (09/26/2009), DEPRESSION (06/12/2007), DYSPNEA (07/28/2010), ERECTILE DYSFUNCTION (02/24/2009), GERD (06/09/2007), GERD (gastroesophageal reflux disease), GLUCOSE INTOLERANCE (01/28/2010), Gout (2017), HEMORRHOIDS (06/09/2007), History of colonic polyps (08/07/2015), HYPERLIPIDEMIA (02/07/2008), HYPERTENSION (06/09/2007), HYPOKALEMIA (01/28/2010), Impaired glucose tolerance (01/23/2011), LOW BACK PAIN (06/12/2007), MYOCARDIAL PERFUSION SCAN, WITH STRESS TEST, ABNORMAL (10/08/2009), PARESTHESIA (04/25/2008), SHOULDER PAIN, LEFT (04/25/2008), URI (08/19/2008), and VITAMIN B12 DEFICIENCY (02/09/2010).  PSH:    Past Surgical History:  Procedure Laterality Date   COLONOSCOPY     CORONARY STENT PLACEMENT     from right radial    Current Outpatient Medications  Medication Sig Dispense Refill   amLODipine (NORVASC) 5 MG tablet Take 1 tablet by mouth once daily 90 tablet 3   aspirin EC 81 MG tablet Take 81 mg by mouth daily.     carvedilol (COREG) 25 MG tablet Take 1 tablet (25 mg total) by mouth 2 (two) times daily with a meal. 180 tablet 1   diclofenac sodium (VOLTAREN) 1 % GEL Apply 2 g topically 4 (four) times daily as needed. 100 g 5   fexofenadine (ALLEGRA) 180 MG tablet Take 1 tablet (180 mg total) by mouth  daily. As needed 90 tablet 3   fluticasone (FLONASE) 50 MCG/ACT nasal spray USE TWO SPRAYS IN EACH NOSTRIL ONCE DAILY 48 g 5   furosemide (LASIX) 20 MG tablet Take 1 tablet (20 mg total) by mouth every other day. Mon/Wed/Friday 20 tablet 3   gabapentin (NEURONTIN) 100 MG capsule  TAKE 2 CAPSULES BY MOUTH THREE TIMES DAILY 180 capsule 1   lisinopril-hydrochlorothiazide (ZESTORETIC) 20-12.5 MG tablet Take 1 tablet by mouth daily. 90 tablet 3   meclizine (ANTIVERT) 12.5 MG tablet Take 1 tablet (12.5 mg total) by mouth 3 (three) times daily as needed for dizziness. 30 tablet 1   MITIGARE 0.6 MG CAPS Take 1 capsule by mouth once daily 90 capsule 0   potassium chloride (KLOR-CON) 10 MEQ tablet Take 2 tablets (20 mEq total) by mouth daily. Take an extra tablet every other day when you take your Furosemide (Lasix). 180 tablet 3   rosuvastatin (CRESTOR) 10 MG tablet TAKE 1 TABLET BY MOUTH ONCE DAILY. ANNUAL APPOINTMENT IS DUE. MUST SEE PROVIDER FOR FURTHER REFILLS. 90 tablet 3   tamsulosin (FLOMAX) 0.4 MG CAPS capsule Take 1 capsule (0.4 mg total) by mouth daily. 90 capsule 3   doxycycline (VIBRA-TABS) 100 MG tablet Take 1 tablet (100 mg total) by mouth 2 (two) times daily. (Patient not taking: Reported on 08/16/2022) 20 tablet 0   meloxicam (MOBIC) 7.5 MG tablet TAKE 1 TABLET BY MOUTH ONCE DAILY AS NEEDED FOR PAIN (Patient not taking: Reported on 08/16/2022) 30 tablet 0   predniSONE (DELTASONE) 10 MG tablet 2 tabs by mouth per day for 5 days (Patient not taking: Reported on 08/16/2022) 10 tablet 0   No current facility-administered medications for this visit.     Allergies:   Diclofenac sodium   Social History:  The patient  reports that he has never smoked. He has never used smokeless tobacco. He reports that he does not drink alcohol and does not use drugs.   Family History:   family history includes Alcohol abuse in his brother and sister; Coronary artery disease in an other family member; Heart attack in an other family member; Heart disease in his brother, father, and mother; Hyperlipidemia in his father and mother; Hypertension in his father, mother, and another family member; Stroke in an other family member.    Review of Systems: Review of Systems  Constitutional:  Negative.   HENT: Negative.    Respiratory: Negative.    Cardiovascular:  Positive for chest pain.  Gastrointestinal: Negative.   Musculoskeletal:  Positive for myalgias.  Neurological: Negative.   Psychiatric/Behavioral: Negative.    All other systems reviewed and are negative.    PHYSICAL EXAM: VS:  BP (!) 150/70 (BP Location: Left Arm, Patient Position: Sitting, Cuff Size: Normal)   Pulse 61   Ht '5\' 8"'$  (1.727 m)   Wt 210 lb 2 oz (95.3 kg)   SpO2 97%   BMI 31.95 kg/m  , BMI Body mass index is 31.95 kg/m. Constitutional:  oriented to person, place, and time. No distress.  HENT:  Head: Grossly normal Eyes:  no discharge. No scleral icterus.  Neck: No JVD, no carotid bruits  Cardiovascular: Regular rate and rhythm, no murmurs appreciated Trace to 1+ pitting lower extremity edema Pulmonary/Chest: Clear to auscultation bilaterally, no wheezes or rails Abdominal: Soft.  no distension.  no tenderness.  Musculoskeletal: Normal range of motion Neurological:  normal muscle tone. Coordination normal. No atrophy Skin: Skin warm and dry Psychiatric: normal affect, pleasant  Recent  Labs: 10/27/2021: TSH 2.57 12/04/2021: B Natriuretic Peptide 119.7; Hemoglobin 12.5; Magnesium 2.0; Platelets 202 04/26/2022: ALT 11; BUN 12; Creatinine, Ser 0.99; Potassium 3.5; Sodium 142    Lipid Panel Lab Results  Component Value Date   CHOL 123 04/26/2022   HDL 27.40 (L) 04/26/2022   LDLCALC 71 12/07/2018   TRIG 234.0 (H) 04/26/2022    Wt Readings from Last 3 Encounters:  08/16/22 210 lb 2 oz (95.3 kg)  04/26/22 215 lb 12.8 oz (97.9 kg)  02/11/22 214 lb (97.1 kg)     ASSESSMENT AND PLAN:  Atherosclerosis of native coronary artery of native heart with stable angina pectoris ([place]) -  Recent Myoview with no significant ischemia Currently with no symptoms of angina. No further workup at this time. Continue current medication regimen.  Claudication/leg pain Normal ABIs Recommended regular  walking program  Essential hypertension - Plan: EKG 12-Lead Blood pressure running high in the office, improved at home Numbers may improve with resumption of his Lasix  Chronic diastolic CHF Ran out of his Lasix for more than 1 week now with leg swelling, chest congestion Restart Lasix daily this week then down to 3 days a week next week Potassium up to 30 mill equivalents daily given chronic hypokalemia  Mixed hyperlipidemia - Plan: EKG 12-Lead Cholesterol is at goal on the current lipid regimen. No changes to the medications were made.  Other emphysema ([place]) - Plan: EKG 12-Lead Reports he has stopped smoking Some of his shortness of breath could be secondary to underlying COPD  Gastroesophageal reflux disease without esophagitis On PPI   Total encounter time more than 30 minutes  Greater than 50% was spent in counseling and coordination of care with the patient  Orders Placed This Encounter  Procedures   EKG 12-Lead     Signed, Esmond Plants, M.D., Ph.D. 08/16/2022  [place], [place]

## 2022-08-16 ENCOUNTER — Encounter: Payer: Self-pay | Admitting: Cardiovascular Disease

## 2022-08-16 ENCOUNTER — Ambulatory Visit: Payer: Medicare HMO | Attending: Cardiovascular Disease | Admitting: Cardiovascular Disease

## 2022-08-16 VITALS — BP 140/70 | HR 61 | Ht 68.0 in | Wt 210.1 lb

## 2022-08-16 DIAGNOSIS — I10 Essential (primary) hypertension: Secondary | ICD-10-CM | POA: Diagnosis not present

## 2022-08-16 DIAGNOSIS — E782 Mixed hyperlipidemia: Secondary | ICD-10-CM | POA: Diagnosis not present

## 2022-08-16 DIAGNOSIS — E119 Type 2 diabetes mellitus without complications: Secondary | ICD-10-CM | POA: Diagnosis not present

## 2022-08-16 DIAGNOSIS — E1159 Type 2 diabetes mellitus with other circulatory complications: Secondary | ICD-10-CM

## 2022-08-16 DIAGNOSIS — I110 Hypertensive heart disease with heart failure: Secondary | ICD-10-CM | POA: Diagnosis not present

## 2022-08-16 DIAGNOSIS — I5032 Chronic diastolic (congestive) heart failure: Secondary | ICD-10-CM | POA: Diagnosis not present

## 2022-08-16 DIAGNOSIS — I25118 Atherosclerotic heart disease of native coronary artery with other forms of angina pectoris: Secondary | ICD-10-CM | POA: Diagnosis not present

## 2022-08-16 DIAGNOSIS — I6523 Occlusion and stenosis of bilateral carotid arteries: Secondary | ICD-10-CM | POA: Diagnosis not present

## 2022-08-16 MED ORDER — POTASSIUM CHLORIDE ER 10 MEQ PO TBCR: 30.0000 meq | EXTENDED_RELEASE_TABLET | Freq: Every day | ORAL | 3 refills | Status: DC

## 2022-08-16 MED ORDER — FUROSEMIDE 20 MG PO TABS: ORAL_TABLET | ORAL | 3 refills | Status: DC

## 2022-08-16 NOTE — Patient Instructions (Addendum)
Cut back on fluids  Medication Instructions:   Please increase the potassium up to 30 meq a day  2. Refill on the furosemide (take daily this week, then down to 3x a week, next week)  If you need a refill on your cardiac medications before your next appointment, please call your pharmacy.   Lab work: No new labs needed  Testing/Procedures: No new testing needed  Follow-Up: At [facility], you and your health needs are our priority.  As part of our continuing mission to provide you with exceptional heart care, we have created designated Provider Care Teams.  These Care Teams include your primary Cardiologist (physician) and Advanced Practice Providers (APPs -  Physician Assistants and Nurse Practitioners) who all work together to provide you with the care you need, when you need it.  You will need a follow up appointment in 12 months  Providers on your designated Care Team:   Murray Hodgkins, NP Christell Faith, PA-C Cadence Kathlen Mody, [place]  COVID-19 Vaccine Information can be found at: [email or web address] For questions related to vaccine distribution or appointments, please email [email or web address] or call [phone].

## 2022-09-02 ENCOUNTER — Other Ambulatory Visit: Payer: Self-pay | Admitting: Internal Medicine

## 2022-09-07 IMAGING — MR MR LUMBAR SPINE W/O CM
5 series · 30 of 48 positions shown · non-contrast
Comparison: 03/09/2018

CLINICAL DATA: Low back pain with bilateral leg and hip pain right
worse than left.

EXAM:
MRI LUMBAR SPINE WITHOUT CONTRAST
TECHNIQUE: Multiplanar, multisequence MR imaging of the lumbar spine was
performed. No intravenous contrast was administered.

[Series 5: T2 · sagittal · 4.0mm · 0.81mm/px · 6 of 17 slices shown (1 of 2)]
[im 1/17]
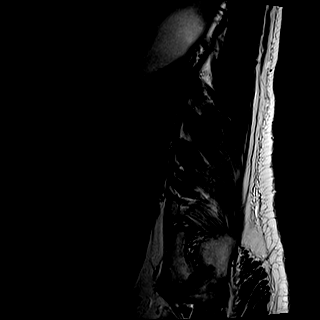
[im 4/17]
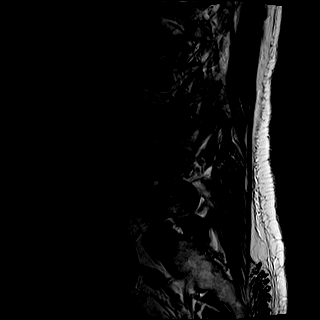
[im 7/17]
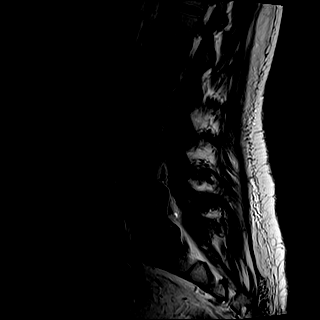
[im 10/17]
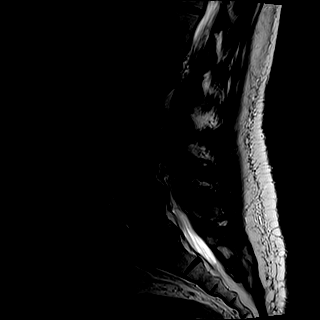
[im 13/17]
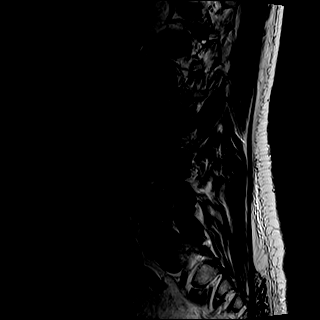
[im 17/17]
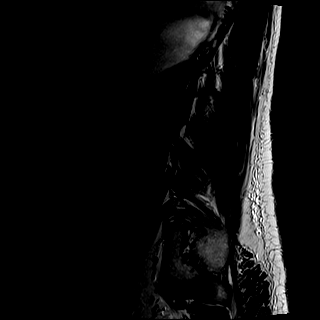

[Series 6: T1 · sagittal · 4.0mm · 0.81mm/px · 7 of 17 slices shown (1 of 2)]
[im 1/17]
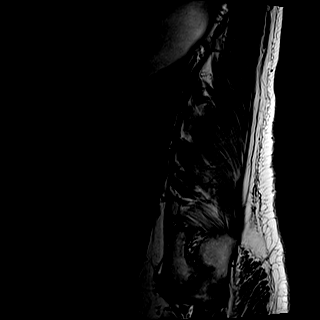
[im 3/17]
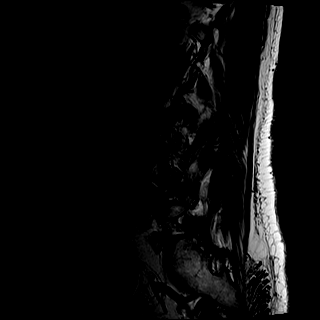
[im 6/17]
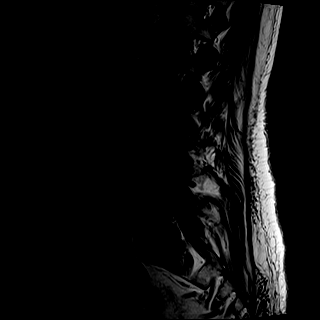
[im 9/17]
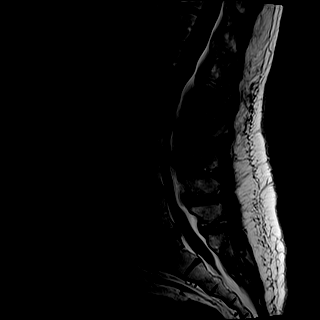
[im 11/17]
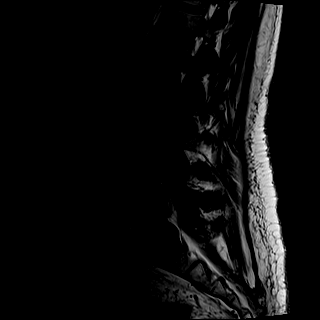
[im 14/17]
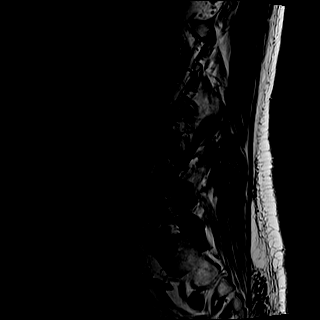
[im 17/17]
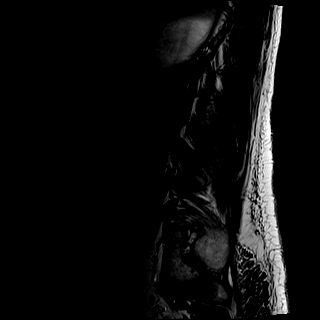

[Series 7: STIR · sagittal · 4.0mm · 0.41mm/px · 1 of 17 slices shown]
[im 1/17]
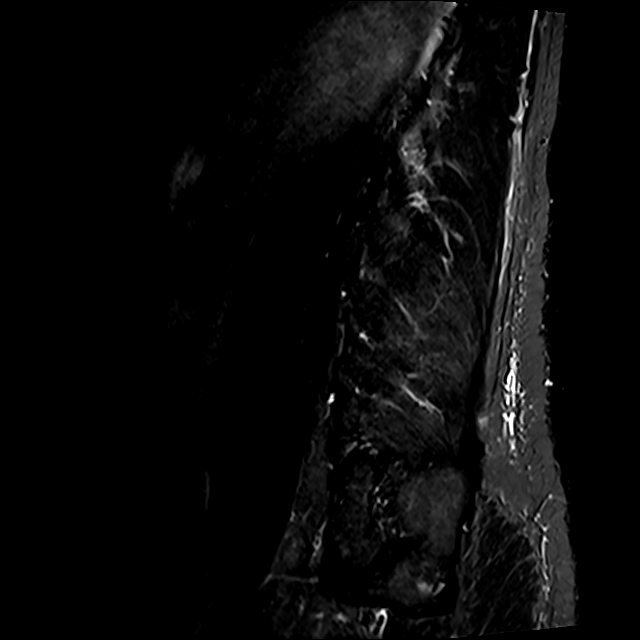

[Series 8: T2 · axial · 4.0mm · 0.78mm/px · z∈[-100,+112]mm · 8 of 36 slices shown (2 of 2)]
[im 1/36]
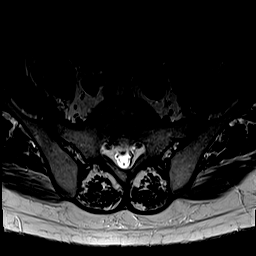
[im 6/36]
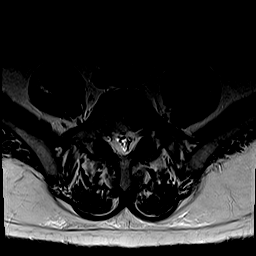
[im 11/36]
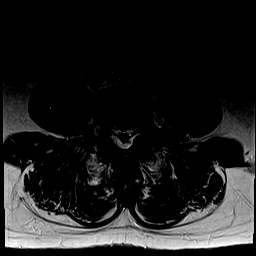
[im 17/36]
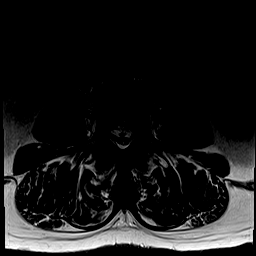
[im 19/36]
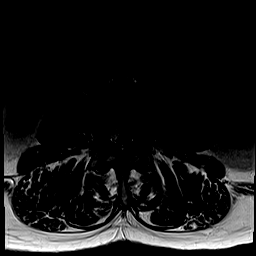
[im 25/36]
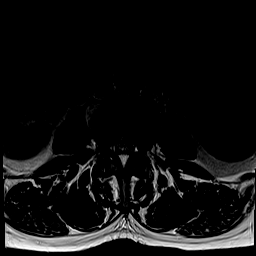
[im 30/36]
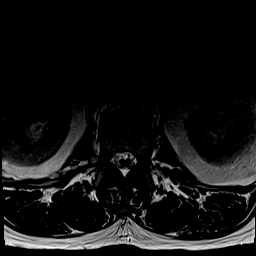
[im 36/36]
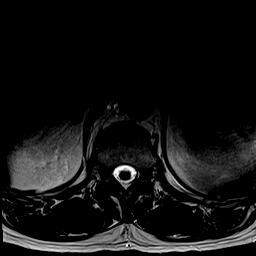

[Series 9: T1 · axial · 4.0mm · 0.39mm/px · z∈[-100,+112]mm · 8 of 36 slices shown (2 of 2)]
[im 1/36]
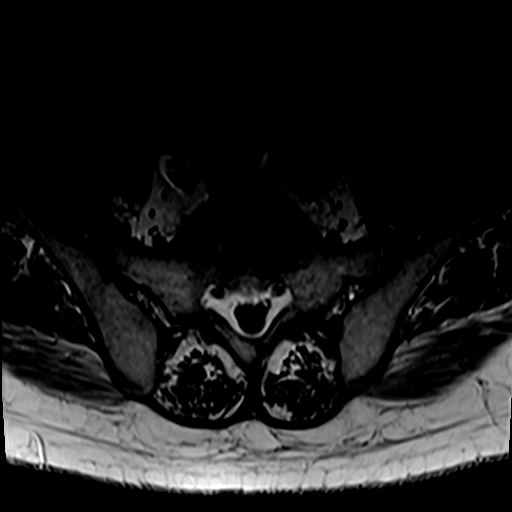
[im 6/36]
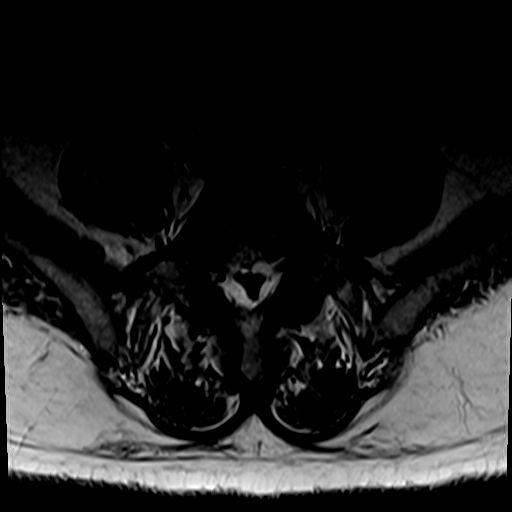
[im 11/36]
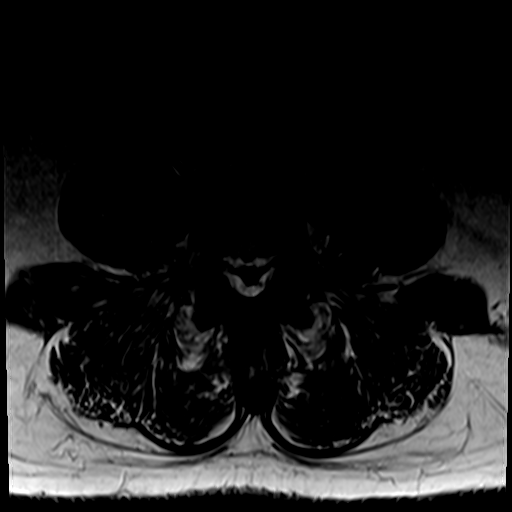
[im 17/36]
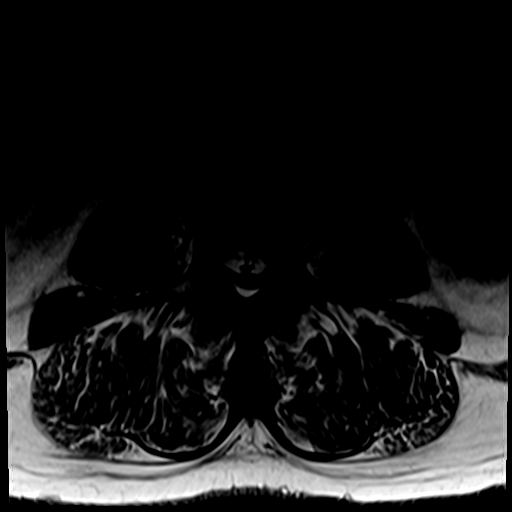
[im 19/36]
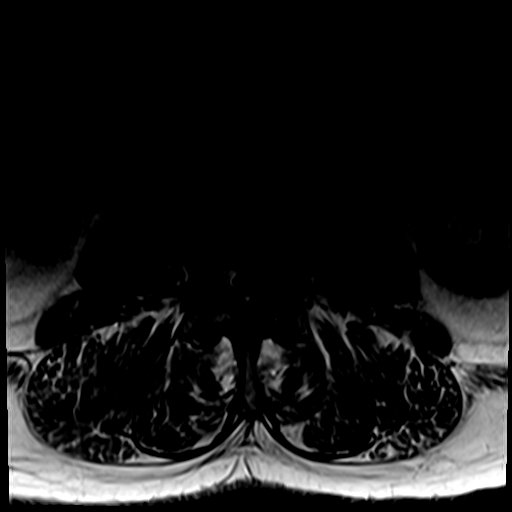
[im 25/36]
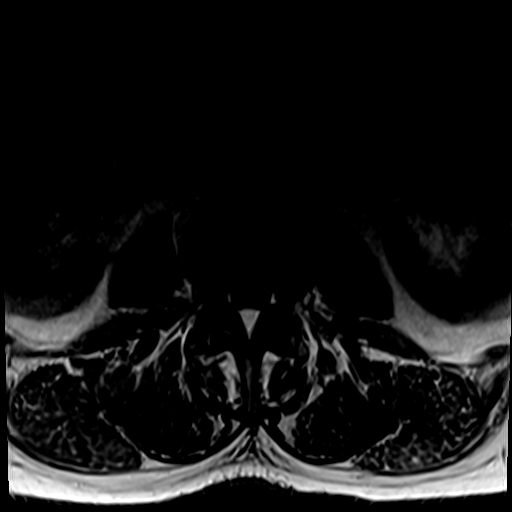
[im 30/36]
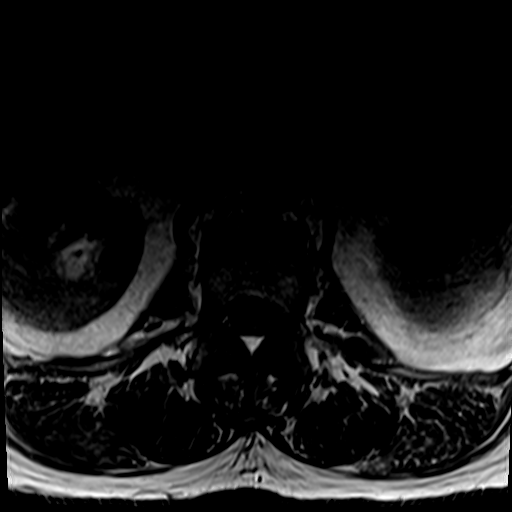
[im 36/36]
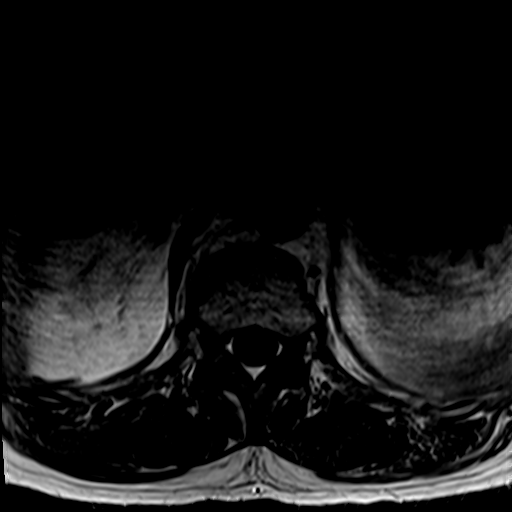

[30 of 48 positions shown; findings below may reference images not displayed]

FINDINGS: Segmentation: 5 lumbar type vertebral bodies as numbered previously.

Alignment:  No malalignment.

Vertebrae:  No fracture or primary bone lesion.

Conus medullaris and cauda equina: Conus extends to the L1-2 level.

Paraspinal and other soft tissues: Aortic atherosclerosis.
Infrarenal abdominal aortic aneurysm with maximal demonstrated
diameter 3.2 cm. This is not visibly changed since 9594.

Disc levels:

T12-L1: Normal

L1-2: Bulging of the disc. Mild facet and ligamentous hypertrophy.
Mild multifactorial spinal stenosis with crowding of the nerve roots
but without focal neural compression.

L2-3: Canal small on a congenital basis. Disc degeneration with
circumferential disc protrusion. Facet and ligamentous hypertrophy.
Severe multifactorial stenosis likely to cause neural compression.
Slight worsening since 9594.

L3-4: Canal small on a congenital basis. Disc degeneration with
circumferential disc protrusion. Pronounced facet and ligamentous
hypertrophy. Severe multifactorial spinal stenosis likely to cause
neural compression. Slight worsening since 9594.

L4-5: Diminutive thecal sac. Abundant epidural fat. Bulging of the
disc. Facet and ligamentous hypertrophy. Thecal sac is constricted
and neural compression could occur at this level as well. The facet
arthropathy is associated with some edema, likely contributing to
low back pain.

L5-S1: Mild bulging of the disc. Mild facet osteoarthritis. No
compressive stenosis.
IMPRESSION: 1. Severe multifactorial spinal stenosis at L2-3 and L3-4 likely to
cause neural compression. Findings are worse at L3-4. Slight
worsening since [DATE]. Spinal stenosis at L4-5 because of a small canal, abundant
epidural fat and bulging of the disc. Facet and ligamentous
hypertrophy. Neural compression could occur at this level as well.
The facet arthropathy is associated with edema and could contribute
to low back pain.
3. L1-2 disc bulge. Facet osteoarthritis. Multifactorial stenosis
with crowding of the nerve roots but without definite focal neural
compression.
4. Aortic atherosclerosis. Infrarenal abdominal aortic aneurysm with
maximal diameter 3.2 cm, not visibly changed since 9594. Recommend
follow-up ultrasound every 3 years. This recommendation follows ACR
consensus guidelines: White Paper of the ACR Incidental Findings
Committee II on Vascular Findings. [HOSPITAL] 5784;

## 2022-10-27 ENCOUNTER — Ambulatory Visit: Payer: Medicare HMO | Admitting: Internal Medicine

## 2022-10-27 ENCOUNTER — Encounter: Payer: Self-pay | Admitting: Internal Medicine

## 2022-10-27 ENCOUNTER — Ambulatory Visit (INDEPENDENT_AMBULATORY_CARE_PROVIDER_SITE_OTHER): Payer: Medicare HMO | Admitting: Internal Medicine

## 2022-10-27 VITALS — BP 116/62 | HR 70 | Temp 98.6°F | Ht 68.0 in | Wt 206.0 lb

## 2022-10-27 DIAGNOSIS — M109 Gout, unspecified: Secondary | ICD-10-CM | POA: Diagnosis not present

## 2022-10-27 DIAGNOSIS — E538 Deficiency of other specified B group vitamins: Secondary | ICD-10-CM | POA: Diagnosis not present

## 2022-10-27 DIAGNOSIS — K648 Other hemorrhoids: Secondary | ICD-10-CM | POA: Diagnosis not present

## 2022-10-27 DIAGNOSIS — E559 Vitamin D deficiency, unspecified: Secondary | ICD-10-CM | POA: Diagnosis not present

## 2022-10-27 DIAGNOSIS — E1165 Type 2 diabetes mellitus with hyperglycemia: Secondary | ICD-10-CM

## 2022-10-27 DIAGNOSIS — Z0001 Encounter for general adult medical examination with abnormal findings: Secondary | ICD-10-CM | POA: Diagnosis not present

## 2022-10-27 LAB — URINALYSIS, ROUTINE W REFLEX MICROSCOPIC
Bilirubin Urine: NEGATIVE
Hgb urine dipstick: NEGATIVE
Ketones, ur: NEGATIVE
Leukocytes,Ua: NEGATIVE
Nitrite: NEGATIVE
RBC / HPF: NONE SEEN (ref 0–?)
Specific Gravity, Urine: 1.025 (ref 1.000–1.030)
Total Protein, Urine: NEGATIVE
Urine Glucose: NEGATIVE
Urobilinogen, UA: 0.2 (ref 0.0–1.0)
pH: 6 (ref 5.0–8.0)

## 2022-10-27 LAB — CBC WITH DIFFERENTIAL/PLATELET
Basophils Absolute: 0 10*3/uL (ref 0.0–0.1)
Basophils Relative: 0.8 % (ref 0.0–3.0)
Eosinophils Absolute: 0.1 10*3/uL (ref 0.0–0.7)
Eosinophils Relative: 2 % (ref 0.0–5.0)
HCT: 36 % — ABNORMAL LOW (ref 39.0–52.0)
Hemoglobin: 12.1 g/dL — ABNORMAL LOW (ref 13.0–17.0)
Lymphocytes Relative: 38.2 % (ref 12.0–46.0)
Lymphs Abs: 2.4 10*3/uL (ref 0.7–4.0)
MCHC: 33.6 g/dL (ref 30.0–36.0)
MCV: 88 fl (ref 78.0–100.0)
Monocytes Absolute: 0.7 10*3/uL (ref 0.1–1.0)
Monocytes Relative: 11.1 % (ref 3.0–12.0)
Neutro Abs: 3 10*3/uL (ref 1.4–7.7)
Neutrophils Relative %: 47.9 % (ref 43.0–77.0)
Platelets: 223 10*3/uL (ref 150.0–400.0)
RBC: 4.09 Mil/uL — ABNORMAL LOW (ref 4.22–5.81)
RDW: 14.9 % (ref 11.5–15.5)
WBC: 6.3 10*3/uL (ref 4.0–10.5)

## 2022-10-27 LAB — HEPATIC FUNCTION PANEL
ALT: 11 U/L (ref 0–53)
AST: 19 U/L (ref 0–37)
Albumin: 3.8 g/dL (ref 3.5–5.2)
Alkaline Phosphatase: 62 U/L (ref 39–117)
Bilirubin, Direct: 0.1 mg/dL (ref 0.0–0.3)
Total Bilirubin: 0.4 mg/dL (ref 0.2–1.2)
Total Protein: 7.1 g/dL (ref 6.0–8.3)

## 2022-10-27 LAB — TSH: TSH: 2.46 u[IU]/mL (ref 0.35–5.50)

## 2022-10-27 LAB — VITAMIN B12: Vitamin B-12: >1500 pg/mL — ABNORMAL HIGH (ref 211–911)

## 2022-10-27 LAB — BASIC METABOLIC PANEL
BUN: 12 mg/dL (ref 6–23)
CO2: 30 mEq/L (ref 19–32)
Calcium: 9.9 mg/dL (ref 8.4–10.5)
Chloride: 106 mEq/L (ref 96–112)
Creatinine, Ser: 1.03 mg/dL (ref 0.40–1.50)
GFR: 68.44 mL/min (ref >60.00)
Glucose, Bld: 99 mg/dL (ref 70–99)
Potassium: 3.6 mEq/L (ref 3.5–5.1)
Sodium: 144 mEq/L (ref 135–145)

## 2022-10-27 LAB — LIPID PANEL
Cholesterol: 127 mg/dL (ref 0–200)
HDL: 29.4 mg/dL — ABNORMAL LOW (ref >39.00)
LDL Cholesterol: 67 mg/dL (ref 0–99)
NonHDL: 97.3
Total CHOL/HDL Ratio: 4
Triglycerides: 154 mg/dL — ABNORMAL HIGH (ref 0.0–149.0)
VLDL: 30.8 mg/dL (ref 0.0–40.0)

## 2022-10-27 LAB — HEMOGLOBIN A1C: Hgb A1c MFr Bld: 6.4 % (ref 4.6–6.5)

## 2022-10-27 LAB — VITAMIN D 25 HYDROXY (VIT D DEFICIENCY, FRACTURES): VITD: 25.22 ng/mL — ABNORMAL LOW (ref 30.00–100.00)

## 2022-10-27 LAB — MICROALBUMIN / CREATININE URINE RATIO
Creatinine,U: 121.7 mg/dL
Microalb Creat Ratio: 1.2 mg/g (ref 0.0–30.0)
Microalb, Ur: 1.5 mg/dL (ref 0.0–1.9)

## 2022-10-27 LAB — IBC PANEL
Iron: 52 ug/dL (ref 42–165)
Saturation Ratios: 18.6 % — ABNORMAL LOW (ref 20.0–50.0)
TIBC: 280 ug/dL (ref 250.0–450.0)
Transferrin: 200 mg/dL — ABNORMAL LOW (ref 212.0–360.0)

## 2022-10-27 LAB — FERRITIN: Ferritin: 80.7 ng/mL (ref 22.0–322.0)

## 2022-10-27 MED ORDER — HYDROCORTISONE (PERIANAL) 2.5 % EX CREA: 1.0000 | TOPICAL_CREAM | Freq: Two times a day (BID) | CUTANEOUS | 1 refills | Status: DC

## 2022-10-27 NOTE — Progress Notes (Signed)
Patient ID: Travis Carey, male   DOB: 11-17-1940, 81 y.o.   MRN: [id number]         Chief Complaint:: wellness exam and 57mofollow up (Back pain) , chronic lbp, hemorrhoid with discomfort and intermittent BRB, acute gout       HPI:  Travis MARCYis a 82y.o. male here for wellness exam; declines flu shot, may have shingrix done at the pharmacy, aged out of colonoscopy follow up screening, declines covid booster, o/w up to date                        Also has 1 wk onset bialteral hand sweing and pain c/w previous episodes of acute gout, asking for depomedrol.  Pt denies chest pain, increased sob or doe, wheezing, orthopnea, PND, increased LE swelling, palpitations, dizziness or syncope.   Pt denies polydipsia, polyuria, or new focal neuro s/s.    Pt denies fever, wt loss, night sweats, loss of appetite, or other constitutional symptoms  Denies worsening reflux, abd pain, dysphagia, n/v, bowel change bt has rectal discomfort and intermittent small volume BRBPR.      Wt Readings from Last 3 Encounters:  10/27/22 206 lb (93.4 kg)  08/16/22 210 lb 2 oz (95.3 kg)  04/26/22 215 lb 12.8 oz (97.9 kg)   BP Readings from Last 3 Encounters:  10/27/22 116/62  08/16/22 (!) 140/70  04/26/22 140/60   Immunization History  Administered Date(s) Administered   Influenza Split 07/27/2011   Influenza Whole 08/09/1997, 08/28/2009, 07/28/2010   Influenza, High Dose Seasonal PF 08/11/2017, 08/17/2018, 07/23/2019, 07/23/2019   Influenza,inj,Quad PF,6+ Mos 06/12/2014, 08/07/2015   Influenza-Unspecified 06/11/2013, 06/12/2014, 08/07/2015, 07/13/2021   PFIZER Comirnaty(Gray Top)Covid-19 Tri-Sucrose Vaccine 02/05/2020, 02/26/2020   PFIZER(Purple Top)SARS-COV-2 Vaccination 01/30/2020, 10/01/2020, 04/22/2022   Pneumococcal Conjugate-13 11/01/2013   Pneumococcal Polysaccharide-23 08/19/2008   Td 08/19/2008   Tdap 12/07/2018   Zoster, Live 01/31/2013   There are no preventive care reminders to display for this  patient.     Past Medical History:  Diagnosis Date   Allergic rhinitis, cause unspecified 01/31/2013   ANEMIA-NOS 01/28/2010   BPH (benign prostatic hypertrophy) 04/25/2013   CAD 02/17/2010   CAROTID BRUIT 11/18/2009   Carpal tunnel syndrome 05/20/2008   Cervical radiculitis 04/17/2012   CHEST PAIN 09/26/2009   DEPRESSION 06/12/2007   DYSPNEA 07/28/2010   ERECTILE DYSFUNCTION 02/24/2009   GERD 06/09/2007   GERD (gastroesophageal reflux disease)    GLUCOSE INTOLERANCE 01/28/2010   Gout 2017   Dr.Maddisen Vought   HEMORRHOIDS 06/09/2007   History of colonic polyps 08/07/2015   HYPERLIPIDEMIA 02/07/2008   HYPERTENSION 06/09/2007   HYPOKALEMIA 01/28/2010   Impaired glucose tolerance 01/23/2011   LOW BACK PAIN 06/12/2007   MYOCARDIAL PERFUSION SCAN, WITH STRESS TEST, ABNORMAL 10/08/2009   PARESTHESIA 04/25/2008   SHOULDER PAIN, LEFT 04/25/2008   URI 08/19/2008   VITAMIN B12 DEFICIENCY 02/09/2010   Past Surgical History:  Procedure Laterality Date   COLONOSCOPY     CORONARY STENT PLACEMENT     from right radial    reports that he has never smoked. He has never used smokeless tobacco. He reports that he does not drink alcohol and does not use drugs. family history includes Alcohol abuse in his brother and sister; Coronary artery disease in an other family member; Heart attack in an other family member; Heart disease in his brother, father, and mother; Hyperlipidemia in his father and mother; Hypertension in his father, mother, and  another family member; Stroke in an other family member. Allergies  Allergen Reactions   Diclofenac Sodium     REACTION: maks pt drowsey   Current Outpatient Medications on File Prior to Visit  Medication Sig Dispense Refill   amLODipine (NORVASC) 5 MG tablet Take 1 tablet by mouth once daily 90 tablet 3   aspirin EC 81 MG tablet Take 81 mg by mouth daily.     carvedilol (COREG) 25 MG tablet TAKE 1 TABLET BY MOUTH TWICE DAILY WITH A MEAL 180 tablet 2   diclofenac sodium (VOLTAREN)  1 % GEL Apply 2 g topically 4 (four) times daily as needed. 100 g 5   fexofenadine (ALLEGRA) 180 MG tablet Take 1 tablet (180 mg total) by mouth daily. As needed 90 tablet 3   fluticasone (FLONASE) 50 MCG/ACT nasal spray USE TWO SPRAYS IN EACH NOSTRIL ONCE DAILY 48 g 5   furosemide (LASIX) 20 MG tablet Take one tablet ('20mg'$ ) by mouth daily this week - starting next week take lasix '20mg'$  three times a week. 90 tablet 3   gabapentin (NEURONTIN) 100 MG capsule TAKE 2 CAPSULES BY MOUTH THREE TIMES DAILY 180 capsule 1   lisinopril-hydrochlorothiazide (ZESTORETIC) 20-12.5 MG tablet Take 1 tablet by mouth daily. 90 tablet 3   meclizine (ANTIVERT) 12.5 MG tablet Take 1 tablet (12.5 mg total) by mouth 3 (three) times daily as needed for dizziness. 30 tablet 1   MITIGARE 0.6 MG CAPS Take 1 capsule by mouth once daily 90 capsule 0   potassium chloride (KLOR-CON) 10 MEQ tablet Take 3 tablets (30 mEq total) by mouth daily. 270 tablet 3   rosuvastatin (CRESTOR) 10 MG tablet TAKE 1 TABLET BY MOUTH ONCE DAILY. ANNUAL APPOINTMENT IS DUE. MUST SEE PROVIDER FOR FURTHER REFILLS. 90 tablet 3   tamsulosin (FLOMAX) 0.4 MG CAPS capsule Take 1 capsule (0.4 mg total) by mouth daily. 90 capsule 3   doxycycline (VIBRA-TABS) 100 MG tablet Take 1 tablet (100 mg total) by mouth 2 (two) times daily. (Patient not taking: Reported on 08/16/2022) 20 tablet 0   meloxicam (MOBIC) 7.5 MG tablet TAKE 1 TABLET BY MOUTH ONCE DAILY AS NEEDED FOR PAIN (Patient not taking: Reported on 08/16/2022) 30 tablet 0   predniSONE (DELTASONE) 10 MG tablet 2 tabs by mouth per day for 5 days (Patient not taking: Reported on 08/16/2022) 10 tablet 0   No current facility-administered medications on file prior to visit.        ROS:  All others reviewed and negative.  Objective        PE:  BP 116/62 (BP Location: Right Arm, Patient Position: Sitting, Cuff Size: Large)   Pulse 70   Temp 98.6 F (37 C) (Oral)   Ht '5\' 8"'$  (1.727 m)   Wt 206 lb (93.4 kg)    SpO2 98%   BMI 31.32 kg/m                 Constitutional: Pt appears in NAD               HENT: Head: NCAT.                Right Ear: External ear normal.                 Left Ear: External ear normal.                Eyes: . Pupils are equal, round, and reactive to light. Conjunctivae and EOM are normal  Nose: without d/c or deformity               Neck: Neck supple. Gross normal ROM               Cardiovascular: Normal rate and regular rhythm.                 Pulmonary/Chest: Effort normal and breath sounds without rales or wheezing.                Abd:  Soft, NT, ND, + BS, no organomegaly               Neurological: Pt is alert. At baseline orientation, motor grossly intact               Skin: Skin is warm. No rashes, no other new lesions, LE edema - none               Bilateral diffuse hand tender swelling primarily mcp's               Psychiatric: Pt behavior is normal without agitation   Micro: none  Cardiac tracings I have personally interpreted today:  none  Pertinent Radiological findings (summarize): none   Lab Results  Component Value Date   WBC 6.3 10/27/2022   HGB 12.1 (L) 10/27/2022   HCT 36.0 (L) 10/27/2022   PLT 223.0 10/27/2022   GLUCOSE 99 10/27/2022   CHOL 127 10/27/2022   TRIG 154.0 (H) 10/27/2022   HDL 29.40 (L) 10/27/2022   LDLDIRECT 58.0 04/26/2022   LDLCALC 67 10/27/2022   ALT 11 10/27/2022   AST 19 10/27/2022   NA 144 10/27/2022   K 3.6 10/27/2022   CL 106 10/27/2022   CREATININE 1.03 10/27/2022   BUN 12 10/27/2022   CO2 30 10/27/2022   TSH 2.46 10/27/2022   PSA 0.38 04/27/2021   INR 1.0 ratio 10/23/2009   HGBA1C 6.4 10/27/2022   MICROALBUR 1.5 10/27/2022   Assessment/Plan:  Travis Carey is a 82 y.o. Black or African American [2] male with  has a past medical history of Allergic rhinitis, cause unspecified (01/31/2013), ANEMIA-NOS (01/28/2010), BPH (benign prostatic hypertrophy) (04/25/2013), CAD (02/17/2010), CAROTID BRUIT  (11/18/2009), Carpal tunnel syndrome (05/20/2008), Cervical radiculitis (04/17/2012), CHEST PAIN (09/26/2009), DEPRESSION (06/12/2007), DYSPNEA (07/28/2010), ERECTILE DYSFUNCTION (02/24/2009), GERD (06/09/2007), GERD (gastroesophageal reflux disease), GLUCOSE INTOLERANCE (01/28/2010), Gout (2017), HEMORRHOIDS (06/09/2007), History of colonic polyps (08/07/2015), HYPERLIPIDEMIA (02/07/2008), HYPERTENSION (06/09/2007), HYPOKALEMIA (01/28/2010), Impaired glucose tolerance (01/23/2011), LOW BACK PAIN (06/12/2007), MYOCARDIAL PERFUSION SCAN, WITH STRESS TEST, ABNORMAL (10/08/2009), PARESTHESIA (04/25/2008), SHOULDER PAIN, LEFT (04/25/2008), URI (08/19/2008), and VITAMIN B12 DEFICIENCY (02/09/2010).  Encounter for well adult exam with abnormal findings Age and sex appropriate education and counseling updated with regular exercise and diet Referrals for preventative services - none needed Immunizations addressed - declines covid booster and flu shot, for shingrix at the pharmacy Smoking counseling  - none needed Evidence for depression or other mood disorder - none significant Most recent labs reviewed. I have personally reviewed and have noted: 1) the patient's medical and social history 2) The patient's current medications and supplements 3) The patient's height, weight, and BMI have been recorded in the chart   Acute gouty arthritis Mild to mod, for depomedrol I'm 80 mg,  to f/u any worsening symptoms or concerns  B12 deficiency Lab Results  Component Value Date   VITAMINB12 >1500 (H) 10/27/2022   Stable, cont oral replacement - b12 1000 mcg qd   Diabetes ([place]) Lab Results  Component Value Date   HGBA1C 6.4 10/27/2022   Stable, pt to continue current medical treatment  - diet, wt cotnrol, excercise   Vitamin D deficiency Last vitamin D Lab Results  Component Value Date   VD25OH 25.22 (L) 10/27/2022   Low, to start oral replacement   Internal hemorrhoid, bleeding For anusol hc cream prn, also refer  GI, for cbc with labs  Followup: Return in about 6 months (around 04/27/2023).  Cathlean Cower, MD 10/28/2022 9:01 PM [facility]

## 2022-10-27 NOTE — Patient Instructions (Signed)
You had the steroid shot today  Please take all new medication as prescribed - the cream as needed  Please continue all other medications as before, and refills have been done if requested.  Please have the pharmacy call with any other refills you may need.  Please continue your efforts at being more active, low cholesterol diet, and weight control.  You are otherwise up to date with prevention measures today.  Please keep your appointments with your specialists as you may have planned  You will be contacted regarding the referral for: Dr Carlean Purl - Gastroenterology  Please go to the LAB at the blood drawing area for the tests to be done  You will be contacted by phone if any changes need to be made immediately.  Otherwise, you will receive a letter about your results with an explanation, but please check with MyChart first.  Please remember to sign up for MyChart if you have not done so, as this will be important to you in the future with finding out test results, communicating by private email, and scheduling acute appointments online when needed.  Please make an Appointment to return in 6 months, or sooner if needed, also with Lab Appointment for testing done 3-5 days before at the [facility] (so this is for TWO appointments - please see the scheduling desk as you leave)

## 2022-10-28 ENCOUNTER — Encounter: Payer: Self-pay | Admitting: Internal Medicine

## 2022-10-28 NOTE — Assessment & Plan Note (Signed)
Age and sex appropriate education and counseling updated with regular exercise and diet Referrals for preventative services - none needed Immunizations addressed - declines covid booster and flu shot, for shingrix at the pharmacy Smoking counseling  - none needed Evidence for depression or other mood disorder - none significant Most recent labs reviewed. I have personally reviewed and have noted: 1) the patient's medical and social history 2) The patient's current medications and supplements 3) The patient's height, weight, and BMI have been recorded in the chart

## 2022-10-28 NOTE — Assessment & Plan Note (Addendum)
For anusol hc cream prn, also refer GI, for cbc with labs

## 2022-10-28 NOTE — Assessment & Plan Note (Signed)
Last vitamin D Lab Results  Component Value Date   VD25OH 25.22 (L) 10/27/2022   Low, to start oral replacement

## 2022-10-28 NOTE — Assessment & Plan Note (Signed)
Lab Results  Component Value Date   HGBA1C 6.4 10/27/2022   Stable, pt to continue current medical treatment  - diet, wt cotnrol, excercise

## 2022-10-28 NOTE — Assessment & Plan Note (Signed)
Lab Results  Component Value Date   VITAMINB12 >1500 (H) 10/27/2022   Stable, cont oral replacement - b12 1000 mcg qd

## 2022-10-28 NOTE — Assessment & Plan Note (Signed)
Mild to mod, for depomedrol I'm 80 mg,  to f/u any worsening symptoms or concerns

## 2022-11-10 MED ORDER — METHYLPREDNISOLONE ACETATE 80 MG/ML IJ SUSP
80.0000 mg | Freq: Once | INTRAMUSCULAR | Status: AC
  Administered 2022-10-27: 80 mg via INTRAMUSCULAR

## 2022-11-10 NOTE — Addendum Note (Signed)
Addended by: Max Sane on: 11/10/2022 04:12 PM   Modules accepted: Orders

## 2022-11-24 ENCOUNTER — Encounter: Payer: Self-pay | Admitting: Gastroenterology

## 2022-12-04 ENCOUNTER — Other Ambulatory Visit: Payer: Self-pay | Admitting: Cardiovascular Disease

## 2022-12-04 ENCOUNTER — Other Ambulatory Visit: Payer: Self-pay | Admitting: Internal Medicine

## 2022-12-04 NOTE — Telephone Encounter (Signed)
Please refill as per office routine med refill policy (all routine meds to be refilled for 3 mo or monthly (per pt preference) up to one year from last visit, then month to month grace period for 3 mo, then further med refills will have to be denied) ? ?

## 2022-12-23 ENCOUNTER — Telehealth: Payer: Self-pay | Admitting: Internal Medicine

## 2022-12-23 DIAGNOSIS — N400 Enlarged prostate without lower urinary tract symptoms: Secondary | ICD-10-CM

## 2022-12-23 NOTE — Telephone Encounter (Signed)
PT and spouse call today in regards to getting a referral set up for PT to see a urologist. With PT staying in [place] they are requesting it be someone Dr.John would suggest there.   They stated this is due to PT experiencing some new pain/tenderness from their hip into their stomach.  (PT last seen 10/27/22 but not positive if these issues were addressed then or not).  CB: [phone] PT did say that they do check their VM if they can not be reached

## 2022-12-23 NOTE — Telephone Encounter (Signed)
Referral done to urology  We had not discussed the right side pain, so please consider ROV if this is concerning to him or worsens

## 2022-12-24 NOTE — Telephone Encounter (Signed)
Left message for a call back regarding concerns

## 2022-12-30 ENCOUNTER — Ambulatory Visit
Admission: RE | Admit: 2022-12-30 | Discharge: 2022-12-30 | Disposition: A | Discharge status: Home / Self Care | Payer: Medicare HMO | Source: Ambulatory Visit | Attending: Urology | Admitting: Urology

## 2022-12-30 ENCOUNTER — Ambulatory Visit: Payer: Medicare HMO | Admitting: Urology

## 2022-12-30 VITALS — BP 166/83 | HR 70 | Ht 68.0 in | Wt 197.0 lb

## 2022-12-30 DIAGNOSIS — N401 Enlarged prostate with lower urinary tract symptoms: Secondary | ICD-10-CM

## 2022-12-30 DIAGNOSIS — R1032 Left lower quadrant pain: Secondary | ICD-10-CM | POA: Diagnosis not present

## 2022-12-30 DIAGNOSIS — R8281 Pyuria: Secondary | ICD-10-CM

## 2022-12-30 DIAGNOSIS — R351 Nocturia: Secondary | ICD-10-CM | POA: Diagnosis not present

## 2022-12-30 DIAGNOSIS — R3915 Urgency of urination: Secondary | ICD-10-CM

## 2022-12-30 DIAGNOSIS — R350 Frequency of micturition: Secondary | ICD-10-CM

## 2022-12-30 DIAGNOSIS — R109 Unspecified abdominal pain: Secondary | ICD-10-CM | POA: Diagnosis not present

## 2022-12-30 LAB — MICROSCOPIC EXAMINATION

## 2022-12-30 LAB — URINALYSIS, COMPLETE
Bilirubin, UA: NEGATIVE
Glucose, UA: NEGATIVE
Nitrite, UA: NEGATIVE
RBC, UA: NEGATIVE
Specific Gravity, UA: 1.025 (ref 1.005–1.030)
Urobilinogen, Ur: 0.2 mg/dL (ref 0.2–1.0)
pH, UA: 5.5 (ref 5.0–7.5)

## 2022-12-30 LAB — BLADDER SCAN AMB NON-IMAGING: Scan Result: 12

## 2022-12-30 NOTE — Patient Instructions (Signed)

## 2022-12-30 NOTE — Progress Notes (Signed)
I,Amy L Pierron,acting as a scribe for Hollice Espy, MD.,have documented all relevant documentation on the behalf of Hollice Espy, MD,as directed by  Hollice Espy, MD while in the presence of Hollice Espy, MD.  12/30/2022 12:11 PM   Wille Glaser Kathi Simpers 1941-02-07 [id number]  Referring provider: Biagio Borg, MD [address] [place],  [place] [address]  Chief Complaint  Patient presents with   New Patient (Initial Visit)   Benign Prostatic Hypertrophy    HPI: 82 year-old male referred for further evaluation of urinary issues. He reports having urgency, frequency, nocturia X5, leakage, chronic constipation, and left testicular soreness.   He currently takes Flomax. His urinalysis today has 6 to 10 white blood cells per high power field, otherwise is unremarkable. His most recent PSA on 04/27/2021 was 0.38.  He reports the urinary symptoms have continued to increase over the years. He denies burning with urination. He does not take the Flomax on a regular basis. He does not snore.   Results for orders placed or performed in visit on 12/30/22  Microscopic Examination   Urine  Result Value Ref Range   WBC, UA 6-10 (A) 0 - 5 /hpf   RBC, Urine 0-2 0 - 2 /hpf   Epithelial Cells (non renal) 0-10 0 - 10 /hpf   Casts Present (A) None seen /lpf   Cast Type Hyaline casts N/A   Mucus, UA Present (A) Not Estab.   Bacteria, UA Moderate (A) None seen/Few  Urinalysis, Complete  Result Value Ref Range   Specific Gravity, UA 1.025 1.005 - 1.030   pH, UA 5.5 5.0 - 7.5   Color, UA Yellow Yellow   Appearance Ur Clear Clear   Leukocytes,UA Trace (A) Negative   Protein,UA Trace (A) Negative/Trace   Glucose, UA Negative Negative   Ketones, UA Trace (A) Negative   RBC, UA Negative Negative   Bilirubin, UA Negative Negative   Urobilinogen, Ur 0.2 0.2 - 1.0 mg/dL   Nitrite, UA Negative Negative   Microscopic Examination See below:   BLADDER SCAN AMB NON-IMAGING  Result Value Ref Range    Scan Result 12 ml     IPSS     Row Name 12/30/22 1000         International Prostate Symptom Score   How often have you had the sensation of not emptying your bladder? Less than half the time     How often have you had to urinate less than every two hours? Almost always     How often have you found you stopped and started again several times when you urinated? Less than half the time     How often have you found it difficult to postpone urination? Less than half the time     How often have you had a weak urinary stream? Almost always     How often have you had to strain to start urination? Less than 1 in 5 times     How many times did you typically get up at night to urinate? 5 Times     Total IPSS Score 22       Quality of Life due to urinary symptoms   If you were to spend the rest of your life with your urinary condition just the way it is now how would you feel about that? Mixed            Score:  1-7 Mild 8-19 Moderate 20-35 Severe  PMH: Past Medical History:  Diagnosis Date   Allergic rhinitis, cause unspecified 01/31/2013   ANEMIA-NOS 01/28/2010   BPH (benign prostatic hypertrophy) 04/25/2013   CAD 02/17/2010   CAROTID BRUIT 11/18/2009   Carpal tunnel syndrome 05/20/2008   Cervical radiculitis 04/17/2012   CHEST PAIN 09/26/2009   DEPRESSION 06/12/2007   DYSPNEA 07/28/2010   ERECTILE DYSFUNCTION 02/24/2009   GERD 06/09/2007   GERD (gastroesophageal reflux disease)    GLUCOSE INTOLERANCE 01/28/2010   Gout 2017   Dr.John   HEMORRHOIDS 06/09/2007   History of colonic polyps 08/07/2015   HYPERLIPIDEMIA 02/07/2008   HYPERTENSION 06/09/2007   HYPOKALEMIA 01/28/2010   Impaired glucose tolerance 01/23/2011   LOW BACK PAIN 06/12/2007   MYOCARDIAL PERFUSION SCAN, WITH STRESS TEST, ABNORMAL 10/08/2009   PARESTHESIA 04/25/2008   SHOULDER PAIN, LEFT 04/25/2008   URI 08/19/2008   VITAMIN B12 DEFICIENCY 02/09/2010    Surgical History: Past Surgical History:  Procedure Laterality Date    COLONOSCOPY     CORONARY STENT PLACEMENT     from right radial    Home Medications:  Allergies as of 12/30/2022       Reactions   Diclofenac Sodium    REACTION: maks pt drowsey        Medication List        Accurate as of December 30, 2022 12:11 PM. If you have any questions, ask your nurse or doctor.          amLODipine 5 MG tablet Commonly known as: NORVASC Take 1 tablet by mouth once daily   aspirin EC 81 MG tablet Take 81 mg by mouth daily.   carvedilol 25 MG tablet Commonly known as: COREG TAKE 1 TABLET BY MOUTH TWICE DAILY WITH A MEAL   diclofenac sodium 1 % Gel Commonly known as: VOLTAREN Apply 2 g topically 4 (four) times daily as needed.   doxycycline 100 MG tablet Commonly known as: VIBRA-TABS Take 1 tablet (100 mg total) by mouth 2 (two) times daily.   fexofenadine 180 MG tablet Commonly known as: ALLEGRA Take 1 tablet (180 mg total) by mouth daily. As needed   fluticasone 50 MCG/ACT nasal spray Commonly known as: FLONASE USE TWO SPRAYS IN EACH NOSTRIL ONCE DAILY   furosemide 20 MG tablet Commonly known as: Lasix Take one tablet (20mg ) by mouth daily this week - starting next week take lasix 20mg  three times a week.   gabapentin 100 MG capsule Commonly known as: NEURONTIN TAKE 2 CAPSULES BY MOUTH THREE TIMES DAILY   hydrocortisone 2.5 % rectal cream Commonly known as: Anusol-HC Place 1 Application rectally 2 (two) times daily.   lisinopril-hydrochlorothiazide 20-12.5 MG tablet Commonly known as: ZESTORETIC Take 1 tablet by mouth daily.   meclizine 12.5 MG tablet Commonly known as: ANTIVERT Take 1 tablet (12.5 mg total) by mouth 3 (three) times daily as needed for dizziness.   meloxicam 7.5 MG tablet Commonly known as: MOBIC TAKE 1 TABLET BY MOUTH ONCE DAILY AS NEEDED FOR PAIN   Mitigare 0.6 MG Caps Generic drug: Colchicine Take 1 capsule by mouth once daily   potassium chloride 10 MEQ tablet Commonly known as: KLOR-CON TAKE 2  TABLETS BY MOUTH ONCE DAILY TAKE  AN  EXTRA  TABLET  EVERY  OTHER  DAY  WHEN  YOU  TAKE  YOUR  FUROSEMIDE  (LASIX)   predniSONE 10 MG tablet Commonly known as: DELTASONE 2 tabs by mouth per day for 5 days   rosuvastatin 10 MG tablet Commonly known as: CRESTOR TAKE 1 TABLET  BY MOUTH ONCE DAILY. ANNUAL APPOINTMENT IS DUE. MUST SEE PROVIDER FOR FURTHER REFILLS.   tamsulosin 0.4 MG Caps capsule Commonly known as: FLOMAX Take 1 capsule (0.4 mg total) by mouth daily.        Allergies:  Allergies  Allergen Reactions   Diclofenac Sodium     REACTION: maks pt drowsey    Family History: Family History  Problem Relation Age of Onset   Hyperlipidemia Mother    Heart disease Mother    Hypertension Mother    Hyperlipidemia Father    Heart disease Father    Hypertension Father    Alcohol abuse Sister        ETOH dependence   Alcohol abuse Brother        ETOH  dependence   Heart disease Brother    Heart attack Other    Stroke Other    Hypertension Other    Coronary artery disease Other     Social History:  reports that he has never smoked. He has never used smokeless tobacco. He reports that he does not drink alcohol and does not use drugs.  Physical Exam: BP (!) 166/83   Pulse 70   Ht 5\' 8"  (1.727 m)   Wt 197 lb (89.4 kg)   BMI 29.95 kg/m   Constitutional:  Alert and oriented, No acute distress. HEENT: [place] AT, moist mucus membranes.  Trachea midline, no masses. GU: Small prostate, but only able to feel the apex based on habitus and positioning. Mild tenderness in left epididymis, but not enlarged and no skin changes. Bilateral descended testicles. No inguinal hernias.  Uncircumcised phallus with redundant foreskin, retractable. Rectal: Normal sphincter tone.  Unable to palpate the apex of the prostate which is normal, suspect prostate is relatively small. Neurologic: Grossly intact, no focal deficits, moving all 4 extremities. Psychiatric: Normal mood and  affect.  Urinalysis    Component Value Date/Time   COLORURINE YELLOW 10/27/2022 1134   APPEARANCEUR Clear 12/30/2022 1027   LABSPEC 1.025 10/27/2022 1134   PHURINE 6.0 10/27/2022 1134   GLUCOSEU Negative 12/30/2022 1027   GLUCOSEU NEGATIVE 10/27/2022 1134   HGBUR NEGATIVE 10/27/2022 1134   BILIRUBINUR Negative 12/30/2022 1027   KETONESUR NEGATIVE 10/27/2022 1134   PROTEINUR Trace (A) 12/30/2022 1027   PROTEINUR NEGATIVE 08/21/2018 0952   UROBILINOGEN 0.2 10/27/2022 1134   NITRITE Negative 12/30/2022 1027   NITRITE NEGATIVE 10/27/2022 1134   LEUKOCYTESUR Trace (A) 12/30/2022 1027   LEUKOCYTESUR NEGATIVE 10/27/2022 1134    Lab Results  Component Value Date   LABMICR See below: 12/30/2022   WBCUA 6-10 (A) 12/30/2022   LABEPIT 0-10 12/30/2022   MUCUS Present (A) 12/30/2022   BACTERIA Moderate (A) 12/30/2022    Assessment & Plan:    Left lower quadrant pain  - Get a KUB today to rule out stones, although it is less likely to be the cause due to his lack of history for them.  -We related to chronic constipation  2. Pyuria  - He has a few white blood cells in his urine, but otherwise no signs of infection on urinalysis.  - He has a pretty tight foreskin which may be the reason of the white blood cells.  3. BPH with urinary frequency nocturia  - Likely multifactorial.  - He's emptying his bladder well.  - Urged him to go back on Flomax on a regular basis.  - Plan for cysto to evaluate his prostate and bladder anatomy and guide for further management.  Return if symptoms worsen or fail to improve.  I have reviewed the above documentation for accuracy and completeness, and I agree with the above.   Hollice Espy, MD   [facility] [address], [place] [address] [place], [place] [address] [phone]

## 2023-01-06 ENCOUNTER — Telehealth: Payer: Self-pay

## 2023-01-06 NOTE — Telephone Encounter (Signed)
Contacted Travis Carey to schedule their annual wellness visit. Appointment made for 01/12/23.  Norton Blizzard, [place] ([facility])  [place] [facility] [phone]

## 2023-01-07 ENCOUNTER — Other Ambulatory Visit: Payer: Self-pay | Admitting: Internal Medicine

## 2023-01-12 ENCOUNTER — Ambulatory Visit: Payer: Medicare HMO

## 2023-01-13 ENCOUNTER — Ambulatory Visit (INDEPENDENT_AMBULATORY_CARE_PROVIDER_SITE_OTHER): Payer: Medicare HMO

## 2023-01-13 VITALS — Ht 68.0 in | Wt 197.0 lb

## 2023-01-13 DIAGNOSIS — Z0000 Encounter for general adult medical examination without abnormal findings: Secondary | ICD-10-CM | POA: Diagnosis not present

## 2023-01-13 NOTE — Progress Notes (Signed)
I connected with  [name] on 01/13/23 by a audio enabled telemedicine application and verified that I am speaking with the correct person using two identifiers.  Patient Location: Home  Provider Location: Office/Clinic  I discussed the limitations of evaluation and management by telemedicine. The patient expressed understanding and agreed to proceed.  Subjective:   Travis Carey is a 82 y.o. male who presents for Medicare Annual/Subsequent preventive examination.  Review of Systems     Cardiac Risk Factors include: advanced age (>4men, >67 women)     Objective:    Today's Vitals   01/13/23 1403  Weight: 197 lb (89.4 kg)  Height: 5\' 8"  (1.727 m)  PainSc: 0-No pain   Body mass index is 29.95 kg/m.     01/13/2023    2:05 PM 12/04/2021    3:15 PM 08/21/2018    9:38 AM 08/21/2018    9:35 AM 07/22/2018   12:15 PM 10/12/2016    1:12 PM 03/17/2016    8:05 AM  Advanced Directives  Does Patient Have a Medical Advance Directive? No No No No No No No  Would patient like information on creating a medical advance directive? No - Patient declined No - Patient declined No - Patient declined  No - Patient declined      Current Medications (verified) Outpatient Encounter Medications as of 01/13/2023  Medication Sig   amLODipine (NORVASC) 5 MG tablet Take 1 tablet by mouth once daily   aspirin EC 81 MG tablet Take 81 mg by mouth daily.   carvedilol (COREG) 25 MG tablet TAKE 1 TABLET BY MOUTH TWICE DAILY WITH A MEAL   diclofenac sodium (VOLTAREN) 1 % GEL Apply 2 g topically 4 (four) times daily as needed.   doxycycline (VIBRA-TABS) 100 MG tablet Take 1 tablet (100 mg total) by mouth 2 (two) times daily. (Patient not taking: Reported on 08/16/2022)   fexofenadine (ALLEGRA) 180 MG tablet Take 1 tablet (180 mg total) by mouth daily. As needed   fluticasone (FLONASE) 50 MCG/ACT nasal spray USE TWO SPRAYS IN EACH NOSTRIL ONCE DAILY   furosemide (LASIX) 20 MG tablet Take one tablet (20mg ) by  mouth daily this week - starting next week take lasix 20mg  three times a week.   gabapentin (NEURONTIN) 100 MG capsule TAKE 2 CAPSULES BY MOUTH THREE TIMES DAILY   hydrocortisone (ANUSOL-HC) 2.5 % rectal cream Place 1 Application rectally 2 (two) times daily.   lisinopril-hydrochlorothiazide (ZESTORETIC) 20-12.5 MG tablet Take 1 tablet by mouth daily.   meclizine (ANTIVERT) 12.5 MG tablet Take 1 tablet (12.5 mg total) by mouth 3 (three) times daily as needed for dizziness.   meloxicam (MOBIC) 7.5 MG tablet TAKE 1 TABLET BY MOUTH ONCE DAILY AS NEEDED FOR PAIN (Patient not taking: Reported on 08/16/2022)   MITIGARE 0.6 MG CAPS Take 1 capsule by mouth once daily   potassium chloride (KLOR-CON) 10 MEQ tablet TAKE 2 TABLETS BY MOUTH ONCE DAILY TAKE  AN  EXTRA  TABLET  EVERY  OTHER  DAY  WHEN  YOU  TAKE  YOUR  FUROSEMIDE  (LASIX)   predniSONE (DELTASONE) 10 MG tablet 2 tabs by mouth per day for 5 days (Patient not taking: Reported on 08/16/2022)   rosuvastatin (CRESTOR) 10 MG tablet TAKE 1 TABLET BY MOUTH ONCE DAILY.   tamsulosin (FLOMAX) 0.4 MG CAPS capsule Take 1 capsule (0.4 mg total) by mouth daily.   No facility-administered encounter medications on file as of 01/13/2023.    Allergies (verified) Diclofenac  sodium   History: Past Medical History:  Diagnosis Date   Allergic rhinitis, cause unspecified 01/31/2013   ANEMIA-NOS 01/28/2010   BPH (benign prostatic hypertrophy) 04/25/2013   CAD 02/17/2010   CAROTID BRUIT 11/18/2009   Carpal tunnel syndrome 05/20/2008   Cervical radiculitis 04/17/2012   CHEST PAIN 09/26/2009   DEPRESSION 06/12/2007   DYSPNEA 07/28/2010   ERECTILE DYSFUNCTION 02/24/2009   GERD 06/09/2007   GERD (gastroesophageal reflux disease)    GLUCOSE INTOLERANCE 01/28/2010   Gout 2017   Dr.John   HEMORRHOIDS 06/09/2007   History of colonic polyps 08/07/2015   HYPERLIPIDEMIA 02/07/2008   HYPERTENSION 06/09/2007   HYPOKALEMIA 01/28/2010   Impaired glucose tolerance 01/23/2011   LOW BACK  PAIN 06/12/2007   MYOCARDIAL PERFUSION SCAN, WITH STRESS TEST, ABNORMAL 10/08/2009   PARESTHESIA 04/25/2008   SHOULDER PAIN, LEFT 04/25/2008   URI 08/19/2008   VITAMIN B12 DEFICIENCY 02/09/2010   Past Surgical History:  Procedure Laterality Date   COLONOSCOPY     CORONARY STENT PLACEMENT     from right radial   Family History  Problem Relation Age of Onset   Hyperlipidemia Mother    Heart disease Mother    Hypertension Mother    Hyperlipidemia Father    Heart disease Father    Hypertension Father    Alcohol abuse Sister        ETOH dependence   Alcohol abuse Brother        ETOH  dependence   Heart disease Brother    Heart attack Other    Stroke Other    Hypertension Other    Coronary artery disease Other    Social History   Socioeconomic History   Marital status: Widowed    Spouse name: Not on file   Number of children: 5   Years of education: Not on file   Highest education level: Not on file  Occupational History   Occupation: Dealer   Occupation: minister  Tobacco Use   Smoking status: Never   Smokeless tobacco: Never  Vaping Use   Vaping Use: Never used  Substance and Sexual Activity   Alcohol use: No   Drug use: No   Sexual activity: Not on file  Other Topics Concern   Not on file  Social History Narrative   Lives in [place]. 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      Wife died 2007/12/25   Social Determinants of Health   Financial Resource Strain: Low Risk  (01/13/2023)   Overall Financial Resource Strain (CARDIA)    Difficulty of Paying Living Expenses: Not hard at all  Food Insecurity: No Food Insecurity (01/13/2023)   Hunger Vital Sign    Worried About Running Out of Food in the Last Year: Never true    Ran Out of Food in the Last Year: Never true  Transportation Needs: No Transportation Needs (01/13/2023)   PRAPARE - Hydrologist (Medical): No    Lack of Transportation (Non-Medical): No  Physical Activity: Sufficiently Active  (01/13/2023)   Exercise Vital Sign    Days of Exercise per Week: 5 days    Minutes of Exercise per Session: 30 min  Stress: No Stress Concern Present (01/13/2023)   [place]    Feeling of Stress : Not at all  Social Connections: [place] (01/13/2023)   Social Connection and Isolation Panel [NHANES]    Frequency of Communication with Friends and Family: More than three times  a week    Frequency of Social Gatherings with Friends and Family: More than three times a week    Attends Religious Services: More than 4 times per year    Active Member of [employer] or Organizations: Yes    Attends Music therapist: More than 4 times per year    Marital Status: Married    Tobacco Counseling Counseling given: Not Answered   Clinical Intake:  Pre-visit preparation completed: Yes  Pain : No/denies pain Pain Score: 0-No pain     BMI - recorded: 29.95 Nutritional Status: BMI 25 -29 Overweight Nutritional Risks: None Diabetes: No CBG done?: No Did pt. bring in CBG monitor from home?: No  How often do you need to have someone help you when you read instructions, pamphlets, or other written materials from your doctor or pharmacy?: 1 - Never What is the last grade level you completed in school?: 9th grade  Diabetic? No  Interpreter Needed?: No  Information entered by :: Lisette Abu, LPN.   Activities of Daily Living    01/13/2023    2:10 PM  In your present state of health, do you have any difficulty performing the following activities:  Hearing? 0  Vision? 0  Difficulty concentrating or making decisions? 0  Walking or climbing stairs? 0  Dressing or bathing? 0  Doing errands, shopping? 0  Preparing Food and eating ? N  Using the Toilet? N  In the past six months, have you accidently leaked urine? N  Do you have problems with loss of bowel control? N  Managing your Medications? N  Managing  your Finances? N  Housekeeping or managing your Housekeeping? N    Patient Care Team: Biagio Borg, MD as PCP - General (Internal Medicine) Minna Merritts, MD as Consulting Physician (Cardiology) Lorelee Cover., MD as Consulting Physician (Ophthalmology)  Indicate any recent Medical Services you may have received from other than [facility] providers in the past year (date may be approximate).     Assessment:   This is a routine wellness examination for Alic.  Hearing/Vision screen Hearing Screening - Comments:: Denies hearing difficulties   Vision Screening - Comments:: Wears rx glasses - up to date with routine eye exams with Dr. Lorie Apley [facility], [place])   Dietary issues and exercise activities discussed: Current Exercise Habits: Home exercise routine, Type of exercise: treadmill;walking, Time (Minutes): 30, Frequency (Times/Week): 5, Weekly Exercise (Minutes/Week): 150, Intensity: Moderate, Exercise limited by: respiratory conditions(s)   Goals Addressed             This Visit's Progress    My goal for 2024 is to do more exercising.        Depression Screen    01/13/2023    2:08 PM 10/27/2022   10:57 AM 10/27/2022   10:27 AM 04/26/2022   10:48 AM 10/27/2021   11:48 AM 04/27/2021   12:16 PM 04/27/2021   11:35 AM  PHQ 2/9 Scores  PHQ - 2 Score 0 0 0 0 0 0 0  PHQ- 9 Score 0   0   3    Fall Risk    01/13/2023    2:05 PM 10/27/2022   10:57 AM 10/27/2022   10:27 AM 04/26/2022   10:48 AM 10/27/2021   11:48 AM  Fall Risk   Falls in the past year? 0 0 0 0 0  Number falls in past yr: 0 0 0 0 0  Injury with Fall? 0 0 0 0 0  Risk for fall due to : No Fall Risks  No Fall Risks    Follow up Falls prevention discussed  Falls evaluation completed      [place]:  Any stairs in or around the home? No  If so, are there any without handrails? No  Home free of loose throw rugs in walkways, pet beds, electrical cords, etc? Yes  Adequate  lighting in your home to reduce risk of falls? Yes   ASSISTIVE DEVICES UTILIZED TO PREVENT FALLS:  Life alert? No  Use of a cane, walker or w/c? No  Grab bars in the bathroom? No  Shower chair or bench in shower? No  Elevated toilet seat or a handicapped toilet? No   TIMED UP AND GO:  Was the test performed? No . Telephone Visit   Cognitive Function:        01/13/2023    2:10 PM  6CIT Screen  What Year? 0 points  What month? 0 points  What time? 0 points  Count back from 20 0 points  Months in reverse 0 points  Repeat phrase 0 points  Total Score 0 points    Immunizations Immunization History  Administered Date(s) Administered   Fluad Quad(high Dose 65+) 10/29/2022   Influenza Split 07/27/2011   Influenza Whole 08/09/1997, 08/28/2009, 07/28/2010   Influenza, High Dose Seasonal PF 08/11/2017, 08/17/2018, 07/23/2019, 07/23/2019   Influenza,inj,Quad PF,6+ Mos 06/12/2014, 08/07/2015   Influenza-Unspecified 06/11/2013, 06/12/2014, 08/07/2015, 07/13/2021   PFIZER Comirnaty(Gray Top)Covid-19 Tri-Sucrose Vaccine 02/05/2020, 02/26/2020   PFIZER(Purple Top)SARS-COV-2 Vaccination 01/30/2020, 10/01/2020, 04/22/2022   Pneumococcal Conjugate-13 11/01/2013   Pneumococcal Polysaccharide-23 08/19/2008   Td 08/19/2008   Tdap 12/07/2018   Zoster Recombinat (Shingrix) 04/20/2022, 10/29/2022   Zoster, Live 01/31/2013    TDAP status: Up to date  Flu Vaccine status: Up to date  Pneumococcal vaccine status: Up to date  Covid-19 vaccine status: Completed vaccines  Qualifies for Shingles Vaccine? Yes   Zostavax completed Yes   Shingrix Completed?: Yes  Screening Tests Health Maintenance  Topic Date Due   COVID-19 Vaccine (6 - 2023-24 season) 06/17/2022   COLONOSCOPY (Pts 45-22yrs Insurance coverage will need to be confirmed)  10/29/2023 (Originally 10/11/2017)   HEMOGLOBIN A1C  04/27/2023   INFLUENZA VACCINE  05/12/2023   OPHTHALMOLOGY EXAM  06/29/2023   Diabetic kidney  evaluation - eGFR measurement  10/28/2023   Diabetic kidney evaluation - Urine ACR  10/28/2023   FOOT EXAM  10/28/2023   Medicare Annual Wellness (AWV)  01/13/2024   DTaP/Tdap/Td (3 - Td or Tdap) 12/07/2028   Pneumonia Vaccine 61+ Years old  Completed   Zoster Vaccines- Shingrix  Completed   HPV VACCINES  Aged Out    Health Maintenance  Health Maintenance Due  Topic Date Due   COVID-19 Vaccine (6 - 2023-24 season) 06/17/2022    Colorectal cancer screening: No longer required.   Lung Cancer Screening: (Low Dose CT Chest recommended if Age 34-80 years, 30 pack-year currently smoking OR have quit w/in 15years.) does not qualify.   Lung Cancer Screening Referral: NO  Additional Screening:  Hepatitis C Screening: does not qualify; Completed: No  Vision Screening: Recommended annual ophthalmology exams for early detection of glaucoma and other disorders of the eye. Is the patient up to date with their annual eye exam?  Yes  Who is the provider or what is the name of the office in which the patient attends annual eye exams? Lorie Apley, MD. If pt is not  established with a provider, would they like to be referred to a provider to establish care? No .   Dental Screening: Recommended annual dental exams for proper oral hygiene  Community Resource Referral / Chronic Care Management: CRR required this visit?  No   CCM required this visit?  No      Plan:     I have personally reviewed and noted the following in the patient's chart:   Medical and social history Use of alcohol, tobacco or illicit drugs  Current medications and supplements including opioid prescriptions. Patient is not currently taking opioid prescriptions. Functional ability and status Nutritional status Physical activity Advanced directives List of other physicians Hospitalizations, surgeries, and ER visits in previous 12 months Vitals Screenings to include cognitive, depression, and falls Referrals and  appointments  In addition, I have reviewed and discussed with patient certain preventive protocols, quality metrics, and best practice recommendations. A written personalized care plan for preventive services as well as general preventive health recommendations were provided to patient.     Sheral Flow, LPN   [id number]   Nurse Notes:  Normal cognitive status assessed by direct observation via phone by this Nurse Health Advisor. No abnormalities found.

## 2023-01-13 NOTE — Patient Instructions (Signed)
Travis Carey , Thank you for taking time to come for your Medicare Wellness Visit. I appreciate your ongoing commitment to your health goals. Please review the following plan we discussed and let me know if I can assist you in the future.   These are the goals we discussed:  Goals      My goal for 2024 is to do more exercising.        This is a list of the screening recommended for you and due dates:  Health Maintenance  Topic Date Due   COVID-19 Vaccine (6 - 2023-24 season) 06/17/2022   Colon Cancer Screening  10/29/2023*   Hemoglobin A1C  04/27/2023   Flu Shot  05/12/2023   Eye exam for diabetics  06/29/2023   Yearly kidney function blood test for diabetes  10/28/2023   Yearly kidney health urinalysis for diabetes  10/28/2023   Complete foot exam   10/28/2023   Medicare Annual Wellness Visit  01/13/2024   DTaP/Tdap/Td vaccine (3 - Td or Tdap) 12/07/2028   Pneumonia Vaccine  Completed   Zoster (Shingles) Vaccine  Completed   HPV Vaccine  Aged Out  *Topic was postponed. The date shown is not the original due date.    Advanced directives: No; Advance directive discussed with you today. Even though you declined this today please call our office should you change your mind and we can give you the proper paperwork for you to fill out.  Conditions/risks identified: Yes  Next appointment: Follow up in one year for your annual wellness visit.   Preventive Care 92 Years and Older, Male  Preventive care refers to lifestyle choices and visits with your health care provider that can promote health and wellness. What does preventive care include? A yearly physical exam. This is also called an annual well check. Dental exams once or twice a year. Routine eye exams. Ask your health care provider how often you should have your eyes checked. Personal lifestyle choices, including: Daily care of your teeth and gums. Regular physical activity. Eating a healthy diet. Avoiding tobacco and drug  use. Limiting alcohol use. Practicing safe sex. Taking low doses of aspirin every day. Taking vitamin and mineral supplements as recommended by your health care provider. What happens during an annual well check? The services and screenings done by your health care provider during your annual well check will depend on your age, overall health, lifestyle risk factors, and family history of disease. Counseling  Your health care provider may ask you questions about your: Alcohol use. Tobacco use. Drug use. Emotional well-being. Home and relationship well-being. Sexual activity. Eating habits. History of falls. Memory and ability to understand (cognition). Work and work Statistician. Screening  You may have the following tests or measurements: Height, weight, and BMI. Blood pressure. Lipid and cholesterol levels. These may be checked every 5 years, or more frequently if you are over 83 years old. Skin check. Lung cancer screening. You may have this screening every year starting at age 18 if you have a 30-pack-year history of smoking and currently smoke or have quit within the past 15 years. Fecal occult blood test (FOBT) of the stool. You may have this test every year starting at age 70. Flexible sigmoidoscopy or colonoscopy. You may have a sigmoidoscopy every 5 years or a colonoscopy every 10 years starting at age 51. Prostate cancer screening. Recommendations will vary depending on your family history and other risks. Hepatitis C blood test. Hepatitis B blood test. Sexually transmitted  disease (STD) testing. Diabetes screening. This is done by checking your blood sugar (glucose) after you have not eaten for a while (fasting). You may have this done every 1-3 years. Abdominal aortic aneurysm (AAA) screening. You may need this if you are a current or former smoker. Osteoporosis. You may be screened starting at age 29 if you are at high risk. Talk with your health care provider about  your test results, treatment options, and if necessary, the need for more tests. Vaccines  Your health care provider may recommend certain vaccines, such as: Influenza vaccine. This is recommended every year. Tetanus, diphtheria, and acellular pertussis (Tdap, Td) vaccine. You may need a Td booster every 10 years. Zoster vaccine. You may need this after age 34. Pneumococcal 13-valent conjugate (PCV13) vaccine. One dose is recommended after age 34. Pneumococcal polysaccharide (PPSV23) vaccine. One dose is recommended after age 40. Talk to your health care provider about which screenings and vaccines you need and how often you need them. This information is not intended to replace advice given to you by your health care provider. Make sure you discuss any questions you have with your health care provider. Document Released: 10/24/2015 Document Revised: 06/16/2016 Document Reviewed: 07/29/2015 Elsevier Interactive Patient Education  2017 [place] Prevention in the Home Falls can cause injuries. They can happen to people of all ages. There are many things you can do to make your home safe and to help prevent falls. What can I do on the outside of my home? Regularly fix the edges of walkways and driveways and fix any cracks. Remove anything that might make you trip as you walk through a door, such as a raised step or threshold. Trim any bushes or trees on the path to your home. Use bright outdoor lighting. Clear any walking paths of anything that might make someone trip, such as rocks or tools. Regularly check to see if handrails are loose or broken. Make sure that both sides of any steps have handrails. Any raised decks and porches should have guardrails on the edges. Have any leaves, snow, or ice cleared regularly. Use sand or salt on walking paths during winter. Clean up any spills in your garage right away. This includes oil or grease spills. What can I do in the bathroom? Use  night lights. Install grab bars by the toilet and in the tub and shower. Do not use towel bars as grab bars. Use non-skid mats or decals in the tub or shower. If you need to sit down in the shower, use a plastic, non-slip stool. Keep the floor dry. Clean up any water that spills on the floor as soon as it happens. Remove soap buildup in the tub or shower regularly. Attach bath mats securely with double-sided non-slip rug tape. Do not have throw rugs and other things on the floor that can make you trip. What can I do in the bedroom? Use night lights. Make sure that you have a light by your bed that is easy to reach. Do not use any sheets or blankets that are too big for your bed. They should not hang down onto the floor. Have a firm chair that has side arms. You can use this for support while you get dressed. Do not have throw rugs and other things on the floor that can make you trip. What can I do in the kitchen? Clean up any spills right away. Avoid walking on wet floors. Keep items that you use a  lot in easy-to-reach places. If you need to reach something above you, use a strong step stool that has a grab bar. Keep electrical cords out of the way. Do not use floor polish or wax that makes floors slippery. If you must use wax, use non-skid floor wax. Do not have throw rugs and other things on the floor that can make you trip. What can I do with my stairs? Do not leave any items on the stairs. Make sure that there are handrails on both sides of the stairs and use them. Fix handrails that are broken or loose. Make sure that handrails are as long as the stairways. Check any carpeting to make sure that it is firmly attached to the stairs. Fix any carpet that is loose or worn. Avoid having throw rugs at the top or bottom of the stairs. If you do have throw rugs, attach them to the floor with carpet tape. Make sure that you have a light switch at the top of the stairs and the bottom of the  stairs. If you do not have them, ask someone to add them for you. What else can I do to help prevent falls? Wear shoes that: Do not have high heels. Have rubber bottoms. Are comfortable and fit you well. Are closed at the toe. Do not wear sandals. If you use a stepladder: Make sure that it is fully opened. Do not climb a closed stepladder. Make sure that both sides of the stepladder are locked into place. Ask someone to hold it for you, if possible. Clearly mark and make sure that you can see: Any grab bars or handrails. First and last steps. Where the edge of each step is. Use tools that help you move around (mobility aids) if they are needed. These include: Canes. Walkers. Scooters. Crutches. Turn on the lights when you go into a dark area. Replace any light bulbs as soon as they burn out. Set up your furniture so you have a clear path. Avoid moving your furniture around. If any of your floors are uneven, fix them. If there are any pets around you, be aware of where they are. Review your medicines with your doctor. Some medicines can make you feel dizzy. This can increase your chance of falling. Ask your doctor what other things that you can do to help prevent falls. This information is not intended to replace advice given to you by your health care provider. Make sure you discuss any questions you have with your health care provider. Document Released: 07/24/2009 Document Revised: 03/04/2016 Document Reviewed: 11/01/2014 Elsevier Interactive Patient Education  2017 [employer].

## 2023-01-18 ENCOUNTER — Other Ambulatory Visit: Payer: Self-pay | Admitting: Internal Medicine

## 2023-01-20 ENCOUNTER — Ambulatory Visit: Payer: Medicare HMO | Admitting: Urology

## 2023-01-20 VITALS — BP 148/74 | HR 80 | Ht 68.0 in | Wt 197.0 lb

## 2023-01-20 DIAGNOSIS — R351 Nocturia: Secondary | ICD-10-CM

## 2023-01-20 DIAGNOSIS — N401 Enlarged prostate with lower urinary tract symptoms: Secondary | ICD-10-CM

## 2023-01-20 DIAGNOSIS — R1032 Left lower quadrant pain: Secondary | ICD-10-CM

## 2023-01-20 LAB — URINALYSIS, COMPLETE
Bilirubin, UA: NEGATIVE
Glucose, UA: NEGATIVE
Ketones, UA: NEGATIVE
Leukocytes,UA: NEGATIVE
Nitrite, UA: NEGATIVE
Protein,UA: NEGATIVE
Specific Gravity, UA: 1.015 (ref 1.005–1.030)
Urobilinogen, Ur: 0.2 mg/dL (ref 0.2–1.0)
pH, UA: 6 (ref 5.0–7.5)

## 2023-01-20 LAB — MICROSCOPIC EXAMINATION

## 2023-01-20 NOTE — Progress Notes (Signed)
   01/20/23  CC:  Chief Complaint  Patient presents with   Cysto    HPI: 82 year old male with worsening urinary symptoms presents today for cystoscopy.  Incidentally does have some microscopic blood in his urine today, minimal.  He started back taking his Flomax and his urinary symptoms are now dramatically improved.  He continues to have some mild left lower quadrant pain of unclear etiology.  Blood pressure (!) 165/74, pulse 69, height 5\' 8"  (1.727 m), weight 197 lb (89.4 kg). NED. A&Ox3.   No respiratory distress   Abd soft, NT, ND Normal phallus with bilateral descended testicles  Cystoscopy Procedure Note  Patient identification was confirmed, informed consent was obtained, and patient was prepped using Betadine solution.  Lidocaine jelly was administered per urethral meatus.     Pre-Procedure: - Inspection reveals a normal caliber ureteral meatus.  Procedure: The flexible cystoscope was introduced without difficulty - No urethral strictures/lesions are present. - Enlarged prostate mild bilobar coaptation, 5 cm prostatic length - Normal bladder neck - Bilateral ureteral orifices identified - Bladder mucosa  reveals no ulcers, tumors, or lesions - No bladder stones - No trabeculation  Retroflexion shows unremarkable   Post-Procedure: - Patient tolerated the procedure well  Assessment/ Plan:  1. Nocturia Drinking less before bedtime and back on Flomax, urinary symptoms are improved  Cystoscopy today was fairly unremarkable - Urinalysis, Complete  2. Benign prostatic hyperplasia with lower urinary tract symptoms, symptom details unspecified As above - Urinalysis, Complete  3. Left lower quadrant abdominal pain Unlikely GU in origin  4.  Microscopic hematuria Today, likely from his foreskin  Cystoscopy is unremarkable  Repeat UA in a year, consider upper tract imaging if persist   F/u 1 year IPSS/ PVR  Vanna Scotland, MD

## 2023-02-17 ENCOUNTER — Encounter: Payer: Self-pay | Admitting: Gastroenterology

## 2023-02-17 ENCOUNTER — Telehealth: Payer: Self-pay | Admitting: Internal Medicine

## 2023-02-17 ENCOUNTER — Ambulatory Visit (INDEPENDENT_AMBULATORY_CARE_PROVIDER_SITE_OTHER): Payer: Medicare HMO | Admitting: Gastroenterology

## 2023-02-17 ENCOUNTER — Other Ambulatory Visit: Payer: Self-pay

## 2023-02-17 VITALS — BP 138/76 | HR 65 | Temp 97.6°F | Ht 68.0 in | Wt 203.0 lb

## 2023-02-17 DIAGNOSIS — K5904 Chronic idiopathic constipation: Secondary | ICD-10-CM | POA: Diagnosis not present

## 2023-02-17 DIAGNOSIS — K625 Hemorrhage of anus and rectum: Secondary | ICD-10-CM | POA: Diagnosis not present

## 2023-02-17 DIAGNOSIS — E119 Type 2 diabetes mellitus without complications: Secondary | ICD-10-CM | POA: Diagnosis not present

## 2023-02-17 DIAGNOSIS — J449 Chronic obstructive pulmonary disease, unspecified: Secondary | ICD-10-CM | POA: Diagnosis not present

## 2023-02-17 MED ORDER — NA SULFATE-K SULFATE-MG SULF 17.5-3.13-1.6 GM/177ML PO SOLN: 354.0000 mL | Freq: Once | ORAL | 0 refills | Status: AC

## 2023-02-17 NOTE — Patient Instructions (Signed)
Take Miralax 17grams to 34 grams daily

## 2023-02-17 NOTE — Progress Notes (Signed)
Arlyss Repress, MD [address]  [address]  [place], [place] [address]  Main: [phone]  Fax: [phone]    Gastroenterology Consultation  Referring Provider:     Corwin Levins, MD Primary Care Physician:  Corwin Levins, MD Primary Gastroenterologist:  Dr. Arlyss Repress Reason for Consultation: Rectal bleeding        HPI:   Travis Carey is a 83 y.o. male referred by Dr. Corwin Levins, MD  for consultation & management of rectal bleeding.  Patient reports that for last 2 days he has been noticing bright red blood per rectum.  Prior to this, it happened in January.  He does report chronic constipation.  Labs revealed mild anemia, normal iron levels.  He denies any abdominal pain, nausea or vomiting, abdominal bloating. He is functionally independent, fairly healthy  NSAIDs: None  Antiplts/Anticoagulants/Anti thrombotics: None  GI Procedures: Colonoscopy more than 10 years ago  Past Medical History:  Diagnosis Date   Allergic rhinitis, cause unspecified 01/31/2013   ANEMIA-NOS 01/28/2010   BPH (benign prostatic hypertrophy) 04/25/2013   CAD 02/17/2010   CAROTID BRUIT 11/18/2009   Carpal tunnel syndrome 05/20/2008   Cervical radiculitis 04/17/2012   CHEST PAIN 09/26/2009   DEPRESSION 06/12/2007   DYSPNEA 07/28/2010   ERECTILE DYSFUNCTION 02/24/2009   GERD 06/09/2007   GERD (gastroesophageal reflux disease)    GLUCOSE INTOLERANCE 01/28/2010   Gout 2017   Dr.John   HEMORRHOIDS 06/09/2007   History of colonic polyps 08/07/2015   HYPERLIPIDEMIA 02/07/2008   HYPERTENSION 06/09/2007   HYPOKALEMIA 01/28/2010   Impaired glucose tolerance 01/23/2011   LOW BACK PAIN 06/12/2007   MYOCARDIAL PERFUSION SCAN, WITH STRESS TEST, ABNORMAL 10/08/2009   PARESTHESIA 04/25/2008   SHOULDER PAIN, LEFT 04/25/2008   URI 08/19/2008   VITAMIN B12 DEFICIENCY 02/09/2010    Past Surgical History:  Procedure Laterality Date   COLONOSCOPY     CORONARY STENT PLACEMENT     from right radial      Current Outpatient Medications:    amLODipine (NORVASC) 5 MG tablet, Take 1 tablet by mouth once daily, Disp: 90 tablet, Rfl: 0   aspirin EC 81 MG tablet, Take 81 mg by mouth daily., Disp: , Rfl:    carvedilol (COREG) 25 MG tablet, TAKE 1 TABLET BY MOUTH TWICE DAILY WITH A MEAL, Disp: 180 tablet, Rfl: 2   diclofenac sodium (VOLTAREN) 1 % GEL, Apply 2 g topically 4 (four) times daily as needed., Disp: 100 g, Rfl: 5   fexofenadine (ALLEGRA) 180 MG tablet, Take 1 tablet (180 mg total) by mouth daily. As needed, Disp: 90 tablet, Rfl: 3   fluticasone (FLONASE) 50 MCG/ACT nasal spray, USE TWO SPRAYS IN EACH NOSTRIL ONCE DAILY, Disp: 48 g, Rfl: 5   furosemide (LASIX) 20 MG tablet, Take one tablet (20mg ) by mouth daily this week - starting next week take lasix 20mg  three times a week., Disp: 90 tablet, Rfl: 3   hydrocortisone (ANUSOL-HC) 2.5 % rectal cream, Place 1 Application rectally 2 (two) times daily., Disp: 30 g, Rfl: 1   lisinopril-hydrochlorothiazide (ZESTORETIC) 20-12.5 MG tablet, Take 1 tablet by mouth daily., Disp: 90 tablet, Rfl: 3   meclizine (ANTIVERT) 12.5 MG tablet, Take 1 tablet (12.5 mg total) by mouth 3 (three) times daily as needed for dizziness., Disp: 30 tablet, Rfl: 1   meloxicam (MOBIC) 7.5 MG tablet, TAKE 1 TABLET BY MOUTH ONCE DAILY AS NEEDED FOR PAIN, Disp: 30 tablet, Rfl: 0  MITIGARE 0.6 MG CAPS, Take 1 capsule by mouth once daily, Disp: 90 capsule, Rfl: 0   Na Sulfate-K Sulfate-Mg Sulf 17.5-3.13-1.6 GM/177ML SOLN, Take 354 mLs by mouth once for 1 dose., Disp: 354 mL, Rfl: 0   potassium chloride (KLOR-CON) 10 MEQ tablet, TAKE 2 TABLETS BY MOUTH ONCE DAILY TAKE  AN  EXTRA  TABLET  EVERY  OTHER  DAY  WHEN  YOU  TAKE  YOUR  FUROSEMIDE  (LASIX), Disp: 180 tablet, Rfl: 1   rosuvastatin (CRESTOR) 10 MG tablet, TAKE 1 TABLET BY MOUTH ONCE DAILY., Disp: 90 tablet, Rfl: 2   tamsulosin (FLOMAX) 0.4 MG CAPS capsule, Take 1 capsule (0.4 mg total) by mouth daily., Disp: 90 capsule,  Rfl: 3   Family History  Problem Relation Age of Onset   Hyperlipidemia Mother    Heart disease Mother    Hypertension Mother    Hyperlipidemia Father    Heart disease Father    Hypertension Father    Alcohol abuse Sister        ETOH dependence   Alcohol abuse Brother        ETOH  dependence   Heart disease Brother    Heart attack Other    Stroke Other    Hypertension Other    Coronary artery disease Other      Social History   Tobacco Use   Smoking status: Never   Smokeless tobacco: Never  Vaping Use   Vaping Use: Never used  Substance Use Topics   Alcohol use: No   Drug use: No    Allergies as of 02/17/2023 - Review Complete 02/17/2023  Allergen Reaction Noted   Diclofenac sodium      Review of Systems:    All systems reviewed and negative except where noted in HPI.   Physical Exam:  BP 138/76 (BP Location: Right Arm, Patient Position: Sitting, Cuff Size: Normal)   Pulse 65   Temp 97.6 F (36.4 C) (Oral)   Ht 5\' 8"  (1.727 m)   Wt 203 lb (92.1 kg)   BMI 30.87 kg/m  No LMP for male patient.  General:   Alert,  Well-developed, well-nourished, pleasant and cooperative in NAD Head:  Normocephalic and atraumatic. Eyes:  Sclera clear, no icterus.   Conjunctiva pink. Ears:  Normal auditory acuity. Nose:  No deformity, discharge, or lesions. Mouth:  No deformity or lesions,oropharynx pink & moist. Neck:  Supple; no masses or thyromegaly. Lungs:  Respirations even and unlabored.  Clear throughout to auscultation.   No wheezes, crackles, or rhonchi. No acute distress. Heart:  Regular rate and rhythm; no murmurs, clicks, rubs, or gallops. Abdomen:  Normal bowel sounds. Soft, non-tender and non-distended without masses, hepatosplenomegaly or hernias noted.  No guarding or rebound tenderness.   Rectal: Not performed Msk:  Symmetrical without gross deformities. Good, equal movement & strength bilaterally. Pulses:  Normal pulses noted. Extremities:  No clubbing or  edema.  No cyanosis. Neurologic:  Alert and oriented x3;  grossly normal neurologically. Skin:  Intact without significant lesions or rashes. No jaundice. Psych:  Alert and cooperative. Normal mood and affect.  Imaging Studies: No abdominal imaging  Assessment and Plan:   Travis Carey is a 82 y.o. pleasant male with history of hypertension is seen in consultation for chronic constipation and rectal bleeding.  Patient is otherwise healthy, had a colonoscopy more than 10 years ago.  Discussed with him regarding diagnostic colonoscopy with 2-day prep to rule out any left-sided lesions, polyps, malignancy  and he is agreeable Discussed about high-fiber diet, avoid sugary drinks, fruit juices daily Start MiraLAX 17 g to 34 g daily Adequate intake of water I have discussed alternative options, risks & benefits,  which include, but are not limited to, bleeding, infection, perforation,respiratory complication & drug reaction.  The patient agrees with this plan & written consent will be obtained.   If colonoscopy is unremarkable, and if he continues to have recurrent rectal bleeding, can perform outpatient hemorrhoid ligation, information provided  Follow up based on the colonoscopy results   Arlyss Repress, MD

## 2023-02-17 NOTE — Telephone Encounter (Signed)
Yes - this procedure would be great to have done and is really what I suspected would be needed.  This is a common and relatively low risk procedure that is easy to tolerate as well as far as the Prep and the sedation involved for it.  He should be able to do this quite easily, and should not need to see a different doctor such as Development worker, international aid for this.  Daisey Must!

## 2023-02-17 NOTE — Telephone Encounter (Signed)
Patient went to the GI doctor and they want to do a colonostomy.  They also discussed the procedure for his hemmorhoid.  Doctor said she could do procedure in her office.  Patient would like your thoughts on the colonostomy and the procedure.  Please call patient at:  [phone]

## 2023-02-18 ENCOUNTER — Telehealth: Payer: Self-pay

## 2023-02-18 NOTE — Telephone Encounter (Signed)
Pt LVMM to reschedule colonoscopy please return call

## 2023-02-21 ENCOUNTER — Other Ambulatory Visit: Payer: Self-pay

## 2023-02-21 ENCOUNTER — Telehealth: Payer: Self-pay | Admitting: Gastroenterology

## 2023-02-21 ENCOUNTER — Telehealth: Payer: Self-pay | Admitting: Internal Medicine

## 2023-02-21 MED ORDER — GABAPENTIN 100 MG PO CAPS: 200.0000 mg | ORAL_CAPSULE | Freq: Three times a day (TID) | ORAL | 1 refills | Status: DC

## 2023-02-21 NOTE — Telephone Encounter (Signed)
Patient's wife called about a refill for patient's gabapentin. It appears to have been taken off of his medication list. They would like to know if it can be filled again at [facility] [address], [place] - [address] . If not, please call back at [phone].

## 2023-02-21 NOTE — Telephone Encounter (Signed)
Refill Sent. 
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
## 2023-02-21 NOTE — Telephone Encounter (Signed)
Pt wife carolyn LVMM to discuss banding she cancelled his colonoscopy  please return call

## 2023-02-21 NOTE — Telephone Encounter (Signed)
Called and spoke with Pt's wife & he cancelled the procedure would like to discuss it in person at his next appt.

## 2023-02-21 NOTE — Telephone Encounter (Signed)
Pt wife

## 2023-02-25 ENCOUNTER — Telehealth: Payer: Self-pay | Admitting: Gastroenterology

## 2023-02-25 ENCOUNTER — Encounter: Payer: Self-pay | Admitting: Gastroenterology

## 2023-02-25 NOTE — Telephone Encounter (Signed)
Pt wife left message to call back and schedule pt colonoscopy please return call

## 2023-02-25 NOTE — Telephone Encounter (Signed)
Returned phone call to patients wife.  She said they would like to keep colonoscopy as scheduled for 03/04/23.  Thanks, [place], [place]

## 2023-03-02 ENCOUNTER — Telehealth: Payer: Self-pay

## 2023-03-02 NOTE — Telephone Encounter (Signed)
Patient wife called and left a message because she states he wants to cancel the colonoscopy that is scheduled for 03/04/2023 because he states he is having shortness of breath and wants to see his cardiologist before having colonoscopy. He will call back if he decides to do it. Called endo and they wrote down the name and DOB and will get patient cancel talk to [place]

## 2023-03-04 ENCOUNTER — Encounter: Admission: RE | Payer: Self-pay | Source: Home / Self Care

## 2023-03-04 ENCOUNTER — Ambulatory Visit: Admission: RE | Admit: 2023-03-04 | Payer: Medicare HMO | Source: Home / Self Care | Admitting: Gastroenterology

## 2023-03-04 SURGERY — COLONOSCOPY WITH PROPOFOL
Anesthesia: General

## 2023-03-09 ENCOUNTER — Telehealth: Payer: Self-pay | Admitting: Cardiovascular Disease

## 2023-03-09 NOTE — Telephone Encounter (Signed)
Pt c/o Shortness Of Breath: STAT if SOB developed within the last 24 hours or pt is noticeably SOB on the phone  1. Are you currently SOB (can you hear that pt is SOB on the phone)?   No  2. How long have you been experiencing SOB?   About a week or so off and on  3. Are you SOB when sitting or when up moving around?   When up and moving around  4. Are you currently experiencing any other symptoms? No   Wife stated patient started having SOB about a week ago.

## 2023-03-09 NOTE — Telephone Encounter (Signed)
Pt's wife stated pt has been experiencing SOB with exertion x 1 week She stated denies chest pain or significant weight increase She stated pt had swelling in one of his arm due to a sprain that has since resolved.  Appointment scheduled for 03/21/23 for further evaluations

## 2023-03-20 NOTE — Progress Notes (Unsigned)
Cardiology Office Note  Date:  03/20/2023   ID:  Travis Carey, DOB June 30, 1941, MRN [id number]  PCP:  Corwin Levins, MD   No chief complaint on file.   HPI:  Travis Carey is a 82 year old gentleman with  coronary artery disease,  hyperlipidemia  H/o unstable angina with stenting of the RCA on 10/31/2009.   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  Last seen by myself in clinic November 2023  Ran out of diuretic over 1 weeks ago He has noticed increasing leg swelling, chest congestion, shortness of breath since then Weight down 4 pounds from 5/23  Leg edema worse,  Drinks lots of iced tea  Home BP 130 to 140 systolic Higher in the office today  Prior records reviewed echocardiogram November 2020, normal EF, LVH  Stress test 5/23 No significant ischemia, low normal ejection fraction There is coronary calcification noted Low risk study  Pain in legs, ABIs normal  EKG personally reviewed by myself on todays visit Normal sinus rhythm rate 61 bpm poor R wave progression to the anterior precordial leads, left anterior fascicular block  currently a non smoker  Off plavix, "kept getting bleeding in eye" On asa  Previous stroke symptoms, evaluated in the hospital in June 2016 He had slurred speech, difficulty swallowing  Echocardiogram was essentially normal Discharged with aspirin and Plavix   Denies any significant  palpitations, tachycardia  He works as a Curator. He continues to have hand swelling, possible carpal tunnel issues.  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  Has several dogs at home   Prior lab work showing total cholesterol 153, LDL 89 which is higher from previous lab work     PMH:   has a past medical history of  Allergic rhinitis, cause unspecified (01/31/2013), ANEMIA-NOS (01/28/2010), BPH (benign prostatic hypertrophy) (04/25/2013), CAD (02/17/2010), CAROTID BRUIT (11/18/2009), Carpal tunnel syndrome (05/20/2008), Cervical radiculitis (04/17/2012), CHEST PAIN (09/26/2009), DEPRESSION (06/12/2007), DYSPNEA (07/28/2010), ERECTILE DYSFUNCTION (02/24/2009), GERD (06/09/2007), GERD (gastroesophageal reflux disease), GLUCOSE INTOLERANCE (01/28/2010), Gout (2017), HEMORRHOIDS (06/09/2007), History of colonic polyps (08/07/2015), HYPERLIPIDEMIA (02/07/2008), HYPERTENSION (06/09/2007), HYPOKALEMIA (01/28/2010), Impaired glucose tolerance (01/23/2011), LOW BACK PAIN (06/12/2007), MYOCARDIAL PERFUSION SCAN, WITH STRESS TEST, ABNORMAL (10/08/2009), PARESTHESIA (04/25/2008), SHOULDER PAIN, LEFT (04/25/2008), URI (08/19/2008), and VITAMIN B12 DEFICIENCY (02/09/2010).  PSH:    Past Surgical History:  Procedure Laterality Date   COLONOSCOPY     CORONARY STENT PLACEMENT     from right radial    Current Outpatient Medications  Medication Sig Dispense Refill   amLODipine (NORVASC) 5 MG tablet Take 1 tablet by mouth once daily 90 tablet 0   aspirin EC 81 MG tablet Take 81 mg by mouth daily.     carvedilol (COREG) 25 MG tablet TAKE 1 TABLET BY MOUTH TWICE DAILY WITH A MEAL 180 tablet 2   diclofenac sodium (VOLTAREN) 1 % GEL Apply 2 g topically 4 (four) times daily as needed. 100 g 5   fexofenadine (ALLEGRA) 180 MG tablet Take 1 tablet (180 mg total) by mouth daily. As needed 90 tablet 3   fluticasone (FLONASE) 50 MCG/ACT nasal spray USE TWO SPRAYS IN EACH NOSTRIL ONCE DAILY 48 g 5   furosemide (LASIX) 20 MG tablet Take one tablet (20mg ) by mouth daily this week -  starting next week take lasix 20mg  three times a week. 90 tablet 3   gabapentin (NEURONTIN) 100 MG capsule Take 2 capsules (200 mg total) by mouth 3 (three) times daily. 180 capsule 1   hydrocortisone (ANUSOL-HC) 2.5 % rectal cream Place 1 Application rectally 2 (two) times daily. 30 g 1    lisinopril-hydrochlorothiazide (ZESTORETIC) 20-12.5 MG tablet Take 1 tablet by mouth daily. 90 tablet 3   meclizine (ANTIVERT) 12.5 MG tablet Take 1 tablet (12.5 mg total) by mouth 3 (three) times daily as needed for dizziness. 30 tablet 1   meloxicam (MOBIC) 7.5 MG tablet TAKE 1 TABLET BY MOUTH ONCE DAILY AS NEEDED FOR PAIN 30 tablet 0   MITIGARE 0.6 MG CAPS Take 1 capsule by mouth once daily 90 capsule 0   potassium chloride (KLOR-CON) 10 MEQ tablet TAKE 2 TABLETS BY MOUTH ONCE DAILY TAKE  AN  EXTRA  TABLET  EVERY  OTHER  DAY  WHEN  YOU  TAKE  YOUR  FUROSEMIDE  (LASIX) 180 tablet 1   rosuvastatin (CRESTOR) 10 MG tablet TAKE 1 TABLET BY MOUTH ONCE DAILY. 90 tablet 2   tamsulosin (FLOMAX) 0.4 MG CAPS capsule Take 1 capsule (0.4 mg total) by mouth daily. 90 capsule 3   No current facility-administered medications for this visit.     Allergies:   Diclofenac sodium   Social History:  The patient  reports that he has never smoked. He has never used smokeless tobacco. He reports that he does not drink alcohol and does not use drugs.   Family History:   family history includes Alcohol abuse in his brother and sister; Coronary artery disease in an other family member; Heart attack in an other family member; Heart disease in his brother, father, and mother; Hyperlipidemia in his father and mother; Hypertension in his father, mother, and another family member; Stroke in an other family member.    Review of Systems: Review of Systems  Constitutional: Negative.   HENT: Negative.    Respiratory: Negative.    Cardiovascular:  Positive for chest pain.  Gastrointestinal: Negative.   Musculoskeletal:  Positive for myalgias.  Neurological: Negative.   Psychiatric/Behavioral: Negative.    All other systems reviewed and are negative.    PHYSICAL EXAM: VS:  There were no vitals taken for this visit. , BMI There is no height or weight on file to calculate BMI. Constitutional:  oriented to person,  place, and time. No distress.  HENT:  Head: Grossly normal Eyes:  no discharge. No scleral icterus.  Neck: No JVD, no carotid bruits  Cardiovascular: Regular rate and rhythm, no murmurs appreciated Trace to 1+ pitting lower extremity edema Pulmonary/Chest: Clear to auscultation bilaterally, no wheezes or rails Abdominal: Soft.  no distension.  no tenderness.  Musculoskeletal: Normal range of motion Neurological:  normal muscle tone. Coordination normal. No atrophy Skin: Skin warm and dry Psychiatric: normal affect, pleasant  Recent Labs: 10/27/2022: ALT 11; BUN 12; Creatinine, Ser 1.03; Hemoglobin 12.1; Platelets 223.0; Potassium 3.6; Sodium 144; TSH 2.46    Lipid Panel Lab Results  Component Value Date   CHOL 127 10/27/2022   HDL 29.40 (L) 10/27/2022   LDLCALC 67 10/27/2022   TRIG 154.0 (H) 10/27/2022    Wt Readings from Last 3 Encounters:  02/17/23 203 lb (92.1 kg)  01/20/23 197 lb (89.4 kg)  01/13/23 197 lb (89.4 kg)     ASSESSMENT AND PLAN:  Atherosclerosis of native coronary artery of native heart with stable angina pectoris (HCC) -  Recent Myoview with no significant ischemia Currently with no symptoms of angina. No further workup at this time. Continue current medication regimen.  Claudication/leg pain Normal ABIs Recommended regular walking program  Essential hypertension - Plan: EKG 12-Lead Blood pressure running high in the office, improved at home Numbers may improve with resumption of his Lasix  Chronic diastolic CHF Ran out of his Lasix for more than 1 week now with leg swelling, chest congestion Restart Lasix daily this week then down to 3 days a week next week Potassium up to 30 mill equivalents daily given chronic hypokalemia  Mixed hyperlipidemia - Plan: EKG 12-Lead Cholesterol is at goal on the current lipid regimen. No changes to the medications were made.  Other emphysema (HCC) - Plan: EKG 12-Lead Reports he has stopped smoking Some of his  shortness of breath could be secondary to underlying COPD  Gastroesophageal reflux disease without esophagitis On PPI   Total encounter time more than 30 minutes  Greater than 50% was spent in counseling and coordination of care with the patient  No orders of the defined types were placed in this encounter.    Signed, Dossie Arbour, M.D., Ph.D. 03/20/2023  [facility] [place], [place] [phone]

## 2023-03-21 ENCOUNTER — Ambulatory Visit: Payer: Medicare HMO | Attending: Cardiovascular Disease | Admitting: Cardiovascular Disease

## 2023-03-21 ENCOUNTER — Encounter: Payer: Self-pay | Admitting: Cardiovascular Disease

## 2023-03-21 VITALS — BP 140/60 | HR 62 | Ht 68.0 in | Wt 202.0 lb

## 2023-03-21 DIAGNOSIS — I6523 Occlusion and stenosis of bilateral carotid arteries: Secondary | ICD-10-CM | POA: Diagnosis not present

## 2023-03-21 DIAGNOSIS — I25118 Atherosclerotic heart disease of native coronary artery with other forms of angina pectoris: Secondary | ICD-10-CM

## 2023-03-21 DIAGNOSIS — I739 Peripheral vascular disease, unspecified: Secondary | ICD-10-CM | POA: Diagnosis not present

## 2023-03-21 DIAGNOSIS — I10 Essential (primary) hypertension: Secondary | ICD-10-CM | POA: Diagnosis not present

## 2023-03-21 DIAGNOSIS — E1159 Type 2 diabetes mellitus with other circulatory complications: Secondary | ICD-10-CM

## 2023-03-21 DIAGNOSIS — R0602 Shortness of breath: Secondary | ICD-10-CM

## 2023-03-21 DIAGNOSIS — I209 Angina pectoris, unspecified: Secondary | ICD-10-CM

## 2023-03-21 DIAGNOSIS — E782 Mixed hyperlipidemia: Secondary | ICD-10-CM

## 2023-03-21 MED ORDER — FUROSEMIDE 20 MG PO TABS: ORAL_TABLET | ORAL | 3 refills | Status: DC

## 2023-03-21 NOTE — Patient Instructions (Addendum)
Cut down on fluids   Medication Instructions:  Please take lasix 40 daily with 3 potassium daily for 1 weeks Then down to 20 mg daily with 3 potassium   If you need a refill on your cardiac medications before your next appointment, please call your pharmacy.   Lab work: No new labs needed  Testing/Procedures: Echo for CHF/shortness of breath  Follow-Up: At [facility], you and your health needs are our priority.  As part of our continuing mission to provide you with exceptional heart care, we have created designated Provider Care Teams.  These Care Teams include your primary Cardiologist (physician) and Advanced Practice Providers (APPs -  Physician Assistants and Nurse Practitioners) who all work together to provide you with the care you need, when you need it.  You will need a follow up appointment in 2 months  Providers on your designated Care Team:   Nicolasa Ducking, NP Eula Listen, PA-C Cadence Fransico Michael, [place]  COVID-19 Vaccine Information can be found at: [email or web address] For questions related to vaccine distribution or appointments, please email [email or web address] or call [phone].

## 2023-03-23 ENCOUNTER — Telehealth: Payer: Self-pay | Admitting: Internal Medicine

## 2023-03-23 ENCOUNTER — Other Ambulatory Visit: Payer: Self-pay | Admitting: Internal Medicine

## 2023-03-23 NOTE — Telephone Encounter (Signed)
Prescription Request  03/23/2023  LOV: 10/27/2022  What is the name of the medication or equipment?  lisinopril-hydrochlorothiazide (ZESTORETIC) 20-12.5 MG tablet  Have you contacted your pharmacy to request a refill? Yes   Which pharmacy would you like this sent to?  [facility] [address] [place] - [address] [place] [address] Phone: [phone] Fax: [phone]    Patient notified that their request is being sent to the clinical staff for review and that they should receive a response within 2 business days.   Please advise at [facility] [phone]    Next OV is scheduled for 04/21/2023.

## 2023-03-23 NOTE — Telephone Encounter (Signed)
Refill already filled this am and sent to [email or web address] KitchenRaechel Chute

## 2023-04-07 ENCOUNTER — Ambulatory Visit: Payer: Medicare HMO | Attending: Cardiovascular Disease

## 2023-04-07 DIAGNOSIS — R0602 Shortness of breath: Secondary | ICD-10-CM | POA: Diagnosis not present

## 2023-04-10 LAB — ECHOCARDIOGRAM COMPLETE
Area-P 1/2: 4.6 cm2
Radius: 0.5 cm
S' Lateral: 2.5 cm

## 2023-04-19 ENCOUNTER — Other Ambulatory Visit: Payer: Self-pay

## 2023-04-19 ENCOUNTER — Other Ambulatory Visit: Payer: Self-pay | Admitting: Internal Medicine

## 2023-04-21 ENCOUNTER — Encounter: Payer: Self-pay | Admitting: Internal Medicine

## 2023-04-21 ENCOUNTER — Ambulatory Visit: Payer: Medicare HMO | Admitting: Internal Medicine

## 2023-04-21 VITALS — BP 122/82 | HR 62 | Temp 98.8°F | Ht 68.0 in | Wt 200.0 lb

## 2023-04-21 DIAGNOSIS — M545 Low back pain, unspecified: Secondary | ICD-10-CM | POA: Diagnosis not present

## 2023-04-21 DIAGNOSIS — E538 Deficiency of other specified B group vitamins: Secondary | ICD-10-CM | POA: Diagnosis not present

## 2023-04-21 DIAGNOSIS — I10 Essential (primary) hypertension: Secondary | ICD-10-CM | POA: Diagnosis not present

## 2023-04-21 DIAGNOSIS — J309 Allergic rhinitis, unspecified: Secondary | ICD-10-CM

## 2023-04-21 DIAGNOSIS — J329 Chronic sinusitis, unspecified: Secondary | ICD-10-CM | POA: Diagnosis not present

## 2023-04-21 DIAGNOSIS — G8929 Other chronic pain: Secondary | ICD-10-CM

## 2023-04-21 MED ORDER — PREDNISONE 10 MG PO TABS: ORAL_TABLET | ORAL | 0 refills | Status: DC

## 2023-04-21 MED ORDER — AMOXICILLIN-POT CLAVULANATE 875-125 MG PO TABS: 1.0000 | ORAL_TABLET | Freq: Two times a day (BID) | ORAL | 0 refills | Status: DC

## 2023-04-21 NOTE — Patient Instructions (Signed)
Please take all new medication as prescribed - the antibiotic, and prednisone  Please continue all other medications as before, and refills have been done if requested.  Please have the pharmacy call with any other refills you may need.  Please keep your appointments with your specialists as you may have planned  We can hold on lab testing today  Please make an Appointment to return in 6 months, or sooner if needed

## 2023-04-21 NOTE — Progress Notes (Signed)
Patient ID: Travis Carey, male   DOB: [birth year], 82 y.o.   MRN: [id number]        Chief Complaint: follow up sinusitis, allergies, right lower back pain,        HPI:  Travis Carey is a 82 y.o. male  Here with 2-3 days acute onset fever, facial pain, pressure, headache, general weakness and malaise, and greenish d/c, with mild ST and cough, but pt denies chest pain, wheezing, increased sob or doe, orthopnea, PND, increased LE swelling, palpitations, dizziness or syncope.  Does have several wks ongoing nasal allergy symptoms with clearish congestion, itch and sneezing, without fever, pain, ST, cough, swelling or wheezing.  Pt also to have 1 wk recurring right LBP mild, bowel or bladder change, fever, wt loss,  worsening LE pain/numbness/weakness, gait change or falls, but started after tried to help push a heavy car forward from behind.         Wt Readings from Last 3 Encounters:  04/21/23 200 lb (90.7 kg)  03/21/23 202 lb (91.6 kg)  02/17/23 203 lb (92.1 kg)   BP Readings from Last 3 Encounters:  04/21/23 122/82  03/21/23 (!) 140/60  02/17/23 138/76         Past Medical History:  Diagnosis Date   Allergic rhinitis, cause unspecified 01/31/2013   ANEMIA-NOS 01/28/2010   BPH (benign prostatic hypertrophy) 04/25/2013   CAD 02/17/2010   CAROTID BRUIT 11/18/2009   Carpal tunnel syndrome 05/20/2008   Cervical radiculitis 04/17/2012   CHEST PAIN 09/26/2009   DEPRESSION 06/12/2007   DYSPNEA 07/28/2010   ERECTILE DYSFUNCTION 02/24/2009   GERD 06/09/2007   GERD (gastroesophageal reflux disease)    GLUCOSE INTOLERANCE 01/28/2010   Gout 2017   Dr.Francisco Ostrovsky   HEMORRHOIDS 06/09/2007   History of colonic polyps 08/07/2015   HYPERLIPIDEMIA 02/07/2008   HYPERTENSION 06/09/2007   HYPOKALEMIA 01/28/2010   Impaired glucose tolerance 01/23/2011   LOW BACK PAIN 06/12/2007   MYOCARDIAL PERFUSION SCAN, WITH STRESS TEST, ABNORMAL 10/08/2009   PARESTHESIA 04/25/2008   SHOULDER PAIN, LEFT 04/25/2008   URI 08/19/2008   VITAMIN  B12 DEFICIENCY 02/09/2010   Past Surgical History:  Procedure Laterality Date   COLONOSCOPY     CORONARY STENT PLACEMENT     from right radial    reports that he has never smoked. He has never used smokeless tobacco. He reports that he does not drink alcohol and does not use drugs. family history includes Alcohol abuse in his brother and sister; Coronary artery disease in an other family member; Heart attack in an other family member; Heart disease in his brother, father, and mother; Hyperlipidemia in his father and mother; Hypertension in his father, mother, and another family member; Stroke in an other family member. Allergies  Allergen Reactions   Diclofenac Sodium     REACTION: maks pt drowsey   Current Outpatient Medications on File Prior to Visit  Medication Sig Dispense Refill   amLODipine (NORVASC) 5 MG tablet Take 1 tablet by mouth once daily 90 tablet 3   aspirin EC 81 MG tablet Take 81 mg by mouth daily.     carvedilol (COREG) 25 MG tablet TAKE 1 TABLET BY MOUTH TWICE DAILY WITH A MEAL 180 tablet 2   diclofenac sodium (VOLTAREN) 1 % GEL Apply 2 g topically 4 (four) times daily as needed. 100 g 5   fexofenadine (ALLEGRA) 180 MG tablet Take 1 tablet (180 mg total) by mouth daily. As needed 90 tablet 3   fluticasone (  FLONASE) 50 MCG/ACT nasal spray USE TWO SPRAYS IN EACH NOSTRIL ONCE DAILY 48 g 5   furosemide (LASIX) 20 MG tablet Take lasix 40 daily with 3 potassium daily by mouth for 1 week; Then down to 20 mg daily with 3 potassium 104 tablet 3   gabapentin (NEURONTIN) 100 MG capsule Take 2 capsules (200 mg total) by mouth 3 (three) times daily. 180 capsule 1   hydrocortisone (ANUSOL-HC) 2.5 % rectal cream Place 1 Application rectally 2 (two) times daily. 30 g 1   lisinopril-hydrochlorothiazide (ZESTORETIC) 20-12.5 MG tablet Take 1 tablet by mouth once daily 90 tablet 2   meclizine (ANTIVERT) 12.5 MG tablet Take 1 tablet (12.5 mg total) by mouth 3 (three) times daily as needed for  dizziness. 30 tablet 1   MITIGARE 0.6 MG CAPS Take 1 capsule by mouth once daily 90 capsule 0   potassium chloride (KLOR-CON) 10 MEQ tablet TAKE 2 TABLETS BY MOUTH ONCE DAILY TAKE  AN  EXTRA  TABLET  EVERY  OTHER  DAY  WHEN  YOU  TAKE  YOUR  FUROSEMIDE  (LASIX) 180 tablet 1   rosuvastatin (CRESTOR) 10 MG tablet TAKE 1 TABLET BY MOUTH ONCE DAILY. 90 tablet 2   tamsulosin (FLOMAX) 0.4 MG CAPS capsule Take 1 capsule (0.4 mg total) by mouth daily. 90 capsule 3   No current facility-administered medications on file prior to visit.        ROS:  All others reviewed and negative.  Objective        PE:  BP 122/82 (BP Location: Right Arm, Patient Position: Sitting, Cuff Size: Normal)   Pulse 62   Temp 98.8 F (37.1 C) (Oral)   Ht 5\' 8"  (1.727 m)   Wt 200 lb (90.7 kg)   SpO2 99%   BMI 30.41 kg/m                 Constitutional: Pt appears in NAD               HENT: Head: NCAT.                Right Ear: External ear normal.                 Left Ear: External ear normal. Bilat tm's with mild erythema.  Max sinus areas mild tender.  Pharynx with mild erythema, no exudate               Eyes: . Pupils are equal, round, and reactive to light. Conjunctivae and EOM are normal               Nose: without d/c or deformity               Neck: Neck supple. Gross normal ROM               Cardiovascular: Normal rate and regular rhythm.                 Pulmonary/Chest: Effort normal and breath sounds without rales or wheezing.                Abd:  Soft, NT, ND, + BS, no organomegaly               Neurological: Pt is alert. At baseline orientation, motor grossly intact               Skin: Skin is warm. No rashes, no other new lesions, LE edema - \none  Tender over right SI joint               Psychiatric: Pt behavior is normal without agitation   Micro: none  Cardiac tracings I have personally interpreted today:  none  Pertinent Radiological findings (summarize): none   Lab Results   Component Value Date   WBC 6.3 10/27/2022   HGB 12.1 (L) 10/27/2022   HCT 36.0 (L) 10/27/2022   PLT 223.0 10/27/2022   GLUCOSE 99 10/27/2022   CHOL 127 10/27/2022   TRIG 154.0 (H) 10/27/2022   HDL 29.40 (L) 10/27/2022   LDLDIRECT 58.0 04/26/2022   LDLCALC 67 10/27/2022   ALT 11 10/27/2022   AST 19 10/27/2022   NA 144 10/27/2022   K 3.6 10/27/2022   CL 106 10/27/2022   CREATININE 1.03 10/27/2022   BUN 12 10/27/2022   CO2 30 10/27/2022   TSH 2.46 10/27/2022   PSA 0.38 04/27/2021   INR 1.0 ratio 10/23/2009   HGBA1C 6.4 10/27/2022   MICROALBUR 1.5 10/27/2022   Assessment/Plan:  Travis Carey is a 82 y.o. Black or African American [2] male with  has a past medical history of Allergic rhinitis, cause unspecified (01/31/2013), ANEMIA-NOS (01/28/2010), BPH (benign prostatic hypertrophy) (04/25/2013), CAD (02/17/2010), CAROTID BRUIT (11/18/2009), Carpal tunnel syndrome (05/20/2008), Cervical radiculitis (04/17/2012), CHEST PAIN (09/26/2009), DEPRESSION (06/12/2007), DYSPNEA (07/28/2010), ERECTILE DYSFUNCTION (02/24/2009), GERD (06/09/2007), GERD (gastroesophageal reflux disease), GLUCOSE INTOLERANCE (01/28/2010), Gout (2017), HEMORRHOIDS (06/09/2007), History of colonic polyps (08/07/2015), HYPERLIPIDEMIA (02/07/2008), HYPERTENSION (06/09/2007), HYPOKALEMIA (01/28/2010), Impaired glucose tolerance (01/23/2011), LOW BACK PAIN (06/12/2007), MYOCARDIAL PERFUSION SCAN, WITH STRESS TEST, ABNORMAL (10/08/2009), PARESTHESIA (04/25/2008), SHOULDER PAIN, LEFT (04/25/2008), URI (08/19/2008), and VITAMIN B12 DEFICIENCY (02/09/2010).  Sinusitis Mild to mod, for antibx course augmentin bid,  to f/u any worsening symptoms or concerns  Allergic rhinitis With mild seasonal flare - for prednisone short course low dose.    LOW BACK PAIN Exam today c/w right SI joint pain exacerbation after pushing a heavy car; pt declines mobic 7.5, for tylenol prn  Hypertension BP Readings from Last 3 Encounters:  04/21/23 122/82  03/21/23 (!)  140/60  02/17/23 138/76   Stable, pt to continue medical treatment norvasc 5 every day, coreg 25 bid   B12 deficiency Lab Results  Component Value Date   VITAMINB12 >1500 (H) 10/27/2022   Stable, cont oral replacement - b12 1000 mcg qd  Followup: Return in about 6 months (around 10/22/2023).  Oliver Barre, MD 04/24/2023 3:53 PM [facility]

## 2023-04-24 ENCOUNTER — Encounter: Payer: Self-pay | Admitting: Internal Medicine

## 2023-04-24 NOTE — Assessment & Plan Note (Signed)
BP Readings from Last 3 Encounters:  04/21/23 122/82  03/21/23 (!) 140/60  02/17/23 138/76   Stable, pt to continue medical treatment norvasc 5 every day, coreg 25 bid

## 2023-04-24 NOTE — Assessment & Plan Note (Signed)
Exam today c/w right SI joint pain exacerbation after pushing a heavy car; pt declines mobic 7.5, for tylenol prn

## 2023-04-24 NOTE — Assessment & Plan Note (Signed)
Lab Results  Component Value Date   VITAMINB12 >1500 (H) 10/27/2022   Stable, cont oral replacement - b12 1000 mcg qd  

## 2023-04-24 NOTE — Assessment & Plan Note (Signed)
With mild seasonal flare - for prednisone short course low dose.

## 2023-04-24 NOTE — Assessment & Plan Note (Addendum)
Mild to mod, for antibx course- augmentin bid,  to f/u any worsening symptoms or concerns 

## 2023-05-10 ENCOUNTER — Ambulatory Visit (INDEPENDENT_AMBULATORY_CARE_PROVIDER_SITE_OTHER): Payer: Medicare HMO

## 2023-05-10 ENCOUNTER — Ambulatory Visit (INDEPENDENT_AMBULATORY_CARE_PROVIDER_SITE_OTHER): Payer: Medicare HMO | Admitting: Emergency Medicine

## 2023-05-10 ENCOUNTER — Encounter: Payer: Self-pay | Admitting: Emergency Medicine

## 2023-05-10 VITALS — BP 132/80 | HR 64 | Temp 98.0°F | Ht 68.0 in | Wt 200.0 lb

## 2023-05-10 DIAGNOSIS — M25551 Pain in right hip: Secondary | ICD-10-CM

## 2023-05-10 MED ORDER — MELOXICAM 15 MG PO TABS
15.0000 mg | ORAL_TABLET | Freq: Every day | ORAL | 0 refills | Status: AC
Start: 2023-05-10 — End: 2023-05-20

## 2023-05-10 NOTE — Patient Instructions (Signed)
Hip Pain The hip is the joint between the upper legs and the lower pelvis. The bones, cartilage, tendons, and muscles of your hip joint support your body and allow you to move around. Hip pain can range from a minor ache to severe pain in one or both of your hips. The pain may be felt on the inside of the hip joint near the groin, or on the outside near the buttocks and upper thigh. You may also have swelling or stiffness in your hip area. Follow these instructions at home: Managing pain, stiffness, and swelling     If told, put ice on the painful area. Put ice in a plastic bag. Place a towel between your skin and the bag. Leave the ice on for 20 minutes, 2-3 times a day. If told, apply heat to the affected area as often as told by your health care provider. Use the heat source that your provider recommends, such as a moist heat pack or a heating pad. Place a towel between your skin and the heat source. Leave the heat on for 20-30 minutes. If your skin turns bright red, remove the ice or heat right away to prevent skin damage. The risk of damage is higher if you cannot feel pain, heat, or cold. Activity Do exercises as told by your provider. Avoid activities that cause pain. General instructions  Take over-the-counter and prescription medicines only as told by your provider. Keep a journal of your symptoms. Write down: How often you have hip pain. The location of your pain. What the pain feels like. What makes the pain worse. Sleep with a pillow between your legs on your most comfortable side. Keep all follow-up visits. Your provider will monitor your pain and activity. Contact a health care provider if: You cannot put weight on your leg. Your pain or swelling gets worse after a week. It gets harder to walk. You have a fever. Get help right away if: You fall. You have a sudden increase in pain and swelling in your hip. Your hip is red or swollen or very tender to touch. This  information is not intended to replace advice given to you by your health care provider. Make sure you discuss any questions you have with your health care provider. Document Revised: 06/01/2022 Document Reviewed: 06/01/2022 Elsevier Patient Education  2024 Elsevier Inc.  

## 2023-05-10 NOTE — Assessment & Plan Note (Signed)
Clinically stable.  No red flag signs or symptoms Recent onset.  Stable exam. X-rays done today.  Report reviewed Different diagnosis discussed. Pain management discussed. Recommend meloxicam 15 mg daily for 10 days. May need orthopedic evaluation.

## 2023-05-10 NOTE — Progress Notes (Signed)
Travis Carey 82 y.o.   Chief Complaint  Patient presents with   Hip Pain    Patient states he is having some pain in right hip x 1 week     HISTORY OF PRESENT ILLNESS: Acute problem visit today.  Patient of Dr. Oliver Barre. This is a 82 y.o. male complaining of pain to his right hip that started about a week ago when he was bending down and as he was getting up felt sharp nonradiating pain to right hip area.  No associated symptoms.  Today pain still about the same as 1 week ago. No other complaints or any other medical concerns today.  Hip Pain      Prior to Admission medications   Medication Sig Start Date End Date Taking? Authorizing Provider  amLODipine (NORVASC) 5 MG tablet Take 1 tablet by mouth once daily 04/19/23  Yes Corwin Levins, MD  aspirin EC 81 MG tablet Take 81 mg by mouth daily.   Yes [provider]  carvedilol (COREG) 25 MG tablet TAKE 1 TABLET BY MOUTH TWICE DAILY WITH A MEAL 09/02/22  Yes Corwin Levins, MD  diclofenac sodium (VOLTAREN) 1 % GEL Apply 2 g topically 4 (four) times daily as needed. 06/07/19  Yes Corwin Levins, MD  fexofenadine (ALLEGRA) 180 MG tablet Take 1 tablet (180 mg total) by mouth daily. As needed 06/12/14  Yes Corwin Levins, MD  fluticasone [facility]) 50 MCG/ACT nasal spray USE TWO SPRAYS IN [facility] NOSTRIL ONCE DAILY 08/06/16  Yes Corwin Levins, MD  furosemide (LASIX) 20 MG tablet Take lasix 40 daily with 3 potassium daily by mouth for 1 week; Then down to 20 mg daily with 3 potassium 03/21/23  Yes Gollan, Tollie Pizza, MD  gabapentin (NEURONTIN) 100 MG capsule Take 2 capsules (200 mg total) by mouth 3 (three) times daily. 02/21/23  Yes Corwin Levins, MD  hydrocortisone (ANUSOL-HC) 2.5 % rectal cream Place 1 Application rectally 2 (two) times daily. 10/27/22  Yes Corwin Levins, MD  lisinopril-hydrochlorothiazide (ZESTORETIC) 20-12.5 MG tablet Take 1 tablet by mouth once daily 03/23/23  Yes Corwin Levins, MD  MITIGARE 0.6 MG CAPS Take 1 capsule by  mouth once daily 10/09/21  Yes Corwin Levins, MD  potassium chloride (KLOR-CON) 10 MEQ tablet TAKE 2 TABLETS BY MOUTH ONCE DAILY TAKE  AN  EXTRA  TABLET  EVERY  OTHER  DAY  WHEN  YOU  TAKE  YOUR  FUROSEMIDE  (LASIX) 12/06/22  Yes Gollan, Tollie Pizza, MD  predniSONE (DELTASONE) 10 MG tablet 3 tabs by mouth per day for 3 days,2tabs per day for 3 days,1tab per day for 3 days 04/21/23  Yes Corwin Levins, MD  rosuvastatin (CRESTOR) 10 MG tablet TAKE 1 TABLET BY MOUTH ONCE DAILY. 01/10/23  Yes Corwin Levins, MD  tamsulosin (FLOMAX) 0.4 MG CAPS capsule Take 1 capsule (0.4 mg total) by mouth daily. 07/07/22  Yes Corwin Levins, MD    Allergies  Allergen Reactions   Diclofenac Sodium     REACTION: maks pt drowsey    Patient Active Problem List   Diagnosis Date Noted   Internal hemorrhoid, bleeding 10/27/2022   Sinusitis 04/28/2022   Vitamin D deficiency 11/01/2021   Positive ANA (antinuclear antibody) 08/20/2021   Bilateral leg pain 06/19/2021   Right hip pain 05/25/2021   Right knee pain 04/27/2021   Bilateral hand numbness 10/02/2020   Bilateral carotid artery stenosis 08/06/2019   Lumbar spinal stenosis  12/07/2018   Bilateral hand pain 08/17/2018   Left groin pain 08/17/2018   Right lumbar radiculopathy 02/02/2018   Right Achilles tendinitis 06/08/2017   Swelling of extremity 06/08/2017   Achilles tendon disorder, right 05/03/2017   Acute gouty arthritis 09/16/2016   Epididymo-orchitis 08/05/2016   Vertigo 05/05/2016   History of colonic polyps 08/07/2015   CVA (cerebral infarction) 04/07/2015   Urinary frequency 03/29/2015   Diabetes (HCC) 03/29/2015   Right arm pain 11/27/2014   Epididymal cyst 06/12/2014   Left wrist pain 11/01/2013   Right wrist pain 11/01/2013   Nocturia 11/01/2013   Encounter for well adult exam with abnormal findings 11/01/2013   Obese 09/12/2013   Benign prostatic hyperplasia 04/25/2013   Knee pain, bilateral 04/25/2013   Chest pain 01/31/2013   Eustachian  tube dysfunction 01/31/2013   Cough 01/31/2013   Allergic rhinitis 01/31/2013   Cervical radiculitis 04/17/2012   Cervical disc disease 04/17/2012   Paresthesia of arm 12/08/2011   Hypertension 12/08/2011   Myalgia 11/05/2011   B12 deficiency 11/05/2011   Fatigue 07/27/2011   COPD (chronic obstructive pulmonary disease) (HCC) 01/26/2011   Left knee pain 01/26/2011   Coronary atherosclerosis 02/17/2010   Hypokalemia 01/28/2010   ANEMIA-NOS 01/28/2010   CAROTID BRUIT 11/18/2009   Carotid bruit 11/18/2009   ERECTILE DYSFUNCTION 02/24/2009   Carpal tunnel syndrome 05/20/2008   PARESTHESIA 04/25/2008   Hyperlipidemia 02/07/2008   DEPRESSION 06/12/2007   LOW BACK PAIN 06/12/2007   Gastroesophageal reflux disease without esophagitis 06/09/2007    Past Medical History:  Diagnosis Date   Allergic rhinitis, cause unspecified 01/31/2013   ANEMIA-NOS 01/28/2010   BPH (benign prostatic hypertrophy) 04/25/2013   CAD 02/17/2010   CAROTID BRUIT 11/18/2009   Carpal tunnel syndrome 05/20/2008   Cervical radiculitis 04/17/2012   CHEST PAIN 09/26/2009   DEPRESSION 06/12/2007   DYSPNEA 07/28/2010   ERECTILE DYSFUNCTION 02/24/2009   GERD 06/09/2007   GERD (gastroesophageal reflux disease)    GLUCOSE INTOLERANCE 01/28/2010   Gout 2017   Dr.John   HEMORRHOIDS 06/09/2007   History of colonic polyps 08/07/2015   HYPERLIPIDEMIA 02/07/2008   HYPERTENSION 06/09/2007   HYPOKALEMIA 01/28/2010   Impaired glucose tolerance 01/23/2011   LOW BACK PAIN 06/12/2007   MYOCARDIAL PERFUSION SCAN, WITH STRESS TEST, ABNORMAL 10/08/2009   PARESTHESIA 04/25/2008   SHOULDER PAIN, LEFT 04/25/2008   URI 08/19/2008   VITAMIN B12 DEFICIENCY 02/09/2010    Past Surgical History:  Procedure Laterality Date   COLONOSCOPY     CORONARY STENT PLACEMENT     from right radial    Social History   Socioeconomic History   Marital status: Widowed    Spouse name: Not on file   Number of children: 5   Years of education: Not on file    Highest education level: Not on file  Occupational History   Occupation: Curator   Occupation: minister  Tobacco Use   Smoking status: Never   Smokeless tobacco: Never  Vaping Use   Vaping status: Never Used  Substance and Sexual Activity   Alcohol use: No   Drug use: No   Sexual activity: Not on file  Other Topics Concern   Not on file  Social History Narrative   Lives in [place]. Works as Curator. 5 children.      Wife died 12-21-2007   Social Determinants of Health   Financial Resource Strain: Low Risk  (01/13/2023)   Overall Financial Resource Strain (CARDIA)    Difficulty of Paying Living Expenses:  Not hard at all  Food Insecurity: No Food Insecurity (01/13/2023)   Hunger Vital Sign    Worried About Running Out of Food in the Last Year: Never true    Ran Out of Food in the Last Year: Never true  Transportation Needs: No Transportation Needs (01/13/2023)   PRAPARE - Administrator, Civil Service (Medical): No    Lack of Transportation (Non-Medical): No  Physical Activity: Sufficiently Active (01/13/2023)   Exercise Vital Sign    Days of Exercise per Week: 5 days    Minutes of Exercise per Session: 30 min  Stress: No Stress Concern Present (01/13/2023)   [employer] of Occupational Health - Occupational Stress Questionnaire    Feeling of Stress : Not at all  Social Connections: Socially Integrated (01/13/2023)   Social Connection and Isolation Panel [NHANES]    Frequency of Communication with Friends and Family: More than three times a week    Frequency of Social Gatherings with Friends and Family: More than three times a week    Attends Religious Services: More than 4 times per year    Active Member of [employer] or Organizations: Yes    Attends Engineer, structural: More than 4 times per year    Marital Status: Married  Catering manager Violence: Not At Risk (01/13/2023)   Humiliation, Afraid, Rape, and Kick questionnaire    Fear of Current or  Ex-Partner: No    Emotionally Abused: No    Physically Abused: No    Sexually Abused: No    Family History  Problem Relation Age of Onset   Hyperlipidemia Mother    Heart disease Mother    Hypertension Mother    Hyperlipidemia Father    Heart disease Father    Hypertension Father    Alcohol abuse Sister        ETOH dependence   Alcohol abuse Brother        ETOH  dependence   Heart disease Brother    Heart attack Other    Stroke Other    Hypertension Other    Coronary artery disease Other      Review of Systems  Constitutional: Negative.  Negative for chills and fever.  HENT: Negative.  Negative for congestion and sore throat.   Respiratory: Negative.  Negative for cough and shortness of breath.   Cardiovascular:  Negative for chest pain and palpitations.  Gastrointestinal:  Negative for abdominal pain, diarrhea, nausea and vomiting.  Musculoskeletal:  Positive for joint pain (Right hip pain).  Skin: Negative.  Negative for rash.  Neurological: Negative.  Negative for dizziness and headaches.  All other systems reviewed and are negative.   Vitals:   05/10/23 0943  BP: 132/80  Pulse: 64  Temp: 98 F (36.7 C)  SpO2: 95%    Physical Exam Vitals reviewed.  Constitutional:      Appearance: Normal appearance.  HENT:     Head: Normocephalic.  Eyes:     Extraocular Movements: Extraocular movements intact.  Cardiovascular:     Rate and Rhythm: Normal rate.  Pulmonary:     Effort: Pulmonary effort is normal.  Musculoskeletal:     Comments: Right hip: Painful range of motion  Skin:    General: Skin is warm and dry.  Neurological:     Mental Status: He is alert and oriented to person, place, and time.  Psychiatric:        Mood and Affect: Mood normal.  Behavior: Behavior normal.    DG HIP UNILAT W OR W/O PELVIS 2-3 VIEWS RIGHT  Result Date: 05/10/2023 CLINICAL DATA:  Right hip pain for 1 week. EXAM: DG HIP (WITH OR WITHOUT PELVIS) 2-3V RIGHT  COMPARISON:  None Available. FINDINGS: There is no evidence of hip fracture or dislocation. There is no evidence of arthropathy or other focal bone abnormality. IMPRESSION: Negative. Electronically Signed   By: Kennith Center M.D.   On: 05/10/2023 11:12     ASSESSMENT & PLAN: A total of 32 minutes was spent with the patient and counseling/coordination of care regarding preparing for this visit, review of most recent office visit notes, review of chronic medical conditions under management, review of all medications, review of x-ray report done today, differential diagnosis of chronic right hip pain and need for orthopedic evaluation, pain management, prognosis, documentation, and need for follow-up.  Problem List Items Addressed This Visit       Other   Right hip pain - Primary    Clinically stable.  No red flag signs or symptoms Recent onset.  Stable exam. X-rays done today.  Report reviewed Different diagnosis discussed. Pain management discussed. Recommend meloxicam 15 mg daily for 10 days. May need orthopedic evaluation.      Relevant Medications   meloxicam (MOBIC) 15 MG tablet   Other Relevant Orders   DG HIP UNILAT W OR W/O PELVIS 2-3 VIEWS RIGHT   Patient Instructions  Hip Pain The hip is the joint between the upper legs and the lower pelvis. The bones, cartilage, tendons, and muscles of your hip joint support your body and allow you to move around. Hip pain can range from a minor ache to severe pain in one or both of your hips. The pain may be felt on the inside of the hip joint near the groin, or on the outside near the buttocks and upper thigh. You may also have swelling or stiffness in your hip area. Follow these instructions at home: Managing pain, stiffness, and swelling     If told, put ice on the painful area. Put ice in a plastic bag. Place a towel between your skin and the bag. Leave the ice on for 20 minutes, 2-3 times a day. If told, apply heat to the affected  area as often as told by your health care provider. Use the heat source that your provider recommends, such as a moist heat pack or a heating pad. Place a towel between your skin and the heat source. Leave the heat on for 20-30 minutes. If your skin turns bright red, remove the ice or heat right away to prevent skin damage. The risk of damage is higher if you cannot feel pain, heat, or cold. Activity Do exercises as told by your provider. Avoid activities that cause pain. General instructions  Take over-the-counter and prescription medicines only as told by your provider. Keep a journal of your symptoms. Write down: How often you have hip pain. The location of your pain. What the pain feels like. What makes the pain worse. Sleep with a pillow between your legs on your most comfortable side. Keep all follow-up visits. Your provider will monitor your pain and activity. Contact a health care provider if: You cannot put weight on your leg. Your pain or swelling gets worse after a week. It gets harder to walk. You have a fever. Get help right away if: You fall. You have a sudden increase in pain and swelling in your hip. Your hip  is red or swollen or very tender to touch. This information is not intended to replace advice given to you by your health care provider. Make sure you discuss any questions you have with your health care provider. Document Revised: 06/01/2022 Document Reviewed: 06/01/2022 Elsevier Patient Education  2024 Elsevier Inc.     Edwina Barth, MD [facility]

## 2023-05-13 ENCOUNTER — Telehealth: Payer: Self-pay | Admitting: Internal Medicine

## 2023-05-13 MED ORDER — TRAMADOL HCL 50 MG PO TABS: 50.0000 mg | ORAL_TABLET | Freq: Four times a day (QID) | ORAL | 0 refills | Status: DC | PRN

## 2023-05-13 NOTE — Telephone Encounter (Signed)
Notified pt/wife w/MD response../lmb 

## 2023-05-13 NOTE — Telephone Encounter (Signed)
Pt saw Dr. Alvy Bimler on 7/30. He is still having right side pain.Marland KitchenRaechel Chute

## 2023-05-13 NOTE — Telephone Encounter (Signed)
Patient was seen by other provider this week.  Patient is still in a lot of pain and would like something called in - he feels like it is his sciatic nerve.  Please call patient and advise.  [phone]

## 2023-05-13 NOTE — Telephone Encounter (Signed)
Galt for tramadol prn - done erx

## 2023-06-01 ENCOUNTER — Other Ambulatory Visit: Payer: Self-pay | Admitting: Internal Medicine

## 2023-06-02 ENCOUNTER — Encounter: Payer: Self-pay | Admitting: Cardiology

## 2023-06-02 ENCOUNTER — Ambulatory Visit: Payer: Medicare HMO | Attending: Cardiology | Admitting: Cardiology

## 2023-06-02 VITALS — BP 162/75 | HR 71 | Ht 68.0 in | Wt 195.2 lb

## 2023-06-02 DIAGNOSIS — I10 Essential (primary) hypertension: Secondary | ICD-10-CM | POA: Diagnosis not present

## 2023-06-02 DIAGNOSIS — I6523 Occlusion and stenosis of bilateral carotid arteries: Secondary | ICD-10-CM | POA: Diagnosis not present

## 2023-06-02 DIAGNOSIS — I739 Peripheral vascular disease, unspecified: Secondary | ICD-10-CM

## 2023-06-02 DIAGNOSIS — I5032 Chronic diastolic (congestive) heart failure: Secondary | ICD-10-CM | POA: Diagnosis not present

## 2023-06-02 DIAGNOSIS — E782 Mixed hyperlipidemia: Secondary | ICD-10-CM | POA: Diagnosis not present

## 2023-06-02 DIAGNOSIS — I25118 Atherosclerotic heart disease of native coronary artery with other forms of angina pectoris: Secondary | ICD-10-CM | POA: Diagnosis not present

## 2023-06-02 DIAGNOSIS — M25551 Pain in right hip: Secondary | ICD-10-CM

## 2023-06-02 MED ORDER — FLUTICASONE PROPIONATE 50 MCG/ACT NA SUSP: 2.0000 | Freq: Every day | NASAL | 2 refills | Status: AC

## 2023-06-02 MED ORDER — FUROSEMIDE 20 MG PO TABS: ORAL_TABLET | ORAL | 3 refills | Status: DC

## 2023-06-02 NOTE — Progress Notes (Signed)
Cardiology Office Note:  .   Date:  06/02/2023  ID:  Travis Carey, DOB Feb 04, 1941, MRN [id number] PCP: Corwin Levins, MD  [facility] Providers Cardiologist:  Julien Nordmann, MD    History of Present Illness: .   Travis Carey is a 82 y.o. male with past medical history of coronary artery disease with stenting RCA (10/2009), TIA/CVA (03/2015), type 2 diabetes, hyperlipidemia, peripheral arterial disease with claudication, hypertension, bilateral carotid artery stenosis, COPD, gastroesophageal reflux disease, who is here today for follow-up.  Patient previously underwent left heart catheterization after having continued complaints of unstable angina.  He had stenting to the RCA on 10/31/2009.  He had a TIA/CVA on 03/2015 in the setting of working in the heat.  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  Congenital clinic repeated echo in 2020 that showed moderate MR, severe LVH, and HCM.  He underwent stress testing in May 2023 that was considered low risk with no significant ischemia.  ABIs were completed for claudication which revealed is normal.  He was last seen in clinic 03/21/2023 by Dr. Mariah Milling.  At that time he had complaints of shortness of breath and swelling to his bilateral lower extremities, hands, and chest tightness.  EKG was unchanged but with swelling to his bilateral lower extremities he was started on furosemide and potassium supplements.  He is also scheduled for repeat echocardiogram.  He returns to clinic today stating that overall he has been doing well.  He does state that he has a chest cold.  He previously been prescribed antibiotic therapy which she stated that he quit taking he is now only taking cough syrup using his antihistamine and Flonase.  He denies any worsening shortness of breath, chest pain, and states that his peripheral edema has improved.  He has not continues to see diuretic and notes continued  improvement in the swelling to his bilateral lower extremities.  States that he has been compliant with his medications and has been taking his blood pressures at home has been eating blood pressures 130/70.  Denies any hospitalizations or visits to the emergency department  ROS: Review of systems has been reviewed and considered negative with exception of what is listed in the HPI  Studies Reviewed: Marland Kitchen      TTE 04/07/23  1. Left ventricular ejection fraction, by estimation, is 60 to 65%. The  left ventricle has normal function. The left ventricle has no regional  wall motion abnormalities. There is moderate left ventricular hypertrophy.  No significanct LVOT gradient Left  ventricular diastolic parameters were indeterminate. The average left  ventricular global longitudinal strain is -13.8 %.   2. Right ventricular systolic function is normal. The right ventricular  size is normal. Tricuspid regurgitation signal is inadequate for assessing  PA pressure.   3. Left atrial size was severely dilated.   4. The mitral valve is normal in structure. Moderate mitral valve  regurgitation. No evidence of mitral stenosis.   5. The aortic valve has an indeterminant number of cusps. Aortic valve  regurgitation is mild. No aortic stenosis is present.   6. The inferior vena cava is normal in size with greater than 50%  respiratory variability, suggesting right atrial pressure of 3 mmHg.   Lexiscan MPI 02/16/22   Normal pharmacologic myocardial perfusion stress test without evidence of significant ischemia or scar.   Low normal left ventricular systolic function (LVEF 50-55%).   Coronary artery calcification, RCA  stent, and aortic atherosclerosis are noted.   Incidental note is made of small left and tiny right pleural effusions.   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   This is a low risk study. Risk  Assessment/Calculations:     HYPERTENSION CONTROL Vitals:   06/02/23 1542 06/02/23 1558  BP: (!) 160/74 (!) 162/75    The patient's blood pressure is elevated above target today.  In order to address the patient's elevated BP: Blood pressure will be monitored at home to determine if medication changes need to be made. (home log with Bp 130/70 and lower)          Physical Exam:   VS:  BP (!) 162/75 (BP Location: Right Arm, Patient Position: Sitting, Cuff Size: Normal)   Pulse 71   Ht 5\' 8"  (1.727 m)   Wt 195 lb 3.2 oz (88.5 kg)   SpO2 97%   BMI 29.68 kg/m    Wt Readings from Last 3 Encounters:  06/02/23 195 lb 3.2 oz (88.5 kg)  05/10/23 200 lb (90.7 kg)  04/21/23 200 lb (90.7 kg)    GEN: Well nourished, well developed in no acute distress NECK: No JVD; + carotid bruits CARDIAC: RRR, no murmurs, rubs, gallops RESPIRATORY:  Clear to auscultation without rales, wheezing or rhonchi  ABDOMEN: Soft, non-tender, non-distended EXTREMITIES:  1+ edema to BLE; No deformity   ASSESSMENT AND PLAN: .   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  Left moderate mitral regurgitation.  Patient states that swelling has improved.  He continues to take furosemide 20 mg daily.  States that his shortness of breath has improved.  Following labs today with CMP and CBC located escalating treatment with SGLT2 inhibitor or MRA on return.  Primary hypertension with a blood pressure 160/70 4 repeat was 162/75 with his home blood pressures running 130/70.  Patient states blood pressure is elevated today after after walking in from the parking lot.  He is continued on amlodipine 5 mg daily, lisinopril/HCTZ, carvedilol 25 mg twice daily, and  furosemide.  Encouraged to continue to monitor blood pressures 1 to 2 hours post medication administration at home.  Coronary artery disease of native coronary artery without angina.   Patient denies any chest discomfort or anginal equivalents.  Previous stress testing completed in 02/2022 showed no ischemia.  He is continued on aspirin 81 mg daily and rosuvastatin 10 mg daily.  Mixed hyperlipidemia with last LDL of 67.  He is continued on rosuvastatin 10 mg daily.  This continues to be monitored by his PCP.  Continued right hip pain with possible sciatica being treated by PCP.  Patient was recently placed on tramadol.  Continue management by PCP.  Peripheral arterial disease with complaints of claudication with normal ABIs.  Carotid bruit noted since 2011 with last carotid duplex imaging completed 2016.  Will order repeat study on return.  Previous vascular studies were unremarkable without significant stenosis.        Dispo: Patient to return to clinic to see MD/APP in 3 months or sooner if needed  Signed, Nicoya Friel, NP

## 2023-06-02 NOTE — Patient Instructions (Signed)
Medication Instructions:  Your physician recommends that you continue on your current medications as directed. Please refer to the Current Medication list given to you today.  *If you need a refill on your cardiac medications before your next appointment, please call your pharmacy*   Lab Work: Your provider would like for you to have following labs drawn today CBC, CMP.   If you have labs (blood work) drawn today and your tests are completely normal, you will receive your results only by: MyChart Message (if you have MyChart) OR A paper copy in the mail If you have any lab test that is abnormal or we need to change your treatment, we will call you to review the results.   Testing/Procedures: none  Follow-Up: At [facility], you and your health needs are our priority.  As part of our continuing mission to provide you with exceptional heart care, we have created designated Provider Care Teams.  These Care Teams include your primary Cardiologist (physician) and Advanced Practice Providers (APPs -  Physician Assistants and Nurse Practitioners) who all work together to provide you with the care you need, when you need it.  We recommend signing up for the patient portal called "MyChart".  Sign up information is provided on this After Visit Summary.  MyChart is used to connect with patients for Virtual Visits (Telemedicine).  Patients are able to view lab/test results, encounter notes, upcoming appointments, etc.  Non-urgent messages can be sent to your provider as well.   To learn more about what you can do with MyChart, go to [email or web address].    Your next appointment:   3 month(s)  Provider:   Charlsie Quest, NP

## 2023-06-03 LAB — COMPREHENSIVE METABOLIC PANEL
ALT: 14 IU/L (ref 0–44)
AST: 23 IU/L (ref 0–40)
Albumin: 4 g/dL (ref 3.7–4.7)
Alkaline Phosphatase: 69 IU/L (ref 44–121)
BUN/Creatinine Ratio: 14 (ref 10–24)
BUN: 17 mg/dL (ref 8–27)
Bilirubin Total: 0.3 mg/dL (ref 0.0–1.2)
CO2: 27 mmol/L (ref 20–29)
Calcium: 9.9 mg/dL (ref 8.6–10.2)
Chloride: 106 mmol/L (ref 96–106)
Creatinine, Ser: 1.23 mg/dL (ref 0.76–1.27)
Globulin, Total: 2.6 g/dL (ref 1.5–4.5)
Glucose: 115 mg/dL — ABNORMAL HIGH (ref 70–99)
Potassium: 4.2 mmol/L (ref 3.5–5.2)
Sodium: 144 mmol/L (ref 134–144)
Total Protein: 6.6 g/dL (ref 6.0–8.5)
eGFR: 59 mL/min/{1.73_m2} — ABNORMAL LOW (ref >59)

## 2023-06-03 LAB — CBC
Hematocrit: 36.9 % — ABNORMAL LOW (ref 37.5–51.0)
Hemoglobin: 11.8 g/dL — ABNORMAL LOW (ref 13.0–17.7)
MCH: 28.6 pg (ref 26.6–33.0)
MCHC: 32 g/dL (ref 31.5–35.7)
MCV: 90 fL (ref 79–97)
Platelets: 284 10*3/uL (ref 150–450)
RBC: 4.12 x10E6/uL — ABNORMAL LOW (ref 4.14–5.80)
RDW: 13.9 % (ref 11.6–15.4)
WBC: 6.2 10*3/uL (ref 3.4–10.8)

## 2023-06-06 ENCOUNTER — Telehealth: Payer: Self-pay | Admitting: Internal Medicine

## 2023-06-06 DIAGNOSIS — M25551 Pain in right hip: Secondary | ICD-10-CM

## 2023-06-06 NOTE — Telephone Encounter (Signed)
See below

## 2023-06-06 NOTE — Telephone Encounter (Signed)
Pt called stating that is still having pain in his leg pt stated he is still taking the pain medication but don't want depend on the medication and wanted to know is there something else he can take ad pt also been a bad cough. Please advise.

## 2023-06-06 NOTE — Progress Notes (Signed)
Discussed at appointment.

## 2023-06-06 NOTE — Telephone Encounter (Signed)
Ok for ortho referral as suggested at OV per Dr Kathie Rhodes.   I am not sure about the cough, may need to consider ROV if this is new problem  Delsym otc can be helpful, but may need OV if has fever, pain, sob or other new symptoms

## 2023-06-06 NOTE — Telephone Encounter (Signed)
Pt wife called back and she states understanding.

## 2023-06-06 NOTE — Telephone Encounter (Signed)
Called and unable to leave voicemail

## 2023-06-07 ENCOUNTER — Telehealth: Payer: Self-pay | Admitting: Internal Medicine

## 2023-06-07 DIAGNOSIS — E1165 Type 2 diabetes mellitus with hyperglycemia: Secondary | ICD-10-CM

## 2023-06-07 DIAGNOSIS — E538 Deficiency of other specified B group vitamins: Secondary | ICD-10-CM

## 2023-06-07 DIAGNOSIS — E559 Vitamin D deficiency, unspecified: Secondary | ICD-10-CM

## 2023-06-07 NOTE — Telephone Encounter (Signed)
I can only say for the patient, since his wife name and MRN are not included in this note  I see some labs done aug 22 but I will order rest for him  Ok to do in [place] if the lab will allow this

## 2023-06-07 NOTE — Telephone Encounter (Signed)
Patient and his wife want to know if they can have blood work done in [place] before their next physical.  She also wants to know if the blood work that the heart doctor did on Travis Carey would be sufficient that he would not need to have it done again.  Please call patient and his wife and let her know.

## 2023-06-16 ENCOUNTER — Encounter: Payer: Self-pay | Admitting: Emergency Medicine

## 2023-06-16 ENCOUNTER — Emergency Department
Admission: EM | Admit: 2023-06-16 | Discharge: 2023-06-16 | Disposition: A | Discharge status: Home / Self Care | Payer: Medicare HMO | Attending: Emergency Medicine | Admitting: Emergency Medicine

## 2023-06-16 ENCOUNTER — Other Ambulatory Visit: Payer: Self-pay

## 2023-06-16 ENCOUNTER — Emergency Department: Payer: Medicare HMO

## 2023-06-16 DIAGNOSIS — R918 Other nonspecific abnormal finding of lung field: Secondary | ICD-10-CM | POA: Diagnosis not present

## 2023-06-16 DIAGNOSIS — J181 Lobar pneumonia, unspecified organism: Secondary | ICD-10-CM | POA: Insufficient documentation

## 2023-06-16 DIAGNOSIS — Z20822 Contact with and (suspected) exposure to covid-19: Secondary | ICD-10-CM | POA: Diagnosis not present

## 2023-06-16 DIAGNOSIS — J449 Chronic obstructive pulmonary disease, unspecified: Secondary | ICD-10-CM | POA: Insufficient documentation

## 2023-06-16 DIAGNOSIS — R059 Cough, unspecified: Secondary | ICD-10-CM | POA: Diagnosis not present

## 2023-06-16 DIAGNOSIS — J90 Pleural effusion, not elsewhere classified: Secondary | ICD-10-CM | POA: Diagnosis not present

## 2023-06-16 DIAGNOSIS — J189 Pneumonia, unspecified organism: Secondary | ICD-10-CM | POA: Diagnosis not present

## 2023-06-16 LAB — RESP PANEL BY RT-PCR (RSV, FLU A&B, COVID)  RVPGX2
Influenza A by PCR: NEGATIVE
Influenza B by PCR: NEGATIVE
Resp Syncytial Virus by PCR: NEGATIVE
SARS Coronavirus 2 by RT PCR: NEGATIVE

## 2023-06-16 MED ORDER — LIDOCAINE HCL (PF) 1 % IJ SOLN
5.0000 mL | Freq: Once | INTRAMUSCULAR | Status: AC
  Administered 2023-06-16: 5 mL
  Filled 2023-06-16: qty 5

## 2023-06-16 MED ORDER — BENZONATATE 100 MG PO CAPS: 100.0000 mg | ORAL_CAPSULE | Freq: Three times a day (TID) | ORAL | 0 refills | Status: DC | PRN

## 2023-06-16 MED ORDER — AZITHROMYCIN 250 MG PO TABS: ORAL_TABLET | ORAL | 0 refills | Status: DC

## 2023-06-16 MED ORDER — CEFTRIAXONE SODIUM 1 G IJ SOLR
1.0000 g | Freq: Once | INTRAMUSCULAR | Status: AC
  Administered 2023-06-16: 1 g via INTRAMUSCULAR
  Filled 2023-06-16: qty 10

## 2023-06-16 MED ORDER — MELOXICAM 7.5 MG PO TABS
7.5000 mg | ORAL_TABLET | Freq: Every day | ORAL | 0 refills | Status: DC
Start: 2023-06-16 — End: 2023-07-21

## 2023-06-16 NOTE — Progress Notes (Signed)
Required referral for RN care coordinator or PAC-RN related to the following:  06/16/23: [facility] ED [facility] Liaison for [facility] evaluated/screened patient chart for potential or prior engagements pending disposition. Met with patient in ED setting. No current SDOH needs voiced or noted in EMR. Agreeable to [facility] outreach post discharge.   Admitting Dx: Pending Chief Complaint: Presented to ED with cough duration of 1.5 weeks. Covid test pending when spoke with patient in ED setting.  Risk: Low, not calculated in ED, but not a high utilizer with:  1ED/30 Days=todays visit.  1ED/40Mo. 0IP/30D/64M 0/PAC  LOS: <1 day Insurance:[employer] Medicare HMO PCP: Dr. Oliver Barre Practice: [facility], [place] Practice Phone: [phone] Plan/Support: Patient agreeable to outreach by [facility] for any concerns or future needs. Followed by cardiologist for CAD. Htx of CVA/DM. Potential needs for prevention/chronic care management.   Gabriel Cirri RN MSN  [place]  [facility], Population Health Title: ED RN Liaison Email: [email or web address] Direct Dial: Teams chat or secure chat                        Website: [email or web address]

## 2023-06-16 NOTE — ED Provider Notes (Signed)
[facility] Provider Note  Patient Contact: 2:09 PM (approximate)   History   Cough   HPI  Travis Carey is a 82 y.o. male who presents the emergency department complaining of a cough for 2-1/2 weeks.  Patient states that he may have had a fever at the beginning of his symptoms but he has not had any fevers, chills, shortness of breath, chest pain.  Patient is here because he has had ongoing cough x 2 and half weeks.  Patient denies any recent sick contacts.  Does have a history of COPD though this feels different.       Physical Exam   Triage Vital Signs: ED Triage Vitals  Encounter Vitals Group     BP 06/16/23 1328 (!) 161/79     Systolic BP Percentile --      Diastolic BP Percentile --      Pulse Rate 06/16/23 1328 74     Resp 06/16/23 1328 18     Temp 06/16/23 1326 99 F (37.2 C)     Temp Source 06/16/23 1326 Oral     SpO2 06/16/23 1328 94 %     Weight 06/16/23 1326 170 lb (77.1 kg)     Height 06/16/23 1326 5\' 8"  (1.727 m)     Head Circumference --      Peak Flow --      Pain Score 06/16/23 1326 0     Pain Loc --      Pain Education --      Exclude from Growth Chart --     Most recent vital signs: Vitals:   06/16/23 1326 06/16/23 1328  BP:  (!) 161/79  Pulse:  74  Resp:  18  Temp: 99 F (37.2 C)   SpO2:  94%     General: Alert and in no acute distress.   Cardiovascular:  Good peripheral perfusion Respiratory: Normal respiratory effort without tachypnea or retractions. Lungs CTAB. No appreciable rales, rhonchi, wheezing.Good air entry to the bases with no decreased or absent breath sounds.   Musculoskeletal: Full range of motion to all extremities.  Neurologic:  No gross focal neurologic deficits are appreciated.  Skin:   No rash noted Other:   ED Results / Procedures / Treatments   Labs (all labs ordered are listed, but only abnormal results are displayed) Labs Reviewed  RESP PANEL BY RT-PCR (RSV, FLU A&B, COVID)  [id number]      EKG     RADIOLOGY  I personally viewed, evaluated, and interpreted these images as part of my medical decision making, as well as reviewing the written report by the radiologist.  ED Provider Interpretation: Left basilar infiltrate concerning for possible pneumonia.  DG Chest 2 View  Result Date: 06/16/2023 CLINICAL DATA:  Cough for 1-1/2 weeks. EXAM: CHEST - 2 VIEW COMPARISON:  December 04, 2021 FINDINGS: The heart size and mediastinal contours are within normal limits. Mild to moderate severity infiltrate is seen within the left lung base. There is a small left pleural effusion. No pneumothorax is identified. Multilevel degenerative changes seen throughout the thoracic spine. IMPRESSION: Mild to moderate severity left basilar infiltrate with a small left pleural effusion. Electronically Signed   By: Aram Candela M.D.   On: 06/16/2023 15:25    PROCEDURES:  Critical Care performed: No  Procedures   MEDICATIONS ORDERED IN ED: Medications  cefTRIAXone (ROCEPHIN) injection 1 g (has no administration in time range)     IMPRESSION / MDM / ASSESSMENT  AND PLAN / ED COURSE  I reviewed the triage vital signs and the nursing notes.                                 Differential diagnosis includes, but is not limited to, COVID, flu, pneumonia, bronchitis, COPD exacerbation   Patient's presentation is most consistent with acute presentation with potential threat to life or bodily function.   Patient's diagnosis is consistent with community-acquired pneumonia.  Patient presents emergency department complaining of 2 and half weeks of cough.  Patient does have a history of COPD but this feels different than her typical COPD cough.  Patient may have had a fever at the very beginning of symptoms but none in the last week to 2 weeks.  Patient has no chest pain or true shortness of breath.  Has been using over-the-counter medication without relief.  X-ray revealed left basilar  infiltrate concerning for pneumonia.  Patient has reassuring vital signs.  No indication for admission currently.  Patient will have Rocephin, azithromycin at home.  Will prescribe cough medication.  Swab was negative for COVID and flu.  Follow-up with primary care as needed.  Return precautions discussed with patient..  Patient is given ED precautions to return to the ED for any worsening or new symptoms.     FINAL CLINICAL IMPRESSION(S) / ED DIAGNOSES   Final diagnoses:  Community acquired pneumonia of left lower lobe of lung     Rx / DC Orders   ED Discharge Orders          Ordered    azithromycin (ZITHROMAX Z-PAK) 250 MG tablet        06/16/23 1637    benzonatate (TESSALON PERLES) 100 MG capsule  3 times daily PRN        06/16/23 1637             Note:  This document was prepared using Dragon voice recognition software and may include unintentional dictation errors.   Lanette Hampshire 06/16/23 1637    Minna Antis, MD 06/16/23 1924

## 2023-06-16 NOTE — ED Triage Notes (Signed)
Patient to ED via POV for cough x1.5 week. Denies any other symptoms at this time. NAD noted.

## 2023-06-20 NOTE — Group Note (Deleted)

## 2023-06-22 ENCOUNTER — Telehealth: Payer: Self-pay

## 2023-06-22 ENCOUNTER — Telehealth: Payer: Self-pay | Admitting: Internal Medicine

## 2023-06-22 ENCOUNTER — Other Ambulatory Visit: Payer: Self-pay

## 2023-06-22 MED ORDER — AZITHROMYCIN 250 MG PO TABS: ORAL_TABLET | ORAL | 0 refills | Status: DC

## 2023-06-22 NOTE — Transitions of Care (Post Inpatient/ED Visit) (Signed)
06/22/2023  Name: Travis Carey MRN: [id number] DOB: [birth year]  Today's TOC FU Call Status: Today's TOC FU Call Status:: Successful TOC FU Call Completed TOC FU Call Complete Date: 06/22/23 Patient's Name and Date of Birth confirmed.  Red on EMMI-ED Discharge Alert Date & Reason:06/18/23 "Scheduled follow-up appt? No"  Transition Care Management Follow-up Telephone Call Date of Discharge: 06/16/23 Discharge Facility: [facility]) Type of Discharge: Emergency Department Reason for ED Visit: Other: ("CAP of left lower lobe of lung") How have you been since you were released from the hospital?: Better (Spoke w/wife-states pt "doing better-still has an occassionall cough of light yellow sputum at times." He has completed abxs-taking cough med. Denies any SOB or other resp sxs. Appetite good. LBM two days ago.) Any questions or concerns?: Yes Patient Questions/Concerns:: Wife states she tried to get in contact with ED MD to see if pt could get some more abxs but was not able to reach anyone.She has contacted PCP office this AM as well to make request and awaiting a return call. Patient Questions/Concerns Addressed: Other: (Advised wife that once d/c from ED-pt under care of PCP. Advised her that provider would probably want to see/evaluate pt prior to ordering more abxs-offered to assist with making appt during call-she declined-prefers to wait until office calls her back)  Items Reviewed: Did you receive and understand the discharge instructions provided?: Yes Medications obtained,verified, and reconciled?: Yes (Medications Reviewed) Any new allergies since your discharge?: No Dietary orders reviewed?: Yes Type of Diet Ordered:: low salt/heart healthy Do you have support at home?: Yes People in Home: spouse Name of Support/Comfort Primary Source: wife  Medications Reviewed Today: Medications Reviewed Today     Reviewed by Charlyn Minerva, RN  (Registered Nurse) on 06/22/23 at 1011  Med List Status: <None>   Medication Order Taking? Sig Documenting Provider Last Dose Status Informant  amLODipine (NORVASC) 5 MG tablet [id number] Yes Take 1 tablet by mouth once daily Corwin Levins, MD Taking Active   aspirin EC 81 MG tablet [id number] Yes Take 81 mg by mouth daily. [provider] Taking Active Self  azithromycin (ZITHROMAX Z-PAK) 250 MG tablet [id number]  Take 2 tablets (500 mg) on  Day 1,  followed by 1 tablet (250 mg) once daily on Days 2 through 5. Cuthriell, Delorise Royals, PA-C  Active            Med Note Antionette Fairy J   Wed Jun 22, 2023 10:07 AM) Pt/wife reports pt has completed abx therapy  benzonatate (TESSALON PERLES) 100 MG capsule [id number] Yes Take 1 capsule (100 mg total) by mouth 3 (three) times daily as needed for cough. Cuthriell, Delorise Royals, PA-C Taking Active   carvedilol (COREG) 25 MG tablet [id number] Yes TAKE 1 TABLET BY MOUTH TWICE DAILY WITH A MEAL Corwin Levins, MD Taking Active   diclofenac sodium (VOLTAREN) 1 % GEL [id number] Yes Apply 2 g topically 4 (four) times daily as needed. Corwin Levins, MD Taking Active   fexofenadine [facility]) 180 MG tablet [id number] Yes Take 1 tablet (180 mg total) by mouth daily. As needed Corwin Levins, MD Taking Active   fluticasone [facility]) 50 MCG/ACT nasal spray [id number] Yes Place 2 sprays into both nostrils daily. Charlsie Quest, NP Taking Active   furosemide (LASIX) 20 MG tablet [id number] Yes with 3 potassium Hammock, Sheri, NP Taking Active   gabapentin (NEURONTIN) 100 MG capsule [id number] Yes Take 2 capsules (200 mg total) by mouth  3 (three) times daily. Corwin Levins, MD Taking Active   hydrocortisone (ANUSOL-HC) 2.5 % rectal cream [id number] Yes Place 1 Application rectally 2 (two) times daily. Corwin Levins, MD Taking Active   lisinopril-hydrochlorothiazide (ZESTORETIC) 20-12.5 MG tablet [id number] Yes Take 1 tablet by mouth once daily Corwin Levins, MD Taking Active    meloxicam (MOBIC) 7.5 MG tablet [id number] No Take 1 tablet (7.5 mg total) by mouth daily.  Patient not taking: Reported on 06/22/2023   Lanette Hampshire Not Taking Active   MITIGARE 0.6 MG CAPS [id number] Yes Take 1 capsule by mouth once daily Corwin Levins, MD Taking Active   potassium chloride (KLOR-CON) 10 MEQ tablet [id number] Yes TAKE 2 TABLETS BY MOUTH ONCE DAILY TAKE  AN  EXTRA  TABLET  EVERY  OTHER  DAY  WHEN  YOU  TAKE  YOUR  FUROSEMIDE  (LASIX) Gollan, Tollie Pizza, MD Taking Active   predniSONE (DELTASONE) 10 MG tablet [id number] No 3 tabs by mouth per day for 3 days,2tabs per day for 3 days,1tab per day for 3 days  Patient not taking: Reported on 06/02/2023   Corwin Levins, MD Not Taking Active   rosuvastatin (CRESTOR) 10 MG tablet [id number] Yes TAKE 1 TABLET BY MOUTH ONCE DAILY. Corwin Levins, MD Taking Active   tamsulosin [facility]) 0.4 MG CAPS capsule [id number] Yes Take 1 capsule (0.4 mg total) by mouth daily. Corwin Levins, MD Taking Active   traMADol Janean Sark) 50 MG tablet [id number] Yes TAKE 1 TABLET BY MOUTH EVERY 6 HOURS AS NEEDED Corwin Levins, MD Taking Active             Home Care and Equipment/Supplies: Were Home Health Services Ordered?: NA Any new equipment or medical supplies ordered?: NA  Functional Questionnaire: Do you need assistance with bathing/showering or dressing?: No Do you need assistance with meal preparation?: No Do you need assistance with eating?: No Do you have difficulty maintaining continence: No Do you need assistance with getting out of bed/getting out of a chair/moving?: No Do you have difficulty managing or taking your medications?: No  Follow up appointments reviewed: PCP Follow-up appointment confirmed?: No (Wife declined making appt during call-will wait until she hears back from office) MD Provider Line Number:[phone] Given: No Specialist Hospital Follow-up appointment confirmed?: NA Do you need transportation to your  follow-up appointment?: No Do you understand care options if your condition(s) worsen?: Yes-patient verbalized understanding   TOC Interventions Today    Flowsheet Row Most Recent Value  TOC Interventions   TOC Interventions Discussed/Reviewed TOC Interventions Discussed, S/S of infection      Interventions Today    Flowsheet Row Most Recent Value  Chronic Disease   Chronic disease during today's visit Hypertension (HTN)  General Interventions   General Interventions Discussed/Reviewed General Interventions Discussed, Doctor Visits, Durable Medical Equipment (DME)  Doctor Visits Discussed/Reviewed Doctor Visits Discussed, PCP  Durable Medical Equipment (DME) BP Cuff  [wife states they are monitoring BP daily and readings WNLs]  PCP/Specialist Visits Compliance with follow-up visit  Education Interventions   Education Provided Provided Education  Provided Verbal Education On Nutrition, When to see the doctor, Other  [resp sx mgmt, bowel mgmt]  Nutrition Interventions   Nutrition Discussed/Reviewed Nutrition Discussed, Fluid intake  Pharmacy Interventions   Pharmacy Dicussed/Reviewed Pharmacy Topics Discussed, Medications and their functions  Safety Interventions   Safety Discussed/Reviewed Safety Discussed        Antionette Fairy, RN,BSN,CCM [facility] Care Management  Care Management Community Coordinator Direct Phone: [phone] Toll Free: [phone] Fax: [phone]

## 2023-06-22 NOTE — Telephone Encounter (Signed)
Spouse called and would a refill on azithromycin (ZITHROMAX Z-PAK) 250 MG tablet  Please call for further information

## 2023-06-22 NOTE — Telephone Encounter (Signed)
Ok done erx 

## 2023-06-23 ENCOUNTER — Ambulatory Visit: Payer: Medicare HMO

## 2023-07-19 ENCOUNTER — Ambulatory Visit
Admission: RE | Admit: 2023-07-19 | Discharge: 2023-07-19 | Disposition: A | Discharge status: Home / Self Care | Payer: Medicare HMO | Source: Ambulatory Visit | Attending: Orthopedic Surgery | Admitting: Orthopedic Surgery

## 2023-07-19 ENCOUNTER — Ambulatory Visit
Admission: RE | Admit: 2023-07-19 | Discharge: 2023-07-19 | Disposition: A | Discharge status: Home / Self Care | Payer: Medicare HMO | Attending: Orthopedic Surgery | Admitting: Orthopedic Surgery

## 2023-07-19 ENCOUNTER — Other Ambulatory Visit (HOSPITAL_BASED_OUTPATIENT_CLINIC_OR_DEPARTMENT_OTHER): Payer: Self-pay

## 2023-07-19 ENCOUNTER — Encounter: Payer: Self-pay | Admitting: Orthopedic Surgery

## 2023-07-19 DIAGNOSIS — M48061 Spinal stenosis, lumbar region without neurogenic claudication: Secondary | ICD-10-CM | POA: Diagnosis not present

## 2023-07-19 DIAGNOSIS — M545 Low back pain, unspecified: Secondary | ICD-10-CM

## 2023-07-19 DIAGNOSIS — M5116 Intervertebral disc disorders with radiculopathy, lumbar region: Secondary | ICD-10-CM | POA: Diagnosis not present

## 2023-07-19 DIAGNOSIS — M4726 Other spondylosis with radiculopathy, lumbar region: Secondary | ICD-10-CM | POA: Diagnosis not present

## 2023-07-21 ENCOUNTER — Ambulatory Visit: Payer: Medicare HMO

## 2023-07-21 DIAGNOSIS — M5441 Lumbago with sciatica, right side: Secondary | ICD-10-CM | POA: Diagnosis not present

## 2023-07-21 DIAGNOSIS — G8929 Other chronic pain: Secondary | ICD-10-CM | POA: Diagnosis not present

## 2023-07-21 NOTE — Progress Notes (Signed)
New Patient Visit  Assessment: Travis Carey is a 82 y.o. male with the following: 1. Chronic bilateral low back pain with right-sided sciatica  Plan: [name] had pain in the low back, which has bothered him from time to time.  Approximately 2 months ago, his pain got worse, and was excruciating.  He had radiating pains into the right leg.  On exam today, he is not having much pain.  He has good lower body strength.  We reviewed radiographs together which demonstrates some degenerative changes without anterolisthesis.  He is doing much better overall.  Nothing is needed at this time.  I discussed the likely contributing factors to his ongoing pain.  If these recur, I can refer him to a neurosurgeon and/or have him evaluated for lower back injections, which have been effective in the past.  Follow-up: Return if symptoms worsen or fail to improve.  Subjective:  Chief Complaint  Patient presents with   Lower Back - Pain    History of Present Illness: Travis Carey is a 81 y.o. male who has been referred by Oliver Barre, MD for evaluation of right hip pain.  Approximate 2 months ago, he started to have pain in the right side of his lower back and into the buttock, radiating distally.  Since then, it has progressively improved.  Today, he is doing much better.  He has previously had back pain, and required injections.  Approximately 3 years ago, he had injections which were effective.  He does not like to take medications.  He has had massage therapy in the past, which has been helpful.  He feels much better today.   Review of Systems: No fevers or chills No numbness or tingling No chest pain No shortness of breath No bowel or bladder dysfunction No GI distress No headaches   Medical History:  Past Medical History:  Diagnosis Date   Allergic rhinitis, cause unspecified 01/31/2013   ANEMIA-NOS 01/28/2010   BPH (benign prostatic hypertrophy) 04/25/2013   CAD 02/17/2010   CAROTID BRUIT  11/18/2009   Carpal tunnel syndrome 05/20/2008   Cervical radiculitis 04/17/2012   CHEST PAIN 09/26/2009   DEPRESSION 06/12/2007   DYSPNEA 07/28/2010   ERECTILE DYSFUNCTION 02/24/2009   GERD 06/09/2007   GERD (gastroesophageal reflux disease)    GLUCOSE INTOLERANCE 01/28/2010   Gout 2017   Dr.John   HEMORRHOIDS 06/09/2007   History of colonic polyps 08/07/2015   HYPERLIPIDEMIA 02/07/2008   HYPERTENSION 06/09/2007   HYPOKALEMIA 01/28/2010   Impaired glucose tolerance 01/23/2011   LOW BACK PAIN 06/12/2007   MYOCARDIAL PERFUSION SCAN, WITH STRESS TEST, ABNORMAL 10/08/2009   PARESTHESIA 04/25/2008   SHOULDER PAIN, LEFT 04/25/2008   URI 08/19/2008   VITAMIN B12 DEFICIENCY 02/09/2010    Past Surgical History:  Procedure Laterality Date   COLONOSCOPY     CORONARY STENT PLACEMENT     from right radial    Family History  Problem Relation Age of Onset   Hyperlipidemia Mother    Heart disease Mother    Hypertension Mother    Hyperlipidemia Father    Heart disease Father    Hypertension Father    Alcohol abuse Sister        ETOH dependence   Alcohol abuse Brother        ETOH  dependence   Heart disease Brother    Heart attack Other    Stroke Other    Hypertension Other    Coronary artery disease Other  Social History   Tobacco Use   Smoking status: Never   Smokeless tobacco: Never  Vaping Use   Vaping status: Never Used  Substance Use Topics   Alcohol use: No   Drug use: No    Allergies  Allergen Reactions   Diclofenac Sodium     REACTION: maks pt drowsey    Current Meds  Medication Sig   amLODipine (NORVASC) 5 MG tablet Take 1 tablet by mouth once daily   aspirin EC 81 MG tablet Take 81 mg by mouth daily.   azithromycin (ZITHROMAX Z-PAK) 250 MG tablet Take 2 tablets (500 mg) on  Day 1,  followed by 1 tablet (250 mg) once daily on Days 2 through 5.   benzonatate (TESSALON PERLES) 100 MG capsule Take 1 capsule (100 mg total) by mouth 3 (three) times daily as needed for cough.    carvedilol (COREG) 25 MG tablet TAKE 1 TABLET BY MOUTH TWICE DAILY WITH A MEAL   diclofenac sodium (VOLTAREN) 1 % GEL Apply 2 g topically 4 (four) times daily as needed.   fexofenadine (ALLEGRA) 180 MG tablet Take 1 tablet (180 mg total) by mouth daily. As needed   fluticasone (FLONASE) 50 MCG/ACT nasal spray Place 2 sprays into both nostrils daily.   furosemide (LASIX) 20 MG tablet with 3 potassium   gabapentin (NEURONTIN) 100 MG capsule Take 2 capsules (200 mg total) by mouth 3 (three) times daily.   hydrocortisone (ANUSOL-HC) 2.5 % rectal cream Place 1 Application rectally 2 (two) times daily.   lisinopril-hydrochlorothiazide (ZESTORETIC) 20-12.5 MG tablet Take 1 tablet by mouth once daily   MITIGARE 0.6 MG CAPS Take 1 capsule by mouth once daily   potassium chloride (KLOR-CON) 10 MEQ tablet TAKE 2 TABLETS BY MOUTH ONCE DAILY TAKE  AN  EXTRA  TABLET  EVERY  OTHER  DAY  WHEN  YOU  TAKE  YOUR  FUROSEMIDE  (LASIX)   rosuvastatin (CRESTOR) 10 MG tablet TAKE 1 TABLET BY MOUTH ONCE DAILY.   tamsulosin (FLOMAX) 0.4 MG CAPS capsule Take 1 capsule (0.4 mg total) by mouth daily.   traMADol (ULTRAM) 50 MG tablet TAKE 1 TABLET BY MOUTH EVERY 6 HOURS AS NEEDED    Objective: There were no vitals taken for this visit.  Physical Exam:  General: Elderly male., Alert and oriented., and No acute distress. Gait: Slow, steady gait.  Lower back without deformity.  No redness.  No point tenderness.  Negative straight leg raise bilaterally.  He has excellent bilateral lower extremity strength.  Intact sensation in his lower extremities.  IMAGING: I personally ordered and reviewed the following images  X-ray of the lumbar spine were obtained prior to clinic today.  No anterolisthesis.  Degenerative changes, more so within the thoracolumbar junction.  There are anterior osteophytes.  Well-maintained disc height.   New Medications:  No orders of the defined types were placed in this  encounter.     Oliver Barre, MD  07/21/2023 11:07 AM

## 2023-08-26 ENCOUNTER — Other Ambulatory Visit: Payer: Self-pay

## 2023-08-26 ENCOUNTER — Other Ambulatory Visit: Payer: Self-pay | Admitting: Internal Medicine

## 2023-09-06 ENCOUNTER — Ambulatory Visit: Payer: Medicare HMO | Attending: Cardiology | Admitting: Cardiology

## 2023-09-06 NOTE — Progress Notes (Deleted)
Cardiology Office Note:  .   Date:  09/06/2023  ID:  Travis Carey, DOB [birth year], MRN [id number] PCP: Corwin Levins, MD  [facility] Providers Cardiologist:  Julien Nordmann, MD { Click to update primary MD,subspecialty MD or APP then REFRESH:1}   History of Present Illness: .   Travis Carey is a 82 y.o. male with a past medical history of coronary disease with stenting to the RCA (10/2009), TIA/CVA (03/2015), type 2 diabetes, hyperlipidemia, peripheral arterial disease with claudication, hypertension, bilateral carotid artery stenosis, COPD, GERD, who is here today for follow-up.   Previously undergone left heart catheterization after having continued complaints of unstable angina.  He had stent to the RCA in 10/31/2009.  TIA/CVA in 03/2015 in the setting of working in the heat.  Echocardiogram revealed left ventricular septal hypertrophy with gradient of 37 mm with Valsalva 2016.  Condition at home clinic repeated echo in 2020 that showed moderate MR, severe LVH, and HCM.  He underwent stress testing in May 2023 that was considered low risk with no significant ischemia.  ABIs were also completed for claudication which resulted as normal.   Seen in clinic 06/02/2023 stating from a cardiac perspective overall he was doing well.  He had previously finished antibiotics for an upper respiratory infection.  He was sent for follow-up labs.  No further testing was ordered.  She returns to clinic today  ROS: 10 point review of systems has been reviewed and considered negative with exception was been listed in the HPI  Studies Reviewed: Marland Kitchen        TTE 04/07/23  1. Left ventricular ejection fraction, by estimation, is 60 to 65%. The  left ventricle has normal function. The left ventricle has no regional  wall motion abnormalities. There is moderate left ventricular hypertrophy.  No significanct LVOT gradient Left  ventricular diastolic parameters were indeterminate. The average left  ventricular  global longitudinal strain is -13.8 %.   2. Right ventricular systolic function is normal. The right ventricular  size is normal. Tricuspid regurgitation signal is inadequate for assessing  PA pressure.   3. Left atrial size was severely dilated.   4. The mitral valve is normal in structure. Moderate mitral valve  regurgitation. No evidence of mitral stenosis.   5. The aortic valve has an indeterminant number of cusps. Aortic valve  regurgitation is mild. No aortic stenosis is present.   6. The inferior vena cava is normal in size with greater than 50%  respiratory variability, suggesting right atrial pressure of 3 mmHg.    Lexiscan MPI 02/16/22   Normal pharmacologic myocardial perfusion stress test without evidence of significant ischemia or scar.   Low normal left ventricular systolic function (LVEF 50-55%).   Coronary artery calcification, RCA stent, and aortic atherosclerosis are noted.   Incidental note is made of small left and tiny right pleural effusions.   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   This is a low risk study. Risk Assessment/Calculations:     No BP recorded.  {Refresh Note OR Click here to enter BP  :1}***       Physical Exam:   VS:  There were no vitals taken for this visit.   Wt Readings from Last 3 Encounters:  06/16/23 170 lb (77.1 kg)  06/02/23 195 lb 3.2 oz (88.5 kg)  05/10/23 200 lb (90.7 kg)    GEN: Well nourished, well developed in no  acute distress NECK: No JVD; No carotid bruits CARDIAC: ***RRR, no murmurs, rubs, gallops RESPIRATORY:  Clear to auscultation without rales, wheezing or rhonchi  ABDOMEN: Soft, non-tender, non-distended EXTREMITIES:  No edema; No deformity   ASSESSMENT AND PLAN: .   ***    {Are you ordering a CV Procedure (e.g. stress test, cath, DCCV, TEE, etc)?   Press F2        :[id number]}  Dispo: ***  Signed, Collene Massimino, NP

## 2023-10-05 ENCOUNTER — Other Ambulatory Visit: Payer: Self-pay | Admitting: Cardiovascular Disease

## 2023-10-06 MED ORDER — POTASSIUM CHLORIDE ER 10 MEQ PO TBCR: 20.0000 meq | EXTENDED_RELEASE_TABLET | Freq: Every day | ORAL | 2 refills | Status: DC

## 2023-10-06 NOTE — Telephone Encounter (Signed)
Please contact pt for future appointment. Pt due for 3 month f/u with Charlsie Quest, NP

## 2023-10-18 ENCOUNTER — Ambulatory Visit (INDEPENDENT_AMBULATORY_CARE_PROVIDER_SITE_OTHER): Payer: 59 | Admitting: Internal Medicine

## 2023-10-18 ENCOUNTER — Encounter: Payer: Self-pay | Admitting: Internal Medicine

## 2023-10-18 VITALS — BP 122/72 | HR 55 | Temp 98.4°F | Ht 68.0 in | Wt 190.0 lb

## 2023-10-18 DIAGNOSIS — E559 Vitamin D deficiency, unspecified: Secondary | ICD-10-CM | POA: Diagnosis not present

## 2023-10-18 DIAGNOSIS — Z0001 Encounter for general adult medical examination with abnormal findings: Secondary | ICD-10-CM

## 2023-10-18 DIAGNOSIS — K648 Other hemorrhoids: Secondary | ICD-10-CM

## 2023-10-18 DIAGNOSIS — E1165 Type 2 diabetes mellitus with hyperglycemia: Secondary | ICD-10-CM | POA: Diagnosis not present

## 2023-10-18 DIAGNOSIS — J441 Chronic obstructive pulmonary disease with (acute) exacerbation: Secondary | ICD-10-CM | POA: Diagnosis not present

## 2023-10-18 DIAGNOSIS — J309 Allergic rhinitis, unspecified: Secondary | ICD-10-CM

## 2023-10-18 DIAGNOSIS — E538 Deficiency of other specified B group vitamins: Secondary | ICD-10-CM | POA: Diagnosis not present

## 2023-10-18 DIAGNOSIS — I10 Essential (primary) hypertension: Secondary | ICD-10-CM

## 2023-10-18 DIAGNOSIS — E782 Mixed hyperlipidemia: Secondary | ICD-10-CM | POA: Diagnosis not present

## 2023-10-18 DIAGNOSIS — J329 Chronic sinusitis, unspecified: Secondary | ICD-10-CM

## 2023-10-18 LAB — VITAMIN D 25 HYDROXY (VIT D DEFICIENCY, FRACTURES): VITD: 52.57 ng/mL (ref 30.00–100.00)

## 2023-10-18 LAB — MICROALBUMIN / CREATININE URINE RATIO
Creatinine,U: 164.7 mg/dL
Microalb Creat Ratio: 1.8 mg/g (ref 0.0–30.0)
Microalb, Ur: 3 mg/dL — ABNORMAL HIGH (ref 0.0–1.9)

## 2023-10-18 LAB — LIPID PANEL
Cholesterol: 135 mg/dL (ref 0–200)
HDL: 35.4 mg/dL — ABNORMAL LOW (ref >39.00)
LDL Cholesterol: 74 mg/dL (ref 0–99)
NonHDL: 99.86
Total CHOL/HDL Ratio: 4
Triglycerides: 130 mg/dL (ref 0.0–149.0)
VLDL: 26 mg/dL (ref 0.0–40.0)

## 2023-10-18 LAB — URINALYSIS, ROUTINE W REFLEX MICROSCOPIC
Bilirubin Urine: NEGATIVE
Hgb urine dipstick: NEGATIVE
Ketones, ur: NEGATIVE
Nitrite: NEGATIVE
Specific Gravity, Urine: 1.015 (ref 1.000–1.030)
Total Protein, Urine: NEGATIVE
Urine Glucose: NEGATIVE
Urobilinogen, UA: 0.2 (ref 0.0–1.0)
pH: 6 (ref 5.0–8.0)

## 2023-10-18 LAB — BASIC METABOLIC PANEL
BUN: 13 mg/dL (ref 6–23)
CO2: 31 meq/L (ref 19–32)
Calcium: 10.1 mg/dL (ref 8.4–10.5)
Chloride: 106 meq/L (ref 96–112)
Creatinine, Ser: 1.09 mg/dL (ref 0.40–1.50)
GFR: 63.51 mL/min (ref >60.00)
Glucose, Bld: 90 mg/dL (ref 70–99)
Potassium: 3.7 meq/L (ref 3.5–5.1)
Sodium: 144 meq/L (ref 135–145)

## 2023-10-18 LAB — HEMOGLOBIN A1C: Hgb A1c MFr Bld: 6.3 % (ref 4.6–6.5)

## 2023-10-18 LAB — TSH: TSH: 2.71 u[IU]/mL (ref 0.35–5.50)

## 2023-10-18 LAB — VITAMIN B12: Vitamin B-12: >1537 pg/mL — ABNORMAL HIGH (ref 211–911)

## 2023-10-18 MED ORDER — HYDROCODONE BIT-HOMATROP MBR 5-1.5 MG/5ML PO SOLN: 5.0000 mL | Freq: Four times a day (QID) | ORAL | 0 refills | Status: AC | PRN

## 2023-10-18 MED ORDER — ALBUTEROL SULFATE HFA 108 (90 BASE) MCG/ACT IN AERS: 2.0000 | INHALATION_SPRAY | Freq: Four times a day (QID) | RESPIRATORY_TRACT | 5 refills | Status: DC | PRN

## 2023-10-18 MED ORDER — PREDNISONE 10 MG PO TABS: ORAL_TABLET | ORAL | 0 refills | Status: DC

## 2023-10-18 MED ORDER — DOXYCYCLINE HYCLATE 100 MG PO TABS: 100.0000 mg | ORAL_TABLET | Freq: Two times a day (BID) | ORAL | 0 refills | Status: DC

## 2023-10-18 NOTE — Assessment & Plan Note (Addendum)
 Mild, for prednisone taper asd, albuterol hfa prn, to f/u any worsening symptoms or concerns

## 2023-10-18 NOTE — Patient Instructions (Addendum)
 Please take all new medication as prescribed- the antibiotic, cough medicine, prednisone , and inhaler as needed  Please continue all other medications as before, and refills have been done if requested.  Please have the pharmacy call with any other refills you may need.  Please continue your efforts at being more active, low cholesterol diet, and weight control.  You are otherwise up to date with prevention measures today.  Please keep your appointments with your specialists as you may have planned  You will be contacted regarding the referral for: Gastroenterology  Please go to the LAB at the blood drawing area for the tests to be done  You will be contacted by phone if any changes need to be made immediately.  Otherwise, you will receive a letter about your results with an explanation, but please check with MyChart first.  Please make an Appointment to return in 6 months, or sooner if needed

## 2023-10-18 NOTE — Assessment & Plan Note (Signed)
Last vitamin D Lab Results  Component Value Date   VD25OH 25.22 (L) 10/27/2022   Low, to start oral replacement

## 2023-10-18 NOTE — Assessment & Plan Note (Signed)
Lab Results  Component Value Date   VITAMINB12 >1500 (H) 10/27/2022   Stable, cont oral replacement - b12 1000 mcg qd  

## 2023-10-18 NOTE — Assessment & Plan Note (Signed)
 Mild intermittent, for GI referral

## 2023-10-18 NOTE — Progress Notes (Signed)
 Patient ID: Travis Carey, male   DOB: [birth year], 83 y.o.   MRN: [id number]         Chief Complaint:: wellness exam and Medical Management of Chronic Issues (6 month follow up, chest congestion for about a month says has fully cleared up after having pneumonia , dryness in ears )  And sinusitis/uri, allergies and wheezing, hemorrhoidal pain, hld       HPI:  Travis Carey is a 83 y.o. male here for wellness exam; plans to f/u with eye doctor soon, declines covid booster, o/w up to date                        Also with co hemorrhoid pain mild intermittent., hoping for eval and tx.  Pt denies chest pain, increased sob or doe, wheezing, orthopnea, PND, increased LE swelling, palpitations, dizziness or syncope.   Pt denies polydipsia, polyuria, or new focal neuro s/s.    Pt denies fever, wt loss, night sweats, loss of appetite, or other constitutional symptoms.   Here with 2-3 days acute onset fever, facial pain, pressure, headache, general weakness and malaise, and greenish d/c, with mild ST and cough.  Does have several wks ongoing nasal allergy symptoms with clearish congestion, itch and sneezing,but has had also mild sob wheezing in the past 1-2 days.    Wt Readings from Last 3 Encounters:  10/18/23 190 lb (86.2 kg)  06/16/23 170 lb (77.1 kg)  06/02/23 195 lb 3.2 oz (88.5 kg)   BP Readings from Last 3 Encounters:  10/18/23 122/72  06/16/23 (!) 161/79  06/02/23 (!) 162/75   Immunization History  Administered Date(s) Administered   Fluad Quad(high Dose 65+) 10/29/2022, 08/26/2023   Influenza Split 07/27/2011   Influenza Whole 08/09/1997, 08/28/2009, 07/28/2010   Influenza, High Dose Seasonal PF 08/11/2017, 08/17/2018, 07/23/2019, 07/23/2019   Influenza,inj,Quad PF,6+ Mos 06/12/2014, 08/07/2015   Influenza-Unspecified 06/11/2013, 06/12/2014, 08/07/2015, 07/13/2021   PFIZER Comirnaty(Gray Top)Covid-19 Tri-Sucrose Vaccine 02/05/2020, 02/26/2020   PFIZER(Purple Top)SARS-COV-2 Vaccination  01/30/2020, 10/01/2020, 04/22/2022   Pneumococcal Conjugate-13 11/01/2013   Pneumococcal Polysaccharide-23 08/19/2008   Td 08/19/2008   Tdap 12/07/2018   Zoster Recombinant(Shingrix) 04/20/2022, 10/29/2022   Zoster, Live 01/31/2013   Health Maintenance Due  Topic Date Due   HEMOGLOBIN A1C  04/27/2023   COVID-19 Vaccine (6 - 2024-25 season) 06/12/2023   OPHTHALMOLOGY EXAM  06/29/2023   Diabetic kidney evaluation - Urine ACR  10/28/2023      Past Medical History:  Diagnosis Date   Allergic rhinitis, cause unspecified 01/31/2013   ANEMIA-NOS 01/28/2010   BPH (benign prostatic hypertrophy) 04/25/2013   CAD 02/17/2010   CAROTID BRUIT 11/18/2009   Carpal tunnel syndrome 05/20/2008   Cervical radiculitis 04/17/2012   CHEST PAIN 09/26/2009   DEPRESSION 06/12/2007   DYSPNEA 07/28/2010   ERECTILE DYSFUNCTION 02/24/2009   GERD 06/09/2007   GERD (gastroesophageal reflux disease)    GLUCOSE INTOLERANCE 01/28/2010   Gout 2017   Dr.Jennah Satchell   HEMORRHOIDS 06/09/2007   History of colonic polyps 08/07/2015   HYPERLIPIDEMIA 02/07/2008   HYPERTENSION 06/09/2007   HYPOKALEMIA 01/28/2010   Impaired glucose tolerance 01/23/2011   LOW BACK PAIN 06/12/2007   MYOCARDIAL PERFUSION SCAN, WITH STRESS TEST, ABNORMAL 10/08/2009   PARESTHESIA 04/25/2008   SHOULDER PAIN, LEFT 04/25/2008   URI 08/19/2008   VITAMIN B12 DEFICIENCY 02/09/2010   Past Surgical History:  Procedure Laterality Date   COLONOSCOPY     CORONARY STENT PLACEMENT     from  right radial    reports that he has never smoked. He has never used smokeless tobacco. He reports that he does not drink alcohol and does not use drugs. family history includes Alcohol abuse in his brother and sister; Coronary artery disease in an other family member; Heart attack in an other family member; Heart disease in his brother, father, and mother; Hyperlipidemia in his father and mother; Hypertension in his father, mother, and another family member; Stroke in an other family  member. Allergies  Allergen Reactions   Diclofenac  Sodium     REACTION: maks pt drowsey   Current Outpatient Medications on File Prior to Visit  Medication Sig Dispense Refill   amLODipine  (NORVASC ) 5 MG tablet Take 1 tablet by mouth once daily 90 tablet 3   aspirin  EC 81 MG tablet Take 81 mg by mouth daily.     azithromycin  (ZITHROMAX  Z-PAK) 250 MG tablet Take 2 tablets (500 mg) on  Day 1,  followed by 1 tablet (250 mg) once daily on Days 2 through 5. 6 each 0   benzonatate  (TESSALON  PERLES) 100 MG capsule Take 1 capsule (100 mg total) by mouth 3 (three) times daily as needed for cough. 30 capsule 0   carvedilol  (COREG ) 25 MG tablet TAKE 1 TABLET BY MOUTH TWICE DAILY WITH A MEAL 180 tablet 3   diclofenac  sodium (VOLTAREN ) 1 % GEL Apply 2 g topically 4 (four) times daily as needed. 100 g 5   fexofenadine  (ALLEGRA ) 180 MG tablet Take 1 tablet (180 mg total) by mouth daily. As needed 90 tablet 3   fluticasone  (FLONASE ) 50 MCG/ACT nasal spray Place 2 sprays into both nostrils daily. 11.1 mL 2   furosemide  (LASIX ) 20 MG tablet with 3 potassium 90 tablet 3   gabapentin  (NEURONTIN ) 100 MG capsule Take 2 capsules (200 mg total) by mouth 3 (three) times daily. 180 capsule 1   hydrocortisone  (ANUSOL -HC) 2.5 % rectal cream Place 1 Application rectally 2 (two) times daily. 30 g 1   lisinopril -hydrochlorothiazide  (ZESTORETIC ) 20-12.5 MG tablet Take 1 tablet by mouth once daily 90 tablet 2   MITIGARE  0.6 MG CAPS Take 1 capsule by mouth once daily 90 capsule 0   potassium chloride  (KLOR-CON ) 10 MEQ tablet Take 2 tablets (20 mEq total) by mouth daily. Take an extra tablet every other day when you take your furosemide  (Lasix ) 225 tablet 2   rosuvastatin  (CRESTOR ) 10 MG tablet TAKE 1 TABLET BY MOUTH ONCE DAILY. 90 tablet 2   tamsulosin  (FLOMAX ) 0.4 MG CAPS capsule Take 1 capsule by mouth once daily 90 capsule 3   traMADol  (ULTRAM ) 50 MG tablet TAKE 1 TABLET BY MOUTH EVERY 6 HOURS AS NEEDED 60 tablet 2   No  current facility-administered medications on file prior to visit.        ROS:  All others reviewed and negative.  Objective        PE:  BP 122/72 (BP Location: Right Arm, Patient Position: Sitting, Cuff Size: Normal)   Pulse (!) 55   Temp 98.4 F (36.9 C) (Oral)   Ht 5' 8 (1.727 m)   Wt 190 lb (86.2 kg)   SpO2 98%   BMI 28.89 kg/m                 Constitutional: Pt appears in NAD               HENT: Head: NCAT.  Right Ear: External ear normal.                 Left Ear: External ear normal. Bilat tm's with mild erythema.  Max sinus areas mild tender.  Pharynx with mild erythema, no exudate               Eyes: . Pupils are equal, round, and reactive to light. Conjunctivae and EOM are normal               Nose: without d/c or deformity               Neck: Neck supple. Gross normal ROM               Cardiovascular: Normal rate and regular rhythm.                 Pulmonary/Chest: Effort normal and breath sounds without rales but few bilateral wheezing.                Abd:  Soft, NT, ND, + BS, no organomegaly               Neurological: Pt is alert. At baseline orientation, motor grossly intact               Skin: Skin is warm. No rashes, no other new lesions, LE edema - trace bilatearl               Psychiatric: Pt behavior is normal without agitation   Micro: none  Cardiac tracings I have personally interpreted today:  none  Pertinent Radiological findings (summarize): none   Lab Results  Component Value Date   WBC 6.2 06/02/2023   HGB 11.8 (L) 06/02/2023   HCT 36.9 (L) 06/02/2023   PLT 284 06/02/2023   GLUCOSE 115 (H) 06/02/2023   CHOL 127 10/27/2022   TRIG 154.0 (H) 10/27/2022   HDL 29.40 (L) 10/27/2022   LDLDIRECT 58.0 04/26/2022   LDLCALC 67 10/27/2022   ALT 14 06/02/2023   AST 23 06/02/2023   NA 144 06/02/2023   K 4.2 06/02/2023   CL 106 06/02/2023   CREATININE 1.23 06/02/2023   BUN 17 06/02/2023   CO2 27 06/02/2023   TSH 2.46 10/27/2022   PSA  0.38 04/27/2021   INR 1.0 ratio 10/23/2009   HGBA1C 6.4 10/27/2022   MICROALBUR 1.5 10/27/2022   Assessment/Plan:  Travis Carey is a 83 y.o. Black or African American [2] male with  has a past medical history of Allergic rhinitis, cause unspecified (01/31/2013), ANEMIA-NOS (01/28/2010), BPH (benign prostatic hypertrophy) (04/25/2013), CAD (02/17/2010), CAROTID BRUIT (11/18/2009), Carpal tunnel syndrome (05/20/2008), Cervical radiculitis (04/17/2012), CHEST PAIN (09/26/2009), DEPRESSION (06/12/2007), DYSPNEA (07/28/2010), ERECTILE DYSFUNCTION (02/24/2009), GERD (06/09/2007), GERD (gastroesophageal reflux disease), GLUCOSE INTOLERANCE (01/28/2010), Gout (2017), HEMORRHOIDS (06/09/2007), History of colonic polyps (08/07/2015), HYPERLIPIDEMIA (02/07/2008), HYPERTENSION (06/09/2007), HYPOKALEMIA (01/28/2010), Impaired glucose tolerance (01/23/2011), LOW BACK PAIN (06/12/2007), MYOCARDIAL PERFUSION SCAN, WITH STRESS TEST, ABNORMAL (10/08/2009), PARESTHESIA (04/25/2008), SHOULDER PAIN, LEFT (04/25/2008), URI (08/19/2008), and VITAMIN B12 DEFICIENCY (02/09/2010).  Encounter for well adult exam with abnormal findings Age and sex appropriate education and counseling updated with regular exercise and diet Referrals for preventative services - pt will call for eye doctor exam soon Immunizations addressed - declines covid booster Smoking counseling  - none needed Evidence for depression or other mood disorder - none significant Most recent labs reviewed. I have personally reviewed and have noted: 1) the patient's medical and social history 2) The patient's current medications  and supplements 3) The patient's height, weight, and BMI have been recorded in the chart   Hyperlipidemia Lab Results  Component Value Date   LDLCALC 67 10/27/2022   Stable, pt to continue current statin crestor  10 mg per day   B12 deficiency Lab Results  Component Value Date   VITAMINB12 >1500 (H) 10/27/2022   Stable, cont oral replacement - b12  1000 mcg qd   Hypertension BP Readings from Last 3 Encounters:  10/18/23 122/72  06/16/23 (!) 161/79  06/02/23 (!) 162/75   Stable, pt to continue medical treatment norvasc  5 every day, coreg  25 bid   Allergic rhinitis Mild to mod, for prednisone  taper asd,  to f/u any worsening symptoms or concerns   Diabetes (HCC) Lab Results  Component Value Date   HGBA1C 6.4 10/27/2022   Stable, pt to continue current medical treatment  - diet, wt control   Vitamin D  deficiency Last vitamin D  Lab Results  Component Value Date   VD25OH 25.22 (L) 10/27/2022   Low, to start oral replacement   Sinusitis Mild to mod, for antibx course doxycycline  100 bid,  to f/u any worsening symptoms or concerns  Internal hemorrhoid, bleeding Mild intermittent, for GI referral  COPD exacerbation (HCC) Mild, for prednisone  taper asd, albuterol  hfa prn, to f/u any worsening symptoms or concerns  Followup: No follow-ups on file.  Lynwood Rush, MD 10/18/2023 1:14 PM [facility]

## 2023-10-18 NOTE — Assessment & Plan Note (Signed)
Mild to mod, for prednisone taper asd, to f/u any worsening symptoms or concerns

## 2023-10-18 NOTE — Assessment & Plan Note (Signed)
 Mild to mod, for antibx course doxycycline 100 bid,  to f/u any worsening symptoms or concerns

## 2023-10-18 NOTE — Assessment & Plan Note (Signed)
 Age and sex appropriate education and counseling updated with regular exercise and diet Referrals for preventative services - pt will call for eye doctor exam soon Immunizations addressed - declines covid booster Smoking counseling  - none needed Evidence for depression or other mood disorder - none significant Most recent labs reviewed. I have personally reviewed and have noted: 1) the patient's medical and social history 2) The patient's current medications and supplements 3) The patient's height, weight, and BMI have been recorded in the chart

## 2023-10-18 NOTE — Assessment & Plan Note (Signed)
 BP Readings from Last 3 Encounters:  10/18/23 122/72  06/16/23 (!) 161/79  06/02/23 (!) 162/75   Stable, pt to continue medical treatment norvasc 5 every day, coreg 25 bid

## 2023-10-18 NOTE — Assessment & Plan Note (Signed)
Lab Results  Component Value Date   HGBA1C 6.4 10/27/2022   Stable, pt to continue current medical treatment - diet, wt control  

## 2023-10-18 NOTE — Assessment & Plan Note (Signed)
 Lab Results  Component Value Date   LDLCALC 67 10/27/2022   Stable, pt to continue current statin crestor 10 mg per day

## 2023-10-19 ENCOUNTER — Telehealth: Payer: Self-pay | Admitting: Internal Medicine

## 2023-10-19 ENCOUNTER — Encounter: Payer: Self-pay | Admitting: Internal Medicine

## 2023-10-19 NOTE — Telephone Encounter (Signed)
 Copied from CRM [id number]. Topic: Clinical - Prescription Issue >> Oct 19, 2023 12:33 PM Deaijah H wrote: Reason for CRM: Patient wife called in stating that the medication wasn't sent to updated insurance which is [facility] member ID [id number] / Did try to ask which medication wife did not know, does show multiple in chart / please call [phone]

## 2023-10-20 NOTE — Telephone Encounter (Signed)
 Called and spoke with pt letting her know that's a issue for the pharmacy , she states she has contacted them.

## 2023-10-21 ENCOUNTER — Telehealth: Payer: Self-pay | Admitting: Internal Medicine

## 2023-10-21 NOTE — Telephone Encounter (Signed)
 Patient/patient representative is calling in regards to prior auth being sent to [facility] and insurance via fa due to the [employer] financial in pharmacy system for insurance, medications are predniSONE  (DELTASONE ) 10 MG tablet, albuterol  (VENTOLIN  HFA) 108 (90 Base) MCG/ACT inhaler, doxycycline  (VIBRA -TABS) 100 MG tablet and HYDROcodone  bit-homatropine (HYCODAN) 5-1.5 MG/5ML syrup. Advised patient to check back in to see if prior auths could be sent via fax

## 2023-10-21 NOTE — Telephone Encounter (Signed)
 Insurance company is asking if the prior authorization for the following medicines could be sent to them via fax as they had a glitch in the system, the medications are as followed:    doxycycline  (VIBRA -TABS) 100 MG tablet ,   HYDROcodone  bit-homatropine (HYCODAN) 5-1.5 MG/5ML syrup ,   predniSONE  (DELTASONE ) 10 MG tablet ,   albuterol  (VENTOLIN  HFA) 108 (90 Base) MCG/ACT inhaler

## 2023-10-24 ENCOUNTER — Other Ambulatory Visit: Payer: Self-pay | Admitting: Internal Medicine

## 2023-10-25 ENCOUNTER — Other Ambulatory Visit: Payer: Self-pay

## 2023-10-25 ENCOUNTER — Other Ambulatory Visit (HOSPITAL_COMMUNITY): Payer: Self-pay

## 2023-10-28 NOTE — Telephone Encounter (Signed)
Called and Pt wife states he has his medication.

## 2023-10-31 ENCOUNTER — Telehealth: Payer: Self-pay | Admitting: Internal Medicine

## 2023-10-31 NOTE — Telephone Encounter (Signed)
Copied from CRM [id number]. Topic: Clinical - Medication Question >> Oct 31, 2023 12:02 PM Fredrich Romans wrote: Reason for CRM: patient wife would like to know if he could have a refill of medication  doxycycline (VIBRA-TABS) 100 MG tablet. I asked his wife is he still sick or still having symptoms? She stated that he just still has a little congestion,but no cough.But this medicine has helped him a lot since he was prescribed it on 10/18/23,and she would just like a little more to get rid of the little bit of congestion that he is still having.

## 2023-10-31 NOTE — Telephone Encounter (Signed)
Ok to try to reassure the family - some cough is expected up to several wks with some irritation left over from the infection.  We should hold on further antibiotic for now if not having worsening productive cough, fever, shortness of breath or worsening pain.    thanks

## 2023-11-01 ENCOUNTER — Ambulatory Visit: Payer: 59 | Attending: Cardiology | Admitting: Cardiology

## 2023-11-01 NOTE — Progress Notes (Deleted)
Cardiology Office Note:  .   Date:  11/01/2023  ID:  Travis Carey, DOB 16-Sep-1941, MRN [id number] PCP: Corwin Levins, MD  [facility] Providers Cardiologist:  Julien Nordmann, MD { Click to update primary MD,subspecialty MD or APP then REFRESH:1}   History of Present Illness: .   Travis Carey is a 83 y.o. 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   Patient previously underwent left heart catheterization after having continued complaints of unstable angina.  He had stenting to the RCA on 10/31/2009.  He had a TIA/CVA on 03/2015 in the setting of working in the heat.  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  Congenital clinic repeated echo in 2020 that showed moderate MR, severe LVH, and HCM.  He underwent stress testing in May 2023 that was considered low risk with no significant ischemia.  ABIs were completed for claudication which revealed is normal.  He was last seen in clinic 06/02/23 was overall doing well.  He had stated that he had had a chest cold.  Denies any hospitalizations or visits to the emergency department.  Blood pressure was suboptimally controlled.  He was sent for follow-up blood work for [facility] and CBC.  He was also ordered a repeat carotid duplex.   He was evaluated in the emergency department 06/16/2023 for cough.  He is complaining of cough for 2 and half weeks.  Stated he may have had a fever in the beginning but his symptoms has progressed and he has not had any further fevers.  Blood pressure continued to be suboptimally controlled with a blood pressure 161/79.  X-ray revealed mild to moderate severity left basilar infiltrate with a small left pleural  effusion.  He was treated with Rocephin 1 g as his diagnosis is consistent with community-acquired pneumonia.  COVID/influenza and RSV swabs were negative.  He was discharged with antibiotics and Tessalon Perles for cough.  He returns to clinic today  ROS: 10 point review of system has been reviewed and considered negative except what is been listed in the HPI  Studies Reviewed: Marland Kitchen        TTE 04/07/23  1. Left ventricular ejection fraction, by estimation, is 60 to 65%. The  left ventricle has normal function. The left ventricle has no regional  wall motion abnormalities. There is moderate left ventricular hypertrophy.  No significanct LVOT gradient Left  ventricular diastolic parameters were indeterminate. The average left  ventricular global longitudinal strain is -13.8 %.   2. Right ventricular systolic function is normal. The right ventricular  size is normal. Tricuspid regurgitation signal is inadequate for assessing  PA pressure.   3. Left atrial size was severely dilated.   4. The mitral valve is normal in structure. Moderate mitral valve  regurgitation. No evidence of mitral stenosis.   5. The aortic valve has an indeterminant number of cusps. Aortic valve  regurgitation is mild. No aortic stenosis is present.   6. The inferior vena cava is normal in size with greater than 50%  respiratory variability, suggesting right atrial pressure of 3 mmHg.    Lexiscan MPI 02/16/22   Normal pharmacologic myocardial perfusion stress test without evidence of significant ischemia or scar.   Low normal left ventricular systolic function (  LVEF 50-55%).   Coronary artery calcification, RCA stent, and aortic atherosclerosis are noted.   Incidental note is made of small left and tiny right pleural effusions.   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   This is a low risk study. Risk Assessment/Calculations:      No BP recorded.  {Refresh Note OR Click here to enter BP  :1}***       Physical Exam:   VS:  There were no vitals taken for this visit.   Wt Readings from Last 3 Encounters:  10/18/23 190 lb (86.2 kg)  06/16/23 170 lb (77.1 kg)  06/02/23 195 lb 3.2 oz (88.5 kg)    GEN: Well nourished, well developed in no acute distress NECK: No JVD; No carotid bruits CARDIAC: ***RRR, no murmurs, rubs, gallops RESPIRATORY:  Clear to auscultation without rales, wheezing or rhonchi  ABDOMEN: Soft, non-tender, non-distended EXTREMITIES:  No edema; No deformity   ASSESSMENT AND PLAN: .   ***    {Are you ordering a CV Procedure (e.g. stress test, cath, DCCV, TEE, etc)?   Press F2        :[id number]}  Dispo: ***  Signed, Glee Lashomb, NP

## 2023-11-02 NOTE — Telephone Encounter (Signed)
Spoke with Pt wife she states understanding.

## 2023-11-02 NOTE — Telephone Encounter (Signed)
Called and left voice mail

## 2023-11-13 ENCOUNTER — Other Ambulatory Visit: Payer: Self-pay | Admitting: Internal Medicine

## 2023-11-23 ENCOUNTER — Other Ambulatory Visit: Payer: Self-pay

## 2023-11-23 ENCOUNTER — Other Ambulatory Visit: Payer: Self-pay | Admitting: Internal Medicine

## 2023-12-13 DIAGNOSIS — R062 Wheezing: Secondary | ICD-10-CM | POA: Diagnosis not present

## 2023-12-13 DIAGNOSIS — J986 Disorders of diaphragm: Secondary | ICD-10-CM | POA: Diagnosis not present

## 2023-12-13 DIAGNOSIS — R0602 Shortness of breath: Secondary | ICD-10-CM | POA: Diagnosis not present

## 2023-12-13 DIAGNOSIS — J90 Pleural effusion, not elsewhere classified: Secondary | ICD-10-CM | POA: Diagnosis not present

## 2023-12-16 ENCOUNTER — Ambulatory Visit: Payer: Self-pay | Admitting: Internal Medicine

## 2023-12-16 MED ORDER — DOXYCYCLINE HYCLATE 100 MG PO TABS: 100.0000 mg | ORAL_TABLET | Freq: Two times a day (BID) | ORAL | 0 refills | Status: DC

## 2023-12-16 NOTE — Telephone Encounter (Signed)
 Called and let Pt wife know.

## 2023-12-16 NOTE — Telephone Encounter (Signed)
 Ok this time - I will send, thanks

## 2023-12-16 NOTE — Telephone Encounter (Signed)
  Chief Complaint: persistent symptoms on azithromycin Symptoms: cough, wheezing at night, mild SOB Frequency: started antibiotic 12/13/23 Pertinent Negatives: Patient denies fever Disposition: [] ED /[] Urgent Care (no appt availability in office) / [x] Appointment(In office/virtual)/ []  [facility]/ [] Home Care/ [x] Refused Recommended Disposition /[] [facility]/ []  Follow-up with PCP Additional Notes: Patient's wife on phone for triage. States patient was seen at urgent care on 12/13/23 and placed on azithromycin and "amoxik". She states patient has 2 days left of medication but that the symptoms have improved but not gone away. She states the antibiotic (doxycycline) the patient received last time from his PCP worked better. Advised for antibiotic request, it is recommended patient be seen by a provider. Wife refusing acute visits and states she just needs this message sent to Dr Jonny Ruiz before the weekend. Advised for patient to continue taking his current antibiotics that were prescribed by urgent care for the time being, and to call back for new or worsening symptoms.  Copied from CRM [id number]. Topic: Clinical - Red Word Triage >> Dec 16, 2023 12:14 PM Denese Killings wrote: Red Word that prompted transfer to Nurse Triage: Patient states antibiotics from urgent care is not working. He has a really bad cough. Patient wants to get rid of the inflammation on his lungs and wheezing/shortness of breath. Patient is requesting antibiotic that doctor prescribed in Janurary. Reason for Disposition  [1] Finished taking antibiotics AND [2] symptoms are BETTER but [3] not completely gone  Answer Assessment - Initial Assessment Questions 1. INFECTION: "What infection is the antibiotic being given for?"     The urgent care told them he had "a little fluid on his lungs"  2. ANTIBIOTIC: "What antibiotic are you taking" "How many times per day?"     Azithromycin, 2 pills the first day then once daily  the other days. Wife states he has 2 days left. Wife also states patient is taking "amoxik" every 12 hours and is not helping.  3. DURATION: "When was the antibiotic started?"     12/13/23.  4. MAIN CONCERN OR SYMPTOM:  "What is your main concern right now?"     Cough (sometimes it is dry and sometimes productive), wheezing when lying down at night and mild SOB.  5. BETTER-SAME-WORSE: "Are you getting better, staying the same, or getting worse compared to when you first started the antibiotics?" If getting worse, ask: "In what way?"      "It's gotten some better"  6. FEVER: "Do you have a fever?" If Yes, ask: "What is your temperature, how was it measured, and when did it start?"     Denies.  7. SYMPTOMS: "Are there any other symptoms you're concerned about?" If Yes, ask: "When did it start?"     Denies.  8. FOLLOW-UP APPOINTMENT: "Do you have a follow-up appointment with your doctor?"     No.  Protocols used: Infection on Antibiotic Follow-up Call-A-[facility]

## 2023-12-21 ENCOUNTER — Ambulatory Visit (INDEPENDENT_AMBULATORY_CARE_PROVIDER_SITE_OTHER)

## 2023-12-21 ENCOUNTER — Ambulatory Visit (INDEPENDENT_AMBULATORY_CARE_PROVIDER_SITE_OTHER): Admitting: Nurse Practitioner

## 2023-12-21 VITALS — BP 160/74 | HR 81 | Temp 98.3°F | Ht 68.0 in | Wt 187.4 lb

## 2023-12-21 DIAGNOSIS — J441 Chronic obstructive pulmonary disease with (acute) exacerbation: Secondary | ICD-10-CM

## 2023-12-21 DIAGNOSIS — R059 Cough, unspecified: Secondary | ICD-10-CM | POA: Diagnosis not present

## 2023-12-21 DIAGNOSIS — J90 Pleural effusion, not elsewhere classified: Secondary | ICD-10-CM | POA: Diagnosis not present

## 2023-12-21 DIAGNOSIS — J9811 Atelectasis: Secondary | ICD-10-CM | POA: Diagnosis not present

## 2023-12-21 MED ORDER — ALBUTEROL SULFATE HFA 108 (90 BASE) MCG/ACT IN AERS
2.0000 | INHALATION_SPRAY | Freq: Four times a day (QID) | RESPIRATORY_TRACT | 5 refills | Status: AC | PRN
Start: 2023-12-21 — End: ?

## 2023-12-21 MED ORDER — BENZONATATE 100 MG PO CAPS
100.0000 mg | ORAL_CAPSULE | Freq: Two times a day (BID) | ORAL | 0 refills | Status: AC | PRN
Start: 2023-12-21 — End: ?

## 2023-12-21 MED ORDER — PREDNISONE 20 MG PO TABS
40.0000 mg | ORAL_TABLET | Freq: Every day | ORAL | 0 refills | Status: AC
Start: 2023-12-21 — End: ?

## 2023-12-21 NOTE — Progress Notes (Signed)
 Established Patient Office Visit  Subjective   Patient ID: Travis Carey, male    DOB: June 12, 1941  Age: 83 y.o. MRN: [id number]  Chief Complaint  Patient presents with   Cough    Patient arrives today for the above. He has past medical history significant for hypertension, CAD, bilateral carotid artery stenosis, COPD, allergic rhinitis, GERD, diabetes, cerebral infarction, carpal tunnel syndrome, right lumbar radiculopathy, right Achilles tendinitis, gout, BPH, vitamin D deficiency.   He reports about 3 months of intermittent cough with varying degrees of shortness of breath.  His wife called in last week stating that patient went to urgent care for cough and was treated with Z-Pak, but they were requesting prescription for doxycycline as he responded to doxycycline better in the past.  Patient's PCP changed him to doxycycline at that time, patient has been taking this but has not yet completed course.  He reports that during the day he feels pretty stable and at baseline but at night he coughs frequently and has a hard time sleeping.  He does report some Travis Carey sputum, denies any fever.  Uses albuterol inhaler as needed for wheezing.  As stated above does have baseline COPD but is not currently on a maintenance inhaler.  Reports he does not see pulmonology routinely.  He reports he is not taking anything to suppress cough over-the-counter at this time.       Review of Systems  Respiratory:  Positive for cough and sputum production. Negative for shortness of breath and wheezing.       Objective:     BP (!) 160/74   Pulse 81   Temp 98.3 F (36.8 C) (Temporal)   Ht 5\' 8"  (1.727 m)   Wt 187 lb 6 oz (85 kg)   SpO2 93%   BMI 28.49 kg/m  BP Readings from Last 3 Encounters:  12/21/23 (!) 160/74  10/18/23 122/72  06/16/23 (!) 161/79   Wt Readings from Last 3 Encounters:  12/21/23 187 lb 6 oz (85 kg)  10/18/23 190 lb (86.2 kg)  06/16/23 170 lb (77.1 kg)      Physical  Exam Vitals reviewed.  Constitutional:      Appearance: Normal appearance.  HENT:     Head: Normocephalic and atraumatic.  Cardiovascular:     Rate and Rhythm: Normal rate and regular rhythm.  Pulmonary:     Effort: Pulmonary effort is normal.     Breath sounds: Wheezing present.  Musculoskeletal:     Cervical back: Neck supple.  Skin:    General: Skin is warm and dry.  Neurological:     Mental Status: He is alert and oriented to person, place, and time.  Psychiatric:        Mood and Affect: Mood normal.        Behavior: Behavior normal.        Thought Content: Thought content normal.        Judgment: Judgment normal.      No results found for any visits on 12/21/23.    The ASCVD Risk score (Arnett DK, et al., 2019) failed to calculate for the following reasons:   The 2019 ASCVD risk score is only valid for ages 48 to 41   Risk score cannot be calculated because patient has a medical history suggesting prior/existing ASCVD    Assessment & Plan:   Problem List Items Addressed This Visit       Respiratory   COPD exacerbation (HCC) - Primary  Acute, vital signs stable with O2 saturation 93% Blood pressure slightly elevated however patient reports monitoring this at home regularly and it is usually lower than this.  Asymptomatic currently related to blood pressure. Per shared decision making patient will continue course of doxycycline, we will add in steroid burst of prednisone 40 mg once a day x 5 days (patient warned of potential side effects including GI bleed and further increase in blood pressure.  He was told if he has chest pain, visual changes, shortness of breath he should stop the prednisone and call this office or seek medical attention.  He was told to take Tylenol only for pain management or management of fever while taking prednisone, he reports his understanding.),  Benzonatate capsules for cough suppression, patient told he could take Mucinex as well as  over-the-counter.  Will avoid antihistamine due to his BPH.  Will also refer patient to pulmonology for assistance with managing his COPD.      Relevant Medications   predniSONE (DELTASONE) 20 MG tablet   benzonatate (TESSALON) 100 MG capsule   albuterol (VENTOLIN HFA) 108 (90 Base) MCG/ACT inhaler   Other Relevant Orders   DG Chest 2 View   Ambulatory referral to Pulmonology    Return if symptoms worsen or fail to improve.    Elenore Paddy, NP

## 2023-12-21 NOTE — Assessment & Plan Note (Signed)
 Acute, vital signs stable with O2 saturation 93% Blood pressure slightly elevated however patient reports monitoring this at home regularly and it is usually lower than this.  Asymptomatic currently related to blood pressure. Per shared decision making patient will continue course of doxycycline, we will add in steroid burst of prednisone 40 mg once a day x 5 days (patient warned of potential side effects including GI bleed and further increase in blood pressure.  He was told if he has chest pain, visual changes, shortness of breath he should stop the prednisone and call this office or seek medical attention.  He was told to take Tylenol only for pain management or management of fever while taking prednisone, he reports his understanding.),  Benzonatate capsules for cough suppression, patient told he could take Mucinex as well as over-the-counter.  Will avoid antihistamine due to his BPH.  Will also refer patient to pulmonology for assistance with managing his COPD.

## 2023-12-29 ENCOUNTER — Other Ambulatory Visit: Payer: Self-pay | Admitting: Internal Medicine

## 2023-12-29 NOTE — Telephone Encounter (Signed)
 Copied from CRM [id number]. Topic: Clinical - Medication Refill >> Dec 29, 2023 11:33 AM Denese Killings wrote: Most Recent Primary Care Visit:  Provider: Elenore Paddy  Department: [facility]  Visit Type: OFFICE VISIT  Date: 12/21/2023  Medication: hydrocortisone (ANUSOL-HC) 2.5 % rectal cream  Has the patient contacted their pharmacy? no (Agent: If no, request that the patient contact the pharmacy for the refill. If patient does not wish to contact the pharmacy document the reason why and proceed with request.) (Agent: If yes, when and what did the pharmacy advise?) refused to call pharmacy   Is this the correct pharmacy for this prescription? Yes If no, delete pharmacy and type the correct one.  This is the patient's preferred pharmacy:  [facility] [address], [place] - [address] [place] [address] Phone: [phone] Fax: [phone]   Has the prescription been filled recently? No  Is the patient out of the medication? Yes  Has the patient been seen for an appointment in the last year OR does the patient have an upcoming appointment? Yes  Can we respond through MyChart? No  Agent: Please be advised that Rx refills may take up to 3 business days. We ask that you follow-up with your pharmacy.

## 2023-12-30 MED ORDER — HYDROCORTISONE (PERIANAL) 2.5 % EX CREA: 1.0000 | TOPICAL_CREAM | Freq: Two times a day (BID) | CUTANEOUS | 1 refills | Status: AC

## 2024-01-06 ENCOUNTER — Other Ambulatory Visit: Payer: Self-pay | Admitting: Nurse Practitioner

## 2024-01-06 DIAGNOSIS — R9389 Abnormal findings on diagnostic imaging of other specified body structures: Secondary | ICD-10-CM

## 2024-01-06 NOTE — Addendum Note (Signed)
 Addended by: Elenore Paddy on: 01/06/2024 09:42 AM   Modules accepted: Orders

## 2024-01-09 ENCOUNTER — Telehealth: Payer: Self-pay | Admitting: Internal Medicine

## 2024-01-09 DIAGNOSIS — J90 Pleural effusion, not elsewhere classified: Secondary | ICD-10-CM

## 2024-01-09 NOTE — Telephone Encounter (Signed)
 Copied from CRM [id number]. Topic: Clinical - Lab/Test Results >> Jan 09, 2024 10:36 AM Florestine Avers wrote: Reason for CRM: Call report for Travis Carey patient for chest x-ray, can be reached at [phone] for report.

## 2024-01-12 NOTE — Telephone Encounter (Signed)
**Note De-identified  [place] Obfuscation** Please advise 

## 2024-01-17 ENCOUNTER — Ambulatory Visit: Payer: Self-pay | Admitting: Physician Assistant

## 2024-01-17 ENCOUNTER — Encounter: Payer: Self-pay | Admitting: Physician Assistant

## 2024-01-25 ENCOUNTER — Ambulatory Visit: Admitting: Physician Assistant

## 2024-01-26 ENCOUNTER — Other Ambulatory Visit: Payer: Self-pay

## 2024-01-26 ENCOUNTER — Other Ambulatory Visit: Payer: Self-pay | Admitting: Internal Medicine

## 2024-01-28 ENCOUNTER — Other Ambulatory Visit: Payer: Self-pay | Admitting: Internal Medicine

## 2024-02-02 ENCOUNTER — Ambulatory Visit: Admitting: Internal Medicine

## 2024-02-02 ENCOUNTER — Encounter: Payer: Self-pay | Admitting: Internal Medicine

## 2024-02-02 VITALS — BP 132/66 | HR 56 | Temp 97.6°F | Ht 68.0 in | Wt 188.0 lb

## 2024-02-02 DIAGNOSIS — J918 Pleural effusion in other conditions classified elsewhere: Secondary | ICD-10-CM | POA: Diagnosis not present

## 2024-02-02 DIAGNOSIS — J90 Pleural effusion, not elsewhere classified: Secondary | ICD-10-CM

## 2024-02-02 DIAGNOSIS — R9389 Abnormal findings on diagnostic imaging of other specified body structures: Secondary | ICD-10-CM | POA: Diagnosis not present

## 2024-02-02 NOTE — Patient Instructions (Addendum)
 Please follow-up with CT of the chest for further assessment of your lungs  Avoid Allergens and Irritants Avoid secondhand smoke Avoid SICK contacts Recommend  Masking  when appropriate Recommend Keep up-to-date with vaccinations

## 2024-02-02 NOTE — Progress Notes (Unsigned)
 [facility] [place] Pulmonary Medicine Consultation      Date: 02/02/2024,   MRN# [id number] Travis Carey Dec 02, 1940      CHIEF COMPLAINT:   Abnormal chest x-ray   HISTORY OF PRESENT ILLNESS   83 year old pleasant African-American male seen today for abnormal chest x-ray Patient is a non-smoker nonalcoholic Patient diagnosed with walking pneumonia several months ago Patient was given antibiotics Patient had some shortness of breath and dyspnea on exertion Over the last 4 weeks this has significantly improved   No evidence of heart failure at this time No evidence or signs of infection at this time No respiratory distress No fevers, chills, nausea, vomiting, diarrhea No evidence of lower extremity edema No evidence hemoptysis     December 21, 2023 chest x-ray Reviewed in detail with patient Findings suggest small left pleural effusion atelectasis consolidation These are similar findings compared to September 2024       Point-of-care ultrasound in office today 02/02/2024 [name] consent obtained left lung visualized I do not see evidence of significant pleural effusion Follow-up CT chest pending           PAST MEDICAL HISTORY   Past Medical History:  Diagnosis Date   Allergic rhinitis, cause unspecified 01/31/2013   ANEMIA-NOS 01/28/2010   BPH (benign prostatic hypertrophy) 04/25/2013   CAD 02/17/2010   CAROTID BRUIT 11/18/2009   Carpal tunnel syndrome 05/20/2008   Cervical radiculitis 04/17/2012   CHEST PAIN 09/26/2009   DEPRESSION 06/12/2007   DYSPNEA 07/28/2010   ERECTILE DYSFUNCTION 02/24/2009   GERD 06/09/2007   GERD (gastroesophageal reflux disease)    GLUCOSE INTOLERANCE 01/28/2010   Gout 2017   Dr.John   HEMORRHOIDS 06/09/2007   History of colonic polyps 08/07/2015   HYPERLIPIDEMIA 02/07/2008   HYPERTENSION 06/09/2007   HYPOKALEMIA 01/28/2010   Impaired glucose tolerance 01/23/2011   LOW BACK PAIN 06/12/2007   MYOCARDIAL PERFUSION SCAN, WITH STRESS TEST,  ABNORMAL 10/08/2009   PARESTHESIA 04/25/2008   SHOULDER PAIN, LEFT 04/25/2008   URI 08/19/2008   VITAMIN B12 DEFICIENCY 02/09/2010     SURGICAL HISTORY   Past Surgical History:  Procedure Laterality Date   COLONOSCOPY     CORONARY STENT PLACEMENT     from right radial     FAMILY HISTORY   Family History  Problem Relation Age of Onset   Hyperlipidemia Mother    Heart disease Mother    Hypertension Mother    Hyperlipidemia Father    Heart disease Father    Hypertension Father    Alcohol abuse Sister        ETOH dependence   Alcohol abuse Brother        ETOH  dependence   Heart disease Brother    Heart attack Other    Stroke Other    Hypertension Other    Coronary artery disease Other      SOCIAL HISTORY   Social History   Tobacco Use   Smoking status: Never   Smokeless tobacco: Never  Vaping Use   Vaping status: Never Used  Substance Use Topics   Alcohol use: No   Drug use: No     MEDICATIONS    Home Medication:  Current Outpatient Rx   Order #: [id number] Class: Normal   Order #: [id number] Class: Normal   Order #: [id number] Class: Historical Med   Order #: [id number] Class: Normal   Order #: [id number] Class: Normal   Order #: [id number] Class: Normal   Order #: [id number] Class: Normal   Order #: [id number] Class: Normal  Order #: [id number] Class: Normal   Order #: [id number] Class: Normal   Order #: [id number] Class: Normal   Order #: [id number] Class: Normal   Order #: [id number] Class: Normal   Order #: [id number] Class: Normal   Order #: [id number] Class: Normal   Order #: [id number] Class: Normal   Order #: [id number] Class: Normal   Order #: [id number] Class: Normal    Current Medication:  Current Outpatient Medications:    albuterol  (VENTOLIN  HFA) 108 (90 Base) MCG/ACT inhaler, Inhale 2 puffs into the lungs every 6 (six) hours as needed for wheezing or shortness of breath., Disp: 8 g, Rfl: 5   amLODipine  (NORVASC ) 5 MG tablet, Take 1 tablet by mouth once daily,  Disp: 90 tablet, Rfl: 3   aspirin  EC 81 MG tablet, Take 81 mg by mouth daily., Disp: , Rfl:    benzonatate  (TESSALON ) 100 MG capsule, Take 1 capsule (100 mg total) by mouth 2 (two) times daily as needed for cough., Disp: 20 capsule, Rfl: 0   carvedilol  (COREG ) 25 MG tablet, TAKE 1 TABLET BY MOUTH TWICE DAILY WITH A MEAL, Disp: 180 tablet, Rfl: 3   diclofenac  sodium (VOLTAREN ) 1 % GEL, Apply 2 g topically 4 (four) times daily as needed., Disp: 100 g, Rfl: 5   fexofenadine  (ALLEGRA ) 180 MG tablet, Take 1 tablet (180 mg total) by mouth daily. As needed, Disp: 90 tablet, Rfl: 3   fluticasone  (FLONASE ) 50 MCG/ACT nasal spray, Place 2 sprays into both nostrils daily., Disp: 11.1 mL, Rfl: 2   furosemide  (LASIX ) 20 MG tablet, with 3 potassium, Disp: 90 tablet, Rfl: 3   gabapentin  (NEURONTIN ) 100 MG capsule, Take 2 capsules (200 mg total) by mouth 3 (three) times daily., Disp: 180 capsule, Rfl: 1   hydrocortisone  (ANUSOL -HC) 2.5 % rectal cream, Place 1 Application rectally 2 (two) times daily., Disp: 30 g, Rfl: 1   lisinopril -hydrochlorothiazide  (ZESTORETIC ) 20-12.5 MG tablet, Take 1 tablet by mouth once daily, Disp: 90 tablet, Rfl: 0   MITIGARE  0.6 MG CAPS, Take 1 capsule by mouth once daily, Disp: 90 capsule, Rfl: 0   potassium chloride  (KLOR-CON ) 10 MEQ tablet, Take 2 tablets (20 mEq total) by mouth daily. Take an extra tablet every other day when you take your furosemide  (Lasix ), Disp: 225 tablet, Rfl: 2   predniSONE  (DELTASONE ) 20 MG tablet, Take 2 tablets (40 mg total) by mouth daily with breakfast., Disp: 10 tablet, Rfl: 0   rosuvastatin  (CRESTOR ) 10 MG tablet, Take 1 tablet by mouth once daily, Disp: 90 tablet, Rfl: 0   tamsulosin  (FLOMAX ) 0.4 MG CAPS capsule, Take 1 capsule by mouth once daily, Disp: 90 capsule, Rfl: 3   traMADol  (ULTRAM ) 50 MG tablet, TAKE 1 TABLET BY MOUTH EVERY 6 HOURS AS NEEDED, Disp: 60 tablet, Rfl: 2    ALLERGIES   Diclofenac  sodium  BP 132/66 (BP Location: Left Arm,  Patient Position: Sitting, Cuff Size: Normal)   Pulse (!) 56   Temp 97.6 F (36.4 C) (Temporal)   Ht 5\' 8"  (1.727 m)   Wt 188 lb (85.3 kg)   SpO2 100%   BMI 28.59 kg/m    Review of Systems: Gen:  Denies  fever, sweats, chills weight loss  HEENT: Denies blurred vision, double vision, ear pain, eye pain, hearing loss, nose bleeds, sore throat Cardiac:  No dizziness, chest pain or heaviness, chest tightness,edema, No JVD Resp:   No cough, -sputum production, -shortness of breath,-wheezing, -hemoptysis,  Other:  All other systems negative   Physical Examination:   General Appearance: No distress  EYES PERRLA, EOM intact.   NECK Supple, No JVD Pulmonary: normal breath sounds, No wheezing.  CardiovascularNormal S1,S2.  No m/r/g.   Abdomen: Benign, Soft, non-tender. Neurology UE/LE 5/5 strength, no focal deficits Ext pulses intact, cap refill intact ALL OTHER ROS ARE NEGATIVE        ASSESSMENT/PLAN   83 year old pleasant African-American male seen today for diagnosis of pneumonia with effusion based on chest x-ray signs symptoms of pneumonia status post antibiotic therapy here today to assess for abnormal chest x-ray  In review of chest x-ray and point-of-care ultrasound testing today I do not see any signs of significant effusion and there is no significant signs of infection at this time  I have recommended that patient follow-up with CT of the chest and follow-up with us  for further assessment  Avoid Allergens and Irritants Avoid secondhand smoke Avoid SICK contacts Recommend  Masking  when appropriate Recommend Keep up-to-date with vaccinations    Patient  satisfied with Plan of action and management. All questions answered  Follow up  4 weeks  I spent a total of 65 minutes reviewing chart data, face-to-face evaluation with the patient, counseling and coordination of care as detailed above.     Lady Pier, M.D.  [facility]  Medicine  Medical Director [facility] Medical Director [facility] Cardio-Pulmonary Department

## 2024-02-07 ENCOUNTER — Other Ambulatory Visit: Payer: Self-pay

## 2024-02-07 ENCOUNTER — Other Ambulatory Visit: Payer: Self-pay | Admitting: Internal Medicine

## 2024-02-09 NOTE — Telephone Encounter (Signed)
 This was already done by Ms Martina Sledge on mar 28  Apparently the CT chest follow up has not been done yet.  I will re-order    thanks

## 2024-02-13 ENCOUNTER — Telehealth: Payer: Self-pay | Admitting: Internal Medicine

## 2024-02-13 MED ORDER — LISINOPRIL-HYDROCHLOROTHIAZIDE 20-12.5 MG PO TABS: 1.0000 | ORAL_TABLET | Freq: Every day | ORAL | 0 refills | Status: DC

## 2024-02-13 NOTE — Telephone Encounter (Signed)
 Copied from CRM [id number]. Topic: Clinical - Prescription Issue >> Feb 13, 2024  9:39 AM Orien Bird wrote: Reason for CRM: Patient wife stated that husband called pharmacy about his refill that was sent in on 02/07/24 lisinopril -hydrochlorothiazide  (ZESTORETIC ) 20-12.5 MG tablet and pharmacy doesn't have it and they are wanting this refill today.

## 2024-02-13 NOTE — Telephone Encounter (Signed)
 Rx resent.

## 2024-02-29 ENCOUNTER — Encounter

## 2024-02-29 NOTE — Progress Notes (Signed)
 This encounter was created in error - please disregard. Called x3 with no answer.  I LDM for patient to call and reschedule visit.

## 2024-03-09 ENCOUNTER — Ambulatory Visit: Admitting: Internal Medicine

## 2024-03-27 ENCOUNTER — Other Ambulatory Visit: Payer: Self-pay | Admitting: Internal Medicine

## 2024-03-28 ENCOUNTER — Other Ambulatory Visit: Payer: Self-pay

## 2024-03-30 ENCOUNTER — Telehealth: Payer: Self-pay | Admitting: Internal Medicine

## 2024-03-30 ENCOUNTER — Other Ambulatory Visit: Payer: Self-pay | Admitting: Internal Medicine

## 2024-03-30 NOTE — Telephone Encounter (Signed)
 Copied from CRM [id number]. Topic: General - Call Back - No Documentation >> Mar 30, 2024 11:40 AM Lajean Pike wrote: Reason for CRM: Ernesto Heady is calling for Patient. She stated if Jullie Oiler could give her a call. She said if she experienced any issues to give a call back to the clinic. Call was dropped due to experiencing loss of connection. But patients contact is [phone].

## 2024-03-30 NOTE — Telephone Encounter (Signed)
 Copied from CRM [id number]. Topic: Clinical - Medication Refill >> Mar 30, 2024 11:43 AM Pam Bode wrote: Medication:  amLODipine  (NORVASC ) 5 MG tablet  Has the patient contacted their pharmacy? Yes (Agent: If no, request that the patient contact the pharmacy for the refill. If patient does not wish to contact the pharmacy document the reason why and proceed with request.) (Agent: If yes, when and what did the pharmacy advise?)  This is the patient's preferred pharmacy:  [facility] [address] [place] - [address] [place] [address] Phone: [phone] Fax: [phone]  Is this the correct pharmacy for this prescription? Yes If no, delete pharmacy and type the correct one.   Has the prescription been filled recently? No  Is the patient out of the medication? Yes  Has the patient been seen for an appointment in the last year OR does the patient have an upcoming appointment? Yes  Can we respond through MyChart? No  Agent: Please be advised that Rx refills may take up to 3 business days. We ask that you follow-up with your pharmacy.

## 2024-03-30 NOTE — Telephone Encounter (Signed)
 Called and spoke with Pt and she states she has spoke with someone already.

## 2024-04-27 ENCOUNTER — Other Ambulatory Visit: Payer: Self-pay | Admitting: Internal Medicine

## 2024-05-07 ENCOUNTER — Ambulatory Visit

## 2024-05-16 ENCOUNTER — Other Ambulatory Visit: Payer: Self-pay | Admitting: Internal Medicine

## 2024-05-28 ENCOUNTER — Ambulatory Visit (INDEPENDENT_AMBULATORY_CARE_PROVIDER_SITE_OTHER)

## 2024-05-28 VITALS — Ht 68.0 in | Wt 188.0 lb

## 2024-05-28 DIAGNOSIS — Z0000 Encounter for general adult medical examination without abnormal findings: Secondary | ICD-10-CM

## 2024-05-28 DIAGNOSIS — K635 Polyp of colon: Secondary | ICD-10-CM

## 2024-05-28 NOTE — Progress Notes (Signed)
 Subjective:   Travis Carey is a 83 y.o. who presents for a Medicare Wellness preventive visit.  As a reminder, Annual Wellness Visits don't include a physical exam, and some assessments may be limited, especially if this visit is performed virtually. We may recommend an in-person follow-up visit with your provider if needed.  Visit Complete: Virtual I connected with  Travis Carey on 05/28/24 by a audio enabled telemedicine application and verified that I am speaking with the correct person using two identifiers.  Patient Location: Home  Provider Location: Office/Clinic  I discussed the limitations of evaluation and management by telemedicine. The patient expressed understanding and agreed to proceed.  Vital Signs: Because this visit was a virtual/telehealth visit, some criteria may be missing or patient reported. Any vitals not documented were not able to be obtained and vitals that have been documented are patient reported.  VideoDeclined- This patient declined Librarian, academic. Therefore the visit was completed with audio only.  Persons Participating in Visit: Patient.  AWV Questionnaire: No: Patient Medicare AWV questionnaire was not completed prior to this visit.  Cardiac Risk Factors include: advanced age (>33men, >69 women);diabetes mellitus;dyslipidemia;hypertension;male gender     Objective:    Today's Vitals   05/28/24 1233  Weight: 188 lb (85.3 kg)  Height: 5' 8 (1.727 m)   Body mass index is 28.59 kg/m.     05/28/2024   12:33 PM 06/16/2023    1:27 PM 01/13/2023    2:05 PM 12/04/2021    3:15 PM 08/21/2018    9:38 AM 08/21/2018    9:35 AM 07/22/2018   12:15 PM  Advanced Directives  Does Patient Have a Medical Advance Directive? No No No No No  No  No   Would patient like information on creating a medical advance directive? Yes (MAU/Ambulatory/Procedural Areas - Information given)  No - Patient declined No - Patient declined No -  Patient declined   No - Patient declined      Data saved with a previous flowsheet row definition    Current Medications (verified) Outpatient Encounter Medications as of 05/28/2024  Medication Sig   albuterol  (VENTOLIN  HFA) 108 (90 Base) MCG/ACT inhaler Inhale 2 puffs into the lungs every 6 (six) hours as needed for wheezing or shortness of breath.   amLODipine  (NORVASC ) 5 MG tablet Take 1 tablet by mouth once daily   aspirin  EC 81 MG tablet Take 81 mg by mouth daily.   benzonatate  (TESSALON ) 100 MG capsule Take 1 capsule (100 mg total) by mouth 2 (two) times daily as needed for cough.   carvedilol  (COREG ) 25 MG tablet TAKE 1 TABLET BY MOUTH TWICE DAILY WITH A MEAL   diclofenac  sodium (VOLTAREN ) 1 % GEL Apply 2 g topically 4 (four) times daily as needed.   fexofenadine  (ALLEGRA ) 180 MG tablet Take 1 tablet (180 mg total) by mouth daily. As needed   fluticasone  (FLONASE ) 50 MCG/ACT nasal spray Place 2 sprays into both nostrils daily.   furosemide  (LASIX ) 20 MG tablet with 3 potassium   gabapentin  (NEURONTIN ) 100 MG capsule Take 2 capsules (200 mg total) by mouth 3 (three) times daily.   hydrocortisone  (ANUSOL -HC) 2.5 % rectal cream Place 1 Application rectally 2 (two) times daily.   lisinopril -hydrochlorothiazide  (ZESTORETIC ) 20-12.5 MG tablet Take 1 tablet by mouth daily.   MITIGARE  0.6 MG CAPS Take 1 capsule by mouth once daily   potassium chloride  (KLOR-CON ) 10 MEQ tablet Take 2 tablets (20 mEq total) by mouth  daily. Take an extra tablet every other day when you take your furosemide  (Lasix )   predniSONE  (DELTASONE ) 20 MG tablet Take 2 tablets (40 mg total) by mouth daily with breakfast.   rosuvastatin  (CRESTOR ) 10 MG tablet Take 1 tablet by mouth once daily   tamsulosin  (FLOMAX ) 0.4 MG CAPS capsule Take 1 capsule by mouth once daily   traMADol  (ULTRAM ) 50 MG tablet TAKE 1 TABLET BY MOUTH EVERY 6 HOURS AS NEEDED (Patient not taking: Reported on 05/28/2024)   No facility-administered  encounter medications on file as of 05/28/2024.    Allergies (verified) Diclofenac  sodium   History: Past Medical History:  Diagnosis Date   Allergic rhinitis, cause unspecified 01/31/2013   ANEMIA-NOS 01/28/2010   BPH (benign prostatic hypertrophy) 04/25/2013   CAD 02/17/2010   CAROTID BRUIT 11/18/2009   Carpal tunnel syndrome 05/20/2008   Cervical radiculitis 04/17/2012   CHEST PAIN 09/26/2009   DEPRESSION 06/12/2007   DYSPNEA 07/28/2010   ERECTILE DYSFUNCTION 02/24/2009   GERD 06/09/2007   GERD (gastroesophageal reflux disease)    GLUCOSE INTOLERANCE 01/28/2010   Gout 2017   Dr.John   HEMORRHOIDS 06/09/2007   History of colonic polyps 08/07/2015   HYPERLIPIDEMIA 02/07/2008   HYPERTENSION 06/09/2007   HYPOKALEMIA 01/28/2010   Impaired glucose tolerance 01/23/2011   LOW BACK PAIN 06/12/2007   MYOCARDIAL PERFUSION SCAN, WITH STRESS TEST, ABNORMAL 10/08/2009   PARESTHESIA 04/25/2008   SHOULDER PAIN, LEFT 04/25/2008   URI 08/19/2008   VITAMIN B12 DEFICIENCY 02/09/2010   Past Surgical History:  Procedure Laterality Date   COLONOSCOPY     CORONARY STENT PLACEMENT     from right radial   Family History  Problem Relation Age of Onset   Hyperlipidemia Mother    Heart disease Mother    Hypertension Mother    Hyperlipidemia Father    Heart disease Father    Hypertension Father    Alcohol abuse Sister        ETOH dependence   Alcohol abuse Brother        ETOH  dependence   Heart disease Brother    Heart attack Other    Stroke Other    Hypertension Other    Coronary artery disease Other    Social History   Socioeconomic History   Marital status: Widowed    Spouse name: Not on file   Number of children: 5   Years of education: Not on file   Highest education level: Not on file  Occupational History   Occupation: Curator   Occupation: minister  Tobacco Use   Smoking status: Never   Smokeless tobacco: Never  Vaping Use   Vaping status: Never Used  Substance and Sexual Activity    Alcohol use: No   Drug use: No   Sexual activity: Not Currently  Other Topics Concern   Not on file  Social History Narrative   Lives in [place]. Works as Curator. 5 children.      Wife died 01/10/08   Social Drivers of Health   Financial Resource Strain: Low Risk  (05/28/2024)   Overall Financial Resource Strain (CARDIA)    Difficulty of Paying Living Expenses: Not hard at all  Food Insecurity: No Food Insecurity (05/28/2024)   Hunger Vital Sign    Worried About Running Out of Food in the Last Year: Never true    Ran Out of Food in the Last Year: Never true  Transportation Needs: No Transportation Needs (05/28/2024)   PRAPARE - Transportation  Lack of Transportation (Medical): No    Lack of Transportation (Non-Medical): No  Physical Activity: Insufficiently Active (05/28/2024)   Exercise Vital Sign    Days of Exercise per Week: 5 days    Minutes of Exercise per Session: 20 min  Stress: No Stress Concern Present (05/28/2024)   [facility] [employer] - Occupational Stress Questionnaire    Feeling of Stress: Not at all  Social Connections: Moderately Integrated (05/28/2024)   Social Connection and Isolation Panel    Frequency of Communication with Friends and Family: More than three times a week    Frequency of Social Gatherings with Friends and Family: More than three times a week    Attends Religious Services: More than 4 times per year    Active Member of [employer] or Organizations: Yes    Attends Banker Meetings: More than 4 times per year    Marital Status: Widowed    Tobacco Counseling Counseling given: No    Clinical Intake:  Pre-visit preparation completed: Yes  Pain : No/denies pain     BMI - recorded: 28.59 Nutritional Status: BMI 25 -29 Overweight Nutritional Risks: None Diabetes: Yes CBG done?: No Did pt. bring in CBG monitor from home?: No  Lab Results  Component Value Date   HGBA1C 6.3 10/18/2023   HGBA1C 6.4  10/27/2022   HGBA1C 6.5 04/26/2022     How often do you need to have someone help you when you read instructions, pamphlets, or other written materials from your doctor or pharmacy?: 1 - Never  Interpreter Needed?: No  Information entered by :: Verdie Saba, CMA   Activities of Daily Living     05/28/2024   12:36 PM  In your present state of health, do you have any difficulty performing the following activities:  Hearing? 0  Vision? 0  Difficulty concentrating or making decisions? 0  Walking or climbing stairs? 0  Dressing or bathing? 0  Doing errands, shopping? 0  Preparing Food and eating ? N  Using the Toilet? N  In the past six months, have you accidently leaked urine? N  Do you have problems with loss of bowel control? N  Managing your Medications? N  Managing your Finances? N  Housekeeping or managing your Housekeeping? N    Patient Care Team: Norleen Lynwood ORN, MD as PCP - General (Internal Medicine) Perla Evalene PARAS, MD as PCP - Cardiology (Cardiology) Perla Evalene PARAS, MD as Consulting Physician (Cardiology) Carolee Manus DASEN., MD as Consulting Physician (Ophthalmology)  I have updated your Care Teams any recent Medical Services you may have received from other providers in the past year.     Assessment:   This is a routine wellness examination for Travis Carey.  Hearing/Vision screen Hearing Screening - Comments:: Denies hearing difficulties   Vision Screening - Comments:: Pt plans to schedule an appt w/an Optometrist   Goals Addressed               This Visit's Progress     Patient Stated (pt-stated)        Patient stated he tries to watch food intake and continue exercising       Depression Screen     05/28/2024   12:37 PM 10/18/2023   11:06 AM 05/10/2023    9:43 AM 04/21/2023   10:49 AM 01/13/2023    2:08 PM 10/27/2022   10:57 AM 10/27/2022   10:27 AM  PHQ 2/9 Scores  PHQ - 2 Score 0 0 0  0 0 0 0  PHQ- 9 Score 1    0      Fall Risk     05/28/2024    12:37 PM 10/18/2023   11:06 AM 05/10/2023    9:43 AM 04/21/2023   10:49 AM 01/13/2023    2:05 PM  Fall Risk   Falls in the past year? 0 0 0 0 0  Number falls in past yr: 0 0 0 0 0  Injury with Fall? 0 0 0 0 0  Risk for fall due to : No Fall Risks No Fall Risks No Fall Risks No Fall Risks No Fall Risks  Follow up Falls evaluation completed;Falls prevention discussed Falls evaluation completed Falls evaluation completed Falls evaluation completed Falls prevention discussed    MEDICARE RISK AT HOME:  Medicare Risk at Home Any stairs in or around the home?: No If so, are there any without handrails?: No Home free of loose throw rugs in walkways, pet beds, electrical cords, etc?: Yes Adequate lighting in your home to reduce risk of falls?: Yes Life alert?: No Use of a cane, walker or w/c?: No Grab bars in the bathroom?: No Shower chair or bench in shower?: No Elevated toilet seat or a handicapped toilet?: No  TIMED UP AND GO:  Was the test performed?  No  Cognitive Function: 6CIT completed        05/28/2024   12:55 PM 01/13/2023    2:10 PM  6CIT Screen  What Year? 0 points 0 points  What month? 0 points 0 points  What time? 0 points 0 points  Count back from 20 0 points 0 points  Months in reverse 4 points 0 points  Repeat phrase 10 points 0 points  Total Score 14 points 0 points    Immunizations Immunization History  Administered Date(s) Administered   Fluad Quad(high Dose 65+) 10/29/2022, 08/26/2023   Influenza Split 07/27/2011   Influenza Whole 08/09/1997, 08/28/2009, 07/28/2010   Influenza, High Dose Seasonal PF 08/11/2017, 08/17/2018, 07/23/2019, 07/23/2019   Influenza,inj,Quad PF,6+ Mos 06/12/2014, 08/07/2015   Influenza-Unspecified 06/11/2013, 06/12/2014, 08/07/2015, 07/13/2021   PFIZER Comirnaty(Gray Top)Covid-19 Tri-Sucrose Vaccine 02/05/2020, 02/26/2020   PFIZER(Purple Top)SARS-COV-2 Vaccination 01/30/2020, 10/01/2020, 04/22/2022   Pneumococcal Conjugate-13  11/01/2013   Pneumococcal Polysaccharide-23 08/19/2008   Td 08/19/2008   Tdap 12/07/2018   Zoster Recombinant(Shingrix) 04/20/2022, 10/29/2022   Zoster, Live 01/31/2013    Screening Tests Health Maintenance  Topic Date Due   Diabetic kidney evaluation - Urine ACR  Never done   Colonoscopy  10/11/2017   COVID-19 Vaccine (6 - 2024-25 season) 06/12/2023   OPHTHALMOLOGY EXAM  06/29/2023   FOOT EXAM  10/28/2023   HEMOGLOBIN A1C  04/16/2024   INFLUENZA VACCINE  05/11/2024   Diabetic kidney evaluation - eGFR measurement  10/17/2024   Medicare Annual Wellness (AWV)  05/28/2025   DTaP/Tdap/Td (3 - Td or Tdap) 12/07/2028   Pneumococcal Vaccine: 50+ Years  Completed   Zoster Vaccines- Shingrix  Completed   HPV VACCINES  Aged Out   Meningococcal B Vaccine  Aged Out   Hepatitis C Screening  Discontinued    Health Maintenance  Health Maintenance Due  Topic Date Due   Diabetic kidney evaluation - Urine ACR  Never done   Colonoscopy  10/11/2017   COVID-19 Vaccine (6 - 2024-25 season) 06/12/2023   OPHTHALMOLOGY EXAM  06/29/2023   FOOT EXAM  10/28/2023   HEMOGLOBIN A1C  04/16/2024   INFLUENZA VACCINE  05/11/2024   Health Maintenance Items Addressed:  Referral  sent to GI for colonoscopy to [facility]  Additional Screening:  Vision Screening: Recommended annual ophthalmology exams for early detection of glaucoma and other disorders of the eye. Would you like a referral to an eye doctor? No    Dental Screening: Recommended annual dental exams for proper oral hygiene  Community Resource Referral / Chronic Care Management: CRR required this visit?  No   CCM required this visit?  No   Plan:    I have personally reviewed and noted the following in the patient's chart:   Medical and social history Use of alcohol, tobacco or illicit drugs  Current medications and supplements including opioid prescriptions. Patient is not currently taking opioid prescriptions. Functional ability  and status Nutritional status Physical activity Advanced directives List of other physicians Hospitalizations, surgeries, and ER visits in previous 12 months Vitals Screenings to include cognitive, depression, and falls Referrals and appointments  In addition, I have reviewed and discussed with patient certain preventive protocols, quality metrics, and best practice recommendations. A written personalized care plan for preventive services as well as general preventive health recommendations were provided to patient.   Verdie CHRISTELLA Saba, CMA   05/28/2024   After Visit Summary: (Declined) Due to this being a telephonic visit, with patients personalized plan was offered to patient but patient Declined AVS at this time   Notes: Patient advised to schedule an appt w/PCP - last appt 10/2023.  Pt declined to schedule today.  Patient also states will schedule an appt w/an Optometrist himself.  Cognitive Score = 14

## 2024-05-28 NOTE — Patient Instructions (Addendum)
 [name] , Thank you for taking time out of your busy schedule to complete your Annual Wellness Visit with me. I enjoyed our conversation and look forward to speaking with you again next year. I, as well as your care team,  appreciate your ongoing commitment to your health goals. Please review the following plan we discussed and let me know if I can assist you in the future. Your Game plan/ To Do List    Referrals: If you haven't heard from the office you've been referred to, please reach out to them at the phone provided. Referral for a repeat Colonoscopy  Follow up Visits: We will see or speak with you next year for your Next Medicare AWV with our clinical staff Have you seen your provider in the last 6 months (3 months if uncontrolled diabetes)? Yes  Clinician Recommendations:  Aim for 30 minutes of exercise or brisk walking, 6-8 glasses of water, and 5 servings of fruits and vegetables each day.       This is a list of the screenings recommended for you:  Health Maintenance  Topic Date Due   Yearly kidney health urinalysis for diabetes  Never done   Colon Cancer Screening  10/11/2017   COVID-19 Vaccine (6 - 2024-25 season) 06/12/2023   Eye exam for diabetics  06/29/2023   Complete foot exam   10/28/2023   Hemoglobin A1C  04/16/2024   Flu Shot  05/11/2024   Yearly kidney function blood test for diabetes  10/17/2024   Medicare Annual Wellness Visit  05/28/2025   DTaP/Tdap/Td vaccine (3 - Td or Tdap) 12/07/2028   Pneumococcal Vaccine for age over 57  Completed   Zoster (Shingles) Vaccine  Completed   HPV Vaccine  Aged Out   Meningitis B Vaccine  Aged Out   Hepatitis C Screening  Discontinued    Advanced directives: (Provided) Advance directive discussed with you today. I have provided a copy for you to complete at home and have notarized. Once this is complete, please bring a copy in to our office so we can scan it into your chart.  Advance Care Planning is important because  it:  [x]  Makes sure you receive the medical care that is consistent with your values, goals, and preferences  [x]  It provides guidance to your family and loved ones and reduces their decisional burden about whether or not they are making the right decisions based on your wishes.  Follow the link provided in your after visit summary or read over the paperwork we have mailed to you to help you started getting your Advance Directives in place. If you need assistance in completing these, please reach out to us  so that we can help you!

## 2024-05-30 ENCOUNTER — Ambulatory Visit: Admitting: Internal Medicine

## 2024-06-20 ENCOUNTER — Other Ambulatory Visit: Payer: Self-pay | Admitting: Internal Medicine

## 2024-06-24 ENCOUNTER — Other Ambulatory Visit: Payer: Self-pay | Admitting: Internal Medicine

## 2024-07-25 ENCOUNTER — Ambulatory Visit (INDEPENDENT_AMBULATORY_CARE_PROVIDER_SITE_OTHER): Admitting: Internal Medicine

## 2024-07-25 ENCOUNTER — Ambulatory Visit: Payer: Self-pay | Admitting: Internal Medicine

## 2024-07-25 ENCOUNTER — Encounter: Payer: Self-pay | Admitting: Internal Medicine

## 2024-07-25 VITALS — BP 142/78 | HR 60 | Temp 98.2°F | Ht 68.0 in | Wt 189.0 lb

## 2024-07-25 DIAGNOSIS — E782 Mixed hyperlipidemia: Secondary | ICD-10-CM

## 2024-07-25 DIAGNOSIS — R1032 Left lower quadrant pain: Secondary | ICD-10-CM

## 2024-07-25 DIAGNOSIS — J309 Allergic rhinitis, unspecified: Secondary | ICD-10-CM | POA: Diagnosis not present

## 2024-07-25 DIAGNOSIS — R1030 Lower abdominal pain, unspecified: Secondary | ICD-10-CM

## 2024-07-25 DIAGNOSIS — E1165 Type 2 diabetes mellitus with hyperglycemia: Secondary | ICD-10-CM

## 2024-07-25 DIAGNOSIS — Z23 Encounter for immunization: Secondary | ICD-10-CM

## 2024-07-25 DIAGNOSIS — E559 Vitamin D deficiency, unspecified: Secondary | ICD-10-CM

## 2024-07-25 DIAGNOSIS — I10 Essential (primary) hypertension: Secondary | ICD-10-CM

## 2024-07-25 DIAGNOSIS — E538 Deficiency of other specified B group vitamins: Secondary | ICD-10-CM

## 2024-07-25 DIAGNOSIS — T7840XA Allergy, unspecified, initial encounter: Secondary | ICD-10-CM

## 2024-07-25 LAB — BASIC METABOLIC PANEL WITH GFR
BUN: 14 mg/dL (ref 6–23)
CO2: 31 meq/L (ref 19–32)
Calcium: 9.8 mg/dL (ref 8.4–10.5)
Chloride: 105 meq/L (ref 96–112)
Creatinine, Ser: 1.12 mg/dL (ref 0.40–1.50)
GFR: 61.14 mL/min (ref >60.00)
Glucose, Bld: 83 mg/dL (ref 70–99)
Potassium: 3.7 meq/L (ref 3.5–5.1)
Sodium: 142 meq/L (ref 135–145)

## 2024-07-25 LAB — CBC WITH DIFFERENTIAL/PLATELET
Basophils Absolute: 0 K/uL (ref 0.0–0.1)
Basophils Relative: 0.6 % (ref 0.0–3.0)
Eosinophils Absolute: 0.2 K/uL (ref 0.0–0.7)
Eosinophils Relative: 2.8 % (ref 0.0–5.0)
HCT: 38.9 % — ABNORMAL LOW (ref 39.0–52.0)
Hemoglobin: 12.8 g/dL — ABNORMAL LOW (ref 13.0–17.0)
Lymphocytes Relative: 38.7 % (ref 12.0–46.0)
Lymphs Abs: 2.2 K/uL (ref 0.7–4.0)
MCHC: 32.8 g/dL (ref 30.0–36.0)
MCV: 88.2 fl (ref 78.0–100.0)
Monocytes Absolute: 0.6 K/uL (ref 0.1–1.0)
Monocytes Relative: 9.7 % (ref 3.0–12.0)
Neutro Abs: 2.8 K/uL (ref 1.4–7.7)
Neutrophils Relative %: 48.2 % (ref 43.0–77.0)
Platelets: 216 K/uL (ref 150.0–400.0)
RBC: 4.41 Mil/uL (ref 4.22–5.81)
RDW: 14.8 % (ref 11.5–15.5)
WBC: 5.8 K/uL (ref 4.0–10.5)

## 2024-07-25 LAB — HEPATIC FUNCTION PANEL
ALT: 12 U/L (ref 0–53)
AST: 20 U/L (ref 0–37)
Albumin: 3.9 g/dL (ref 3.5–5.2)
Alkaline Phosphatase: 68 U/L (ref 39–117)
Bilirubin, Direct: 0.2 mg/dL (ref 0.0–0.3)
Total Bilirubin: 0.5 mg/dL (ref 0.2–1.2)
Total Protein: 7.4 g/dL (ref 6.0–8.3)

## 2024-07-25 LAB — LIPID PANEL
Cholesterol: 139 mg/dL (ref 0–200)
HDL: 38.2 mg/dL — ABNORMAL LOW (ref >39.00)
LDL Cholesterol: 77 mg/dL (ref 0–99)
NonHDL: 101.01
Total CHOL/HDL Ratio: 4
Triglycerides: 122 mg/dL (ref 0.0–149.0)
VLDL: 24.4 mg/dL (ref 0.0–40.0)

## 2024-07-25 LAB — HEMOGLOBIN A1C: Hgb A1c MFr Bld: 6.2 % (ref 4.6–6.5)

## 2024-07-25 MED ORDER — METHYLPREDNISOLONE ACETATE 80 MG/ML IJ SUSP
80.0000 mg | Freq: Once | INTRAMUSCULAR | Status: AC
  Administered 2024-07-25: 80 mg via INTRAMUSCULAR

## 2024-07-25 NOTE — Assessment & Plan Note (Signed)
 BP Readings from Last 3 Encounters:  07/25/24 (!) 142/78  02/02/24 132/66  12/21/23 (!) 160/74   Uncontrolled, likely reactive due to pain, pt to continue medical treatment - zestoretic  20 12.5 every day, coreg  25 bid, norvasc  5 qd

## 2024-07-25 NOTE — Assessment & Plan Note (Addendum)
 Lab Results  Component Value Date   HGBA1C 6.2 07/25/2024   Stable, pt to continue current med tx - diet, wt control

## 2024-07-25 NOTE — Assessment & Plan Note (Signed)
 Lab Results  Component Value Date   LDLCALC 77 07/25/2024   Stable, pt to continue current statin crestor  10 mg qd

## 2024-07-25 NOTE — Progress Notes (Signed)
 Patient ID: Travis Carey, male   DOB: [birth year], 83 y.o.   MRN: [id number]        Chief Complaint: follow up left back and LLQ pain, allergies, low b12, dm, htn, hld       HPI:  Travis Carey is a 83 y.o. male here with c/o 2-3 days onset left back and side / LLQ pain.  Has been having some constipation but none in last few days.  Denies worsening reflux, , dysphagia, n/v, bowel change or blood.   Pt denies fever, wt loss, night sweats, loss of appetite, or other constitutional symptoms  Pt denies chest pain, increased sob or doe, wheezing, orthopnea, PND, increased LE swelling, palpitations, dizziness or syncope.   Pt denies polydipsia, polyuria, or new focal neuro s/s.   Does have several wks ongoing nasal allergy symptoms with clearish congestion, itch and sneezing, without fever, pain, ST, cough, swelling or wheezing.      Constipation sometimes but not today per pt.   BP controlled at home.  Had to struggle with helping wife to get here.  Stopped asa due to hemorrhoid bleeding now stopped.   Wt Readings from Last 3 Encounters:  07/25/24 189 lb (85.7 kg)  05/28/24 188 lb (85.3 kg)  02/29/24 188 lb (85.3 kg)   BP Readings from Last 3 Encounters:  07/25/24 (!) 142/78  02/02/24 132/66  12/21/23 (!) 160/74         Past Medical History:  Diagnosis Date   Allergic rhinitis, cause unspecified 01/31/2013   ANEMIA-NOS 01/28/2010   BPH (benign prostatic hypertrophy) 04/25/2013   CAD 02/17/2010   CAROTID BRUIT 11/18/2009   Carpal tunnel syndrome 05/20/2008   Cervical radiculitis 04/17/2012   CHEST PAIN 09/26/2009   DEPRESSION 06/12/2007   DYSPNEA 07/28/2010   ERECTILE DYSFUNCTION 02/24/2009   GERD 06/09/2007   GERD (gastroesophageal reflux disease)    GLUCOSE INTOLERANCE 01/28/2010   Gout 2017   Dr.Emerie Vanderkolk   HEMORRHOIDS 06/09/2007   History of colonic polyps 08/07/2015   HYPERLIPIDEMIA 02/07/2008   HYPERTENSION 06/09/2007   HYPOKALEMIA 01/28/2010   Impaired glucose tolerance 01/23/2011   LOW BACK PAIN  06/12/2007   MYOCARDIAL PERFUSION SCAN, WITH STRESS TEST, ABNORMAL 10/08/2009   PARESTHESIA 04/25/2008   SHOULDER PAIN, LEFT 04/25/2008   URI 08/19/2008   VITAMIN B12 DEFICIENCY 02/09/2010   Past Surgical History:  Procedure Laterality Date   COLONOSCOPY     CORONARY STENT PLACEMENT     from right radial    reports that he has never smoked. He has never used smokeless tobacco. He reports that he does not drink alcohol and does not use drugs. family history includes Alcohol abuse in his brother and sister; Coronary artery disease in an other family member; Heart attack in an other family member; Heart disease in his brother, father, and mother; Hyperlipidemia in his father and mother; Hypertension in his father, mother, and another family member; Stroke in an other family member. Allergies  Allergen Reactions   Diclofenac  Sodium     REACTION: maks pt drowsey   Current Outpatient Medications on File Prior to Visit  Medication Sig Dispense Refill   albuterol  (VENTOLIN  HFA) 108 (90 Base) MCG/ACT inhaler Inhale 2 puffs into the lungs every 6 (six) hours as needed for wheezing or shortness of breath. 8 g 5   amLODipine  (NORVASC ) 5 MG tablet Take 1 tablet by mouth once daily 90 tablet 0   aspirin  EC 81 MG tablet Take 81 mg by mouth  daily.     benzonatate  (TESSALON ) 100 MG capsule Take 1 capsule (100 mg total) by mouth 2 (two) times daily as needed for cough. 20 capsule 0   carvedilol  (COREG ) 25 MG tablet TAKE 1 TABLET BY MOUTH TWICE DAILY WITH A MEAL 180 tablet 0   diclofenac  sodium (VOLTAREN ) 1 % GEL Apply 2 g topically 4 (four) times daily as needed. 100 g 5   fexofenadine  (ALLEGRA ) 180 MG tablet Take 1 tablet (180 mg total) by mouth daily. As needed 90 tablet 3   fluticasone  (FLONASE ) 50 MCG/ACT nasal spray Place 2 sprays into both nostrils daily. 11.1 mL 2   furosemide  (LASIX ) 20 MG tablet with 3 potassium 90 tablet 3   gabapentin  (NEURONTIN ) 100 MG capsule Take 2 capsules (200 mg total) by  mouth 3 (three) times daily. 180 capsule 1   hydrocortisone  (ANUSOL -HC) 2.5 % rectal cream Place 1 Application rectally 2 (two) times daily. 30 g 1   lisinopril -hydrochlorothiazide  (ZESTORETIC ) 20-12.5 MG tablet Take 1 tablet by mouth daily. 90 tablet 0   MITIGARE  0.6 MG CAPS Take 1 capsule by mouth once daily 90 capsule 0   potassium chloride  (KLOR-CON ) 10 MEQ tablet Take 2 tablets (20 mEq total) by mouth daily. Take an extra tablet every other day when you take your furosemide  (Lasix ) 225 tablet 2   predniSONE  (DELTASONE ) 20 MG tablet Take 2 tablets (40 mg total) by mouth daily with breakfast. 10 tablet 0   rosuvastatin  (CRESTOR ) 10 MG tablet Take 1 tablet by mouth once daily 90 tablet 0   tamsulosin  (FLOMAX ) 0.4 MG CAPS capsule Take 1 capsule by mouth once daily 90 capsule 3   traMADol  (ULTRAM ) 50 MG tablet TAKE 1 TABLET BY MOUTH EVERY 6 HOURS AS NEEDED (Patient not taking: Reported on 05/28/2024) 60 tablet 2   No current facility-administered medications on file prior to visit.        ROS:  All others reviewed and negative.  Objective        PE:  BP (!) 142/78 (BP Location: Right Arm, Patient Position: Sitting, Cuff Size: Normal)   Pulse 60   Temp 98.2 F (36.8 C) (Oral)   Ht 5' 8 (1.727 m)   Wt 189 lb (85.7 kg)   SpO2 99%   BMI 28.74 kg/m                 Constitutional: Pt appears in NAD               HENT: Head: NCAT.                Right Ear: External ear normal.                 Left Ear: External ear normal.                Eyes: . Pupils are equal, round, and reactive to light. Conjunctivae and EOM are normal               Nose: without d/c or deformity               Neck: Neck supple. Gross normal ROM               Cardiovascular: Normal rate and regular rhythm.                 Pulmonary/Chest: Effort normal and breath sounds without rales or wheezing.  Abd:  Soft, mild LLQ tender, no guarding, ND, + BS, no organomegaly               Neurological: Pt is  alert. At baseline orientation, motor grossly intact               Skin: Skin is warm. No rashes, no other new lesions, LE edema - none               Psychiatric: Pt behavior is normal without agitation   Micro: none  Cardiac tracings I have personally interpreted today:  none  Pertinent Radiological findings (summarize): none   Lab Results  Component Value Date   WBC 5.8 07/25/2024   HGB 12.8 (L) 07/25/2024   HCT 38.9 (L) 07/25/2024   PLT 216.0 07/25/2024   GLUCOSE 83 07/25/2024   CHOL 139 07/25/2024   TRIG 122.0 07/25/2024   HDL 38.20 (L) 07/25/2024   LDLDIRECT 58.0 04/26/2022   LDLCALC 77 07/25/2024   ALT 12 07/25/2024   AST 20 07/25/2024   NA 142 07/25/2024   K 3.7 07/25/2024   CL 105 07/25/2024   CREATININE 1.12 07/25/2024   BUN 14 07/25/2024   CO2 31 07/25/2024   TSH 2.71 10/18/2023   PSA 0.38 04/27/2021   INR 1.0 ratio 10/23/2009   HGBA1C 6.2 07/25/2024   Assessment/Plan:  RESHAWN OSTLUND is a 83 y.o. Black or African American [2] male with  has a past medical history of Allergic rhinitis, cause unspecified (01/31/2013), ANEMIA-NOS (01/28/2010), BPH (benign prostatic hypertrophy) (04/25/2013), CAD (02/17/2010), CAROTID BRUIT (11/18/2009), Carpal tunnel syndrome (05/20/2008), Cervical radiculitis (04/17/2012), CHEST PAIN (09/26/2009), DEPRESSION (06/12/2007), DYSPNEA (07/28/2010), ERECTILE DYSFUNCTION (02/24/2009), GERD (06/09/2007), GERD (gastroesophageal reflux disease), GLUCOSE INTOLERANCE (01/28/2010), Gout (2017), HEMORRHOIDS (06/09/2007), History of colonic polyps (08/07/2015), HYPERLIPIDEMIA (02/07/2008), HYPERTENSION (06/09/2007), HYPOKALEMIA (01/28/2010), Impaired glucose tolerance (01/23/2011), LOW BACK PAIN (06/12/2007), MYOCARDIAL PERFUSION SCAN, WITH STRESS TEST, ABNORMAL (10/08/2009), PARESTHESIA (04/25/2008), SHOULDER PAIN, LEFT (04/25/2008), URI (08/19/2008), and VITAMIN B12 DEFICIENCY (02/09/2010).  Allergic rhinitis Mild to mod, for depomedrol 80 mg IM, continue allegra  and add otc  nasacort asd,  to f/u any worsening symptoms or concerns   B12 deficiency Lab Results  Component Value Date   VITAMINB12 >1537 (H) 10/18/2023   Stable, cont oral replacement - b12 1000 mcg qd   Diabetes (HCC) Lab Results  Component Value Date   HGBA1C 6.2 07/25/2024   Stable, pt to continue current med tx - diet, wt control   Hyperlipidemia Lab Results  Component Value Date   LDLCALC 77 07/25/2024   Stable, pt to continue current statin crestor  10 mg qd   Hypertension BP Readings from Last 3 Encounters:  07/25/24 (!) 142/78  02/02/24 132/66  12/21/23 (!) 160/74   Uncontrolled, likely reactive due to pain, pt to continue medical treatment - zestoretic  20 12.5 every day, coreg  25 bid, norvasc  5 qd   Vitamin D  deficiency Last vitamin D  Lab Results  Component Value Date   VD25OH 52.57 10/18/2023   Stable, cont oral replacement   LLQ pain Etiology unclear, pain less intense now mild today, declines med tx but will need UA and CT renal r/o stone vs other  Followup: Return in about 6 months (around 01/23/2025).  Lynwood Rush, MD 07/25/2024 8:03 PM [facility]

## 2024-07-25 NOTE — Assessment & Plan Note (Signed)
 Lab Results  Component Value Date   VITAMINB12 >1537 (H) 10/18/2023   Stable, cont oral replacement - b12 1000 mcg qd

## 2024-07-25 NOTE — Assessment & Plan Note (Signed)
 Last vitamin D  Lab Results  Component Value Date   VD25OH 52.57 10/18/2023   Stable, cont oral replacement

## 2024-07-25 NOTE — Assessment & Plan Note (Signed)
 Etiology unclear, pain less intense now mild today, declines med tx but will need UA and CT renal r/o stone vs other

## 2024-07-25 NOTE — Patient Instructions (Addendum)
 You had the steroid shot today  Ok to start the OTC Nasacort for allergies and continue the allegra   Please continue all other medications as before, and refills have been done if requested.  Please have the pharmacy call with any other refills you may need.  Please continue your efforts at being more active, low cholesterol diet, and weight control.  Please keep your appointments with your specialists as you may have planned  You will be contacted regarding the referral for: CT renal   Please go to the LAB at the blood drawing area for the tests to be done  You will be contacted by phone if any changes need to be made immediately.  Otherwise, you will receive a letter about your results with an explanation, but please check with MyChart first.  Please make an Appointment to return in 6 months, or sooner if needed

## 2024-07-25 NOTE — Assessment & Plan Note (Signed)
 Mild to mod, for depomedrol 80 mg IM, continue allegra  and add otc nasacort asd,  to f/u any worsening symptoms or concerns

## 2024-07-27 ENCOUNTER — Other Ambulatory Visit

## 2024-07-29 ENCOUNTER — Other Ambulatory Visit: Payer: Self-pay | Admitting: Internal Medicine

## 2024-07-30 ENCOUNTER — Encounter: Payer: Self-pay | Admitting: Internal Medicine

## 2024-07-30 ENCOUNTER — Other Ambulatory Visit: Payer: Self-pay

## 2024-07-30 ENCOUNTER — Ambulatory Visit
Admission: RE | Admit: 2024-07-30 | Discharge: 2024-07-30 | Disposition: A | Discharge status: Home / Self Care | Source: Ambulatory Visit | Attending: Internal Medicine | Admitting: Internal Medicine

## 2024-07-30 DIAGNOSIS — R1030 Lower abdominal pain, unspecified: Secondary | ICD-10-CM | POA: Insufficient documentation

## 2024-07-30 DIAGNOSIS — I7143 Infrarenal abdominal aortic aneurysm, without rupture: Secondary | ICD-10-CM | POA: Diagnosis not present

## 2024-07-30 MED ORDER — IOHEXOL 300 MG/ML  SOLN: 100.0000 mL | Freq: Once | INTRAMUSCULAR | Status: DC | PRN

## 2024-07-31 ENCOUNTER — Ambulatory Visit

## 2024-08-01 ENCOUNTER — Other Ambulatory Visit: Payer: Self-pay

## 2024-08-01 ENCOUNTER — Other Ambulatory Visit: Payer: Self-pay | Admitting: Internal Medicine

## 2024-08-09 ENCOUNTER — Telehealth: Payer: Self-pay | Admitting: Cardiology

## 2024-08-10 ENCOUNTER — Other Ambulatory Visit: Payer: Self-pay | Admitting: Cardiology

## 2024-08-10 ENCOUNTER — Other Ambulatory Visit: Payer: Self-pay | Admitting: Internal Medicine

## 2024-08-10 ENCOUNTER — Other Ambulatory Visit: Payer: Self-pay

## 2024-09-13 ENCOUNTER — Other Ambulatory Visit: Payer: Self-pay | Admitting: Internal Medicine

## 2024-09-13 ENCOUNTER — Other Ambulatory Visit: Payer: Self-pay

## 2024-09-14 ENCOUNTER — Other Ambulatory Visit: Payer: Self-pay | Admitting: Internal Medicine

## 2024-09-15 ENCOUNTER — Other Ambulatory Visit: Payer: Self-pay | Admitting: Internal Medicine

## 2024-09-18 NOTE — Telephone Encounter (Signed)
 Patient's wife is following up regarding this request. She says patient is completely out of medication. Please advise.

## 2024-09-19 ENCOUNTER — Other Ambulatory Visit: Payer: Self-pay | Admitting: Cardiovascular Disease

## 2024-09-20 ENCOUNTER — Other Ambulatory Visit: Payer: Self-pay | Admitting: Emergency Medicine

## 2024-09-20 MED ORDER — FUROSEMIDE 20 MG PO TABS: ORAL_TABLET | ORAL | 0 refills | Status: AC

## 2024-09-20 MED ORDER — POTASSIUM CHLORIDE ER 10 MEQ PO TBCR: EXTENDED_RELEASE_TABLET | ORAL | 0 refills | Status: AC

## 2024-09-20 NOTE — Telephone Encounter (Signed)
 Called patient and left message for call back.

## 2024-09-20 NOTE — Telephone Encounter (Signed)
 Refills sent to preferred pharmacy.

## 2024-10-21 NOTE — Progress Notes (Unsigned)
 Cardiology Office Note  Date:  10/23/2024   ID:  Travis Carey, DOB [birth year], MRN [id number]  PCP:  Travis Lynwood ORN, MD   Chief Complaint  Patient presents with   Follow-up    Patient c/o shortness of breath, elevated blood pressure in the afternoon and bilateral LE edema.     HPI:  Travis Carey  is a 84 year old gentleman with past medical history of hyperlipidemia  CAD H/o unstable angina with stenting of the RCA on 10/31/2009.   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  Last seen by myself in clinic 6/24 Last seen by one of our providers August 2024  In follow-up today reports feeling relatively well Has diffuse arthritis, slow to get around  Trouble with polyuria, drinks a lot of fluid daily  No swelling in his legs Takes lasix  daily which has helped his leg swelling Feels his breathing is stable  Echo 6/24: EF 60%, moderate LVH, moderate MR,   Lab work reviewed A1c 6.2 Total cholesterol 139, LDL 77 Normal BMP  EKG personally reviewed by myself on todays visit EKG Interpretation Date/Time:  Tuesday October 23 2024 15:44:06 EST Ventricular Rate:  60 PR Interval:  294 QRS Duration:  148 QT Interval:  472 QTC Calculation: 472 R Axis:   -78  Text Interpretation: Sinus rhythm with 1st degree A-V block with Premature ventricular complexes or Fusion complexes Right bundle branch block Left anterior fascicular block Bifascicular block When compared with ECG of 04-Dec-2021 15:28, Fusion complexes are now Present Premature ventricular complexes are now Present T wave inversion no longer evident in Inferior leads T wave inversion less evident in Lateral leads Confirmed by Travis Carey [phone])  on 10/23/2024 4:10:00 PM   Stress test May 2023 low risk study No significant ischemia  Stress test 5/23 No significant ischemia, low normal ejection fraction There is coronary calcification noted Low risk study  Pain in legs, ABIs normal currently a non smoker, wife smokes  EKG personally reviewed by myself on todays visit Normal sinus rhythm rate 62 bpm poor R wave progression to the anterior precordial leads, left anterior fascicular block, intraventricular conduction delay  Off plavix , kept getting bleeding in eye On asa  Previous stroke symptoms, evaluated in the hospital in June 2016 He had slurred speech, difficulty swallowing  Echocardiogram was essentially normal   PMH:   has a past medical history of Allergic rhinitis, cause unspecified (01/31/2013), ANEMIA-NOS (01/28/2010), BPH (benign prostatic hypertrophy) (04/25/2013), CAD (02/17/2010), CAROTID BRUIT (11/18/2009), Carpal tunnel syndrome (05/20/2008), Cervical radiculitis (04/17/2012), CHEST PAIN (09/26/2009), DEPRESSION (06/12/2007), DYSPNEA (07/28/2010), ERECTILE DYSFUNCTION (02/24/2009), GERD (06/09/2007), GERD (gastroesophageal reflux disease), GLUCOSE INTOLERANCE (01/28/2010), Gout (2017), HEMORRHOIDS (06/09/2007), History of colonic polyps (08/07/2015), HYPERLIPIDEMIA (02/07/2008), HYPERTENSION (06/09/2007), HYPOKALEMIA (01/28/2010), Impaired glucose tolerance (01/23/2011), LOW BACK PAIN (06/12/2007), MYOCARDIAL PERFUSION SCAN, WITH STRESS TEST, ABNORMAL (10/08/2009), PARESTHESIA (04/25/2008), SHOULDER PAIN, LEFT (04/25/2008), URI (08/19/2008), and VITAMIN B12 DEFICIENCY (02/09/2010).  PSH:    Past Surgical History:  Procedure Laterality Date   COLONOSCOPY     CORONARY STENT PLACEMENT     from right radial    Current Outpatient Medications  Medication Sig Dispense Refill   albuterol  (VENTOLIN  HFA) 108 (90 Base) MCG/ACT inhaler Inhale 2 puffs into the lungs every  6 (six) hours as needed for wheezing or shortness of breath. 8 g 5    amLODipine  (NORVASC ) 5 MG tablet Take 1 tablet by mouth once daily 90 tablet 0   benzonatate  (TESSALON ) 100 MG capsule Take 1 capsule (100 mg total) by mouth 2 (two) times daily as needed for cough. 20 capsule 0   carvedilol  (COREG ) 25 MG tablet TAKE 1 TABLET BY MOUTH TWICE DAILY WITH A MEAL 180 tablet 0   diclofenac  sodium (VOLTAREN ) 1 % GEL Apply 2 g topically 4 (four) times daily as needed. 100 g 5   fexofenadine  (ALLEGRA ) 180 MG tablet Take 1 tablet (180 mg total) by mouth daily. As needed 90 tablet 3   fluticasone  (FLONASE ) 50 MCG/ACT nasal spray Place 2 sprays into both nostrils daily. 11.1 mL 2   furosemide  (LASIX ) 20 MG tablet TAKE 1 TABLET BY MOUTH ONCE DAILY ALONG WITH 3 POTASSIUM TABLETS 90 tablet 0   gabapentin  (NEURONTIN ) 100 MG capsule Take 2 capsules (200 mg total) by mouth 3 (three) times daily. 180 capsule 1   hydrocortisone  (ANUSOL -HC) 2.5 % rectal cream Place 1 Application rectally 2 (two) times daily. 30 g 1   lisinopril -hydrochlorothiazide  (ZESTORETIC ) 20-12.5 MG tablet Take 1 tablet by mouth once daily 90 tablet 0   MITIGARE  0.6 MG CAPS Take 1 capsule by mouth once daily 90 capsule 0   potassium chloride  (KLOR-CON ) 10 MEQ tablet TAKE TWO TABLETS BY MOUTH DAILY. TAKE AN EXTRA TABLET EVERY OTHER DAY WHEN YOU TAKE YOUR FUROSEMIDE  (LASIX ) 235 tablet 0   rosuvastatin  (CRESTOR ) 10 MG tablet Take 1 tablet by mouth once daily 90 tablet 0   tamsulosin  (FLOMAX ) 0.4 MG CAPS capsule Take 1 capsule by mouth once daily 90 capsule 0   traMADol  (ULTRAM ) 50 MG tablet TAKE 1 TABLET BY MOUTH EVERY 6 HOURS AS NEEDED 60 tablet 1   predniSONE  (DELTASONE ) 20 MG tablet Take 2 tablets (40 mg total) by mouth daily with breakfast. (Patient not taking: Reported on 10/23/2024) 10 tablet 0   No current facility-administered medications for this visit.     Allergies:   Diclofenac  sodium and Diclofenac  sodium   Social History:  The patient  reports that he has never smoked. He has never used smokeless  tobacco. He reports that he does not drink alcohol and does not use drugs.   Family History:   family history includes Alcohol abuse in his brother and sister; Coronary artery disease in an other family member; Heart attack in an other family member; Heart disease in his brother, father, and mother; Hyperlipidemia in his father and mother; Hypertension in his father, mother, and another family member; Stroke in an other family member.    Review of Systems: Review of Systems  Constitutional: Negative.   HENT: Negative.    Respiratory:  Positive for cough and shortness of breath.   Cardiovascular: Negative.   Gastrointestinal: Negative.   Musculoskeletal: Negative.   Neurological: Negative.   Psychiatric/Behavioral: Negative.    All other systems reviewed and are negative.    PHYSICAL EXAM: VS:  BP (!) 150/70 (BP Location: Left Arm, Patient Position: Sitting, Cuff Size: Normal)   Pulse 60   Ht 5' 8 (1.727 m)   Wt 186 lb 4 oz (84.5 kg)   SpO2 96%   BMI 28.32 kg/m  , BMI Body mass index is 28.32 kg/m. Constitutional:  oriented to person, place, and time. No distress.  HENT:  Head: Normocephalic and atraumatic.  Eyes:  no discharge. No  scleral icterus.  Neck: Normal range of motion. Neck supple. No JVD present.  Cardiovascular: Normal rate, regular rhythm, normal heart sounds and intact distal pulses. Exam reveals no gallop and no friction rub. No edema No murmur heard. Pulmonary/Chest: Effort normal and breath sounds normal. No stridor. No respiratory distress.  no wheezes.  no rales.  no tenderness.  Abdominal: Soft.  no distension.  no tenderness.  Musculoskeletal: Normal range of motion.  no  tenderness or deformity.  Neurological:  normal muscle tone. Coordination normal. No atrophy Skin: Skin is warm and dry. No rash noted. not diaphoretic.  Psychiatric:  normal mood and affect. behavior is normal. Thought content normal.     Recent Labs: 07/25/2024: ALT 12; BUN 14;  Creatinine, Ser 1.12; Hemoglobin 12.8; Platelets 216.0; Potassium 3.7; Sodium 142    Lipid Panel Lab Results  Component Value Date   CHOL 139 07/25/2024   HDL 38.20 (L) 07/25/2024   LDLCALC 77 07/25/2024   TRIG 122.0 07/25/2024    Wt Readings from Last 3 Encounters:  10/23/24 186 lb 4 oz (84.5 kg)  07/25/24 189 lb (85.7 kg)  05/28/24 188 lb (85.3 kg)     ASSESSMENT AND PLAN:  Atherosclerosis of native coronary artery of native heart with stable angina pectoris (HCC) -  Myoview  with no significant ischemia May 2023 Currently with no symptoms of angina. No further workup at this time. Continue current medication regimen.  Cholesterol at goal  Claudication/leg pain Normal ABIs Reports he quit smoking, cholesterol at goal Recommend walking program  Essential hypertension - Plan: EKG 12-Lead Blood pressure elevated in the office, he reports it is well-controlled at home Recommend close monitoring of blood pressure at home and call us  if numbers run high  Chronic diastolic CHF - We have suggested he stay on Lasix  20 mg daily, potassium 10 mill equivalents 2 pills daily  Mixed hyperlipidemia - Plan: EKG 12-Lead Cholesterol is at goal on the current lipid regimen. No changes to the medications were made.  Other emphysema (HCC) - Plan: EKG 12-Lead Reports he has stopped smoking Wife continues to smoke Breathing stable  Gastroesophageal reflux disease without esophagitis On PPI   Orders Placed This Encounter  Procedures   EKG 12-Lead     Signed, Velinda Lunger, M.D., Ph.D. 10/23/2024  [facility] [address] [place], [place] [phone]

## 2024-10-23 ENCOUNTER — Ambulatory Visit: Attending: Cardiovascular Disease | Admitting: Cardiovascular Disease

## 2024-10-23 ENCOUNTER — Other Ambulatory Visit: Payer: Self-pay | Admitting: Internal Medicine

## 2024-10-23 ENCOUNTER — Encounter: Payer: Self-pay | Admitting: Cardiovascular Disease

## 2024-10-23 ENCOUNTER — Telehealth: Payer: Self-pay

## 2024-10-23 VITALS — BP 150/70 | HR 60 | Ht 68.0 in | Wt 186.2 lb

## 2024-10-23 DIAGNOSIS — I10 Essential (primary) hypertension: Secondary | ICD-10-CM | POA: Diagnosis not present

## 2024-10-23 DIAGNOSIS — I739 Peripheral vascular disease, unspecified: Secondary | ICD-10-CM | POA: Diagnosis not present

## 2024-10-23 DIAGNOSIS — E782 Mixed hyperlipidemia: Secondary | ICD-10-CM | POA: Diagnosis not present

## 2024-10-23 DIAGNOSIS — J441 Chronic obstructive pulmonary disease with (acute) exacerbation: Secondary | ICD-10-CM | POA: Diagnosis not present

## 2024-10-23 DIAGNOSIS — I25118 Atherosclerotic heart disease of native coronary artery with other forms of angina pectoris: Secondary | ICD-10-CM | POA: Diagnosis not present

## 2024-10-23 DIAGNOSIS — I6523 Occlusion and stenosis of bilateral carotid arteries: Secondary | ICD-10-CM

## 2024-10-23 MED ORDER — AMLODIPINE BESYLATE 5 MG PO TABS: 5.0000 mg | ORAL_TABLET | Freq: Every day | ORAL | 3 refills | Status: DC

## 2024-10-23 NOTE — Patient Instructions (Addendum)
 Medication Instructions:  Your physician recommends that you continue on your current medications as directed. Please refer to the Current Medication list given to you today.   If you need a refill on your cardiac medications before your next appointment, please call your pharmacy.   Lab work:No labs ordered today    Testing/Procedures: No test ordered today   Follow-Up: At [facility], you and your health needs are our priority.  As part of our continuing mission to provide you with exceptional heart care, we have created designated Provider Care Teams.  These Care Teams include your primary Cardiologist (physician) and Advanced Practice Providers (APPs -  Physician Assistants and Nurse Practitioners) who all work together to provide you with the care you need, when you need it.  You will need a follow up appointment in 12 months  Providers on your designated Care Team:   Lonni Meager, NP Bernardino Bring, PA-C Cadence [place], [place]  COVID-19 Vaccine Information can be found at: [email or web address] For questions related to vaccine distribution or appointments, please email [email or web address] or call [phone].

## 2024-10-23 NOTE — Telephone Encounter (Signed)
 Copied from CRM [id number]. Topic: General - Other >> Oct 23, 2024 12:04 PM Thersia BROCKS wrote: Reason for CRM: Patient called in and would like to know what is the procedure bee with medication refills since they will not be able to see the new provider until July . Would like a callback

## 2024-10-25 MED ORDER — CARVEDILOL 25 MG PO TABS: 25.0000 mg | ORAL_TABLET | Freq: Two times a day (BID) | ORAL | 1 refills | Status: AC

## 2024-10-25 MED ORDER — ROSUVASTATIN CALCIUM 10 MG PO TABS: 10.0000 mg | ORAL_TABLET | Freq: Every day | ORAL | 1 refills | Status: AC

## 2024-10-25 MED ORDER — TAMSULOSIN HCL 0.4 MG PO CAPS: 0.4000 mg | ORAL_CAPSULE | Freq: Every day | ORAL | 1 refills | Status: AC

## 2024-10-25 MED ORDER — LISINOPRIL-HYDROCHLOROTHIAZIDE 20-12.5 MG PO TABS: 1.0000 | ORAL_TABLET | Freq: Every day | ORAL | 1 refills | Status: AC

## 2024-10-25 MED ORDER — GABAPENTIN 100 MG PO CAPS: 200.0000 mg | ORAL_CAPSULE | Freq: Three times a day (TID) | ORAL | 1 refills | Status: AC

## 2024-10-25 MED ORDER — AMLODIPINE BESYLATE 5 MG PO TABS: 5.0000 mg | ORAL_TABLET | Freq: Every day | ORAL | 1 refills | Status: AC

## 2024-10-25 NOTE — Addendum Note (Signed)
 Addended by: NORLEEN LYNWOOD ORN on: 10/25/2024 12:11 PM   Modules accepted: Orders

## 2024-10-25 NOTE — Telephone Encounter (Signed)
 Ok I have refilled all of his usual daily medications for 6 mo, so he will not run out, and give more time to see a new provider    Please remember there are 3 Nurse Practitioners in the office here who are taking new patients   Thanks

## 2024-10-26 NOTE — Telephone Encounter (Signed)
 Please inform the pt of the following per Dr. Norleen as follows Ok I have refilled all of his usual daily medications for 6 mo, so he will not run out, and give more time to see a new provider Please remember there are 3 Nurse Practitioners in the office here who are taking new patients Thanks

## 2024-11-02 ENCOUNTER — Encounter: Payer: Self-pay | Admitting: Internal Medicine

## 2025-04-17 ENCOUNTER — Encounter: Admitting: Family Medicine

## 2025-04-23 ENCOUNTER — Encounter: Admitting: Family Medicine

## 2025-05-30 ENCOUNTER — Ambulatory Visit
